# Patient Record
Sex: Male | Born: 1945 | Race: Black or African American | Hispanic: No | Marital: Married | Smoking: Former smoker
Health system: Southern US, Community
[De-identification: ages and names within clinical notes are randomized; demographics above are authoritative.]

## PROBLEM LIST (undated history)

## (undated) DIAGNOSIS — E782 Mixed hyperlipidemia: Secondary | ICD-10-CM

## (undated) DIAGNOSIS — J40 Bronchitis, not specified as acute or chronic: Secondary | ICD-10-CM

## (undated) DIAGNOSIS — Q2112 Patent foramen ovale: Secondary | ICD-10-CM

## (undated) DIAGNOSIS — Z860100 Personal history of colon polyps, unspecified: Secondary | ICD-10-CM

## (undated) DIAGNOSIS — E119 Type 2 diabetes mellitus without complications: Secondary | ICD-10-CM

## (undated) DIAGNOSIS — I513 Intracardiac thrombosis, not elsewhere classified: Secondary | ICD-10-CM

## (undated) DIAGNOSIS — I10 Essential (primary) hypertension: Secondary | ICD-10-CM

## (undated) DIAGNOSIS — I639 Cerebral infarction, unspecified: Secondary | ICD-10-CM

## (undated) DIAGNOSIS — G4733 Obstructive sleep apnea (adult) (pediatric): Secondary | ICD-10-CM

## (undated) DIAGNOSIS — N529 Male erectile dysfunction, unspecified: Secondary | ICD-10-CM

## (undated) DIAGNOSIS — I219 Acute myocardial infarction, unspecified: Secondary | ICD-10-CM

## (undated) DIAGNOSIS — K219 Gastro-esophageal reflux disease without esophagitis: Secondary | ICD-10-CM

## (undated) DIAGNOSIS — I809 Phlebitis and thrombophlebitis of unspecified site: Secondary | ICD-10-CM

## (undated) DIAGNOSIS — R0600 Dyspnea, unspecified: Secondary | ICD-10-CM

## (undated) DIAGNOSIS — Q211 Atrial septal defect: Secondary | ICD-10-CM

## (undated) HISTORY — DX: Essential (primary) hypertension: I10

## (undated) HISTORY — DX: Personal history of colon polyps, unspecified: Z86.0100

## (undated) HISTORY — DX: Bronchitis, not specified as acute or chronic: J40

## (undated) HISTORY — DX: Gastro-esophageal reflux disease without esophagitis: K21.9

## (undated) HISTORY — DX: Type 2 diabetes mellitus without complications: E11.9

## (undated) HISTORY — DX: Obstructive sleep apnea (adult) (pediatric): G47.33

## (undated) HISTORY — DX: Phlebitis and thrombophlebitis of unspecified site: I80.9

## (undated) HISTORY — DX: Cerebral infarction, unspecified: I63.9

## (undated) HISTORY — DX: Mixed hyperlipidemia: E78.2

## (undated) HISTORY — DX: Patent foramen ovale: Q21.12

## (undated) HISTORY — DX: Male erectile dysfunction, unspecified: N52.9

## (undated) HISTORY — PX: NO PAST SURGERIES: SHX2092

## (undated) HISTORY — DX: Intracardiac thrombosis, not elsewhere classified: I51.3

## (undated) HISTORY — DX: Personal history of colonic polyps: Z86.010

## (undated) HISTORY — DX: Atrial septal defect: Q21.1

---

## 2003-12-18 ENCOUNTER — Emergency Department (HOSPITAL_COMMUNITY): Admission: EM | Admit: 2003-12-18 | Discharge: 2003-12-19 | Payer: Self-pay | Admitting: Emergency Medicine

## 2003-12-22 ENCOUNTER — Ambulatory Visit (HOSPITAL_COMMUNITY): Admission: RE | Admit: 2003-12-22 | Discharge: 2003-12-22 | Payer: Self-pay | Admitting: General Surgery

## 2005-02-01 ENCOUNTER — Emergency Department (HOSPITAL_COMMUNITY): Admission: EM | Admit: 2005-02-01 | Discharge: 2005-02-02 | Payer: Self-pay | Admitting: Emergency Medicine

## 2005-02-07 ENCOUNTER — Ambulatory Visit: Payer: Self-pay | Admitting: Family Medicine

## 2005-02-07 ENCOUNTER — Ambulatory Visit (HOSPITAL_COMMUNITY): Admission: RE | Admit: 2005-02-07 | Discharge: 2005-02-07 | Payer: Self-pay | Admitting: Family Medicine

## 2005-02-13 ENCOUNTER — Ambulatory Visit: Payer: Self-pay | Admitting: Family Medicine

## 2005-02-13 ENCOUNTER — Ambulatory Visit (HOSPITAL_COMMUNITY): Admission: RE | Admit: 2005-02-13 | Discharge: 2005-02-13 | Payer: Self-pay | Admitting: Family Medicine

## 2005-02-21 ENCOUNTER — Ambulatory Visit: Payer: Self-pay | Admitting: Family Medicine

## 2005-03-14 ENCOUNTER — Ambulatory Visit: Payer: Self-pay | Admitting: Family Medicine

## 2005-03-14 ENCOUNTER — Ambulatory Visit (HOSPITAL_COMMUNITY): Admission: RE | Admit: 2005-03-14 | Discharge: 2005-03-14 | Payer: Self-pay | Admitting: Family Medicine

## 2005-05-14 ENCOUNTER — Ambulatory Visit: Payer: Self-pay | Admitting: Family Medicine

## 2006-02-26 ENCOUNTER — Encounter (INDEPENDENT_AMBULATORY_CARE_PROVIDER_SITE_OTHER): Payer: Self-pay | Admitting: Internal Medicine

## 2006-04-28 ENCOUNTER — Encounter (INDEPENDENT_AMBULATORY_CARE_PROVIDER_SITE_OTHER): Payer: Self-pay | Admitting: Internal Medicine

## 2006-04-29 ENCOUNTER — Ambulatory Visit: Payer: Self-pay | Admitting: Internal Medicine

## 2006-04-29 LAB — CONVERTED CEMR LAB
BUN: 16 mg/dL
Basophils Absolute: 0 10*3/uL
Basophils Relative: 1 %
Creatinine, Ser: 1.17 mg/dL
Eosinophils Absolute: 0.2 10*3/uL
Glucose, Bld: 94 mg/dL
HDL: 29 mg/dL
LDL Cholesterol: 93 mg/dL
Lymphocytes Relative: 36 %
MCHC: 32.7 g/dL
MCV: 86.3 fL
Monocytes Absolute: 0.6 10*3/uL
Monocytes Relative: 7 %
Neutrophils Relative %: 54 %
RBC: 5.25 M/uL
TSH: 1.884 microintl units/mL
Total Bilirubin: 0.6 mg/dL
Total Protein: 7.6 g/dL
VLDL: 31 mg/dL

## 2006-05-27 ENCOUNTER — Ambulatory Visit: Payer: Self-pay | Admitting: Internal Medicine

## 2006-06-07 ENCOUNTER — Ambulatory Visit: Admission: RE | Admit: 2006-06-07 | Discharge: 2006-06-07 | Payer: Self-pay | Admitting: Internal Medicine

## 2006-06-12 ENCOUNTER — Ambulatory Visit: Payer: Self-pay | Admitting: Pulmonary Disease

## 2006-07-01 DIAGNOSIS — I10 Essential (primary) hypertension: Secondary | ICD-10-CM

## 2006-07-01 DIAGNOSIS — Z86718 Personal history of other venous thrombosis and embolism: Secondary | ICD-10-CM

## 2006-07-01 DIAGNOSIS — E782 Mixed hyperlipidemia: Secondary | ICD-10-CM | POA: Insufficient documentation

## 2006-07-01 DIAGNOSIS — E1149 Type 2 diabetes mellitus with other diabetic neurological complication: Secondary | ICD-10-CM | POA: Insufficient documentation

## 2006-07-03 ENCOUNTER — Encounter (INDEPENDENT_AMBULATORY_CARE_PROVIDER_SITE_OTHER): Payer: Self-pay | Admitting: Internal Medicine

## 2006-07-22 ENCOUNTER — Ambulatory Visit: Payer: Self-pay | Admitting: Internal Medicine

## 2006-07-22 LAB — CONVERTED CEMR LAB
Microalb Creat Ratio: 3.3 mg/g
Microalb, Ur: 0.29 mg/dL

## 2006-07-23 ENCOUNTER — Encounter (INDEPENDENT_AMBULATORY_CARE_PROVIDER_SITE_OTHER): Payer: Self-pay | Admitting: Internal Medicine

## 2006-07-30 ENCOUNTER — Encounter (INDEPENDENT_AMBULATORY_CARE_PROVIDER_SITE_OTHER): Payer: Self-pay | Admitting: Internal Medicine

## 2006-09-15 ENCOUNTER — Encounter (INDEPENDENT_AMBULATORY_CARE_PROVIDER_SITE_OTHER): Payer: Self-pay | Admitting: Internal Medicine

## 2006-10-21 ENCOUNTER — Ambulatory Visit: Payer: Self-pay | Admitting: Internal Medicine

## 2006-10-21 DIAGNOSIS — F528 Other sexual dysfunction not due to a substance or known physiological condition: Secondary | ICD-10-CM

## 2006-10-22 LAB — CONVERTED CEMR LAB
ALT: 27 U/L
AST: 19 U/L
Albumin: 4.5 g/dL
Alkaline Phosphatase: 46 U/L
BUN: 19 mg/dL
Basophils Absolute: 0.1 10*3/uL
Basophils Relative: 1 %
CO2: 22 meq/L
Calcium: 9.6 mg/dL
Chloride: 104 meq/L
Cholesterol: 139 mg/dL
Creatinine, Ser: 1.15 mg/dL
Eosinophils Absolute: 0.2 10*3/uL
Eosinophils Relative: 2 %
Glucose, Bld: 98 mg/dL
HCT: 44.9 %
HDL: 30 mg/dL — ABNORMAL LOW
Hemoglobin: 14.2 g/dL
LDL Cholesterol: 73 mg/dL
Lymphocytes Relative: 33 %
Lymphs Abs: 2.7 10*3/uL
MCHC: 31.6 g/dL
MCV: 88.6 fL
Monocytes Absolute: 0.6 10*3/uL
Monocytes Relative: 7 %
Neutro Abs: 4.7 10*3/uL
Neutrophils Relative %: 57 %
PSA: 0.98 ng/mL
Platelets: 223 10*3/uL
Potassium: 4.1 meq/L
RBC: 5.07 M/uL
RDW: 13 %
Sodium: 138 meq/L
Total Bilirubin: 0.5 mg/dL
Total CHOL/HDL Ratio: 4.6
Total Protein: 7.5 g/dL
Triglycerides: 178 mg/dL — ABNORMAL HIGH
VLDL: 36 mg/dL
WBC: 8.2 10*3/uL

## 2007-01-20 ENCOUNTER — Ambulatory Visit: Payer: Self-pay | Admitting: Internal Medicine

## 2007-01-20 ENCOUNTER — Encounter (INDEPENDENT_AMBULATORY_CARE_PROVIDER_SITE_OTHER): Payer: Self-pay | Admitting: *Deleted

## 2007-03-11 ENCOUNTER — Encounter (INDEPENDENT_AMBULATORY_CARE_PROVIDER_SITE_OTHER): Payer: Self-pay | Admitting: Internal Medicine

## 2007-03-12 ENCOUNTER — Encounter (INDEPENDENT_AMBULATORY_CARE_PROVIDER_SITE_OTHER): Payer: Self-pay | Admitting: Internal Medicine

## 2007-04-21 ENCOUNTER — Ambulatory Visit: Payer: Self-pay | Admitting: Internal Medicine

## 2007-04-22 ENCOUNTER — Telehealth (INDEPENDENT_AMBULATORY_CARE_PROVIDER_SITE_OTHER): Payer: Self-pay | Admitting: *Deleted

## 2007-04-27 ENCOUNTER — Ambulatory Visit: Payer: Self-pay | Admitting: Internal Medicine

## 2007-04-27 DIAGNOSIS — S335XXA Sprain of ligaments of lumbar spine, initial encounter: Secondary | ICD-10-CM

## 2007-04-29 ENCOUNTER — Ambulatory Visit: Payer: Self-pay | Admitting: Internal Medicine

## 2007-05-05 ENCOUNTER — Telehealth (INDEPENDENT_AMBULATORY_CARE_PROVIDER_SITE_OTHER): Payer: Self-pay | Admitting: *Deleted

## 2007-06-05 ENCOUNTER — Ambulatory Visit: Payer: Self-pay | Admitting: Internal Medicine

## 2007-06-07 LAB — CONVERTED CEMR LAB
Albumin: 4.5 g/dL (ref 3.5–5.2)
Alkaline Phosphatase: 46 units/L (ref 39–117)
BUN: 16 mg/dL (ref 6–23)
Basophils Absolute: 0 10*3/uL (ref 0.0–0.1)
CO2: 25 meq/L (ref 19–32)
Calcium: 9.3 mg/dL (ref 8.4–10.5)
Cholesterol: 142 mg/dL (ref 0–200)
Eosinophils Absolute: 0.2 10*3/uL (ref 0.0–0.7)
Eosinophils Relative: 2 % (ref 0–5)
Glucose, Bld: 98 mg/dL (ref 70–99)
HCT: 44.8 % (ref 39.0–52.0)
MCHC: 31.7 g/dL (ref 30.0–36.0)
Monocytes Relative: 7 % (ref 3–11)
Neutro Abs: 4.1 10*3/uL (ref 1.7–7.7)
Potassium: 4.4 meq/L (ref 3.5–5.3)
Sodium: 138 meq/L (ref 135–145)
Total Bilirubin: 0.4 mg/dL (ref 0.3–1.2)
Total Protein: 7.3 g/dL (ref 6.0–8.3)
Triglycerides: 126 mg/dL (ref <150)

## 2007-06-08 ENCOUNTER — Telehealth (INDEPENDENT_AMBULATORY_CARE_PROVIDER_SITE_OTHER): Payer: Self-pay | Admitting: *Deleted

## 2007-06-09 ENCOUNTER — Encounter (INDEPENDENT_AMBULATORY_CARE_PROVIDER_SITE_OTHER): Payer: Self-pay | Admitting: Internal Medicine

## 2007-07-21 ENCOUNTER — Ambulatory Visit: Payer: Self-pay | Admitting: Internal Medicine

## 2007-07-21 LAB — CONVERTED CEMR LAB
Hgb A1c MFr Bld: 7.4 %
Microalb Creat Ratio: 6.2 mg/g (ref 0.0–30.0)
Microalb, Ur: 1.27 mg/dL (ref 0.00–1.89)

## 2007-07-28 ENCOUNTER — Ambulatory Visit: Payer: Self-pay | Admitting: Internal Medicine

## 2007-08-11 ENCOUNTER — Ambulatory Visit: Payer: Self-pay | Admitting: Internal Medicine

## 2007-08-20 ENCOUNTER — Ambulatory Visit: Payer: Self-pay | Admitting: Internal Medicine

## 2007-09-03 ENCOUNTER — Ambulatory Visit: Payer: Self-pay | Admitting: Internal Medicine

## 2007-09-03 DIAGNOSIS — I809 Phlebitis and thrombophlebitis of unspecified site: Secondary | ICD-10-CM

## 2007-09-11 ENCOUNTER — Ambulatory Visit: Payer: Self-pay | Admitting: Internal Medicine

## 2007-09-30 ENCOUNTER — Ambulatory Visit (HOSPITAL_COMMUNITY): Admission: RE | Admit: 2007-09-30 | Discharge: 2007-09-30 | Payer: Self-pay | Admitting: Family Medicine

## 2007-10-06 ENCOUNTER — Encounter (HOSPITAL_BASED_OUTPATIENT_CLINIC_OR_DEPARTMENT_OTHER): Admission: RE | Admit: 2007-10-06 | Discharge: 2007-12-07 | Payer: Self-pay | Admitting: Surgery

## 2007-10-30 ENCOUNTER — Encounter (INDEPENDENT_AMBULATORY_CARE_PROVIDER_SITE_OTHER): Payer: Self-pay | Admitting: Internal Medicine

## 2007-11-06 ENCOUNTER — Encounter (HOSPITAL_COMMUNITY): Admission: RE | Admit: 2007-11-06 | Discharge: 2007-12-06 | Payer: Self-pay | Admitting: Oncology

## 2007-11-06 ENCOUNTER — Ambulatory Visit (HOSPITAL_COMMUNITY): Payer: Self-pay | Admitting: Oncology

## 2007-12-08 ENCOUNTER — Encounter (HOSPITAL_COMMUNITY): Admission: RE | Admit: 2007-12-08 | Discharge: 2008-01-07 | Payer: Self-pay | Admitting: Oncology

## 2008-01-01 ENCOUNTER — Ambulatory Visit (HOSPITAL_COMMUNITY): Payer: Self-pay | Admitting: Oncology

## 2008-01-29 ENCOUNTER — Encounter (HOSPITAL_COMMUNITY): Admission: RE | Admit: 2008-01-29 | Discharge: 2008-02-28 | Payer: Self-pay | Admitting: Oncology

## 2008-02-18 ENCOUNTER — Ambulatory Visit (HOSPITAL_COMMUNITY): Payer: Self-pay | Admitting: Oncology

## 2008-05-19 ENCOUNTER — Ambulatory Visit (HOSPITAL_COMMUNITY): Payer: Self-pay | Admitting: Oncology

## 2008-05-19 ENCOUNTER — Encounter (HOSPITAL_COMMUNITY): Admission: RE | Admit: 2008-05-19 | Discharge: 2008-06-18 | Payer: Self-pay | Admitting: Oncology

## 2008-11-21 ENCOUNTER — Ambulatory Visit (HOSPITAL_COMMUNITY): Payer: Self-pay | Admitting: Oncology

## 2009-05-22 ENCOUNTER — Ambulatory Visit (HOSPITAL_COMMUNITY): Payer: Self-pay | Admitting: Oncology

## 2009-11-16 ENCOUNTER — Encounter (INDEPENDENT_AMBULATORY_CARE_PROVIDER_SITE_OTHER): Payer: Self-pay | Admitting: *Deleted

## 2009-12-06 ENCOUNTER — Ambulatory Visit (HOSPITAL_COMMUNITY): Payer: Self-pay | Admitting: Oncology

## 2010-08-25 ENCOUNTER — Encounter: Payer: Self-pay | Admitting: Family Medicine

## 2010-08-26 ENCOUNTER — Encounter: Payer: Self-pay | Admitting: Family Medicine

## 2010-09-04 NOTE — Letter (Signed)
Summary: Scheduled Appointment  [facility]  [address]   [place], [place] [address]   Phone: [phone]  Fax: [phone]    November 16, 2009   Dear: Dennis Yu            DOB: 1946/07/01    I have been instructed to schedule you an appointment in our office.  Your appointment is as follows:   Date:    Dec 05, 2009   Time:     1:15 PM    Please be here 15 minutes early.   Provider:  Tana Coast, P.A.-C    Please contact the office if you need to reschedule this appointment for a more convenient time.   Thank you,    Diana Eves       [facility] Ph: [phone]   Fax: [phone]

## 2010-09-25 ENCOUNTER — Telehealth (INDEPENDENT_AMBULATORY_CARE_PROVIDER_SITE_OTHER): Payer: Self-pay | Admitting: *Deleted

## 2010-10-02 NOTE — Progress Notes (Signed)
Summary: screening tcs  Phone Note Call from Patient   Summary of Call: pt has been trying to reach DS to schedule his TCS. I told his wife Alona Bene) that I would have CM to call her back at [phone] when she gets back from her meeting. Initial call taken by: Diana Eves,  September 25, 2010 2:50 PM     Appended Document: screening tcs I called pt, no answer,lmom.

## 2010-10-08 ENCOUNTER — Encounter (INDEPENDENT_AMBULATORY_CARE_PROVIDER_SITE_OTHER): Payer: Self-pay | Admitting: *Deleted

## 2010-10-10 ENCOUNTER — Encounter: Payer: Self-pay | Admitting: Internal Medicine

## 2010-10-16 NOTE — Letter (Signed)
Summary: TCS TRIAGE  TCS TRIAGE   Imported By: Rexene Alberts 10/10/2010 14:37:30  _____________________________________________________________________  External Attachment:    Type:   Image     Comment:   External Document  Appended Document: TCS TRIAGE no metformin night before; o/w ok  Appended Document: TCS TRIAGE PATIENT WILL PICK UP INSTRUCTIONS

## 2010-10-16 NOTE — Letter (Signed)
Summary: Plan of Care, Need to Discuss  [facility]  [address]   [place], [place] [address]   Phone: [phone]  Fax: [phone]    October 08, 2010  Dennis Yu [address] [place], [place]  [address] 1945/10/17   Dear [name],   We are writing this letter to inform you of treatment plans and/or discuss your plan of care.  We have tried several times to contact you; however, we have yet to reach you.  We ask that you please contact our office for follow-up on your gastrointestinal issues.  We can  be reached at [phone] to schedule an appointment, or to speak with someone regarding your health care needs.  Please do not neglect your health.   Sincerely,    Rexene Alberts  [facility] Ph: [phone]    Fax: [phone]

## 2010-10-23 ENCOUNTER — Ambulatory Visit (HOSPITAL_COMMUNITY)
Admission: RE | Admit: 2010-10-23 | Discharge: 2010-10-23 | Disposition: A | Discharge status: Home / Self Care | Payer: Medicare Other | Source: Ambulatory Visit | Attending: Internal Medicine | Admitting: Internal Medicine

## 2010-10-23 ENCOUNTER — Other Ambulatory Visit: Payer: Self-pay | Admitting: Internal Medicine

## 2010-10-23 ENCOUNTER — Encounter: Payer: Medicare Other | Admitting: Internal Medicine

## 2010-10-23 DIAGNOSIS — D126 Benign neoplasm of colon, unspecified: Secondary | ICD-10-CM | POA: Insufficient documentation

## 2010-10-23 DIAGNOSIS — E785 Hyperlipidemia, unspecified: Secondary | ICD-10-CM | POA: Insufficient documentation

## 2010-10-23 DIAGNOSIS — K5730 Diverticulosis of large intestine without perforation or abscess without bleeding: Secondary | ICD-10-CM | POA: Insufficient documentation

## 2010-10-23 DIAGNOSIS — E119 Type 2 diabetes mellitus without complications: Secondary | ICD-10-CM | POA: Insufficient documentation

## 2010-10-23 DIAGNOSIS — Z1211 Encounter for screening for malignant neoplasm of colon: Secondary | ICD-10-CM

## 2010-10-23 LAB — GLUCOSE, CAPILLARY: Glucose-Capillary: 113 mg/dL — ABNORMAL HIGH (ref 70–99)

## 2010-10-25 ENCOUNTER — Encounter: Payer: Self-pay | Admitting: Internal Medicine

## 2010-10-30 NOTE — Op Note (Signed)
  NAME:  Dennis Yu, Dennis Yu             ACCOUNT NO.:  [id number]  MEDICAL RECORD NO.:  [id number]           PATIENT TYPE:  O  LOCATION:  DAYP                          FACILITY:  [facility]  PHYSICIAN:  R. Roetta Sessions, M.D. DATE OF BIRTH:  1946-02-24  DATE OF PROCEDURE:  10/23/2010 DATE OF DISCHARGE:                              OPERATIVE REPORT   PROCEDURE:  Colonoscopy with snare polypectomy.  INDICATIONS FOR PROCEDURE:  A 65 year old gentleman referred for his first ever screening colonoscopy with Dr. Felecia Shelling.  No lower GI tract symptoms.  No family history of polyps or colon cancer.  Colonoscopy is now being done as a screening maneuver.  Risks, benefits, limitations, alternatives, imponderables have been discussed, questions answered and please see documentation medical record.  PROCEDURE NOTE:  O2 saturation, blood pressure, pulse, respirations were monitored throughout the entire procedure.  CONSCIOUS SEDATION:  Versed 7 mg IV, Demerol 50 mg IV in divided doses.  INSTRUMENT:  Pentax video chip system.  FINDINGS:  Digital rectal exam revealed no abnormalities.  Endoscopic Findings:  The prep was suboptimal left side better on the right. Colon:  Colonic mucosa was surveyed from the rectosigmoid junction through the left transverse right colon to the appendiceal orifice, ileocecal valve/cecum.  These structures well seen photographed for the record.  From this level scope was slowly cautiously withdrawn.  All previously mentioned mucosal surfaces were again seen.  On the way end, the patient had a 1.5 cm ulcerated polyp in the mid sigmoid colon and one up in the mid descending colon on long stalks.  I engaged the polyp in the mid descending colon, resected at one pass hot snare cautery and pulled it out on the nose of the scope and went back in and resect the sigmoid polyp and taken down in similar fashion and then advanced onto the cecum.  As a colon was thoroughly examined on the  way out, the patient was noted to have also extensive left-sided diverticula. However, the remainder of colonic mucosa appeared normal.  A semi-formed stool in the left side made exam somewhat more difficult, but the colon was felt to have been adequately seen.  The scope was pulled down into the rectum.  Thorough examination of the rectal mucosa was undertaken. I attempted a retroflex, but his rectum would not hold air and rectal valve was small, but I was able to see the rectal mucosa well and observed no abnormalities.  The patient tolerated the procedure well. Cecal withdrawal time 9 minutes.  IMPRESSION: 1. Normal rectum. 2. Left-sided diverticula. 3. Large polyps in the left colon resected as described above.     Remaining of the colonic mucosa appeared normal.  RECOMMENDATIONS: 1. Diverticulosis, polyp literature provided to [name]. 2. No Naprosyn or other nonsteroidals including aspirin for 7 days. 3. Follow up on path. 4. Further recommendations to follow.     Jonathon Bellows, M.D.     RMR/MEDQ  D:  10/23/2010  T:  10/23/2010  Job:  [id number]  cc:   Tesfaye D. Felecia Shelling, MD Fax: [phone]  Electronically Signed by Lorrin Goodell M.D. on 10/30/2010 11:23:25 AM

## 2010-12-04 ENCOUNTER — Ambulatory Visit (HOSPITAL_COMMUNITY): Payer: Self-pay | Admitting: Oncology

## 2010-12-11 ENCOUNTER — Other Ambulatory Visit (HOSPITAL_COMMUNITY): Payer: Self-pay | Admitting: Oncology

## 2010-12-11 ENCOUNTER — Encounter (HOSPITAL_COMMUNITY): Payer: Medicare Other | Attending: Oncology

## 2010-12-11 DIAGNOSIS — I872 Venous insufficiency (chronic) (peripheral): Secondary | ICD-10-CM | POA: Insufficient documentation

## 2010-12-11 DIAGNOSIS — I809 Phlebitis and thrombophlebitis of unspecified site: Secondary | ICD-10-CM

## 2010-12-11 DIAGNOSIS — Z79899 Other long term (current) drug therapy: Secondary | ICD-10-CM | POA: Insufficient documentation

## 2010-12-11 DIAGNOSIS — Z86718 Personal history of other venous thrombosis and embolism: Secondary | ICD-10-CM | POA: Insufficient documentation

## 2010-12-11 DIAGNOSIS — E119 Type 2 diabetes mellitus without complications: Secondary | ICD-10-CM | POA: Insufficient documentation

## 2010-12-11 LAB — COMPREHENSIVE METABOLIC PANEL
ALT: 16 U/L (ref 0–53)
BUN: 19 mg/dL (ref 6–23)
CO2: 25 mEq/L (ref 19–32)
Calcium: 10 mg/dL (ref 8.4–10.5)
Chloride: 105 mEq/L (ref 96–112)
Sodium: 139 mEq/L (ref 135–145)
Total Bilirubin: 0.2 mg/dL — ABNORMAL LOW (ref 0.3–1.2)

## 2010-12-11 LAB — CBC
HCT: 42 % (ref 39.0–52.0)
Hemoglobin: 13.1 g/dL (ref 13.0–17.0)
MCH: 27.6 pg (ref 26.0–34.0)
MCHC: 31.2 g/dL (ref 30.0–36.0)
MCV: 88.6 fL (ref 78.0–100.0)
Platelets: 206 10*3/uL (ref 150–400)
WBC: 6.2 10*3/uL (ref 4.0–10.5)

## 2010-12-11 LAB — DIFFERENTIAL
Eosinophils Relative: 2 % (ref 0–5)
Lymphocytes Relative: 31 % (ref 12–46)

## 2010-12-14 ENCOUNTER — Encounter (HOSPITAL_COMMUNITY): Payer: Medicare Other | Admitting: Oncology

## 2010-12-14 DIAGNOSIS — I809 Phlebitis and thrombophlebitis of unspecified site: Secondary | ICD-10-CM

## 2010-12-18 NOTE — Assessment & Plan Note (Signed)
[facility]   NAME:  JOHNATHAN, HESKETT             ACCOUNT NO.:  [id number]   MEDICAL RECORD NO.:  [id number]      DATE OF BIRTH:  1945/10/03   PHYSICIAN:  Jake Shark A. Tanda Rockers, M.D. VISIT DATE:  10/20/2007                                   OFFICE VISIT   SUBJECTIVE:  Mr. Stemm is a 65 year old man who we have followed for  stasis ulceration.  During the interim we have treated him with  compression wrap and topical over-the-counter hydrocortisone cream.  He  returns for follow-up.  There has been no interim fever.  He continues  to be ambulatory.  There has been no excessive swelling or pain.   OBJECTIVE:  VITALS:  Blood pressure is 133/93, respirations 14, pulse  rate 75, temperature is 98.1, capillary blood glucose 124 mg percent.  EXTREMITIES:  Inspection of the right lower extremity shows that there  are very minuscule punctate areas with 100% re-epithelialization.  Please refer to the data entries and the photographs.  Chronic changes  of stasis are persistent.  There is no evidence of ascending infection,  cellulitis or ischemia.   ASSESSMENT:  Resolved stasis ulceration.   PLAN:  We are discharging the patient. We will reevaluate him on a  p.r.n. basis.   We have counseled the patient regarding the necessity to procure and  wear bilateral below-the-knee 30-40 mm compression.  We have given the  patient opportunity to ask questions.  He seems to understand and  expresses gratitude for having been seen in the clinic.  He is  discharged.      Harold A. Tanda Rockers, M.D.  Electronically Signed     HAN/MEDQ  D:  10/20/2007  T:  10/20/2007  Job:  [id number]   cc:   Annia Friendly. Loleta Chance, MD

## 2010-12-18 NOTE — Assessment & Plan Note (Signed)
[facility]   NAME:  Dennis Yu, Dennis Yu             ACCOUNT NO.:  [id number]   MEDICAL RECORD NO.:  [id number]      DATE OF BIRTH:  May 16, 1946   PHYSICIAN:  Jake Shark A. Tanda Rockers, M.D. 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 In the interim, we have treated him with a Profore wrap.  He  returns for followup.  There has been no excessive drainage.  There has  been moderate itching.   OBJECTIVE:  Blood pressure is 138/84, respirations 75, temperature is  98.1, capillary blood glucose is 125 mg percent.  Inspection of the  right lower extremity shows that the chronic changes of stasis  dermatitis are persistent. The edema is more well controlled as  manifested by lineal wrinkles of the leg and the symmetry of the leg is  retained.  The pedal pulse is readily palpable.  There is good capillary  refill.  There is no evidence of ischemia.   ASSESSMENT:  Markedly improved stasis with resolution of the  accompanying ulceration.   PLAN:  We will return the patient to a multilayered wrap with the  topical application of hydrocortisone for an additional week.  We  anticipate resolution of the skin changes and we will make a transition  to a compressive hose during his next visit.  We will reevaluate the  patient in 1 week.      Harold A. Tanda Rockers, M.D.  Electronically Signed     HAN/MEDQ  D:  10/13/2007  T:  10/13/2007  Job:  [id number]

## 2010-12-18 NOTE — Consult Note (Signed)
NAME:  Dennis Yu, Dennis Yu             ACCOUNT NO.:  [id number]   MEDICAL RECORD NO.:  [id number]          PATIENT TYPE:  REC   LOCATION:  FOOT                         FACILITY:  [facility]   PHYSICIAN:  Theresia Majors. Tanda Rockers, M.D.DATE OF BIRTH:  18-Jul-1946   DATE OF CONSULTATION:  10/06/2007  DATE OF DISCHARGE:                                 CONSULTATION   REASON FOR CONSULTATION:  [name] is a 65 year old  referred by Dr. Mirna Mires for evaluation of diabetic wounds involving  his right lower extremity.   IMPRESSION:  Stasis dermatitis with ulceration, right lower extremity.   RECOMMENDATIONS:  Discontinue the use of the petroleum base over-the-  counter topical antibiotics with the application of a multilayer  compression wrap followed by eventual use of over-the-counter  hydrocortisone cream.   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  He has undergone a punch biopsy  in October 2008 which failed to heal.  There was no evidence of  malignancy.  The patient was treated with what sounds like a compression  wrap on one occasion and a course of antibiotics. Since that time he has  had drainage and irritation, but there has been no fever and no  excessive pain.   PAST MEDICAL HISTORY:  Remarkable for type 2 diabetes.  He denies  previous surgery.   ALLERGIES:  He denies allergies.   CURRENT MEDICATIONS:  1. Plavix 75 mg per day.  2. Pravastatin one p.o. daily.  3. Lisinopril/hydrochlorothiazide 10/12.5 mg p.o. daily.  4. Omega fish oil one a day.  5. Tylenol p.r.n.   He denies previous surgery.   FAMILY HISTORY:  Is positive for diabetes, hypertension.   SOCIAL HISTORY:  He is married.  He has adult children who live in the  local area.  He lives with his wife. He is a Naval architect.   REVIEW OF SYSTEMS:  He is a reformed smoker.  He  denies chronic cough or  history of hemoptysis.  He denies transient visual losses or paralysis.  He specifically denies chest pain.  He has never been evaluated for  angina.  His bowel and bladder function are described as normal.  His  weight has been stable over the past year.  He is able to walk four  blocks without difficulty and negotiate a flight of stairs without  difficulty.  The remainder of his Review of Systems is negative.   PHYSICAL EXAMINATION:  GENERAL:  He is an alert, oriented man in no  acute distress.  He is in good contact with reality.  VITAL SIGNS:  Blood pressure is 135/91, respirations 16, pulse rate 79,  temperature 98.6.  Capillary blood glucose is 142 mg percent.  HEENT:  Exam is clear.  NECK:  Supple.  Trachea midline.  Thyroid is not palpable.  LUNGS:  Clear.  CARDIAC:  The heart sounds were distant.  ABDOMEN:  Soft.  EXTREMITIES:  Exam is remarkable for bilateral changes of stasis  which  are more severe on the right lower extremity with frank excoriation in  the gait area and punctate ulcerations on the medial aspect of the  distal right lower extremity.  The pedal pulses are +2 bilaterally with  good capillary refill.  There is associated bilateral edema 2+ on the  right, 1+ on the left. The patient is insensate to the signs wine stain  filament bilaterally.   An accompanying ABI performed in the wound center shows a normal ABI.   A duplex scan performed September 30, 2007, shows findings consistent  with a superficial phlebitis on the right. There was no evidence of  acute DVT.   DISCUSSION:  [name]' physical exam is consistent with stasis  dermatitis and punctate ulcerations.  There has been no history of  significant control of his edema.  This may in part explain the failure  of the biopsy site to heal.  We have recommended that we place him in  adequate compression and reevaluate him on a weekly basis and make the  changes appropriate with  his clinical changes.  We have given the  patient an opportunity to ask questions.  He seems to understand and  expresses gratitude for having been seen in the clinic and indicates  that he will be compliant.      Harold A. Tanda Rockers, M.D.  Electronically Signed     HAN/MEDQ  D:  10/06/2007  T:  10/06/2007  Job:  [id number]   cc:   Annia Friendly. Loleta Chance, MD

## 2010-12-21 NOTE — Procedures (Signed)
NAME:  EAGLE, PITTA NO.:  [id number]   MEDICAL RECORD NO.:  [id number]          PATIENT TYPE:  OUT   LOCATION:  SLEEP LAB                     FACILITY:  [facility]   PHYSICIAN:  Barbaraann Share, MD,FCCPDATE OF BIRTH:  Jun 03, 1946   DATE OF STUDY:  06/07/2006                              NOCTURNAL POLYSOMNOGRAM    REFERRING PHYSICIAN:  Dr. Erle Crocker   INDICATION FOR STUDY:  Hypersomnia with sleep apnea.   EPWORTH SLEEPINESS SCORE:  24   SLEEP ARCHITECTURE:  The patient had a total sleep time of 377 minutes with  adequate slow wave sleep and borderline quantity of REM.  Sleep onset  latency was normal, as was REM onset.  Sleep efficiency was decreased at  78%.   RESPIRATORY DATA:  The patient was found by split night protocol to have 122  obstructive events in the first 141 minutes of sleep.  This gave him a  respiratory disturbance index of 52 events per hour.  The patient had more  severe events in the supine position, and there was moderate snoring noted  throughout.  By protocol, the patient was then placed on the started ResMed  Activa nasal mask, and ultimately titrated to a final pressure of 10 cm of  water.   OXYGEN DATA:  There was O2 desaturation as low as 83% with the patient's  obstructive events, prior to the CPAP.   CARDIAC DATA:  Occasional PVCs were noted.   MOVEMENT-PARASOMNIA:  No clinically significant events were noted.   IMPRESSIONS-RECOMMENDATIONS:  Split night study reveals severe obstructive  sleep apnea with a respiratory disturbance index of 52 events per hour, and  O2 desaturation as low as 83% during the first half of the night.  The  patient was then placed on a standard ResMed Activa nasal mask and  ultimately titrated to a final pressure of 10 cm of water; with excellent  control of both events and snoring.                                            ______________________________  Barbaraann Share, MD,FCCP  Diplomate,  American Board of Sleep  Medicine    [facility]/MEDQ  D:  06/12/2006 15:06:07  T:  06/13/2006 08:18:26  Job:  [id number]   cc:   Dr. Erle Crocker

## 2010-12-21 NOTE — H&P (Signed)
NAME:  Dennis Yu, Dennis Yu                         ACCOUNT NO.:  [id number]   MEDICAL RECORD NO.:  [id number]                  PATIENT TYPE:   LOCATION:                                       FACILITY:   PHYSICIAN:  Annia Friendly. Loleta Chance, M.D.                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   CHIEF COMPLAINT:  Stomach pain.   HISTORY OF PRESENT ILLNESS:  The patient came to the office for evaluation  of stomach pain on Dec 19, 2003.  The patient was seen in the emergency room  at [facility] on Dec 18, 2003, for stomach discomfort.  History is  positive for pain located on both sides of umbilicus for period of two  weeks.  The patient had increased in severity and became constant at time of  presentation to the emergency room.  The pain was described as aching.  Vomitus occurred after leaving the emergency room x 2.  Vomitus was  described as yellow.  In the emergency room, according to the patient, he  was given an injection for pain and IV fluids.   History is negative for hyperactive bowel sounds, abdominal distention,  diarrhea, melena, dysuria, gross hematuria, hematemesis, etc.  The patient  also denied constipation.  Medical history was negative for hypertension,  diabetes, tuberculosis, cancer, sickle cell, asthma, seizure disorder.   MEDICATIONS:  Prescribed medications:  None.  The patient takes 3 aspirin  everyday.   HABITS:  Positive for former cigarette smoking x 8 months.  Habits also  negative for use of ethanol x 10 years.   PAST MEDICAL HISTORY:  Positive for hospitalization for deep vein thrombosis  at age 74 and age 13 at [facility].  Sexual transmitted disease  history is negative for gonorrhea, syphilis, herpes, and HIV infection.   FAMILY HISTORY:  Mother  living in her 57s with history of hypertension;  father living at age 10 with history of coronary artery disease and  diabetes; six brother living, ranging in age from 53 to 63, one brother with  history of congenital hearing loss, other brothers' health unknown.  Six  sisters living, ranging in age from 89 to 25, health unknown.  Three sons  living, ages 29, 64, and 68, in good health.  One daughter living at age 19  in good health.   REVIEW OF SYSTEMS:  Negative for significant weight loss, night sweats,  jaundice of sclerae, bleeding gums, edema of legs, gross hematuria, dysuria,  unexplained fever, epistaxis, hemoptysis, chronic cough, dysphagia, etc.   PHYSICAL EXAMINATION:  VITAL SIGNS: In the office, pulse 76, respirations  18, blood pressure 138/96.  GENERAL:  Middle-aged, medium-height, medium frame, alert black male in no  apparent  respiratory distress.  HEENT:  Head normocephalic.  Ears: Normal auricle.  External canal patent.  Tympanic membranes pearly grey.  Eyes: Lids negative for ptosis. Sclerae  white.  Pupils equal, round, and reactive to light.  Extraocular movements  intact.  Nose: Negative for discharge.  Mouth:  Dentition fair.  No bleeding  gums.  No oral lesions.  Posterior pharynx benign.  NECK:  Negative for lymphadenopathy or thyromegaly.  LUNGS:  Clear.  HEART:  Audible S1 and S2 without murmur.  Regular rate and rhythm.  ABDOMEN:  Slightly obese.  Hypoactive bowel sounds.  Soft.  Positive for mid  epigastric and right upper quadrant tenderness.  No palpable masses.  No  organomegaly.  GENITALIA:  Penis uncircumcised.  No penile lesion or discharge.  Scrotum  and testes without nodule or tenderness.  RECTAL:  No external lesions. Digital exam:  Prostate mildly enlarged and  smooth.  No rectal vault masses.  Stool guaiac negative.  EXTREMITIES:  No edema.  No joint swelling, no joint redness, no joint  hotness.  Palpable femoral artery bilaterally.  Palpable  dorsalis pedis  bilaterally.  NEUROLOGIC:  Alert and oriented to person, place, and time.  Cranial nerves  II-XII grossly appeared intact.   IMPRESSION:  Acute epigastric pain.   PLAN:  1. Diagnostic EGD by Dr. Maggie Schwalbe.  (The patient is in agreement for the     procedure to be performed by Dr. Maggie Schwalbe on Dec 20, 2003.  The purpose     of the procedure and its complications were discussed with the patient.     The procedure will be done as outpatient.  2. Also, the patient had been set up for ultrasound.  Labs in the office are     serum amylase and lipase.     ___________________________________________                                         Annia Friendly. Loleta Chance, M.D.   Levonne Hubert  D:  12/19/2003  T:  12/19/2003  Job:  [id number]

## 2011-04-04 ENCOUNTER — Emergency Department (HOSPITAL_COMMUNITY)
Admission: EM | Admit: 2011-04-04 | Discharge: 2011-04-04 | Disposition: A | Discharge status: Home / Self Care | Payer: Medicare Other | Source: Home / Self Care | Attending: Emergency Medicine | Admitting: Emergency Medicine

## 2011-04-04 ENCOUNTER — Encounter: Payer: Self-pay | Admitting: Emergency Medicine

## 2011-04-04 ENCOUNTER — Emergency Department (HOSPITAL_COMMUNITY): Payer: Medicare Other

## 2011-04-04 ENCOUNTER — Inpatient Hospital Stay (HOSPITAL_COMMUNITY)
Admission: AD | Admit: 2011-04-04 | Discharge: 2011-04-10 | DRG: 065 | Disposition: A | Discharge status: Rehabilitation Facility | Payer: Medicare Other | Source: Other Acute Inpatient Hospital | Attending: Neurology | Admitting: Neurology

## 2011-04-04 DIAGNOSIS — R4701 Aphasia: Secondary | ICD-10-CM | POA: Insufficient documentation

## 2011-04-04 DIAGNOSIS — G8190 Hemiplegia, unspecified affecting unspecified side: Secondary | ICD-10-CM | POA: Diagnosis present

## 2011-04-04 DIAGNOSIS — Z7901 Long term (current) use of anticoagulants: Secondary | ICD-10-CM

## 2011-04-04 DIAGNOSIS — R5381 Other malaise: Secondary | ICD-10-CM | POA: Insufficient documentation

## 2011-04-04 DIAGNOSIS — I5189 Other ill-defined heart diseases: Secondary | ICD-10-CM | POA: Diagnosis present

## 2011-04-04 DIAGNOSIS — I428 Other cardiomyopathies: Secondary | ICD-10-CM | POA: Diagnosis present

## 2011-04-04 DIAGNOSIS — R471 Dysarthria and anarthria: Secondary | ICD-10-CM | POA: Diagnosis present

## 2011-04-04 DIAGNOSIS — Z9282 Status post administration of tPA (rtPA) in a different facility within the last 24 hours prior to admission to current facility: Secondary | ICD-10-CM

## 2011-04-04 DIAGNOSIS — Z86718 Personal history of other venous thrombosis and embolism: Secondary | ICD-10-CM

## 2011-04-04 DIAGNOSIS — I809 Phlebitis and thrombophlebitis of unspecified site: Secondary | ICD-10-CM | POA: Diagnosis present

## 2011-04-04 DIAGNOSIS — I6340 Cerebral infarction due to embolism of unspecified cerebral artery: Principal | ICD-10-CM | POA: Diagnosis present

## 2011-04-04 DIAGNOSIS — E119 Type 2 diabetes mellitus without complications: Secondary | ICD-10-CM | POA: Insufficient documentation

## 2011-04-04 DIAGNOSIS — I4891 Unspecified atrial fibrillation: Secondary | ICD-10-CM | POA: Insufficient documentation

## 2011-04-04 DIAGNOSIS — Q2111 Secundum atrial septal defect: Secondary | ICD-10-CM

## 2011-04-04 DIAGNOSIS — Q211 Atrial septal defect: Secondary | ICD-10-CM

## 2011-04-04 DIAGNOSIS — E785 Hyperlipidemia, unspecified: Secondary | ICD-10-CM | POA: Diagnosis present

## 2011-04-04 DIAGNOSIS — I10 Essential (primary) hypertension: Secondary | ICD-10-CM | POA: Insufficient documentation

## 2011-04-04 DIAGNOSIS — I6350 Cerebral infarction due to unspecified occlusion or stenosis of unspecified cerebral artery: Secondary | ICD-10-CM | POA: Insufficient documentation

## 2011-04-04 DIAGNOSIS — I639 Cerebral infarction, unspecified: Secondary | ICD-10-CM

## 2011-04-04 DIAGNOSIS — Z87891 Personal history of nicotine dependence: Secondary | ICD-10-CM

## 2011-04-04 LAB — DIFFERENTIAL
Basophils Absolute: 0 10*3/uL (ref 0.0–0.1)
Basophils Relative: 0 % (ref 0–1)
Eosinophils Absolute: 0.2 10*3/uL (ref 0.0–0.7)
Eosinophils Relative: 2 % (ref 0–5)
Lymphocytes Relative: 22 % (ref 12–46)
Lymphs Abs: 2.2 10*3/uL (ref 0.7–4.0)
Monocytes Absolute: 0.7 10*3/uL (ref 0.1–1.0)
Monocytes Relative: 7 % (ref 3–12)
Neutro Abs: 6.9 10*3/uL (ref 1.7–7.7)
Neutrophils Relative %: 69 % (ref 43–77)

## 2011-04-04 LAB — COMPREHENSIVE METABOLIC PANEL
Alkaline Phosphatase: 77 U/L (ref 39–117)
BUN: 18 mg/dL (ref 6–23)
CO2: 28 mEq/L (ref 19–32)
Calcium: 9.6 mg/dL (ref 8.4–10.5)
Chloride: 104 mEq/L (ref 96–112)
GFR calc Af Amer: >60 mL/min (ref >60)
Glucose, Bld: 99 mg/dL (ref 70–99)
Sodium: 140 mEq/L (ref 135–145)

## 2011-04-04 LAB — CBC
Hemoglobin: 13.5 g/dL (ref 13.0–17.0)
MCHC: 31.6 g/dL (ref 30.0–36.0)
MCV: 88.4 fL (ref 78.0–100.0)

## 2011-04-04 LAB — CARDIAC PANEL(CRET KIN+CKTOT+MB+TROPI)
CK, MB: 6.4 ng/mL (ref 0.3–4.0)
Relative Index: 2.3 (ref 0.0–2.5)
Total CK: 277 U/L — ABNORMAL HIGH (ref 7–232)
Troponin I: <0.3 ng/mL

## 2011-04-04 MED ORDER — ALTEPLASE 100 MG IV SOLR
71.6000 mg | Freq: Once | INTRAVENOUS | Status: DC
  Administered 2011-04-04: 71.6 mg

## 2011-04-04 MED ORDER — ALTEPLASE 100 MG IV SOLR
INTRAVENOUS | Status: AC
  Administered 2011-04-04: 71.6 mg
  Filled 2011-04-04: qty 1

## 2011-04-04 MED ORDER — LABETALOL HCL 5 MG/ML IV SOLN: 20.0000 mg | Freq: Once | INTRAVENOUS | Status: DC

## 2011-04-04 MED ORDER — LABETALOL HCL 5 MG/ML IV SOLN
INTRAVENOUS | Status: AC
  Filled 2011-04-04: qty 4

## 2011-04-04 NOTE — ED Notes (Signed)
7 mg bolus of alteplase given by Kizzy Olafson, 64.6 mg dose alteplase infusing. Dose ordered by tele-neurologist Freida Busman and edp Palumbo. Dose verified by Elsie Lincoln RN and Buzzy Han RN. carelink and family at bedside.

## 2011-04-04 NOTE — ED Notes (Signed)
Per pt wife. Pt had sudden onset l side facial droop/aphasia/drooling/left arm numbness at 1750

## 2011-04-04 NOTE — ED Notes (Signed)
Called [facility] for Dr. Nicanor Alcon.  Paperwork faxed.

## 2011-04-04 NOTE — ED Provider Notes (Signed)
History   Scribed for Yusef Lamp Smitty Cords, MD, the patient was seen in room APA08/APA08. This chart was scribed by Clarita Crane. This patient's care was started at 6:31PM.  CSN: [id number] Arrival date & time: 04/04/2011  6:26 PM  Chief Complaint  Patient presents with  . Cerebrovascular Accident   The history is provided by the spouse. The history is limited by the condition of the patient.   A level 5 Caveat applies due to urgent need for intervention Dennis Yu is a 65 y.o. male who presents to the Emergency Department complaining of aphasia and severe left sided weakness. Patient's spouse notes patient was last seen normal 30 minutes ago when he was mowing the lawn and speaking with spouse normally before sudden onset of aphasia and left sided weakness. Patient is right handed. Spouses denies observing seizure like actvity. Notes patient was recently put on a new medication for a "stomach problem". Reports h/o diabetes, hypertension. Denies h/o stroke.   HPI ELEMENTS: Weakness Location: LUE and LLE Onset: 30 mins ago Duration: persistent since onset  Timing: constant   Severity: severe   Context:  as above   PAST MEDICAL HISTORY:  Past Medical History  Diagnosis Date  . Hypertension   . Diabetes mellitus   . Atrial fibrillation     PAST SURGICAL HISTORY:  History reviewed. No pertinent past surgical history.  MEDICATIONS:  Previous Medications   No medications on file    ALLERGIES:  Allergies as of 04/04/2011 - reviewed 04/27/2007  Allergen Reaction Noted  . Aspirin  04/27/2007     FAMILY HISTORY:  Family History  Problem Relation Age of Onset  . Diabetes Father      SOCIAL HISTORY: History   Social History  . Marital Status: Married    Spouse Name: N/A    Number of Children: N/A  . Years of Education: N/A   Social History Main Topics  . Smoking status: Never Smoker   . Smokeless tobacco: None  . Alcohol Use: No  . Drug Use: No  . Sexually  Active:    Other Topics Concern  . None   Social History Narrative  . None      Review of Systems  Unable to perform ROS: Other   Physical Exam  BP 163/97  Pulse 84  Resp 20  Ht 6' (1.829 m)  Wt 175 lb (79.379 kg)  BMI 23.73 kg/m2  SpO2 97%  Physical Exam  Nursing note and vitals reviewed. Constitutional: He is oriented to person, place, and time. He appears well-developed and well-nourished.       Aphasia  HENT:  Head: Normocephalic and atraumatic.  Mouth/Throat: No oropharyngeal exudate.       Unable to move left side of face.   Eyes: Conjunctivae are normal. Pupils are equal, round, and reactive to light.  Neck: Neck supple.  Cardiovascular: Normal rate, regular rhythm and normal heart sounds.  Exam reveals no gallop and no friction rub.   No murmur heard. Pulmonary/Chest: Effort normal and breath sounds normal. He has no wheezes.  Abdominal: Soft. Bowel sounds are normal. He exhibits no distension. There is no tenderness.  Musculoskeletal: Normal range of motion. He exhibits no edema.  Neurological: He is alert and oriented to person, place, and time. No sensory deficit.       5+ strength RLE and RUE. 4- strength of LUE and LLE.  Skin: Skin is warm and dry.  Psychiatric: He has a normal mood and affect.  His behavior is normal.    ED Course  Procedures  OTHER DATA REVIEWED: Nursing notes, vital signs, and past medical records reviewed. Lab results reviewed and considered Imaging results reviewed and considered  DIAGNOSTIC STUDIES:   LABS / RADIOLOGY: Results for orders placed during the hospital encounter of 04/04/11  CBC      Component Value Range   WBC 10.0  4.0 - 10.5 (K/uL)   RBC 4.83  4.22 - 5.81 (MIL/uL)   Hemoglobin 13.5  13.0 - 17.0 (g/dL)   HCT 40.9  81.1 - 91.4 (%)   MCV 88.4  78.0 - 100.0 (fL)   MCH 28.0  26.0 - 34.0 (pg)   MCHC 31.6  30.0 - 36.0 (g/dL)   RDW 78.2  95.6 - 21.3 (%)   Platelets 224  150 - 400 (K/uL)  DIFFERENTIAL       Component Value Range   Neutrophils Relative 69  43 - 77 (%)   Neutro Abs 6.9  1.7 - 7.7 (K/uL)   Lymphocytes Relative 22  12 - 46 (%)   Lymphs Abs 2.2  0.7 - 4.0 (K/uL)   Monocytes Relative 7  3 - 12 (%)   Monocytes Absolute 0.7  0.1 - 1.0 (K/uL)   Eosinophils Relative 2  0 - 5 (%)   Eosinophils Absolute 0.2  0.0 - 0.7 (K/uL)   Basophils Relative 0  0 - 1 (%)   Basophils Absolute 0.0  0.0 - 0.1 (K/uL)  COMPREHENSIVE METABOLIC PANEL      Component Value Range   Sodium 140  135 - 145 (mEq/L)   Potassium 4.3  3.5 - 5.1 (mEq/L)   Chloride 104  96 - 112 (mEq/L)   CO2 28  19 - 32 (mEq/L)   Glucose, Bld 99  70 - 99 (mg/dL)   BUN 18  6 - 23 (mg/dL)   Creatinine, Ser 0.86  0.50 - 1.35 (mg/dL)   Calcium 9.6  8.4 - 57.8 (mg/dL)   Total Protein 7.2  6.0 - 8.3 (g/dL)   Albumin 4.2  3.5 - 5.2 (g/dL)   AST 20  0 - 37 (U/L)   ALT 17  0 - 53 (U/L)   Alkaline Phosphatase 77  39 - 117 (U/L)   Total Bilirubin 0.3  0.3 - 1.2 (mg/dL)   GFR calc non Af Amer >60  >60 (mL/min)   GFR calc Af Amer >60  >60 (mL/min)  PROTIME-INR      Component Value Range   Prothrombin Time 13.8  11.6 - 15.2 (seconds)   INR 1.04  0.00 - 1.49   CARDIAC PANEL(CRET KIN+CKTOT+MB+TROPI)      Component Value Range   Total CK 277 (*) 7 - 232 (U/L)   CK, MB PENDING  0.3 - 4.0 (ng/mL)   Troponin I PENDING  <0.30 (ng/mL)   Relative Index PENDING  0.0 - 2.5   GLUCOSE, CAPILLARY      Component Value Range   Glucose-Capillary 99  70 - 99 (mg/dL)  APTT      Component Value Range   aPTT 24  24 - 37 (seconds)    Dg Chest 1 View  04/04/2011  *RADIOLOGY REPORT*  Clinical Data: Sudden onset of aphasia and left-sided weakness  CHEST - 1 VIEW  Comparison: 11/06/2007; 03/14/2005  Findings: Unchanged cardiac silhouette and mediastinal contours and given AP projection.  Minimal pulmonary venous congestion without frank evidence of pulmonary edema.  Persistant peribronchial thickening.  No focal parenchymal opacities.  No pleural  effusion or pneumothorax.  Unchanged bones.  IMPRESSION: No acute cardiopulmonary disease.  Original Report Authenticated By: Waynard Reeds, M.D.   Ct Head Wo Contrast  04/04/2011  *RADIOLOGY REPORT*  Clinical Data: Sudden onset aphasia, left-sided facial droop  CT HEAD WITHOUT CONTRAST  Technique:  Contiguous axial images were obtained from the base of the skull through the vertex without contrast.  Comparison: None.  Findings: Very mild subcortical hypodensity in the left frontal lobe (series 2/image 22), nonspecific, but possibly reflecting acute ischemia.  No mass lesion, mass effect, or midline shift.  No parenchymal hemorrhage or extra-axial fluid collection.  The visualized paranasal sinuses are essentially clear. The mastoid air cells are unopacified.  No evidence of calvarial fracture.  IMPRESSION: Very mild subcortical hypodensity in the left frontal lobe, nonspecific, but possibly reflecting acute ischemia. Consider MRI brain for further evaluation.  No evidence of parenchymal hemorrhage or extra-axial fluid collection.  Critical Value/emergent results were called by telephone at the time of interpretation on 04/04/2011  at 1845 hrs  to  Dr. Denasia Venn in the ED, who verbally acknowledged these results.  Original Report Authenticated By: Charline Bills, M.D.    PROCEDURES: CRITICAL CARE NOTE: Critical care time was provided for 30 minutes exclusive of separately billable procedures and treating other patients.  This was necessary to treat or prevent further deterioration of the following condition(s) Stroke, Aphasia, unilateral weakness which the patient had and/or had a high probability of suddenly developing. This involved direct bedside patient care, speaking with family members, review of past medical records, reviewing the results of the laboratory and diagnostic studies, consulting with other physicians, as well as evaluating the effectiveness of the therapy instituted as  described.  ED COURSE / COORDINATION OF CARE: Orders Placed This Encounter  Procedures  . Urine culture  . CT Head Wo Contrast  . DG Chest 1 View  . CBC  . Differential  . Comprehensive metabolic panel  . Urinalysis, Routine w reflex microscopic  . Protime-INR  . Cardiac panel(cret kin+cktot+mb+tropi)  . Glucose, capillary  . APTT  . ED EKG  6:32PM- Code Stroke initiated.  6:35PM- Consult complete with on-call neurologist. Recommend evaluation by Teleneurology.  6:46PM- Consult complete with radiologist regarding CT-Head. Stroke confirmed.  6:54PM- Consult complete with Dr. Thad Ranger. Dr. Thad Ranger explains need for evaluation by Teleneurology before patient can be accepted.  7:01PM- Consult complete with Dr. Freida Busman . Patient case explained and discussed and treatment plan set.  7:03PM- Teleneurology evaluation initiated.  7:29PM- Order for Alteplase-71.6mg  placed in Epic for Teleneurology doctor Dr. Freida Busman.  7:49PM- Consult complete with   MDM: Differential Diagnosis: Stroke  Date: 04/04/2011  Rate: 78  Rhythm: normal sinus rhythm  QRS Axis: normal  Intervals: normal  ST/T Wave abnormalities: ST depressions laterally  Conduction Disutrbances:none  Narrative Interpretation: lateral ischemia  Old EKG Reviewed: none available  Case d/w Dr. Richardean Chimera who accepted the patient  PLAN: Transfer to neuro ICU  CONDITION ON DISCHARGE: stable    MEDICATIONS GIVEN IN THE E.D.  Medications  alteplase (ACTIVASE) injection 71.6 mg (not administered)  alteplase (ACTIVASE) 2 MG injection (not administered)  labetalol (NORMODYNE,TRANDATE) 5 MG/ML injection (not administered)  labetalol (NORMODYNE,TRANDATE) injection 20 mg (not administered)     I personally performed the services described in this documentation, which was scribed in my presence. The recorded information has been reviewed and considered. Georgeana Oertel Smitty Cords, MD    Fabiola Mudgett Smitty Cords, MD 04/04/11 [phone]

## 2011-04-04 NOTE — ED Notes (Signed)
Pt alert perrla, 2

## 2011-04-04 NOTE — ED Notes (Signed)
NSR with occassional PVC's

## 2011-04-05 ENCOUNTER — Inpatient Hospital Stay (HOSPITAL_COMMUNITY): Payer: Medicare Other

## 2011-04-05 LAB — GLUCOSE, CAPILLARY
Glucose-Capillary: 113 mg/dL — ABNORMAL HIGH (ref 70–99)
Glucose-Capillary: 153 mg/dL — ABNORMAL HIGH (ref 70–99)
Glucose-Capillary: 158 mg/dL — ABNORMAL HIGH (ref 70–99)

## 2011-04-05 LAB — LIPID PANEL
Cholesterol: 145 mg/dL (ref 0–200)
HDL: 40 mg/dL (ref >39)
Total CHOL/HDL Ratio: 3.6 RATIO

## 2011-04-05 LAB — HEMOGLOBIN A1C
Hgb A1c MFr Bld: 6.5 % — ABNORMAL HIGH (ref <5.7)
Mean Plasma Glucose: 140 mg/dL — ABNORMAL HIGH (ref <117)

## 2011-04-05 LAB — CBC
Hemoglobin: 13.9 g/dL (ref 13.0–17.0)
RBC: 4.9 MIL/uL (ref 4.22–5.81)

## 2011-04-05 MED FILL — Alteplase For Inj 100 MG: INTRAVENOUS | Qty: 71.6 | Status: AC

## 2011-04-06 DIAGNOSIS — I517 Cardiomegaly: Secondary | ICD-10-CM

## 2011-04-06 LAB — URINALYSIS, ROUTINE W REFLEX MICROSCOPIC
Hgb urine dipstick: NEGATIVE
Leukocytes, UA: NEGATIVE
Protein, ur: NEGATIVE mg/dL
Urobilinogen, UA: 0.2 mg/dL (ref 0.0–1.0)

## 2011-04-06 LAB — CBC
MCV: 85 fL (ref 78.0–100.0)
Platelets: 190 10*3/uL (ref 150–400)
RDW: 13 % (ref 11.5–15.5)
WBC: 9.2 10*3/uL (ref 4.0–10.5)

## 2011-04-07 LAB — GLUCOSE, CAPILLARY
Glucose-Capillary: 111 mg/dL — ABNORMAL HIGH (ref 70–99)
Glucose-Capillary: 111 mg/dL — ABNORMAL HIGH (ref 70–99)
Glucose-Capillary: 101 mg/dL — ABNORMAL HIGH (ref 70–99)

## 2011-04-07 LAB — HEPARIN LEVEL (UNFRACTIONATED): Heparin Unfractionated: 0.45 IU/mL (ref 0.30–0.70)

## 2011-04-08 ENCOUNTER — Inpatient Hospital Stay (HOSPITAL_COMMUNITY): Payer: Medicare Other

## 2011-04-08 DIAGNOSIS — I69921 Dysphasia following unspecified cerebrovascular disease: Secondary | ICD-10-CM

## 2011-04-08 DIAGNOSIS — I6340 Cerebral infarction due to embolism of unspecified cerebral artery: Secondary | ICD-10-CM

## 2011-04-08 DIAGNOSIS — G8110 Spastic hemiplegia affecting unspecified side: Secondary | ICD-10-CM

## 2011-04-08 LAB — BASIC METABOLIC PANEL
Calcium: 8.9 mg/dL (ref 8.4–10.5)
GFR calc non Af Amer: >60 mL/min (ref >60)
Potassium: 3.8 mEq/L (ref 3.5–5.1)
Sodium: 137 mEq/L (ref 135–145)

## 2011-04-08 LAB — CBC
MCH: 27.8 pg (ref 26.0–34.0)
MCHC: 32.6 g/dL (ref 30.0–36.0)
Platelets: 225 10*3/uL (ref 150–400)
RBC: 4.75 MIL/uL (ref 4.22–5.81)

## 2011-04-08 LAB — GLUCOSE, CAPILLARY
Glucose-Capillary: 111 mg/dL — ABNORMAL HIGH (ref 70–99)
Glucose-Capillary: 131 mg/dL — ABNORMAL HIGH (ref 70–99)

## 2011-04-08 LAB — APTT: aPTT: 40 seconds — ABNORMAL HIGH (ref 24–37)

## 2011-04-09 LAB — GLUCOSE, CAPILLARY
Glucose-Capillary: 115 mg/dL — ABNORMAL HIGH (ref 70–99)
Glucose-Capillary: 93 mg/dL (ref 70–99)
Glucose-Capillary: 89 mg/dL (ref 70–99)

## 2011-04-09 LAB — APTT: aPTT: 76 s — ABNORMAL HIGH (ref 24–37)

## 2011-04-09 LAB — PROTIME-INR
INR: 1.18 (ref 0.00–1.49)
Prothrombin Time: 15.2 seconds (ref 11.6–15.2)

## 2011-04-09 LAB — HEPARIN LEVEL (UNFRACTIONATED): Heparin Unfractionated: 0.63 [IU]/mL (ref 0.30–0.70)

## 2011-04-10 ENCOUNTER — Inpatient Hospital Stay (HOSPITAL_COMMUNITY)
Admission: RE | Admit: 2011-04-10 | Discharge: 2011-04-23 | DRG: 945 | Disposition: A | Discharge status: Home Health | Payer: Medicare Other | Source: Other Acute Inpatient Hospital | Attending: Physical Medicine & Rehabilitation | Admitting: Physical Medicine & Rehabilitation

## 2011-04-10 DIAGNOSIS — I428 Other cardiomyopathies: Secondary | ICD-10-CM

## 2011-04-10 DIAGNOSIS — Z833 Family history of diabetes mellitus: Secondary | ICD-10-CM

## 2011-04-10 DIAGNOSIS — E785 Hyperlipidemia, unspecified: Secondary | ICD-10-CM

## 2011-04-10 DIAGNOSIS — Z91199 Patient's noncompliance with other medical treatment and regimen due to unspecified reason: Secondary | ICD-10-CM

## 2011-04-10 DIAGNOSIS — D649 Anemia, unspecified: Secondary | ICD-10-CM

## 2011-04-10 DIAGNOSIS — I5189 Other ill-defined heart diseases: Secondary | ICD-10-CM

## 2011-04-10 DIAGNOSIS — Z79899 Other long term (current) drug therapy: Secondary | ICD-10-CM

## 2011-04-10 DIAGNOSIS — K5730 Diverticulosis of large intestine without perforation or abscess without bleeding: Secondary | ICD-10-CM

## 2011-04-10 DIAGNOSIS — Q211 Atrial septal defect: Secondary | ICD-10-CM

## 2011-04-10 DIAGNOSIS — I6340 Cerebral infarction due to embolism of unspecified cerebral artery: Secondary | ICD-10-CM

## 2011-04-10 DIAGNOSIS — Z5189 Encounter for other specified aftercare: Principal | ICD-10-CM

## 2011-04-10 DIAGNOSIS — R471 Dysarthria and anarthria: Secondary | ICD-10-CM

## 2011-04-10 DIAGNOSIS — Q2111 Secundum atrial septal defect: Secondary | ICD-10-CM

## 2011-04-10 DIAGNOSIS — Z8249 Family history of ischemic heart disease and other diseases of the circulatory system: Secondary | ICD-10-CM

## 2011-04-10 DIAGNOSIS — Z860100 Personal history of colon polyps, unspecified: Secondary | ICD-10-CM

## 2011-04-10 DIAGNOSIS — Z86718 Personal history of other venous thrombosis and embolism: Secondary | ICD-10-CM

## 2011-04-10 DIAGNOSIS — I6330 Cerebral infarction due to thrombosis of unspecified cerebral artery: Secondary | ICD-10-CM

## 2011-04-10 DIAGNOSIS — Z9119 Patient's noncompliance with other medical treatment and regimen: Secondary | ICD-10-CM

## 2011-04-10 DIAGNOSIS — E119 Type 2 diabetes mellitus without complications: Secondary | ICD-10-CM

## 2011-04-10 DIAGNOSIS — G4733 Obstructive sleep apnea (adult) (pediatric): Secondary | ICD-10-CM

## 2011-04-10 DIAGNOSIS — K279 Peptic ulcer, site unspecified, unspecified as acute or chronic, without hemorrhage or perforation: Secondary | ICD-10-CM

## 2011-04-10 DIAGNOSIS — G8190 Hemiplegia, unspecified affecting unspecified side: Secondary | ICD-10-CM

## 2011-04-10 DIAGNOSIS — Z87891 Personal history of nicotine dependence: Secondary | ICD-10-CM

## 2011-04-10 DIAGNOSIS — Z7901 Long term (current) use of anticoagulants: Secondary | ICD-10-CM

## 2011-04-10 LAB — GLUCOSE, CAPILLARY
Glucose-Capillary: 105 mg/dL — ABNORMAL HIGH (ref 70–99)
Glucose-Capillary: 103 mg/dL — ABNORMAL HIGH (ref 70–99)

## 2011-04-10 LAB — PROTIME-INR
INR: 1.3 (ref 0.00–1.49)
Prothrombin Time: 16.4 seconds — ABNORMAL HIGH (ref 11.6–15.2)

## 2011-04-10 LAB — OCCULT BLOOD X 1 CARD TO LAB, STOOL: Fecal Occult Bld: NEGATIVE

## 2011-04-11 DIAGNOSIS — I6330 Cerebral infarction due to thrombosis of unspecified cerebral artery: Secondary | ICD-10-CM

## 2011-04-11 LAB — DIFFERENTIAL
Basophils Absolute: 0.1 10*3/uL (ref 0.0–0.1)
Basophils Relative: 1 % (ref 0–1)
Eosinophils Absolute: 0.6 10*3/uL (ref 0.0–0.7)
Neutro Abs: 7.2 10*3/uL (ref 1.7–7.7)
Neutrophils Relative %: 64 % (ref 43–77)

## 2011-04-11 LAB — CBC
Hemoglobin: 12.6 g/dL — ABNORMAL LOW (ref 13.0–17.0)
Platelets: 258 10*3/uL (ref 150–400)
RBC: 4.48 MIL/uL (ref 4.22–5.81)
WBC: 11.1 10*3/uL — ABNORMAL HIGH (ref 4.0–10.5)

## 2011-04-11 LAB — GLUCOSE, CAPILLARY: Glucose-Capillary: 89 mg/dL (ref 70–99)

## 2011-04-11 LAB — HEPARIN LEVEL (UNFRACTIONATED): Heparin Unfractionated: 0.24 IU/mL — ABNORMAL LOW (ref 0.30–0.70)

## 2011-04-11 LAB — COMPREHENSIVE METABOLIC PANEL
ALT: 37 U/L (ref 0–53)
AST: 27 U/L (ref 0–37)
CO2: 24 mEq/L (ref 19–32)
Calcium: 9.3 mg/dL (ref 8.4–10.5)
Creatinine, Ser: 1.09 mg/dL (ref 0.50–1.35)
GFR calc Af Amer: >60 mL/min (ref >60)
GFR calc non Af Amer: >60 mL/min (ref >60)
Sodium: 138 mEq/L (ref 135–145)
Total Protein: 6.2 g/dL (ref 6.0–8.3)

## 2011-04-11 LAB — APTT: aPTT: 41 seconds — ABNORMAL HIGH (ref 24–37)

## 2011-04-11 LAB — PROTIME-INR: Prothrombin Time: 17.6 seconds — ABNORMAL HIGH (ref 11.6–15.2)

## 2011-04-12 ENCOUNTER — Inpatient Hospital Stay (HOSPITAL_COMMUNITY): Payer: Medicare Other

## 2011-04-12 DIAGNOSIS — G8110 Spastic hemiplegia affecting unspecified side: Secondary | ICD-10-CM

## 2011-04-12 DIAGNOSIS — I6330 Cerebral infarction due to thrombosis of unspecified cerebral artery: Secondary | ICD-10-CM

## 2011-04-12 LAB — GLUCOSE, CAPILLARY
Glucose-Capillary: 105 mg/dL — ABNORMAL HIGH (ref 70–99)
Glucose-Capillary: 101 mg/dL — ABNORMAL HIGH (ref 70–99)

## 2011-04-12 LAB — PROTIME-INR
INR: 1.55 — ABNORMAL HIGH (ref 0.00–1.49)
Prothrombin Time: 18.9 seconds — ABNORMAL HIGH (ref 11.6–15.2)

## 2011-04-12 LAB — CBC
HCT: 37.3 % — ABNORMAL LOW (ref 39.0–52.0)
Hemoglobin: 12.1 g/dL — ABNORMAL LOW (ref 13.0–17.0)
MCV: 84.8 fL (ref 78.0–100.0)
RDW: 12.7 % (ref 11.5–15.5)
WBC: 10.9 10*3/uL — ABNORMAL HIGH (ref 4.0–10.5)

## 2011-04-13 DIAGNOSIS — I6330 Cerebral infarction due to thrombosis of unspecified cerebral artery: Secondary | ICD-10-CM

## 2011-04-13 LAB — HEPARIN LEVEL (UNFRACTIONATED)
Heparin Unfractionated: 0.75 IU/mL — ABNORMAL HIGH (ref 0.30–0.70)
Heparin Unfractionated: 0.56 IU/mL (ref 0.30–0.70)

## 2011-04-13 LAB — GLUCOSE, CAPILLARY: Glucose-Capillary: 134 mg/dL — ABNORMAL HIGH (ref 70–99)

## 2011-04-13 LAB — CBC
HCT: 38.4 % — ABNORMAL LOW (ref 39.0–52.0)
Hemoglobin: 12.7 g/dL — ABNORMAL LOW (ref 13.0–17.0)
MCH: 28.2 pg (ref 26.0–34.0)
MCHC: 33.1 g/dL (ref 30.0–36.0)
MCV: 85.1 fL (ref 78.0–100.0)
RDW: 12.7 % (ref 11.5–15.5)

## 2011-04-13 LAB — PROTIME-INR
INR: 1.84 — ABNORMAL HIGH (ref 0.00–1.49)
Prothrombin Time: 21.6 seconds — ABNORMAL HIGH (ref 11.6–15.2)

## 2011-04-14 LAB — HEPARIN LEVEL (UNFRACTIONATED): Heparin Unfractionated: 0.27 IU/mL — ABNORMAL LOW (ref 0.30–0.70)

## 2011-04-14 LAB — PROTIME-INR
INR: 2.16 — ABNORMAL HIGH (ref 0.00–1.49)
Prothrombin Time: 24.5 seconds — ABNORMAL HIGH (ref 11.6–15.2)

## 2011-04-14 LAB — CBC
Hemoglobin: 12.1 g/dL — ABNORMAL LOW (ref 13.0–17.0)
MCHC: 32.5 g/dL (ref 30.0–36.0)
RBC: 4.34 MIL/uL (ref 4.22–5.81)
WBC: 10.5 10*3/uL (ref 4.0–10.5)

## 2011-04-14 LAB — GLUCOSE, CAPILLARY: Glucose-Capillary: 86 mg/dL (ref 70–99)

## 2011-04-15 LAB — GLUCOSE, CAPILLARY: Glucose-Capillary: 104 mg/dL — ABNORMAL HIGH (ref 70–99)

## 2011-04-15 LAB — CBC
Hemoglobin: 12.4 g/dL — ABNORMAL LOW (ref 13.0–17.0)
MCH: 27.8 pg (ref 26.0–34.0)
RBC: 4.46 MIL/uL (ref 4.22–5.81)

## 2011-04-15 LAB — HEPARIN LEVEL (UNFRACTIONATED): Heparin Unfractionated: 0.29 IU/mL — ABNORMAL LOW (ref 0.30–0.70)

## 2011-04-16 DIAGNOSIS — G8110 Spastic hemiplegia affecting unspecified side: Secondary | ICD-10-CM

## 2011-04-16 DIAGNOSIS — I6330 Cerebral infarction due to thrombosis of unspecified cerebral artery: Secondary | ICD-10-CM

## 2011-04-16 LAB — GLUCOSE, CAPILLARY
Glucose-Capillary: 97 mg/dL (ref 70–99)
Glucose-Capillary: 87 mg/dL (ref 70–99)

## 2011-04-16 LAB — CBC
HCT: 38 % — ABNORMAL LOW (ref 39.0–52.0)
Hemoglobin: 12.4 g/dL — ABNORMAL LOW (ref 13.0–17.0)
MCHC: 32.6 g/dL (ref 30.0–36.0)
WBC: 11.2 10*3/uL — ABNORMAL HIGH (ref 4.0–10.5)

## 2011-04-16 LAB — PROTIME-INR: INR: 2.62 — ABNORMAL HIGH (ref 0.00–1.49)

## 2011-04-16 NOTE — Consult Note (Signed)
Dennis Yu NO.:  [id number]  MEDICAL RECORD NO.:  [id number]  LOCATION:  3031                         FACILITY:  [facility]  PHYSICIAN:  Pamella Pert, MD DATE OF BIRTH:  07-Feb-1946  DATE OF CONSULTATION:  04/08/2011 DATE OF DISCHARGE:                                CONSULTATION   REASON FOR CONSULTATION:  Cardiomyopathy, please evaluate mural thrombus.  HISTORY:  [name] is a 65 year old gentleman with history of hypertension and diabetes.  He has been doing well until the day of admission when he presented with acute stroke and presented within 30 minutes of his onset of symptoms.  He was given full-dose tPA for stroke and was transferred to [facility].  He underwent an echocardiographic examination and the echocardiogram revealed severe cardiomyopathy with severe LV systolic dysfunction and possibility of an apical mural thrombus.  Now he is referred to me for further cardiovascular valuation.  The patient denies any chest pain, shortness of breath, paroxysmal nocturnal dyspnea, and orthopnea prior to the presentation.  He denies any palpitations, denies any syncope or near syncope prior to the presentation.  REVIEW OF SYSTEMS:  He still has significant weakness in his left upper extremity and is unable to move the upper extremities.  He still has moderate amount of weakness in his left lower extremity.  His speech is totally improved.  He denies any headache, nausea, or vomiting.  He denies any bowel or bladder disturbances.  No history of recent GI bleed.  There is no recent weight change.  He is diabetic, blood sugars are well controlled.  Other systems are negative.  HOME MEDICATIONS: 1. Vitamin D 50,000 international units 1 p.o. q.1 week. 2. Metformin 500 mg p.o. b.i.d. 3. Legatrin PM 1 tablet p.o. nightly. 4. Gabapentin 300 mg p.o. t.i.d. 5. Omeprazole 40 mg p.o. daily. 6. Zocor 20 mg p.o.  nightly.  ALLERGIES:  No known drug allergies, although he states aspirin and Naprosyn had given him GI upset and there is history of remote GI bleed.  FAMILY HISTORY:  There is no history of premature coronary artery disease in family.  SOCIAL HISTORY:  He is married.  He lives with his wife.  He quit smoking many years ago.  He does not drink alcohol.  He does his usual routine chores at home.  PHYSICAL EXAMINATION:  GENERAL:  He is moderately built and nourished. He appears to be in no acute distress. VITAL SIGNS:  Temperature of 98.3, pulse is 77 beats per minute and regular, respirations 16, and blood pressure 120/80 mmHg. CARDIAC:  S1 and S2 are normal without any gallops or murmurs. CHEST:  Clear without any rhonchi. ABDOMEN:  Soft, nontender with bowel sounds heard in all 4 quadrants. EXTREMITIES:  Left upper and lower extremities have generalized motor weakness with inability to move his left upper extremity.  Right upper and lower extremities are normal.  He does have mild facial droop on the right. NEUROLOGIC:  He is alert and oriented x3. VASCULAR:  Carotid 3+ without any bruit.  Femoral pulses are 2 to 3+ without any bruit.  Popliteals are 3+ and dorsalis pedis is 2+ bilaterally.  His  EKG demonstrates sinus rhythm with Q-waves noted in the inferior leads suggestive of old inferior wall myocardial infarction.  He also has suggestion of left hypertrophy with repolarization abnormality. Cannot exclude lateral wall ischemia with T-wave inversions noted in V5 and V6 and I and aVL.  His cardiac marker x1 has been negative for myocardial injury.  His hemoglobin A1c was 6.5.  His lipid profile done on April 05, 2011, revealed total cholesterol 145, triglyceride 66, HDL 40, and LDL was 92. His comprehensive metabolic panel was within normal limits.  CBC was within normal limits.  His echocardiogram revealed global hypokinesis, inferior hypokinesis, ejection fraction 25%  with appearance of apical mural thrombus which has got mild calcification suggesting that this could be chronic.  I evaluated the echocardiogram myself.  CT scan of the head revealed right temporal infarct with occluded right middle cerebral artery confirmed by MRA.  Chest x-ray was within normal limits.  IMPRESSION: 1. 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 2. Mural thrombus. 3. Hypertension. 4. Diabetes mellitus, type 2, controlled. 5. Hyperlipidemia. 6. History of chronic venous ulcerations in the right leg which have     healed since 2009.  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  RECOMMENDATIONS: 1. From a cardiac standpoint, nothing much to add except for addition     of Coreg to his present regimen along with an ACE inhibitor.  I     agree that he should be on both warfarin and get his INR to     therapeutic range between 2-3 along with Lovenox until his INR is     therapeutic. 2. As far as workup for cardiomyopathy is concerned, I would recommend     that we proceed with obtaining a TEE to evaluate further cardiac     structures including the mural thrombus.  Otherwise, I would     recommend that we continue to observe him given acute stroke and     possibly he may need either cardiac catheterization to exclude     significant coronary artery disease and ischemic cardiomyopathy     given a diabetic state, otherwise outpatient stress testing can be     considered.  These recommendations will follow once his acute     stroke is issued and resolved.  Thank you for asking me to see this pleasant patient.  If you have any further questions, please do not hesitate to contact me.     Pamella Pert, MD     JRG/MEDQ  D:  04/08/2011  T:  04/08/2011  Job:  [id number]  cc:   Dennis P. Pearlean Brownie, MD Dennis D. Felecia Shelling, MD  Electronically Signed by Yates Decamp MD on 04/16/2011  11:24:57 AM

## 2011-04-17 LAB — PROTIME-INR: Prothrombin Time: 27.3 seconds — ABNORMAL HIGH (ref 11.6–15.2)

## 2011-04-17 LAB — GLUCOSE, CAPILLARY
Glucose-Capillary: 98 mg/dL (ref 70–99)
Glucose-Capillary: 151 mg/dL — ABNORMAL HIGH (ref 70–99)

## 2011-04-18 ENCOUNTER — Inpatient Hospital Stay (HOSPITAL_COMMUNITY): Payer: Medicare Other

## 2011-04-18 LAB — GLUCOSE, CAPILLARY
Glucose-Capillary: 103 mg/dL — ABNORMAL HIGH (ref 70–99)
Glucose-Capillary: 76 mg/dL (ref 70–99)
Glucose-Capillary: 152 mg/dL — ABNORMAL HIGH (ref 70–99)

## 2011-04-19 DIAGNOSIS — G8110 Spastic hemiplegia affecting unspecified side: Secondary | ICD-10-CM

## 2011-04-19 DIAGNOSIS — I6330 Cerebral infarction due to thrombosis of unspecified cerebral artery: Secondary | ICD-10-CM

## 2011-04-19 LAB — PROTIME-INR
INR: 2.57 — ABNORMAL HIGH (ref 0.00–1.49)
Prothrombin Time: 28 seconds — ABNORMAL HIGH (ref 11.6–15.2)

## 2011-04-19 LAB — GLUCOSE, CAPILLARY
Glucose-Capillary: 97 mg/dL (ref 70–99)
Glucose-Capillary: 106 mg/dL — ABNORMAL HIGH (ref 70–99)

## 2011-04-19 LAB — SEDIMENTATION RATE: Sed Rate: 7 mm/hr (ref 0–16)

## 2011-04-20 LAB — GLUCOSE, CAPILLARY: Glucose-Capillary: 102 mg/dL — ABNORMAL HIGH (ref 70–99)

## 2011-04-20 NOTE — Discharge Summary (Signed)
Dennis Yu, Dennis Yu             ACCOUNT NO.:  [id number]  MEDICAL RECORD NO.:  [id number]  LOCATION:  3031                         FACILITY:  [facility]  PHYSICIAN:  Pramod P. Pearlean Brownie, MD    DATE OF BIRTH:  [birth year]  DATE OF ADMISSION:  04/04/2011 DATE OF DISCHARGE:  04/10/2011                              DISCHARGE SUMMARY   DIAGNOSES AT TIME OF DISCHARGE: 1. 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 2. Patent foramen ovale. 3. 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 4. Mural thrombus. 5. Cardiomyopathy with ejection fraction 25%. 6. Diabetes. 7. Phlebitis.  MEDICINES AT TIME OF DISCHARGE: 1. Coreg 3.125 mg b.i.d. 2. Heparin per pharmacy protocol. 3. Coumadin per pharmacy protocol. 4. Lisinopril 2.5 mg a day. 5. Gabapentin 300 mg 3 capsules nightly. 6. Metformin 500 mg b.i.d. 7. Omeprazole 40 mg a day. 8. Simvastatin 20 mg a day. 9. Vitamin D 50,000 units every week.  STUDIES PERFORMED: 1. CT of the brain on admission showed very mild subcortical     hypodensity in the left frontal lobe, nonspecific, but possibly     reflecting acute ischemia.  No hemorrhage. 2. MRI of the brain shows posterior right opercular non-hemorrhage     moderate-sized infarct extending into the junction of the posterior     right frontal lobe. 3. MRA of the brain shows decreased number of visualized right middle     cerebral artery branch vessels consistent with acute infarct,     embolus not excluded. 4. Followup CT 24 hours after tPA shows no intracranial hemorrhage.     Right temporal lobe infarct. 5. CT of the brain on April 08, 2011, shows interval evolution of     right temporal lobe infarct.  No associated hemorrhage or infarct. 6. Chest x-ray shows no acute cardiopulmonary disease. 7. Two-D echocardiogram shows EF of 25% with severe hypokinesis of the     mid distal inferolateral, inferior and anteroseptal myocardium.     Apical akinesis.   Apical septal thrombus associated with akinetic     segment.  Transesophageal echocardiogram shows EF of 20-25% with     severe diffuse hypokinesis.  There was a medium-sized apical     anterior thrombus associated with akinetic segment.  Dyskinesis of     the apical inferior, apical septal, and apical myocardium     consistent with infarction.  No evidence of vegetation.  There was     a medium-sized patent foramen ovale with a large right to left     atrial shunt. 8. Carotid Doppler showed no obvious source of embolus. 9. Transcranial Doppler shows low normal mean flow velocities in the     majority of vessels of anterior and posterior cerebral circulation,     globally elevated pulsatility indices suggest diffuse intracranial     atherosclerosis. 10.EKG shows normal sinus rhythm.  LABORATORY STUDIES:  INR 1.3.  Protime 16.4 on the day of discharge. Heparin level 0.37.  Chemistry with glucose 100, otherwise normal.  CBC normal.  Hemoglobin A1c 6.5.  Fecal occult blood positive.  C. diff negative.  Cholesterol 145, triglycerides 66, HDL 40, and LDL 92. Cardiac  enzymes with CK-MB 6.4, CK 277, troponin I less than 0.30.  HISTORY OF PRESENT ILLNESS:  Dennis Yu is a 65 year old right- handed African American male who presented at [facility] at 1840 the night of admission.  He was last seen normal by his wife at 5:30 p.m.  as he carried the grandbaby inside.  She noticed he seemed to have a  droopy face, but no weakness as he carried the baby.  She also noticed his speech was slurred.  Within 10 minutes, he noted additional left-sided weakness in the arm.  He continued to get progressively worse, that was when they decided to go to [facility].  At [facility], he was seen by Dr. Nicanor Alcon who consulted [facility] who evaluated the patient. [facility] felt the patient to be a tPA candidate and full-dose IV tPA was administered at [facility].  The patient  was then transferred to [facility] for further stroke evaluation.  Dr. Vickey Huger admitted the patient.  HOSPITAL COURSE:  The patient tolerated tPA without difficulty.  The day following, his left hemeparesis was improving.  MRI confirmed an infarct in the right temporal MCA branch.  Workup revealed cardiomyopathy with EF of 25% in a patient with no known cardiac history.  Two-D echocardiogram also revealed apical akinesis with apical septal thrombosis.  The patient was placed on IV heparin and Coumadin for secondary stroke prevention.  The patient also has vascular risk factor of diabetes.  Dr. Jacinto Halim was consulted for cardiac issues.  He performed a TEE which confirmed the apical thrombus as well as demonstrated a patent foramen ovale.  He had additional medicines for his cardiomyopathy.  He plans to do further workup as an outpatient to further clarify the cardiomyopathy.  The patient was evaluated by PT, OT, and Speech Therapy.  All agreed inpatient rehab was appropriate for ongoing therapies.  The patient was able to tolerate a dysphasia 1 thin liquid diet and was placed on a carb- modified medium diet.  CONDITION AT TIME OF DISCHARGE:  The patient is alert and oriented x3. Eye movements are intact.  Face is symmetric.  Visual fields are full. His left upper extremity is flaccid.  His left lower extremity is 3-4/5. His heart rate is regular.  His breath sounds are clear.  DISCHARGE PLAN: 1. Transfer to rehab for ongoing PT, OT, and speech therapy. 2. Pharmacy to manage ongoing IV heparin with transition to Coumadin     with goal INR 2.0-3.0 for secondary stroke prevention. 3. Ongoing risk factor control. 4. Follow up with Dr. Pearlean Brownie in his office in 2 months. 5. Follow up with primary care physician in 1 month.     Annie Main, N.P.   ______________________________ Sunny Schlein. Pearlean Brownie, MD    SB/MEDQ  D:  04/10/2011  T:  04/10/2011  Job:  [id number]  cc:   Purcell Nails,  MD Pamella Pert, MD  Electronically Signed by Annie Main N.P. on 04/11/2011 12:14:25 PM Electronically Signed by Delia Heady MD on 04/20/2011 04:13:11 PM

## 2011-04-21 LAB — GLUCOSE, CAPILLARY: Glucose-Capillary: 80 mg/dL (ref 70–99)

## 2011-04-22 DIAGNOSIS — I6330 Cerebral infarction due to thrombosis of unspecified cerebral artery: Secondary | ICD-10-CM

## 2011-04-22 DIAGNOSIS — G8110 Spastic hemiplegia affecting unspecified side: Secondary | ICD-10-CM

## 2011-04-22 LAB — GLUCOSE, CAPILLARY
Glucose-Capillary: 85 mg/dL (ref 70–99)
Glucose-Capillary: 88 mg/dL (ref 70–99)

## 2011-04-22 LAB — PROTIME-INR: Prothrombin Time: 34.2 seconds — ABNORMAL HIGH (ref 11.6–15.2)

## 2011-04-22 LAB — ANA: Anti Nuclear Antibody(ANA): NEGATIVE

## 2011-04-23 DIAGNOSIS — G8110 Spastic hemiplegia affecting unspecified side: Secondary | ICD-10-CM

## 2011-04-23 DIAGNOSIS — I6330 Cerebral infarction due to thrombosis of unspecified cerebral artery: Secondary | ICD-10-CM

## 2011-04-23 LAB — GLUCOSE, CAPILLARY
Glucose-Capillary: 99 mg/dL (ref 70–99)
Glucose-Capillary: 96 mg/dL (ref 70–99)

## 2011-04-23 LAB — PROTIME-INR: INR: 2.88 — ABNORMAL HIGH (ref 0.00–1.49)

## 2011-04-24 NOTE — H&P (Signed)
NAMEMarland Kitchen  Dennis Yu, Dennis Yu NO.:  [id number]  MEDICAL RECORD NO.:  [id number]  LOCATION:  3114                         FACILITY:  [facility]  PHYSICIAN:  Melvyn Novas, M.D.  DATE OF BIRTH:  06/24/1946  DATE OF ADMISSION:  04/04/2011 DATE OF DISCHARGE:                             HISTORY & PHYSICAL   This is a code stroke patient who presented to Dennis Yu today at about 1800 hours and 40 minutes.  He was evaluated by Dr. Nicanor Alcon in the emergency room at [facility] and she involved my colleague, Dr. Thana Farr who asked her to call the telestroke team.  Apparently a contact with a telestroke team was made at around 7:30 tonight and it was decided to give the patient a full-dose t-PA at 1900 hours and 48 minutes, a total dose measured 71.6 mg.  Dennis Yu is a 65 year old African American right-handed gentleman whose wife noticed today changes in his speech at about 5:30 when her husband carried the grand baby inside which had fallen asleep in the garden.  She states that he carried her and she noticed that he seemed to have had a droopy face. She also noted that his speech was slurred and while her husband denied that anything was wrong he had no pain, no nausea, no symptoms, she urged him to seek medical attention.  It was within 10 minutes or so that he noticed left-sided numbness in the arm but not weakness as he was able to carry the baby inside.  She mentioned that he walked just fine and that his speech seemed to get worse progressively.  When he presented at the [facility] Dr. Nicanor Alcon evaluated him as above.  PAST MEDICAL HISTORY:  The patient is followed at the cancer center at [facility] by Dr. Mariel Sleet supposedly for phlebitis.  MEDICATIONS:  According to the [facility] records none.  The wife states that she did not bring any of the medications with her today when she accompanied her husband on the way to the  emergency room, but she remembered that he is on 1 medication metformin.  She believes he was on a blood pressure medicine too but her husband denied that.  Both state that Dennis Yu is not taking aspirin nor Plavix nor heparin.  The patient states that he is allergic to aspirin but he truly only has GI upset from it.  He has never had an asthmatic or anaphylactic reaction to NSAIDs or aspirin.  FAMILY HISTORY:  Hypertension.  SOCIAL HISTORY:  The patient lives with his wife.  He is not a smoker, not a drinker and does not use illegal drugs.  She is a 65 year old well-groomed African American gentleman who presents with speech impairment and severe nausea but denies headaches.  His physical exam shows a temperature of 97.8, a blood pressure of 126/80, heart rate of 68, a respiratory rate of 15, O2 saturation of 96% on room air.  The patient's lungs are clear to auscultation.  His abdomen is soft and nontender, but the patient states that he has a feeling of spasms inside.  Lower extremities show skin changes that could be consistent with phlebitis.  Cardiovascular, regular rate and rhythm.  There is an occasional extra beat that seems to be PVCs that I see on the cardiac monitor.  His neck is supple and he does not have a bruits.  Neurologically, this patient does not present with expressive aphasia but with dysarthria and left-sided weakness, left-sided facial droop, mild tongue deviation.  No vision or visual field impairment at this time.  Left arm feels numb and there is a very slight pronator drift on the left but the grip strength seems to be equal.  The lower extremities show no difference in strength or sensory but deep tendon reflexes are symmetric.  There is an upgoing toe noted on the left side. The patient is well-nourished and well-groomed.  I reviewed the [facility] test results.  He had a normal Chem-7 and normal CBC and diff.  PT and INR were not abnormal.  I got a  cardiac panel that showed a slightly elevated CK-MB.  His chest x-ray was normal and his EKG showed a normal sinus rhythm.  He has had a brain CT that shows a hyperintensity in the left frontal lobe that was interpreted by my colleagues as the early ischemic stroke manifestation.  ASSESSMENT AND PLAN:  This patient will be admitted to the ICU 3100 for 24-hour observation.  The patient will have a CT scan of the head to follow up at 1930 hours tomorrow night.  An MRI will be obtained in the early morning.  I will write for Zofran for nausea now.  Speech therapy, occupational therapy, PT and rehab will evaluate the patient.  A transcranial Doppler, carotid Dopplers and 2-D echocardiogram are ordered.  The patient will be followed by the Stroke MD Team tomorrow. His blood pressure is under good control.  I wrote for Tylenol in case of pain treatment and for a laxative of choice.  Due to his nausea increasingly agitated and we will write possibly for a p.r.n. pain medication such as Tylenol No. 3 if pain keeps him agitated.  Discussed this plan with the wife and his attending nurse here at 3100.     Melvyn Novas, M.D.     CD/MEDQ  D:  04/04/2011  T:  04/04/2011  Job:  [id number]  cc:   Dennis Horns. Neijstrom, MD Dennis P. Pearlean Brownie, MD  Electronically Signed by Melvyn Novas M.D. on 04/24/2011 12:00:55 PM

## 2011-04-25 NOTE — H&P (Signed)
NAMENAFIS, FARNAN NO.:  [id number]  MEDICAL RECORD NO.:  [id number]  LOCATION:  4031                         FACILITY:  [facility]  PHYSICIAN:  Ranelle Oyster, M.D.DATE OF BIRTH:  1946-02-27  DATE OF ADMISSION:  04/10/2011 DATE OF DISCHARGE:                             HISTORY & PHYSICAL   CHIEF COMPLAINT:  Left-sided weakness.  PRIMARY CARE PHYSICIAN:  Purcell Nails, MD  HISTORY OF PRESENT ILLNESS:  This is a pleasant 65 year old African American male with diabetes admitted on April 04, 2011, with facial weakness and slurred speech.  History with full-dose t-PA for a left frontal hypodensity on CT.  Follow-up MRI showed posterior right periopercular infarct extending to the right frontal parietal lobe.  He had decreased number of right MCA branch vessels and embolus was not excluded.  Carotid Dopplers did not show stenosis.  Echocardiogram showed ejection fraction of 25%, severe hypokinesia and akinesis with apical septal thrombus.  He was started on heparin and Coumadin for treatment.  The patient developed increased left-sided weakness after admission.  The followup CT on April 09, 2011, showed interval evolution in the  right temporal lobe infarct.  Dr. Jacinto Halim was consulted regarding his cardiomyopathy and recommended TEE for workup.  TEE showed normal size left ventricle with global hypokinesis and apical mural thrombus.  Ejection fraction was measured at 15-20%.  A patent foramen ovale with right-to-left shunting.  He had mild dysarthria, good truncal support with therapy.  He has been following along and felt this patient could benefit from an inpatient stay.  REVIEW OF SYSTEMS:  Notable for weakness.  Denies any shortness of breath, chest pain, or coughing.  Has had some pocketing of food in his mouth.  Full 12-point review is in the written H and P.  PAST MEDICAL HISTORY:  Positive for type 2 diabetes, PUD, hypersomnia, sleep apnea,  history of DVT x2 in right lower extremity, diverticulosis, colon polyps, excised March 2012, for tubulovillous adenoma, obstructive sleep apnea, and he has been noncompliant with CPAP.  He has a history of PVD.  FAMILY HISTORY:  Positive for hypertension and diabetes.  SOCIAL HISTORY:  The patient is married, lives in two-level house, three steps to enter, 13 steps inside.  He has not smoked since 2005, and is retired [employer].  He is a caregiver for three grandchildren.  Wife works days.  ALLERGIES:  ASPIRIN, NAPROXEN which cause GI upset.  MEDICATIONS:  Please see written H and P.  LABS:  Hemoglobin 13.2, most recent white count is 10.  Sodium 140, potassium 4.3, BUN 18, creatinine 1.18.  PHYSICAL EXAMINATION:  VITAL SIGNS:  Blood pressure is 99/61, pulse is 95, respiratory rate 17, temperature 98.3. GENERAL:  The patient is pleasant and alert. HEENT:  Pupils equal, round, and reactive to light.  Nose and throat exam is notable for intact dentition and pink moist mucosa. NECK:  Supple without JVD or lymphadenopathy. CHEST:  Clear to auscultation bilaterally without wheezes, rales, or rhonchi. HEART:  Regular rate and rhythm.  Heart sounds were moderate in intensity. ABDOMEN:  Soft, nontender.  Bowel sounds are positive. SKIN:  Grossly intact. NEUROLOGIC:  Cranial nerves II-XII showed left central VII.  No significant tongue deviation.  Speech was slightly dysarthric.  No facial or oral sensory loss.  Reflexes 1+ in the left, 1+ to 2+ in the right upper and lower extremities.  The patient has no visual spatial abnormalities.  He had good attention to the left. MUSCULOSKELETAL:  Strength was grossly 2+ left deltoid, 2+ left biceps and triceps, 3- in left wrist, trace hand intrinsics and flexors, and finger flexors.  Left lower extremity is 2+ to 3 out of 5 hip flexion, 3/5 knee extension, 2/5 plantar flexion, and trace ankle dorsiflexion. Sensory exam is grossly intact  throughout the left upper and lower extremities today.  Right upper and lower extremity sensory motor exam are within normal limits.  Judgment, cognition, memory, orientation, all within functional limits.  POST ADMISSION PHYSICIAN EVALUATION: 1. Functional deficit secondary to right temporal MCA branch infarct     with evolution in the left hemiparesis and dysarthria. 2. The patient is admitted to receive collaborative interdisciplinary     care between the physiatrist rehab nursing staff and therapy team. 3. The patient's level of medical complexity and substantial therapy     needs in context of that medical necessity cannot be provided a     lesser intensity of care. 4. The patient has experienced substantial functional loss from his     baseline.  Premorbidly, the patient was independent in driving.  He     currently is min assist bed mobility, min to mod assist transfers,     total assist 70% with a rolling walker for 80 feet and mod assist     bathing and dressing.  Judging by the patient's diagnosis, physical     exam, and functional history, he has potential for functional     progress which will result in measurable gains while in inpatient     rehab.  His gains will be of substantial and practical use upon     discharge to home with facilitating mobility and self-care. 5. The physiatrist will provide 24-hour management of patient's     medical needs as well as oversight of therapy, plan/treatment and     provide guidance as appropriate regarding interaction of the two.     Medical problem list and plan are below. 6. A 24-hour rehab nursing team will assist in the management of the     patient's skin care needs as well as safety, nutrition, integration     of therapy, concepts, and techniques. 7. PT will assess and treat for lower extremity strength, range of     motion, functional mobility, neuromuscular education, adaptive     techniques, and equipment goals modified  independent supervision. 8. OT will assess and treat for upper extremity use ADLs, adaptive     techniques, equipment to neuromuscular education, upper extremity     strength, functional mobility with goals modified independent to     min assist. 9. Speech language pathology will assess and treat for communication     and speech intelligibility with goals modified independent. 10.Case management social worker will assess and treat for     psychosocial issues and discharge planning. 11.Team conference will be held weekly to assess progress towards     goals and to determine barriers at discharge. 12.The patient demonstrates sufficient medical stability and exercise     capacity to tolerate at least 3 hours therapy per day at least 5     days per week. 13.Estimated length of stay is 2-3 weeks.  Prognosis  is good.  The     patient seems motivated as well as family.  MEDICAL PROBLEM LIST AND PLAN: 1. Anticoagulation and stroke prophylaxis with Coumadin.  Follow INR     serially and look for any signs or symptoms of bleeding     complications.  Most recent hemoglobin is 13.2. 2. Pain management:  The patient is on Neurontin for questionable     neuropathy, right lower extremity although baseline need for this     is unclear at the time of this dictation. 3. Type 2 diabetes:  Follow CBGs, a.c. and h.s.  Education will be     presented to the patient and family regarding the importance of the     glycemic control.  He is on metformin b.i.d. which we will titrate     as needed. 4. Dyslipidemia:  Zocor. 5. Peptic ulcer disease:  On omeprazole.  Follow H and H as noted     above.  The patient has had positive stool guaiacs when on     Coumadin. 6. Obstructive sleep apnea.  Likely this is contributed largely to his     cardiomyopathy over the years.  We will have discussions regarding     the importance of this going forward and look at use of continuous     positive airway pressure after  discharge. 7. Cardiomyopathy:  The patient will be followed by Cardiology at a     distance.  He is on blood pressure medications to improve afterload     and cardiac contractility.  We will follow closely for tolerance of     activities for any signs and symptoms of decreased cardiac function     and output with activities.  Follow I's and O's as well as daily     weights, etc.     Ranelle Oyster, M.D.     ZTS/MEDQ  D:  04/10/2011  T:  04/10/2011  Job:  [id number]  cc:   Purcell Nails, MD Pamella Pert, MD  Electronically Signed by Faith Rogue M.D. on 04/25/2011 10:07:27 AM

## 2011-04-30 LAB — CBC
HCT: 39.6
MCHC: 34.1
Platelets: 210
RDW: 12.9

## 2011-04-30 LAB — COMPREHENSIVE METABOLIC PANEL
Albumin: 4.1
Alkaline Phosphatase: 62
BUN: 17
Calcium: 9.7
Potassium: 4.2
Total Protein: 7.1

## 2011-04-30 LAB — DIFFERENTIAL
Lymphocytes Relative: 25
Lymphs Abs: 1.9
Monocytes Absolute: 0.7
Monocytes Relative: 9
Neutro Abs: 4.8

## 2011-04-30 LAB — PSA: PSA: 0.48

## 2011-04-30 LAB — OCCULT BLOOD (STOOL CUP TO LAB): Fecal Occult Bld: NEGATIVE

## 2011-04-30 NOTE — Discharge Summary (Signed)
NAMEKENNETH, CUARESMA NO.:  [id number]  MEDICAL RECORD NO.:  [id number]  LOCATION:  4031                         FACILITY:  [facility]  PHYSICIAN:  Erick Colace, M.D.DATE OF BIRTH:  05-24-1946  DATE OF ADMISSION:  04/10/2011 DATE OF DISCHARGE:  04/23/2011                              DISCHARGE SUMMARY   DISCHARGE DIAGNOSES: 1. 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 2. Diabetes mellitus type 2. 3. Obstructive sleep apnea. 4. Cardiomyopathy with mural thrombus. 5. PA4. 6. History of obstructive sleep apnea. 7. Dyslipidemia. 8. History of peptic ulcer disease.  HISTORY OF PRESENT ILLNESS:  Mr. Ragain is a 65 year old male with history of diabetes mellitus, peptic ulcer disease admitted April 04, 2011, with facial weakness and slurred speech.  He was treated with full dose t-PA for left frontal hypodensity on CCT.  Followup MRI, MRA showedposterior left periocular moderate sized infarct extending into right frontal parietal lobe and decreased number of right MCA branch vessels. Embolus not excluded.  Carotid Dopplers done showed no ICA stenosis.  An 2-D echo done showed EF of 25% with severe hypokinesis, akinesis with apical septal thrombus.  The patient was started on heparin and Coumadin for treatment.  The patient with increased left hemiparesis past admission and followup CT of head on April 09, 2011, showed interval evolution right temporal lobe infarct.  Dr. Jacinto Halim was consulted for input on cardiomyopathy and recommended TEE for workup.  TEE done showed normal size LV mid global hypokinesis, apical mural thrombus with calcification that appeared old.  EF of 15-20% and a PFO with positive right-to-left shunting.  Cardiology recommends further followup to rule out coronary artery disease past discharge.  The patient currently continues with mild dysarthria, noted to have good truncal support at the edge of bed.   Continues with left upper greater than left lower extremity hemiparesis.  The patient was evaluated by rehab and we felt that he would benefit from a CIR program.  PAST MEDICAL HISTORY:  Positive for DM type 2, peptic ulcer disease, hypersomnia, sleep apnea history, history of DVT x2 in right lower extremity, history of diverticulosis, colon polyps excised March 2012, obstructive sleep apnea with noncompliance with CPAP.  ALLERGIES:  ASPIRIN and NAPROSYN.  REVIEW OF SYMPTOMS:  Positive for weakness.  The patient denies any shortness of breath, chest pain or coughing.  FAMILY HISTORY:  Positive for hypertension and diabetes.  SOCIAL HISTORY:  The patient is married.  He was independent and caregiver for 3 grandchildren prior to admission.  He lives in two-level home with 3 steps at entry and 13 steps inside to bedroom.  Quit tobacco in 2005 after 40 pack-year history.  He is a retired [employer].  FUNCTIONAL HISTORY:  The patient was independent and still driving prior to admission.  FUNCTIONAL STATUS:  The patient is min assist bed mobility, min to mod assist transfers plus to total assist 70% in [place] for ambulating 80 feet.  He requires mod assist for bathing and dressing.  PHYSICAL EXAMINATION:  VITALS:  Blood pressure 99/61, pulse 95, respiratory rate 17 and temperature 98.3. GENERAL:  The patient is well-nourished, well-developed male with left  facial weakness, no acute distress. HEENT:  Pupils equal, round and reactive to light.  Oral mucosa is pink and moist with intact dentition.  Hearing intact. NECK:  Supple without JVD or lymphadenopathy. LUNGS:  Clear to auscultation bilaterally without wheezes, rales or rhonchi. HEART:  Regular rate and rhythm without murmurs or gallops. ABDOMEN:  Soft and nontender with positive bowel sounds. SKIN:  Intact without breakdown. NEUROLOGIC:  Cranial nerves II through XII show left central VII.  No significant tongue deviation.   Speech slightly dysarthric.  No facial or oral sensory loss.  Reflexes 1+ in left, 1-2+ in right upper and lower extremity.  The patient has no visual facial abnormalities, good attention to left.  Strength is grossly 2+ left deltoid, 2+ left biceps and triceps, 3- left wrist traced hands into intrinsics and flexures, left lower extremity is 2+-3/5 at hip flexures, 3/5 knee extension, 2/5 plantar flexion, trace ankle dorsiflexion.  Right upper and lower extremity motor exam are within normal limits.  Sensory exam is grossly intact throughout left upper and lower extremities.  Judgment, orientation, memory and mood are all within functional limits.  HOSPITAL COURSE:  Mr. Kaden Daughdrill was admitted to rehab on April 10, 2011, for inpatient therapies to consist of PT, OT and speech therapy at least 3 hours 5 days a week.  Past admission physiatrist rehab RN and therapy team have worked together to provide customized collaborative interdisciplinary care.  The patient was maintained on Coumadin throughout his stay.  Pharmacy has been assisting with management of the patient's ProTime.  Due to the patient's history of PUD as well as diverticulosis, stool guaiacs x2 were ordered and these have been negative.  His CBC has been monitored along and has been stable with the last check of April 16, 2011, revealing hemoglobin 12.4, hematocrit 38.0, white count 11.2 and platelets 286.  The patient has had some complaints of hand pain.  RA factor checked was less than 10 and ESR was normal at 7.  ANA negative.  Lytes were checked past admission revealing sodium 138, potassium 4.1, chloride 106, CO2 24, BUN 15, creatinine 1.09 and glucose 101.  LFTs revealed AST 27, ALT 37, total protein 6.2, albumin 2.9, total bili at 0.1.  The patient's blood pressures were monitored on b.i.d. basis during his stay.  These have been reasonably controlled ranging from 100-120 systolic, 60s-70s diastolic.   The patient's CBGs were monitored with a.c. at bedtime, CBG checks.  Blood sugars have been well controlled ranging from 80s-100 range overall. The patient's p.o. intake has been good.  He has been continent of bowel and bladder.  During the patient's stay in rehab, weekly team conferences were held to monitor the patient's progress, set goals as well as discuss barriers to discharge.  At time of admission, the patient was limited by left hemiparesis with delayed balance reaction, decreased stance, poor weight shifting in all planes and decreased activity tolerance.  At admission, the patient was min assist for navigating wheelchair 150 feet with focus on sequencing and turning.  He required mod assist for standing and weight shifting.  He was able to ambulate 35 feet with total assist 25% with facilitation for lateral weight shifting and cuing for base of support.  Physical Therapy has worked with the patient on balance as well as strengthening and mobility.  He was fitted with left toe off AFO which has helped with his gait.  Currently, the patient is at supervision level for transfers, supervision level  for ambulating 160 feet with rolling walker.  He was at supervision level for navigating 5 stairs with right rail.  Family education has been done with wife to involve all mobility.  Further followup home health physical therapy to continue past discharge.  OT has worked with the patient on self-care tasks.  At admission, the patient with decreased standing balance with left posterior lean, decreased sensation on left upper extremity with decreased range of motion requiring verbal cues to incorporate left upper extremity in functional tasks.  He was at [facility] assist for bathing and dressing initially.  He is made good progress towards his goals and  is currently at supervision level for tub bench transfers.  He is modified independent for bathing and dressing.  He is incorporating  left upper extremity in functional tasks.  He does, however, required cues to increase use.  Family education has been done with wife and family members to provide supervision past discharge.  Speech Therapy has been working with the patient on dysarthria.  Currently, the patient is modified independent for basic cognitive function and requires supervision for complex tasks.  Speech is at 100% intelligibility. Family education has been done with wife and no further speech therapy needed at this time as the patient is currently at baseline cognitive level.  On April 23, 2011, the patient is discharged to home.  DISCHARGE HOME MEDICATIONS: 1. Coreg 3.125 mg p.o. b.i.d. 2. Voltaren gel to left hand q.i.d. 3. Lisinopril 5 mg p.o. per day. 4. Coumadin 5 mg 1.5 p.o. on Tuesday, Thursdays and 2 pills on Monday,     Wednesday, Friday, Saturday, Sunday. 5. Gabapentin 900 mg nightly. 6. Metformin 500 mg b.i.d. 7. Omeprazole 40 mg a day. 8. Simvastatin 20 mg a day. 9. Vitamin D 50,000 units every week.  DIET:  Diabetic diet with low salt restrictions.  Activity level is 24- hour supervision.  No strenuous activity.  SPECIAL INSTRUCTIONS:  No alcohol, no smoking, no driving.  Walk with a walker and with supervision.  [facility] to provide PT and RN.  FOLLOWUP:  The patient to follow up with Dr. Wynn Banker May 09, 2011, 10:45 for 11:15 appointment.  Follow up with Dr. Jacinto Halim for ProTime draw on May 25, 2011, at 2:15 p.m.  Follow up with Dr. Fransico Him for routine check in 2 weeks.  Follow up with Dr. Pearlean Brownie in 4-6 weeks.     Delle Reining, P.A.   ______________________________ Erick Colace, M.D.    PL/MEDQ  D:  04/23/2011  T:  04/24/2011  Job:  [id number]  cc:   Purcell Nails, MD Pamella Pert, MD Pramod P. Pearlean Brownie, MD  Electronically Signed by Osvaldo Shipper. on 04/29/2011 10:43:17 AM Electronically Signed by Claudette Laws M.D. on 04/30/2011 01:37:04  PM

## 2011-05-01 LAB — CBC
HCT: 39.2
Hemoglobin: 13.2
RBC: 4.67
WBC: 7.2

## 2011-05-01 LAB — COMPREHENSIVE METABOLIC PANEL
ALT: 30
Alkaline Phosphatase: 51
BUN: 13
Chloride: 105
Glucose, Bld: 98
Potassium: 4
Sodium: 139
Total Bilirubin: 0.4

## 2011-05-01 LAB — TSH: TSH: 1.397

## 2011-05-02 LAB — COMPREHENSIVE METABOLIC PANEL
AST: 28
Albumin: 3.8
BUN: 15
Chloride: 108
Creatinine, Ser: 1.25
GFR calc Af Amer: >60
Potassium: 3.9
Total Bilirubin: 0.5
Total Protein: 6.6

## 2011-05-02 LAB — CBC
HCT: 39.1
MCV: 84.4
Platelets: 198
RDW: 13.5
WBC: 6.3

## 2011-05-03 LAB — CBC
Hemoglobin: 13.8
MCHC: 32.8
RDW: 13.2

## 2011-05-03 LAB — COMPREHENSIVE METABOLIC PANEL
ALT: 27
Calcium: 10
Glucose, Bld: 70
Sodium: 139
Total Protein: 6.9

## 2011-05-06 LAB — COMPREHENSIVE METABOLIC PANEL
BUN: 17
CO2: 27
Chloride: 106
Creatinine, Ser: 1.2
GFR calc non Af Amer: >60
Glucose, Bld: 84
Total Bilirubin: 0.5

## 2011-05-06 LAB — CBC
HCT: 40.7
Hemoglobin: 13.4
MCV: 86
Platelets: 197
RBC: 4.73
WBC: 6.9

## 2011-05-09 ENCOUNTER — Encounter: Payer: Medicare Other | Attending: Physical Medicine & Rehabilitation

## 2011-05-09 ENCOUNTER — Inpatient Hospital Stay (HOSPITAL_BASED_OUTPATIENT_CLINIC_OR_DEPARTMENT_OTHER): Payer: Medicare Other | Admitting: Physical Medicine & Rehabilitation

## 2011-05-09 DIAGNOSIS — I69959 Hemiplegia and hemiparesis following unspecified cerebrovascular disease affecting unspecified side: Secondary | ICD-10-CM | POA: Insufficient documentation

## 2011-05-09 DIAGNOSIS — M7989 Other specified soft tissue disorders: Secondary | ICD-10-CM | POA: Insufficient documentation

## 2011-05-09 DIAGNOSIS — G8110 Spastic hemiplegia affecting unspecified side: Secondary | ICD-10-CM

## 2011-05-09 DIAGNOSIS — I69992 Facial weakness following unspecified cerebrovascular disease: Secondary | ICD-10-CM | POA: Insufficient documentation

## 2011-05-09 DIAGNOSIS — I69928 Other speech and language deficits following unspecified cerebrovascular disease: Secondary | ICD-10-CM | POA: Insufficient documentation

## 2011-05-09 NOTE — Assessment & Plan Note (Signed)
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  A 65 year old male with onset of left hemiparesis, slurred speech, facial weakness originally April 04, 2011.  Additional workup showed cardiomyopathy with mural thrombus and placed on heparin and Coumadin. He also had a PFO.  He went through inpatient rehab at [facility] April 10, 2011 to April 23, 2011.  He has had no problems since discharged home.  No falls.  He does have elevated fall risk 4/6.  He does require help with dressing, putting on his socks as well as bathing and meal prep, but is otherwise independent now with his ambulation.  He finished rehab at a supervision level with a rolling walker, now he is modified independent with a cane, but still requires an AFO in the left lower extremity.  He will follow up with Dr. Pearlean Brownie and also has an appointment with Dr. Fransico Him primary care next month or this month.  His discharge medications from the hospital include: 1. Coreg 3.125 mg p.o. b.i.d. 2. Voltaren gel to left hand q.i.d. 3. Lisinopril 5 mg daily. 4. Coumadin as per [employer]. 5. Gabapentin 900 nightly. 6. Metformin 500 b.i.d. 7. Omeprazole 40 mg daily. 8. Simvastatin 20 mg daily. 9. Vitamin D 50,000 units every week.  Social; married.  FAMILY HISTORY:  Diabetes.  His blood pressure is 140/81, pulse 85, respiratory rate is 18 and O2 sat 94% on room air.  General, no acute distress.  Mood and affect appropriate.  His right upper extremity and right lower extremity normal strength.  Left upper extremity has 4 at the deltoid, biceps, triceps and 2- in the finger flexors, extensors of the left hand.  He does have dorsum of the hand swelling, but no tenderness with MCP compression.  Left lower extremity has 2- in the ankle dorsiflexors.  Otherwise, 4- in the hip flexors, knee extensors.  His gait is with an AFO.  He has no evidence of toe drag.  He does have some  knee hyperextension, but this is not consistent.  IMPRESSION: 1. 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 2. Left dorsum of the hand swelling.  I do not think he has reflex     sympathetic dystrophy.  I do think this is more of a dependent     edema.  He is using a sling now and I have instructed him to     continue range of motion of the shoulder and take the sling off     when he is not walking.  We did discuss elevation and retrograde massage of the left upper extremity.  I will see him back in 1 month.  He maybe ready to transition to outpatient rehab at that time.     Erick Colace, M.D. Electronically Signed    AEK/MedQ D:  05/09/2011 12:26:12  T:  05/09/2011 13:00:16  Job #:  [id number]  cc:   Purcell Nails, MD Fax: [phone]  Pramod P. Pearlean Brownie, MD Fax: [phone]  Pamella Pert, MD Fax: [phone]

## 2011-05-22 ENCOUNTER — Encounter: Payer: Self-pay | Admitting: Cardiology

## 2011-05-24 ENCOUNTER — Encounter: Payer: Self-pay | Admitting: Cardiology

## 2011-05-27 ENCOUNTER — Encounter: Payer: Self-pay | Admitting: Cardiology

## 2011-05-27 ENCOUNTER — Ambulatory Visit (INDEPENDENT_AMBULATORY_CARE_PROVIDER_SITE_OTHER): Payer: Medicare Other | Admitting: Cardiology

## 2011-05-27 ENCOUNTER — Ambulatory Visit (INDEPENDENT_AMBULATORY_CARE_PROVIDER_SITE_OTHER): Payer: Medicare Other | Admitting: *Deleted

## 2011-05-27 DIAGNOSIS — I639 Cerebral infarction, unspecified: Secondary | ICD-10-CM

## 2011-05-27 DIAGNOSIS — I10 Essential (primary) hypertension: Secondary | ICD-10-CM

## 2011-05-27 DIAGNOSIS — E782 Mixed hyperlipidemia: Secondary | ICD-10-CM

## 2011-05-27 DIAGNOSIS — I2589 Other forms of chronic ischemic heart disease: Secondary | ICD-10-CM

## 2011-05-27 DIAGNOSIS — I428 Other cardiomyopathies: Secondary | ICD-10-CM

## 2011-05-27 DIAGNOSIS — I2510 Atherosclerotic heart disease of native coronary artery without angina pectoris: Secondary | ICD-10-CM | POA: Insufficient documentation

## 2011-05-27 DIAGNOSIS — I513 Intracardiac thrombosis, not elsewhere classified: Secondary | ICD-10-CM

## 2011-05-27 DIAGNOSIS — Z7901 Long term (current) use of anticoagulants: Secondary | ICD-10-CM

## 2011-05-27 DIAGNOSIS — Z86718 Personal history of other venous thrombosis and embolism: Secondary | ICD-10-CM

## 2011-05-27 DIAGNOSIS — Q211 Atrial septal defect: Secondary | ICD-10-CM

## 2011-05-27 DIAGNOSIS — I6340 Cerebral infarction due to embolism of unspecified cerebral artery: Secondary | ICD-10-CM

## 2011-05-27 DIAGNOSIS — I219 Acute myocardial infarction, unspecified: Secondary | ICD-10-CM

## 2011-05-27 DIAGNOSIS — I6350 Cerebral infarction due to unspecified occlusion or stenosis of unspecified cerebral artery: Secondary | ICD-10-CM

## 2011-05-27 LAB — POCT INR: INR: 4.9

## 2011-05-27 MED ORDER — CARVEDILOL 12.5 MG PO TABS: 12.5000 mg | ORAL_TABLET | Freq: Two times a day (BID) | ORAL | Status: DC

## 2011-05-27 NOTE — Assessment & Plan Note (Addendum)
Etiology is most likely underlying CAD and potentially prior silent infarcts. Patient denies any obvious angina and reports NYHA class II dyspnea on exertion. Medical regimen is reasonable. In light of his recent embolic infarct, would prefer to hold off invasive cardiac testing for now, perhaps 3 months, and in the interim arrange a Myoview study to assess for extent of scar and ischemia as this may help to guide future revascularization discussions. Coreg dose being advanced. Office followup arranged.

## 2011-05-27 NOTE — Assessment & Plan Note (Signed)
Now on statin therapy. LDL was 92 in August.

## 2011-05-27 NOTE — Assessment & Plan Note (Signed)
Associated with akinetic segment, now on Coumadin. Records indicate it was felt that this was the source of the patient's embolic infarct.

## 2011-05-27 NOTE — Progress Notes (Signed)
Clinical Summary Dennis Yu is a 65 y.o.male referred by Dr. Fransico Him with Dr. Felecia Shelling for cardiology evaluation. His history is fairly complicated, outlined below, including recent hospitalization with subsequent rehabilitation in September.  He was evaluated by Dr. Jacinto Halim during his hospital stay with diagnosis of cardiomyopathy, LVEF 25%, associated with wall motion abnormalities and a mural thrombus, also PFO with reported significant right to left shunt and some RV dilatation and dysfunction. This was all managed conservatively in light of his stroke. He has been on Coumadin. There was discussion in his records regarding outpatient followup for further ischemic evaluation.  Dennis Yu states that he has significant transportation difficulties, and does not feel that he will be able to get to [facility] regularly to see Dr. Jacinto Halim. He states that this is the reason he is establishing Coumadin follow up with our practice, and for that matter seeking further cardiology consultation today as well.  He reports no anginal chest pain, does not recall any definite history of prior MI or documentation of CAD. He remains functional limited following his stroke, although has had general improvement in his left-sided weakness.  I reviewed the patient's cardiac evaluation, and discussed the implications with him. Most likely etiology for his cardiomyopathy is CAD and perhaps silent infarcts over time resulting in wall motion abnormality and mural thrombus. The patient's stroke was felt to be embolic from the mural thrombus, and the finding of PFO seems to be incidental. Having said that, he is described as having a significant right to left shunt, and also increased RV size with dysfunction, which argues that this may need to be closed. No documentation of PA pressure however.   Allergies  Allergen Reactions  . Aspirin     REACTION: GI upset    Medication list reviewed.  Past Medical History  Diagnosis Date  .  Essential hypertension, benign   . Type 2 diabetes mellitus   . Mixed hyperlipidemia   . Vitamin D deficiency   . History of colonic polyps   . Erectile dysfunction   . 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 Patent foramen ovale   . Cardiomyopathy     LVEF 25%  . 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 Obstructive sleep apnea   . GERD (gastroesophageal reflux disease)   . Phlebitis     History reviewed. No pertinent past surgical history.  Family History  Problem Relation Age of Onset  . Diabetes Father   . Hypertension      Social History Dennis Yu reports that he has never smoked. He has never used smokeless tobacco. Dennis Yu reports that he does not drink alcohol.  Review of Systems No palpitations, no reported bleeding problems on Coumadin as yet. Otherwise negative except as outlined above.  Physical Examination Filed Vitals:   05/27/11 1449  BP: 149/87  Pulse: 94  Resp: 18   Normally nourished appearing male in no acute distress. HEENT: Margit Hanks and lids normal, oropharynx with moist icterus. Neck: Supple, no elevated JVP or carotid bruits. Lungs: Clear to auscultation, nonlabored. Cardiac: Regular rate and rhythm, indistinct PMI, no S3 gallop or significant murmur. Abdomen: Soft, nontender, bowel sounds present. Skin: Warm and dry. Extremities: 1+ edema bilaterally below the knees, also swelling of the left hand which is reportedly chronic. Musculoskeletal: No kyphosis. Neuropsychiatric: Alert and oriented x3. Has residual left-sided weakness, most prominent in the upper extremity    Problem List and Plan

## 2011-05-27 NOTE — Assessment & Plan Note (Signed)
Long-term history.

## 2011-05-27 NOTE — Assessment & Plan Note (Signed)
Noted at TEE with Dr. Jacinto Halim, described as medium sized with evidence of large right to left shunt. RV size was also described as mildly dilated and reduced. No PA pressure reported. He is on Coumadin at this point with recent stroke, although question remains as to whether this is ultimately needs to be repaired in light of evidence of RV dysfunction. This can be further evaluated when we ultimately consider invasive cardiovascular evaluation.

## 2011-05-27 NOTE — Patient Instructions (Signed)
**Note De-Identified Kelli Egolf Obfuscation** Your physician has requested that you have a lexiscan myoview. For further information please visit [email or web address] Please follow instruction sheet, as given.  Your physician has recommended you make the following change in your medication: increase Carvedilol to 12.5 twice daily  Your physician recommends that you schedule a follow-up appointment in: 6 weeks

## 2011-05-27 NOTE — Assessment & Plan Note (Signed)
Status post presentation in September, treated with tPA, now on Coumadin. No hemorrhagic conversion.

## 2011-06-03 ENCOUNTER — Other Ambulatory Visit: Payer: Self-pay | Admitting: *Deleted

## 2011-06-03 ENCOUNTER — Ambulatory Visit (INDEPENDENT_AMBULATORY_CARE_PROVIDER_SITE_OTHER): Payer: Medicare Other | Admitting: *Deleted

## 2011-06-03 ENCOUNTER — Other Ambulatory Visit: Payer: Self-pay | Admitting: Cardiology

## 2011-06-03 DIAGNOSIS — I6340 Cerebral infarction due to embolism of unspecified cerebral artery: Secondary | ICD-10-CM

## 2011-06-03 DIAGNOSIS — I639 Cerebral infarction, unspecified: Secondary | ICD-10-CM

## 2011-06-03 DIAGNOSIS — I6350 Cerebral infarction due to unspecified occlusion or stenosis of unspecified cerebral artery: Secondary | ICD-10-CM

## 2011-06-03 DIAGNOSIS — Z7901 Long term (current) use of anticoagulants: Secondary | ICD-10-CM

## 2011-06-03 DIAGNOSIS — Z86718 Personal history of other venous thrombosis and embolism: Secondary | ICD-10-CM

## 2011-06-03 LAB — POCT INR: INR: 2.1

## 2011-06-03 MED ORDER — LISINOPRIL 5 MG PO TABS: 5.0000 mg | ORAL_TABLET | Freq: Every day | ORAL | Status: DC

## 2011-06-04 ENCOUNTER — Encounter (HOSPITAL_COMMUNITY): Payer: Self-pay

## 2011-06-04 ENCOUNTER — Encounter (HOSPITAL_COMMUNITY)
Admission: RE | Admit: 2011-06-04 | Discharge: 2011-06-04 | Disposition: A | Discharge status: Home / Self Care | Payer: Medicare Other | Source: Ambulatory Visit | Attending: Cardiology | Admitting: Cardiology

## 2011-06-04 ENCOUNTER — Ambulatory Visit (INDEPENDENT_AMBULATORY_CARE_PROVIDER_SITE_OTHER): Payer: Medicare Other | Admitting: *Deleted

## 2011-06-04 ENCOUNTER — Encounter (HOSPITAL_COMMUNITY): Payer: Self-pay | Admitting: Cardiology

## 2011-06-04 DIAGNOSIS — I428 Other cardiomyopathies: Secondary | ICD-10-CM

## 2011-06-04 DIAGNOSIS — R079 Chest pain, unspecified: Secondary | ICD-10-CM

## 2011-06-04 DIAGNOSIS — E119 Type 2 diabetes mellitus without complications: Secondary | ICD-10-CM | POA: Insufficient documentation

## 2011-06-04 DIAGNOSIS — I10 Essential (primary) hypertension: Secondary | ICD-10-CM | POA: Insufficient documentation

## 2011-06-04 MED ORDER — TECHNETIUM TC 99M TETROFOSMIN IV KIT
10.0000 | PACK | Freq: Once | INTRAVENOUS | Status: AC | PRN
  Administered 2011-06-04: 10.5 via INTRAVENOUS

## 2011-06-04 MED ORDER — TECHNETIUM TC 99M TETROFOSMIN IV KIT
30.0000 | PACK | Freq: Once | INTRAVENOUS | Status: AC | PRN
  Administered 2011-06-04: 30 via INTRAVENOUS

## 2011-06-04 NOTE — Progress Notes (Signed)
Stress Lab Nurses Notes - [facility]  JAWANN URBANI 06/04/2011  Reason for doing test: Cardiomyopathy   Type of test: Steffanie Dunn  Nurse performing test: Parke Poisson, RN  Nuclear Medicine Tech: Lyndel Pleasure  Echo Tech: Not Applicable  MD performing test: Ival Bible & Joni Reining, NP  Family MD: Felecia Shelling  Test explained and consent signed: yes  IV started: 22g jelco, Saline lock flushed, No redness or edema and Saline lock started in radiology  Symptoms: SOB  Treatment/Intervention: None  Reason test stopped: protocol completed  After recovery IV was: Discontinued via X-ray tech and No redness or edema  Patient to return to Nuc. Med at : 12:55  Patient discharged: Home  Patient's Condition upon discharge was: stable  Comments: During test BP 132/78 & HR 98.  Recovery BP 132/80 & HR 84.  Symptoms resolved in recovery.  Erskine Speed T

## 2011-06-06 ENCOUNTER — Encounter: Payer: Medicare Other | Attending: Physical Medicine & Rehabilitation

## 2011-06-06 ENCOUNTER — Ambulatory Visit: Payer: Medicare Other | Admitting: Physical Medicine & Rehabilitation

## 2011-06-06 DIAGNOSIS — I69928 Other speech and language deficits following unspecified cerebrovascular disease: Secondary | ICD-10-CM | POA: Insufficient documentation

## 2011-06-06 DIAGNOSIS — I69992 Facial weakness following unspecified cerebrovascular disease: Secondary | ICD-10-CM | POA: Insufficient documentation

## 2011-06-06 DIAGNOSIS — I69959 Hemiplegia and hemiparesis following unspecified cerebrovascular disease affecting unspecified side: Secondary | ICD-10-CM | POA: Insufficient documentation

## 2011-06-06 DIAGNOSIS — M7989 Other specified soft tissue disorders: Secondary | ICD-10-CM | POA: Insufficient documentation

## 2011-06-13 ENCOUNTER — Ambulatory Visit (INDEPENDENT_AMBULATORY_CARE_PROVIDER_SITE_OTHER): Payer: Medicare Other | Admitting: *Deleted

## 2011-06-13 DIAGNOSIS — Z7901 Long term (current) use of anticoagulants: Secondary | ICD-10-CM

## 2011-06-13 DIAGNOSIS — Z86718 Personal history of other venous thrombosis and embolism: Secondary | ICD-10-CM

## 2011-06-13 DIAGNOSIS — I6340 Cerebral infarction due to embolism of unspecified cerebral artery: Secondary | ICD-10-CM

## 2011-06-13 DIAGNOSIS — I6350 Cerebral infarction due to unspecified occlusion or stenosis of unspecified cerebral artery: Secondary | ICD-10-CM

## 2011-06-13 DIAGNOSIS — I639 Cerebral infarction, unspecified: Secondary | ICD-10-CM

## 2011-06-13 LAB — POCT INR: INR: 2.8

## 2011-06-20 ENCOUNTER — Telehealth: Payer: Self-pay | Admitting: Cardiology

## 2011-06-20 NOTE — Telephone Encounter (Signed)
Needs letter stating that he is not able to take care of his daughter to received voucher from [facility]. / tg

## 2011-06-21 ENCOUNTER — Encounter: Payer: Self-pay | Admitting: Cardiology

## 2011-06-23 ENCOUNTER — Other Ambulatory Visit (HOSPITAL_COMMUNITY): Payer: Self-pay | Admitting: Oncology

## 2011-06-24 ENCOUNTER — Telehealth: Payer: Self-pay | Admitting: Cardiology

## 2011-06-24 ENCOUNTER — Other Ambulatory Visit: Payer: Self-pay

## 2011-06-24 MED ORDER — WARFARIN SODIUM 5 MG PO TABS: 5.0000 mg | ORAL_TABLET | ORAL | Status: DC

## 2011-06-24 NOTE — Telephone Encounter (Signed)
PT IS NEEDING A HAND WRITTEN OR PAPER RX FOR WARFRIN 5MG .

## 2011-07-04 ENCOUNTER — Ambulatory Visit (INDEPENDENT_AMBULATORY_CARE_PROVIDER_SITE_OTHER): Payer: Medicare Other | Admitting: *Deleted

## 2011-07-04 ENCOUNTER — Ambulatory Visit (INDEPENDENT_AMBULATORY_CARE_PROVIDER_SITE_OTHER): Payer: Medicare Other | Admitting: Cardiology

## 2011-07-04 ENCOUNTER — Encounter: Payer: Self-pay | Admitting: Cardiology

## 2011-07-04 DIAGNOSIS — I255 Ischemic cardiomyopathy: Secondary | ICD-10-CM

## 2011-07-04 DIAGNOSIS — Q2111 Secundum atrial septal defect: Secondary | ICD-10-CM

## 2011-07-04 DIAGNOSIS — Z7901 Long term (current) use of anticoagulants: Secondary | ICD-10-CM

## 2011-07-04 DIAGNOSIS — I6340 Cerebral infarction due to embolism of unspecified cerebral artery: Secondary | ICD-10-CM

## 2011-07-04 DIAGNOSIS — I219 Acute myocardial infarction, unspecified: Secondary | ICD-10-CM

## 2011-07-04 DIAGNOSIS — Q2112 Patent foramen ovale: Secondary | ICD-10-CM

## 2011-07-04 DIAGNOSIS — I2589 Other forms of chronic ischemic heart disease: Secondary | ICD-10-CM

## 2011-07-04 DIAGNOSIS — I513 Intracardiac thrombosis, not elsewhere classified: Secondary | ICD-10-CM

## 2011-07-04 DIAGNOSIS — Q211 Atrial septal defect: Secondary | ICD-10-CM

## 2011-07-04 DIAGNOSIS — I639 Cerebral infarction, unspecified: Secondary | ICD-10-CM

## 2011-07-04 DIAGNOSIS — I6350 Cerebral infarction due to unspecified occlusion or stenosis of unspecified cerebral artery: Secondary | ICD-10-CM

## 2011-07-04 DIAGNOSIS — Z86718 Personal history of other venous thrombosis and embolism: Secondary | ICD-10-CM

## 2011-07-04 MED ORDER — FUROSEMIDE 20 MG PO TABS: 20.0000 mg | ORAL_TABLET | ORAL | Status: DC | PRN

## 2011-07-04 NOTE — Assessment & Plan Note (Signed)
Continue Coumadin. Functionally, he seems to be doing reasonably well with some mild residual deficits.

## 2011-07-04 NOTE — Assessment & Plan Note (Signed)
Ischemic cardiomyopathy based on recent Myoview outlined above. Plan at this time will be to continue medical therapy. Lasix 20 mg will be provided on an as needed basis and we discussed obtaining daily weights to anticipate fluid gain. Ultimately, a cardiac catheterization will need to be pursued to better define the coronary anatomy and determine if any revascularization options are available. Need to wait until he is further out from his stroke, and we will tentatively consider this for the first of the year.

## 2011-07-04 NOTE — Assessment & Plan Note (Signed)
Patient continues on Coumadin, followed through the Coumadin clinic.

## 2011-07-04 NOTE — Patient Instructions (Addendum)
Your physician recommends that you schedule a follow-up appointment in: January with Dr Diona Browner  Your physician has recommended you make the following change in your medication:  Start Lasix (furosemide) - fluid pill with weight gain of 2 or more pounds daily

## 2011-07-04 NOTE — Progress Notes (Signed)
Clinical Summary Dennis Yu is a 65 y.o.male presenting for followup. I recently met him in October, history reviewed below. Followup testing was arranged with medication adjustments. Myoview results are noted below, abnormal, and consistent with an ischemic cardiomyopathy.  We discussed the results of his testing and the implications. He reports NYHA class II-III dyspnea on exertion, no definite angina. No palpitations or syncope.  He states that he tolerated the medication adjustments made at the last visit. No bleeding problems with Coumadin.  We reviewed plan for cardiac catheterization eventually to assess coronary anatomy and potential for revascularization. This is being deferred until he is further out from his stroke.   Allergies  Allergen Reactions  . Aspirin     REACTION: GI upset    Medication list reviewed.  Past Medical History  Diagnosis Date  . Essential hypertension, benign   . Type 2 diabetes mellitus   . Mixed hyperlipidemia   . Vitamin D deficiency   . History of colonic polyps   . Erectile dysfunction   . 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 Patent foramen ovale   . Cardiomyopathy     LVEF 25%  . 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 Obstructive sleep apnea   . GERD (gastroesophageal reflux disease)   . Phlebitis   . Diabetes mellitus      Family History  Problem Relation Age of Onset  . Diabetes Father   . Hypertension      Social History Dennis Yu reports that he has quit smoking. His smoking use included Cigarettes. He has never used smokeless tobacco. Dennis Yu reports that he does not drink alcohol.  Review of Systems As outlined above. No orthopnea or PND. Stable appetitie. Otherwise negative except as outlined.  Physical Examination Filed Vitals:   07/04/11 0832  BP: 127/88  Pulse: 69    Normally nourished appearing male in no acute distress.  HEENT: Conjuctiva and lids normal, oropharynx with moist mucosa.  Neck:  Supple, no elevated JVP or carotid bruits.  Lungs: Clear to auscultation, nonlabored.  Cardiac: Regular rate and rhythm, indistinct PMI, no S3 gallop or significant murmur.  Abdomen: Soft, nontender, bowel sounds present.  Skin: Warm and dry.  Extremities: 1+ edema bilaterally below the knees, also swelling of the left hand which is reportedly chronic.  Musculoskeletal: No kyphosis.  Neuropsychiatric: Alert and oriented x3. Has residual left-sided weakness, most prominent in the upper extremity   Studies Lexiscan Myoview 06/04/2011:  IMPRESSION: Abnormal Lexiscan Myoview as outlined. No diagnostic ST-segment changes noted over baseline abnormalities which are suggestive of previous inferoposterior and anterior infarcts. No arrhythmias were noted. Perfusion imaging is consistent with an ischemic cardiomyopathy with evidence of dense scar affecting the inferoposterior walls as well as moderate sized scar in the anteroseptal distribution with mild to moderate periinfarct ischemia at the apex of both anterior and inferior walls. LVEF is reduced to 28% with wall motion abnormalities.    Problem List and Plan

## 2011-07-04 NOTE — Assessment & Plan Note (Signed)
As described previously, may need to be considered for repair ultimately.

## 2011-08-01 ENCOUNTER — Ambulatory Visit (INDEPENDENT_AMBULATORY_CARE_PROVIDER_SITE_OTHER): Payer: Medicare Other | Admitting: *Deleted

## 2011-08-01 DIAGNOSIS — Z7901 Long term (current) use of anticoagulants: Secondary | ICD-10-CM

## 2011-08-01 DIAGNOSIS — I6340 Cerebral infarction due to embolism of unspecified cerebral artery: Secondary | ICD-10-CM

## 2011-08-01 DIAGNOSIS — I6350 Cerebral infarction due to unspecified occlusion or stenosis of unspecified cerebral artery: Secondary | ICD-10-CM

## 2011-08-01 DIAGNOSIS — Z86718 Personal history of other venous thrombosis and embolism: Secondary | ICD-10-CM

## 2011-08-01 DIAGNOSIS — I639 Cerebral infarction, unspecified: Secondary | ICD-10-CM

## 2011-08-29 ENCOUNTER — Encounter: Payer: Medicare Other | Admitting: *Deleted

## 2011-09-03 ENCOUNTER — Encounter: Payer: Self-pay | Admitting: *Deleted

## 2011-09-03 ENCOUNTER — Encounter: Payer: Self-pay | Admitting: Cardiology

## 2011-09-03 ENCOUNTER — Ambulatory Visit (INDEPENDENT_AMBULATORY_CARE_PROVIDER_SITE_OTHER): Payer: Medicare Other | Admitting: Cardiology

## 2011-09-03 ENCOUNTER — Ambulatory Visit (INDEPENDENT_AMBULATORY_CARE_PROVIDER_SITE_OTHER): Payer: Self-pay | Admitting: Cardiology

## 2011-09-03 ENCOUNTER — Encounter (INDEPENDENT_AMBULATORY_CARE_PROVIDER_SITE_OTHER): Payer: Medicare Other | Admitting: *Deleted

## 2011-09-03 VITALS — BP 137/89 | HR 78 | Ht 72.0 in | Wt 198.0 lb

## 2011-09-03 DIAGNOSIS — Z7901 Long term (current) use of anticoagulants: Secondary | ICD-10-CM

## 2011-09-03 DIAGNOSIS — I219 Acute myocardial infarction, unspecified: Secondary | ICD-10-CM

## 2011-09-03 DIAGNOSIS — I2510 Atherosclerotic heart disease of native coronary artery without angina pectoris: Secondary | ICD-10-CM

## 2011-09-03 DIAGNOSIS — I513 Intracardiac thrombosis, not elsewhere classified: Secondary | ICD-10-CM

## 2011-09-03 DIAGNOSIS — R0989 Other specified symptoms and signs involving the circulatory and respiratory systems: Secondary | ICD-10-CM

## 2011-09-03 DIAGNOSIS — Z86718 Personal history of other venous thrombosis and embolism: Secondary | ICD-10-CM

## 2011-09-03 DIAGNOSIS — Q211 Atrial septal defect: Secondary | ICD-10-CM

## 2011-09-03 DIAGNOSIS — I6350 Cerebral infarction due to unspecified occlusion or stenosis of unspecified cerebral artery: Secondary | ICD-10-CM

## 2011-09-03 DIAGNOSIS — I429 Cardiomyopathy, unspecified: Secondary | ICD-10-CM

## 2011-09-03 DIAGNOSIS — I639 Cerebral infarction, unspecified: Secondary | ICD-10-CM

## 2011-09-03 NOTE — Assessment & Plan Note (Signed)
Probable ischemic cardiomyopathy, suspect multivessel disease based on abnormal Myoview outlined in prior note. Dyspnea on exertion is likely a manifestation of this. He is now at a reasonable time following his stroke last year to consider invasive cardiac testing to determine appropriate revascularization strategies. We discussed this today, however he stated he did not want to pursue any invasive testing at this point, and continue medical therapy with observation.

## 2011-09-03 NOTE — Progress Notes (Signed)
Clinical Summary Dennis Yu is a 66 y.o.male presenting for followup. I saw him in November.  He returns today describing no chest pain symptoms, has NYHA class 2-3 dyspnea on exertion which has been stable. No palpitations or syncope. No reported bleeding problems on Coumadin. He states that he has been taking his medications regularly.  Today I reviewed with him my prior recommendation for proceeding to a cardiac catheterization to best assess his coronary anatomy for the potential revascularization, and also to get a sense of whether he might be able to have an ASD repair percutaneously, or surgically if he ultimately needed CABG. We reviewed his prior cardiac testing. He indicated that he had already thought about this prior to the visit, and has decided for the time being that he does not want to pursue any invasive testing, prefers medical therapy and observation.  He reports better movement of the left arm, although continued decrease in strength following his stroke. He is ambulating without use of a cane. No falls.   Allergies  Allergen Reactions  . Aspirin     REACTION: GI upset    Current Outpatient Prescriptions  Medication Sig Dispense Refill  . aspirin 81 MG tablet Take 81 mg by mouth daily.        . carvedilol (COREG) 12.5 MG tablet Take 1 tablet (12.5 mg total) by mouth 2 (two) times daily with a meal. Dose increase  60 tablet  6  . diclofenac sodium (VOLTAREN) 1 % GEL Apply topically as directed.        . ergocalciferol (VITAMIN D2) 50000 UNITS capsule Take 50,000 Units by mouth once a week.        . furosemide (LASIX) 20 MG tablet Take 1 tablet (20 mg total) by mouth as needed.  30 tablet  3  . gabapentin (NEURONTIN) 300 MG capsule TAKE THREE CAPSULES BY MOUTH EVERY DAY AT BEDTIME  90 capsule  1  . lisinopril (PRINIVIL,ZESTRIL) 5 MG tablet Take 1 tablet (5 mg total) by mouth daily.  30 tablet  3  . metFORMIN (GLUCOPHAGE) 500 MG tablet Take 500 mg by mouth 2 (two) times  daily with a meal.        . NON FORMULARY legatrin pm Daily        . omeprazole (PRILOSEC) 40 MG capsule Take 40 mg by mouth daily.        . simvastatin (ZOCOR) 20 MG tablet Take 20 mg by mouth at bedtime.        [name] warfarin (COUMADIN) 5 MG tablet Take 1 tablet (5 mg total) by mouth as directed.  45 tablet  3    Past Medical History  Diagnosis Date  . Essential hypertension, benign   . Type 2 diabetes mellitus   . Mixed hyperlipidemia   . Vitamin D deficiency   . History of colonic polyps   . Erectile dysfunction   . 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 Patent foramen ovale   . Cardiomyopathy     LVEF 25%  . 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 Obstructive sleep apnea   . GERD (gastroesophageal reflux disease)   . Phlebitis   . Diabetes mellitus     Social History Dennis Yu reports that he has quit smoking. His smoking use included Cigarettes. He has never used smokeless tobacco. Dennis Yu reports that he does not drink alcohol.  Review of Systems Negative except as outlined above.  Physical  Examination Filed Vitals:   09/03/11 0814  BP: 137/89  Pulse: 78   Normally nourished appearing male in no acute distress.  HEENT: Conjuctiva and lids normal, oropharynx with moist mucosa.  Neck: Supple, no elevated JVP or carotid bruits.  Lungs: Clear to auscultation, nonlabored.  Cardiac: Regular rate and rhythm, indistinct PMI, no S3 gallop or significant murmur.  Abdomen: Soft, nontender, bowel sounds present.  Skin: Warm and dry.  Extremities: Trace edema bilaterally below the knees, also mild chronic swelling of the left hand.  Musculoskeletal: No kyphosis.  Neuropsychiatric: Alert and oriented x3. Has residual left-sided weakness, most prominent in the upper extremity, although better in general.    Problem List and Plan

## 2011-09-03 NOTE — Assessment & Plan Note (Signed)
Most likely ischemic with evidence of scar and peri-infarct ischemia based on previous Myoview, LVEF 28%. He indicates preference to avoid invasive testing, which would include ICD at this time, and therefore we will continue medical therapy. I have discussed with him the risk-benefit profile of this decision.

## 2011-09-03 NOTE — Assessment & Plan Note (Signed)
Continues on Coumadin. 

## 2011-09-03 NOTE — Assessment & Plan Note (Signed)
Medium sized with evidence of large right to left shunt. RV size was also described as mildly dilated and reduced. No PA pressure reported. He is on Coumadin at this point with concurrent left ventricular mural thrombus. I have discussed with him further assessment for potential repair, either percutaneous or surgical, in conjunction with assessment of his coronaries. As already discussed, he does not want to pursue any further invasive evaluation at this time.

## 2011-09-03 NOTE — Patient Instructions (Addendum)
Do not take your Coumadin dose today.  Your physician recommends that you schedule a follow-up appointment in: 2 months

## 2011-10-03 ENCOUNTER — Ambulatory Visit (INDEPENDENT_AMBULATORY_CARE_PROVIDER_SITE_OTHER): Payer: Medicare Other | Admitting: *Deleted

## 2011-10-03 DIAGNOSIS — Z7901 Long term (current) use of anticoagulants: Secondary | ICD-10-CM

## 2011-10-03 DIAGNOSIS — I639 Cerebral infarction, unspecified: Secondary | ICD-10-CM

## 2011-10-03 DIAGNOSIS — I6350 Cerebral infarction due to unspecified occlusion or stenosis of unspecified cerebral artery: Secondary | ICD-10-CM

## 2011-10-03 DIAGNOSIS — Z86718 Personal history of other venous thrombosis and embolism: Secondary | ICD-10-CM

## 2011-10-03 DIAGNOSIS — I6340 Cerebral infarction due to embolism of unspecified cerebral artery: Secondary | ICD-10-CM

## 2011-10-03 MED ORDER — LISINOPRIL 5 MG PO TABS: 5.0000 mg | ORAL_TABLET | Freq: Every day | ORAL | Status: DC

## 2011-10-03 MED ORDER — WARFARIN SODIUM 5 MG PO TABS: 5.0000 mg | ORAL_TABLET | ORAL | Status: DC

## 2011-10-28 ENCOUNTER — Ambulatory Visit (INDEPENDENT_AMBULATORY_CARE_PROVIDER_SITE_OTHER): Payer: Medicare Other | Admitting: *Deleted

## 2011-10-28 ENCOUNTER — Ambulatory Visit (INDEPENDENT_AMBULATORY_CARE_PROVIDER_SITE_OTHER): Payer: Medicare Other | Admitting: Cardiology

## 2011-10-28 ENCOUNTER — Encounter: Payer: Self-pay | Admitting: Cardiology

## 2011-10-28 VITALS — BP 154/99 | HR 84 | Resp 18 | Ht 72.0 in | Wt 201.0 lb

## 2011-10-28 DIAGNOSIS — I219 Acute myocardial infarction, unspecified: Secondary | ICD-10-CM

## 2011-10-28 DIAGNOSIS — Z86718 Personal history of other venous thrombosis and embolism: Secondary | ICD-10-CM

## 2011-10-28 DIAGNOSIS — I429 Cardiomyopathy, unspecified: Secondary | ICD-10-CM

## 2011-10-28 DIAGNOSIS — I10 Essential (primary) hypertension: Secondary | ICD-10-CM

## 2011-10-28 DIAGNOSIS — Q211 Atrial septal defect: Secondary | ICD-10-CM

## 2011-10-28 DIAGNOSIS — Z7901 Long term (current) use of anticoagulants: Secondary | ICD-10-CM

## 2011-10-28 DIAGNOSIS — I6340 Cerebral infarction due to embolism of unspecified cerebral artery: Secondary | ICD-10-CM

## 2011-10-28 DIAGNOSIS — I2510 Atherosclerotic heart disease of native coronary artery without angina pectoris: Secondary | ICD-10-CM

## 2011-10-28 DIAGNOSIS — I513 Intracardiac thrombosis, not elsewhere classified: Secondary | ICD-10-CM

## 2011-10-28 DIAGNOSIS — I6350 Cerebral infarction due to unspecified occlusion or stenosis of unspecified cerebral artery: Secondary | ICD-10-CM

## 2011-10-28 DIAGNOSIS — I639 Cerebral infarction, unspecified: Secondary | ICD-10-CM

## 2011-10-28 MED ORDER — LISINOPRIL 10 MG PO TABS: 10.0000 mg | ORAL_TABLET | Freq: Every day | ORAL | Status: DC

## 2011-10-28 NOTE — Assessment & Plan Note (Signed)
Increasing ACE-I dose as noted.

## 2011-10-28 NOTE — Assessment & Plan Note (Signed)
He continues on Coumadin. 

## 2011-10-28 NOTE — Assessment & Plan Note (Signed)
Likely ischemic. He prefers to continue medical therapy without further invasive evaluation at this time. Increase Lisinopril to 10 mg daily and check BMET in 2 weeks. Followup echocardiogram for next visit.

## 2011-10-28 NOTE — Assessment & Plan Note (Signed)
No active angina and reportedly stable dyspnea on exertion. He prefers to continue medical therapy at this time. We can continue to discuss other options as outlined above.

## 2011-10-28 NOTE — Assessment & Plan Note (Signed)
Medium sized with evidence of large right to left shunt. RV size was also described as mildly dilated and reduced. No PA pressure reported. He is on Coumadin at this point with concurrent left ventricular mural thrombus. I have discussed with him further assessment for potential repair, either percutaneous or surgical, in conjunction with assessment of his coronaries. As already discussed, he does not want to pursue any further invasive evaluation at this time.  

## 2011-10-28 NOTE — Patient Instructions (Signed)
**Note De-Identified Dennis Yu Obfuscation** Your physician recommends that you return for lab work in: 2 weeks  Your physician has requested that you have an echocardiogram. Echocardiography is a painless test that uses sound waves to create images of your heart. It provides your doctor with information about the size and shape of your heart and how well your heart's chambers and valves are working. This procedure takes approximately one hour. There are no restrictions for this procedure.  Your physician has recommended you make the following change in your medication: increase Lisinopril to 10 mg daily  Your physician recommends that you schedule a follow-up appointment in: 3 months

## 2011-10-28 NOTE — Progress Notes (Signed)
Clinical Summary Dennis Yu is a medically complex 66 y.o.male presenting for followup. He was seen in January. He reports stability in his dyspnea on exertion and no significant chest pain. No palpitations or syncope.  He indicates compliance with his medications. Weight is up to 201 pounds. He denies orthopnea, PND, peripheral edema.  We reviewed his overall cardiac status, and I reminded him of the options for further evaluation including cardiac catheterization and possible revascularization. He remains firm that he does not want to pursue these options at this point.   Allergies  Allergen Reactions  . Aspirin     REACTION: GI upset    Current Outpatient Prescriptions  Medication Sig Dispense Refill  . aspirin 81 MG tablet Take 81 mg by mouth daily.        . carvedilol (COREG) 12.5 MG tablet Take 1 tablet (12.5 mg total) by mouth 2 (two) times daily with a meal. Dose increase  60 tablet  6  . diclofenac sodium (VOLTAREN) 1 % GEL Apply topically as directed.        . furosemide (LASIX) 20 MG tablet Take 1 tablet (20 mg total) by mouth as needed.  30 tablet  3  . gabapentin (NEURONTIN) 300 MG capsule TAKE THREE CAPSULES BY MOUTH EVERY DAY AT BEDTIME  90 capsule  1  . lisinopril (PRINIVIL,ZESTRIL) 10 MG tablet Take 1 tablet (10 mg total) by mouth daily.  30 tablet  3  . metFORMIN (GLUCOPHAGE) 500 MG tablet Take 500 mg by mouth 2 (two) times daily with a meal.        . NON FORMULARY legatrin pm Daily        . omeprazole (PRILOSEC) 40 MG capsule Take 40 mg by mouth daily.        . simvastatin (ZOCOR) 20 MG tablet Take 20 mg by mouth at bedtime.        [name] warfarin (COUMADIN) 5 MG tablet Take 1 tablet (5 mg total) by mouth as directed.  45 tablet  3    Past Medical History  Diagnosis Date  . Essential hypertension, benign   . Type 2 diabetes mellitus   . Mixed hyperlipidemia   . Vitamin d deficiency   . History of colonic polyps   . Erectile dysfunction   . Stroke     Embolic  left temporal 9/12 s/p tPA  . Patent foramen ovale   . Cardiomyopathy     LVEF 25%  . 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 Obstructive sleep apnea   . GERD (gastroesophageal reflux disease)   . Phlebitis   . Diabetes mellitus     Family History  Problem Relation Age of Onset  . Diabetes Father   . Hypertension      Social History Dennis Yu reports that he has quit smoking. His smoking use included Cigarettes. He has never used smokeless tobacco. Dennis Yu reports that he does not drink alcohol.  Review of Systems No palpitations or syncope. No bleeding on Coumadin. Otherwise negative except as outlined.  Physical Examination Filed Vitals:   10/28/11 0826  BP: 154/99  Pulse: 84  Resp: 18    Normally nourished appearing male in no acute distress.  HEENT: Conjuctiva and lids normal, oropharynx with moist mucosa.  Neck: Supple, no elevated JVP or carotid bruits.  Lungs: Clear to auscultation, nonlabored.  Cardiac: Regular rate and rhythm, indistinct PMI, no S3 gallop or significant murmur.  Abdomen: Soft, nontender, bowel sounds present.  Skin:  Warm and dry.  Extremities: Trace edema bilaterally below the knees, also mild chronic swelling of the left hand.  Musculoskeletal: No kyphosis.  Neuropsychiatric: Alert and oriented x3. Has residual left-sided weakness, most prominent in the upper extremity, although better in general.     Problem List and Plan

## 2011-10-31 ENCOUNTER — Other Ambulatory Visit: Payer: Self-pay

## 2011-10-31 DIAGNOSIS — I429 Cardiomyopathy, unspecified: Secondary | ICD-10-CM

## 2011-11-12 LAB — BASIC METABOLIC PANEL
BUN: 13 mg/dL (ref 6–23)
Chloride: 110 mEq/L (ref 96–112)
Glucose, Bld: 101 mg/dL — ABNORMAL HIGH (ref 70–99)
Potassium: 4.6 mEq/L (ref 3.5–5.3)
Sodium: 144 mEq/L (ref 135–145)

## 2011-11-25 ENCOUNTER — Ambulatory Visit (INDEPENDENT_AMBULATORY_CARE_PROVIDER_SITE_OTHER): Payer: Medicare Other | Admitting: *Deleted

## 2011-11-25 DIAGNOSIS — I6340 Cerebral infarction due to embolism of unspecified cerebral artery: Secondary | ICD-10-CM

## 2011-11-25 DIAGNOSIS — Z86718 Personal history of other venous thrombosis and embolism: Secondary | ICD-10-CM

## 2011-11-25 DIAGNOSIS — Z7901 Long term (current) use of anticoagulants: Secondary | ICD-10-CM

## 2011-11-25 DIAGNOSIS — I6350 Cerebral infarction due to unspecified occlusion or stenosis of unspecified cerebral artery: Secondary | ICD-10-CM

## 2011-11-25 DIAGNOSIS — I639 Cerebral infarction, unspecified: Secondary | ICD-10-CM

## 2011-12-11 ENCOUNTER — Encounter (HOSPITAL_COMMUNITY): Payer: Medicare Other | Attending: Oncology

## 2011-12-11 DIAGNOSIS — E7800 Pure hypercholesterolemia, unspecified: Secondary | ICD-10-CM | POA: Insufficient documentation

## 2011-12-11 DIAGNOSIS — Z7901 Long term (current) use of anticoagulants: Secondary | ICD-10-CM | POA: Insufficient documentation

## 2011-12-11 DIAGNOSIS — I809 Phlebitis and thrombophlebitis of unspecified site: Secondary | ICD-10-CM

## 2011-12-11 DIAGNOSIS — Z8673 Personal history of transient ischemic attack (TIA), and cerebral infarction without residual deficits: Secondary | ICD-10-CM | POA: Insufficient documentation

## 2011-12-11 DIAGNOSIS — Q211 Atrial septal defect: Secondary | ICD-10-CM | POA: Insufficient documentation

## 2011-12-11 DIAGNOSIS — Q2111 Secundum atrial septal defect: Secondary | ICD-10-CM | POA: Insufficient documentation

## 2011-12-11 DIAGNOSIS — Z86718 Personal history of other venous thrombosis and embolism: Secondary | ICD-10-CM | POA: Insufficient documentation

## 2011-12-11 DIAGNOSIS — E1149 Type 2 diabetes mellitus with other diabetic neurological complication: Secondary | ICD-10-CM | POA: Insufficient documentation

## 2011-12-11 DIAGNOSIS — E1142 Type 2 diabetes mellitus with diabetic polyneuropathy: Secondary | ICD-10-CM | POA: Insufficient documentation

## 2011-12-11 DIAGNOSIS — I2510 Atherosclerotic heart disease of native coronary artery without angina pectoris: Secondary | ICD-10-CM | POA: Insufficient documentation

## 2011-12-11 DIAGNOSIS — I10 Essential (primary) hypertension: Secondary | ICD-10-CM | POA: Insufficient documentation

## 2011-12-11 DIAGNOSIS — I429 Cardiomyopathy, unspecified: Secondary | ICD-10-CM | POA: Insufficient documentation

## 2011-12-11 DIAGNOSIS — I5189 Other ill-defined heart diseases: Secondary | ICD-10-CM | POA: Insufficient documentation

## 2011-12-11 LAB — DIFFERENTIAL
Basophils Absolute: 0 10*3/uL (ref 0.0–0.1)
Eosinophils Relative: 5 % (ref 0–5)
Lymphocytes Relative: 30 % (ref 12–46)
Monocytes Absolute: 0.5 10*3/uL (ref 0.1–1.0)
Monocytes Relative: 7 % (ref 3–12)

## 2011-12-11 LAB — CBC
HCT: 40.5 % (ref 39.0–52.0)
Hemoglobin: 12.7 g/dL — ABNORMAL LOW (ref 13.0–17.0)
MCHC: 31.4 g/dL (ref 30.0–36.0)
MCV: 88.2 fL (ref 78.0–100.0)
RDW: 13.4 % (ref 11.5–15.5)
WBC: 6.9 10*3/uL (ref 4.0–10.5)

## 2011-12-11 NOTE — Progress Notes (Signed)
Labs drawn today for cbc/diff 

## 2011-12-13 ENCOUNTER — Encounter (HOSPITAL_BASED_OUTPATIENT_CLINIC_OR_DEPARTMENT_OTHER): Payer: Medicare Other | Admitting: Oncology

## 2011-12-13 VITALS — BP 139/94 | HR 69 | Temp 98.3°F | Wt 196.4 lb

## 2011-12-13 DIAGNOSIS — I809 Phlebitis and thrombophlebitis of unspecified site: Secondary | ICD-10-CM

## 2011-12-13 DIAGNOSIS — G8929 Other chronic pain: Secondary | ICD-10-CM

## 2011-12-13 DIAGNOSIS — M79609 Pain in unspecified limb: Secondary | ICD-10-CM

## 2011-12-13 DIAGNOSIS — Z86718 Personal history of other venous thrombosis and embolism: Secondary | ICD-10-CM

## 2011-12-13 NOTE — Progress Notes (Signed)
Dennis Gully, MD, MD [address] [place] [address]  1. DVT, HX OF  warfarin (COUMADIN) 5 MG tablet, CBC, Differential, Comprehensive metabolic panel    CURRENT THERAPY: Observation  INTERVAL HISTORY: Dennis Yu 66 y.o. African American male returns for  regular  visit for followup of  superficial phlebitis of his right leg presenting here for the first time on 11/06/2007. He had a 1 time extension from the upper thigh in the past all the way down to his lower leg. However, he is primarily had only the lower leg involvement. He also had venous stasis ulcers in the past but none recently. He is utilizing pressure stocking.  Dennis Yu unfortunately had a stroke in August of last year. He was treated down at [place].  This stroke affected his left side. His speech is normal today. The stroke affected his gait, but his strength is not significantly decreased.  Otherwise, the patient's doing very well. His primary care physician is Dennis Yu.  Dennis Yu is monitoring the patient's Coumadin and treating him appropriately. He has not had any DVT or superficial phlebitis symptoms over the past year.  Hematologically, the patient denies any complaints. He denies any bleeding. He denies any rashes.  Past Medical History  Diagnosis Date  . Essential hypertension, benign   . Type 2 diabetes mellitus   . Mixed hyperlipidemia   . Vitamin d deficiency   . History of colonic polyps   . Erectile dysfunction   . 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 Patent foramen ovale   . Cardiomyopathy     LVEF 25%  . 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 Obstructive sleep apnea   . GERD (gastroesophageal reflux disease)   . Phlebitis   . Diabetes mellitus     has DIABETES MELLITUS, TYPE II, CONTROLLED, W/NEURO COMPS; Mixed hyperlipidemia; ERECTILE DYSFUNCTION; Essential hypertension, benign; Phlebitis; BACK STRAIN, LUMBAR; DVT, HX OF; Encounter for long-term (current) use  of anticoagulants; Embolic stroke; Patent foramen ovale; Coronary atherosclerosis of native coronary artery; Mural thrombus of left ventricle; and Secondary cardiomyopathy, unspecified on his problem list.     is allergic to aspirin.  Dennis Yu had no medications administered during this visit.  No past surgical history on file.  Denies any headaches, dizziness, double vision, fevers, chills, night sweats, nausea, vomiting, diarrhea, constipation, chest pain, heart palpitations, shortness of breath, blood in stool, black tarry stool, urinary pain, urinary burning, urinary frequency, hematuria.   PHYSICAL EXAMINATION  ECOG PERFORMANCE STATUS: 1 - Symptomatic but completely ambulatory  Filed Vitals:   12/13/11 0912  BP: 139/94  Pulse: 69  Temp: 98.3 F (36.8 C)    GENERAL:alert, no distress, well nourished, well developed, comfortable, cooperative and smiling SKIN: skin color, texture, turgor are normal, no rashes or significant lesions HEAD: Normocephalic, No masses, lesions, tenderness or abnormalities EYES: normal, EOMI, Conjunctiva are pink and non-injected EARS: External ears normal OROPHARYNX:lips, buccal mucosa, and tongue normal and mucous membranes are moist  NECK: supple, no adenopathy, thyroid normal size, non-tender, without nodularity, no stridor, non-tender, trachea midline LYMPH:  no palpable lymphadenopathy, no hepatosplenomegaly BREAST:not examined LUNGS: clear to auscultation and percussion HEART: regular rate & rhythm, no murmurs, no gallops, S1 normal and S2 normal ABDOMEN:abdomen soft, non-tender, obese, normal bowel sounds, no masses or organomegaly and no hepatosplenomegaly BACK: Back symmetric, no curvature., No CVA tenderness EXTREMITIES:less then 2 second capillary refill, no joint deformities, effusion, or inflammation, no  edema, no skin discoloration, no clubbing, no cyanosis.  Weakness noted below in neuro exam. NEURO: alert & oriented x 3 with fluent  speech, no focal motor/sensory deficits, positive findings: muscular weakness of left side 4+/5, compared to right 5/5, and steppage gait of left LE.   LABORATORY DATA: CBC    Component Value Date/Time   WBC 6.9 12/11/2011 0915   RBC 4.59 12/11/2011 0915   HGB 12.7* 12/11/2011 0915   HCT 40.5 12/11/2011 0915   PLT 207 12/11/2011 0915   MCV 88.2 12/11/2011 0915   MCH 27.7 12/11/2011 0915   MCHC 31.4 12/11/2011 0915   RDW 13.4 12/11/2011 0915   LYMPHSABS 2.1 12/11/2011 0915   MONOABS 0.5 12/11/2011 0915   EOSABS 0.3 12/11/2011 0915   BASOSABS 0.0 12/11/2011 0915      ASSESSMENT:  1. Superficial phlebitis of his right leg presenting here for the first time on 11/06/2007. He had a 1 time extension from the upper thigh in the past all the way down to his lower leg. However, he is primarily had only the lower leg involvement. He also had venous stasis ulcers in the past but none recently. 2. Stroke affecting left-sided body 3. History of smoking times many years, quit 4. Hypercholesterolemia 5. Sleep apnea syndrome. 6. 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 Diabetes mellitus times many years 8. HTN, followed by cardiology 9. CAD, followed by cardiology 10. Patent foramen ovale, followed by cardiology 11, Mural thrombus of left ventricle, followed by cardiology   PLAN:  1. I personally reviewed and went over laboratory results with the patient. 2. Lab work in 1 year: CBC diff, CMET 3. Return in 1 year for follow-up.   All questions were answered. The patient knows to call the clinic with any problems, questions or concerns. We can certainly see the patient much sooner if necessary.  The patient and plan discussed with Dennis Peers, MD and he is in agreement with the aforementioned.  I spent 15 minutes counseling the patient face to face. The total time spent in the appointment was 20 minutes.  Dennis Yu,Dennis Yu

## 2011-12-13 NOTE — Patient Instructions (Signed)
[facility]  Discharge Instructions  RECOMMENDATIONS MADE BY THE CONSULTANT AND ANY TEST RESULTS WILL BE SENT TO YOUR REFERRING DOCTOR.   Return to clinic in 1 year for lab work prior to MD appointment.   I acknowledge that I have been informed and understand all the instructions given to me and received a copy. I do not have any more questions at this time, but understand that I may call the Specialty Clinic at [facility] at [phone] during business hours should I have any further questions or need assistance in obtaining follow-up care.    __________________________________________  _____________  __________ Signature of Patient or Authorized Representative            Date                   Time    __________________________________________ Nurse's Signature

## 2011-12-21 ENCOUNTER — Other Ambulatory Visit: Payer: Self-pay | Admitting: Cardiology

## 2011-12-23 ENCOUNTER — Ambulatory Visit (INDEPENDENT_AMBULATORY_CARE_PROVIDER_SITE_OTHER): Payer: Medicare Other | Admitting: *Deleted

## 2011-12-23 DIAGNOSIS — I6350 Cerebral infarction due to unspecified occlusion or stenosis of unspecified cerebral artery: Secondary | ICD-10-CM

## 2011-12-23 DIAGNOSIS — I639 Cerebral infarction, unspecified: Secondary | ICD-10-CM

## 2011-12-23 DIAGNOSIS — Z7901 Long term (current) use of anticoagulants: Secondary | ICD-10-CM

## 2011-12-23 DIAGNOSIS — Z86718 Personal history of other venous thrombosis and embolism: Secondary | ICD-10-CM

## 2011-12-23 DIAGNOSIS — I6340 Cerebral infarction due to embolism of unspecified cerebral artery: Secondary | ICD-10-CM

## 2011-12-23 LAB — POCT INR: INR: 2.7

## 2011-12-24 ENCOUNTER — Other Ambulatory Visit: Payer: Self-pay | Admitting: *Deleted

## 2011-12-24 DIAGNOSIS — Z86718 Personal history of other venous thrombosis and embolism: Secondary | ICD-10-CM

## 2011-12-24 MED ORDER — CARVEDILOL 12.5 MG PO TABS: 12.5000 mg | ORAL_TABLET | Freq: Two times a day (BID) | ORAL | Status: DC

## 2011-12-24 MED ORDER — LISINOPRIL 10 MG PO TABS: 10.0000 mg | ORAL_TABLET | Freq: Every day | ORAL | Status: DC

## 2011-12-24 MED ORDER — WARFARIN SODIUM 5 MG PO TABS: 5.0000 mg | ORAL_TABLET | ORAL | Status: DC

## 2011-12-26 ENCOUNTER — Other Ambulatory Visit: Payer: Self-pay | Admitting: *Deleted

## 2011-12-26 DIAGNOSIS — Z86718 Personal history of other venous thrombosis and embolism: Secondary | ICD-10-CM

## 2011-12-26 MED ORDER — WARFARIN SODIUM 5 MG PO TABS: 5.0000 mg | ORAL_TABLET | ORAL | Status: DC

## 2012-01-02 ENCOUNTER — Other Ambulatory Visit: Payer: Self-pay | Admitting: *Deleted

## 2012-01-20 ENCOUNTER — Ambulatory Visit (HOSPITAL_COMMUNITY)
Admission: RE | Admit: 2012-01-20 | Discharge: 2012-01-20 | Disposition: A | Discharge status: Home / Self Care | Payer: Medicare Other | Source: Ambulatory Visit | Attending: Cardiology | Admitting: Cardiology

## 2012-01-20 ENCOUNTER — Ambulatory Visit (INDEPENDENT_AMBULATORY_CARE_PROVIDER_SITE_OTHER): Payer: Medicare Other | Admitting: *Deleted

## 2012-01-20 DIAGNOSIS — E119 Type 2 diabetes mellitus without complications: Secondary | ICD-10-CM | POA: Insufficient documentation

## 2012-01-20 DIAGNOSIS — Z7901 Long term (current) use of anticoagulants: Secondary | ICD-10-CM

## 2012-01-20 DIAGNOSIS — I059 Rheumatic mitral valve disease, unspecified: Secondary | ICD-10-CM

## 2012-01-20 DIAGNOSIS — I6350 Cerebral infarction due to unspecified occlusion or stenosis of unspecified cerebral artery: Secondary | ICD-10-CM

## 2012-01-20 DIAGNOSIS — Z86718 Personal history of other venous thrombosis and embolism: Secondary | ICD-10-CM

## 2012-01-20 DIAGNOSIS — E785 Hyperlipidemia, unspecified: Secondary | ICD-10-CM | POA: Insufficient documentation

## 2012-01-20 DIAGNOSIS — I2589 Other forms of chronic ischemic heart disease: Secondary | ICD-10-CM | POA: Insufficient documentation

## 2012-01-20 DIAGNOSIS — I639 Cerebral infarction, unspecified: Secondary | ICD-10-CM

## 2012-01-20 DIAGNOSIS — I429 Cardiomyopathy, unspecified: Secondary | ICD-10-CM

## 2012-01-20 DIAGNOSIS — I10 Essential (primary) hypertension: Secondary | ICD-10-CM | POA: Insufficient documentation

## 2012-01-20 DIAGNOSIS — I6340 Cerebral infarction due to embolism of unspecified cerebral artery: Secondary | ICD-10-CM

## 2012-01-20 NOTE — Progress Notes (Signed)
*  PRELIMINARY RESULTS* Echocardiogram 2D Echocardiogram has been performed.  Dennis Yu 01/20/2012, 10:50 AM

## 2012-01-30 ENCOUNTER — Encounter: Payer: Self-pay | Admitting: Cardiology

## 2012-01-30 ENCOUNTER — Ambulatory Visit (INDEPENDENT_AMBULATORY_CARE_PROVIDER_SITE_OTHER): Payer: Medicare Other | Admitting: Cardiology

## 2012-01-30 VITALS — BP 117/96 | HR 70 | Ht 72.0 in | Wt 201.0 lb

## 2012-01-30 DIAGNOSIS — I513 Intracardiac thrombosis, not elsewhere classified: Secondary | ICD-10-CM

## 2012-01-30 DIAGNOSIS — I429 Cardiomyopathy, unspecified: Secondary | ICD-10-CM

## 2012-01-30 DIAGNOSIS — I219 Acute myocardial infarction, unspecified: Secondary | ICD-10-CM

## 2012-01-30 DIAGNOSIS — I2510 Atherosclerotic heart disease of native coronary artery without angina pectoris: Secondary | ICD-10-CM

## 2012-01-30 DIAGNOSIS — I10 Essential (primary) hypertension: Secondary | ICD-10-CM

## 2012-01-30 DIAGNOSIS — Q211 Atrial septal defect: Secondary | ICD-10-CM

## 2012-01-30 NOTE — Assessment & Plan Note (Signed)
No active angina and reportedly stable dyspnea on exertion. He prefers to continue medical therapy at this time.

## 2012-01-30 NOTE — Assessment & Plan Note (Signed)
He continues on Coumadin. 

## 2012-01-30 NOTE — Assessment & Plan Note (Signed)
Likely ischemic. He prefers to continue medical therapy without further invasive evaluation at this time. Followup echocardiogram shows no significant improvement in function. We would not pursue device treatments without first assessing for revascularization options, and in each case he is not interested in proceeding at this time.

## 2012-01-30 NOTE — Assessment & Plan Note (Signed)
Although not specifically mentioned most recent echocardiogram report, he has history of a medium sized PFO with evidence of large right to left shunt. RV size was also described as mildly dilated and reduced. He states that he does not want to pursue evaluation for repair at this time.

## 2012-01-30 NOTE — Progress Notes (Signed)
Clinical Summary Dennis Yu is a medically complex 66 y.o.male presenting for followup. He was seen back in March.  Recent followup echocardiogram shows LVEF still in the range of 20-25% with wall motion abnormalities, fixed mural thrombus at the apex, mild mitral regurgitation, mild to moderate left atrial enlargement. PFO was not reported on the most recent study. We reviewed these results. He still remains of the mind that he does not want to pursue any invasive workup. He reports no significant angina, no palpitations, NYHA class II dyspnea on exertion. He admits that he is not very active.  BMET from April showed potassium 4.6, BUN 13, creatinine 1.0. He reports compliance with medications. Blood pressure is much better controlled today. Weight is up 5 pounds from last visit.   Allergies  Allergen Reactions  . Aspirin     REACTION: GI upset    Current Outpatient Prescriptions  Medication Sig Dispense Refill  . aspirin 81 MG tablet Take 81 mg by mouth daily.        . carvedilol (COREG) 12.5 MG tablet Take 1 tablet (12.5 mg total) by mouth 2 (two) times daily with a meal.  180 tablet  1  . diclofenac sodium (VOLTAREN) 1 % GEL Apply topically as directed.        . furosemide (LASIX) 20 MG tablet Take 1 tablet (20 mg total) by mouth as needed.  30 tablet  3  . gabapentin (NEURONTIN) 300 MG capsule TAKE THREE CAPSULES BY MOUTH EVERY DAY AT BEDTIME  90 capsule  1  . lisinopril (PRINIVIL,ZESTRIL) 10 MG tablet Take 1 tablet (10 mg total) by mouth daily.  90 tablet  1  . metFORMIN (GLUCOPHAGE) 500 MG tablet Take 500 mg by mouth 2 (two) times daily with a meal.        . NON FORMULARY legatrin pm Daily        . omeprazole (PRILOSEC) 40 MG capsule Take 40 mg by mouth daily.        . simvastatin (ZOCOR) 20 MG tablet Take 20 mg by mouth at bedtime.        [name] warfarin (COUMADIN) 5 MG tablet Take 1 tablet (5 mg total) by mouth continuous. Take 10 mg on Mon Wed and Fri, 5 mg all other days  120  tablet  0    Past Medical History  Diagnosis Date  . Essential hypertension, benign   . Type 2 diabetes mellitus   . Mixed hyperlipidemia   . Vitamin d deficiency   . History of colonic polyps   . Erectile dysfunction   . 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 Patent foramen ovale   . Cardiomyopathy     LVEF 25%  . 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 Obstructive sleep apnea   . GERD (gastroesophageal reflux disease)   . Phlebitis   . Diabetes mellitus     Social History Mr. Stecher reports that he has quit smoking. His smoking use included Cigarettes. He has never used smokeless tobacco. Mr. Sommers reports that he does not drink alcohol.  Review of Systems No reported bleeding problems on Coumadin. No peripheral edema. No orthopnea. Otherwise negative except as outlined above.  Physical Examination Filed Vitals:   01/30/12 1426  BP: 117/96  Pulse: 70    Normally nourished appearing male in no acute distress.  HEENT: Conjuctiva and lids normal, oropharynx with moist mucosa.  Neck: Supple, no elevated JVP or carotid bruits.  Lungs: Clear to auscultation, nonlabored.  Cardiac: Regular rate and rhythm, indistinct PMI, no S3 gallop or significant murmur.  Abdomen: Soft, nontender, bowel sounds present.  Skin: Warm and dry.  Extremities: Trace edema bilaterally below the knees, also mild chronic swelling of the left hand.  Musculoskeletal: No kyphosis.  Neuropsychiatric: Alert and oriented x3. Has mild residual left-sided weakness, most prominent in the upper extremity.  Problem List and Plan   Secondary cardiomyopathy, unspecified Likely ischemic. He prefers to continue medical therapy without further invasive evaluation at this time. Followup echocardiogram shows no significant improvement in function. We would not pursue device treatments without first assessing for revascularization options, and in each case he is not interested in proceeding at this  time.    Mural thrombus of left ventricle He continues on Coumadin.  Essential hypertension, benign Blood pressure much better controlled. Continue current regimen.  Coronary atherosclerosis of native coronary artery No active angina and reportedly stable dyspnea on exertion. He prefers to continue medical therapy at this time.   Patent foramen ovale Although not specifically mentioned most recent echocardiogram report, he has history of a medium sized PFO with evidence of large right to left shunt. RV size was also described as mildly dilated and reduced. He states that he does not want to pursue evaluation for repair at this time.    Jonelle Sidle, M.D., F.A.C.C.

## 2012-01-30 NOTE — Assessment & Plan Note (Signed)
Blood pressure much better controlled. Continue current regimen.

## 2012-01-30 NOTE — Patient Instructions (Addendum)
**Note De-identified Fredrick Dray Obfuscation** Your physician recommends that you continue on your current medications as directed. Please refer to the Current Medication list given to you today.  Your physician recommends that you schedule a follow-up appointment in: 3 months  

## 2012-02-10 ENCOUNTER — Ambulatory Visit (INDEPENDENT_AMBULATORY_CARE_PROVIDER_SITE_OTHER): Payer: Medicare Other | Admitting: *Deleted

## 2012-02-10 DIAGNOSIS — I639 Cerebral infarction, unspecified: Secondary | ICD-10-CM

## 2012-02-10 DIAGNOSIS — Z86718 Personal history of other venous thrombosis and embolism: Secondary | ICD-10-CM

## 2012-02-10 DIAGNOSIS — Z7901 Long term (current) use of anticoagulants: Secondary | ICD-10-CM

## 2012-02-10 DIAGNOSIS — I6350 Cerebral infarction due to unspecified occlusion or stenosis of unspecified cerebral artery: Secondary | ICD-10-CM

## 2012-02-10 DIAGNOSIS — I6340 Cerebral infarction due to embolism of unspecified cerebral artery: Secondary | ICD-10-CM

## 2012-02-10 LAB — POCT INR: INR: 2.4

## 2012-02-10 MED ORDER — WARFARIN SODIUM 5 MG PO TABS: ORAL_TABLET | ORAL | Status: DC

## 2012-03-09 ENCOUNTER — Ambulatory Visit (INDEPENDENT_AMBULATORY_CARE_PROVIDER_SITE_OTHER): Payer: Medicare Other | Admitting: *Deleted

## 2012-03-09 DIAGNOSIS — I6340 Cerebral infarction due to embolism of unspecified cerebral artery: Secondary | ICD-10-CM

## 2012-03-09 DIAGNOSIS — Z86718 Personal history of other venous thrombosis and embolism: Secondary | ICD-10-CM

## 2012-03-09 DIAGNOSIS — I639 Cerebral infarction, unspecified: Secondary | ICD-10-CM

## 2012-03-09 DIAGNOSIS — I6350 Cerebral infarction due to unspecified occlusion or stenosis of unspecified cerebral artery: Secondary | ICD-10-CM

## 2012-03-09 DIAGNOSIS — Z7901 Long term (current) use of anticoagulants: Secondary | ICD-10-CM

## 2012-04-07 ENCOUNTER — Other Ambulatory Visit: Payer: Self-pay | Admitting: *Deleted

## 2012-04-07 DIAGNOSIS — Z86718 Personal history of other venous thrombosis and embolism: Secondary | ICD-10-CM

## 2012-04-07 MED ORDER — WARFARIN SODIUM 5 MG PO TABS: ORAL_TABLET | ORAL | Status: DC

## 2012-04-15 ENCOUNTER — Ambulatory Visit (INDEPENDENT_AMBULATORY_CARE_PROVIDER_SITE_OTHER): Payer: Medicare Other | Admitting: *Deleted

## 2012-04-15 DIAGNOSIS — Z86718 Personal history of other venous thrombosis and embolism: Secondary | ICD-10-CM

## 2012-04-15 DIAGNOSIS — I6340 Cerebral infarction due to embolism of unspecified cerebral artery: Secondary | ICD-10-CM

## 2012-04-15 DIAGNOSIS — I639 Cerebral infarction, unspecified: Secondary | ICD-10-CM

## 2012-04-15 DIAGNOSIS — I6350 Cerebral infarction due to unspecified occlusion or stenosis of unspecified cerebral artery: Secondary | ICD-10-CM

## 2012-04-15 DIAGNOSIS — Z7901 Long term (current) use of anticoagulants: Secondary | ICD-10-CM

## 2012-04-24 NOTE — Telephone Encounter (Signed)
This encounter was created in error - please disregard.

## 2012-04-27 ENCOUNTER — Other Ambulatory Visit (HOSPITAL_COMMUNITY): Payer: Self-pay | Admitting: Oncology

## 2012-04-27 ENCOUNTER — Encounter: Payer: Self-pay | Admitting: Cardiology

## 2012-04-27 ENCOUNTER — Ambulatory Visit (INDEPENDENT_AMBULATORY_CARE_PROVIDER_SITE_OTHER): Payer: Medicare Other | Admitting: Cardiology

## 2012-04-27 VITALS — BP 138/82 | HR 78 | Ht 72.0 in | Wt 199.0 lb

## 2012-04-27 DIAGNOSIS — E1149 Type 2 diabetes mellitus with other diabetic neurological complication: Secondary | ICD-10-CM

## 2012-04-27 DIAGNOSIS — I10 Essential (primary) hypertension: Secondary | ICD-10-CM

## 2012-04-27 DIAGNOSIS — I429 Cardiomyopathy, unspecified: Secondary | ICD-10-CM

## 2012-04-27 DIAGNOSIS — I219 Acute myocardial infarction, unspecified: Secondary | ICD-10-CM

## 2012-04-27 DIAGNOSIS — I2510 Atherosclerotic heart disease of native coronary artery without angina pectoris: Secondary | ICD-10-CM

## 2012-04-27 DIAGNOSIS — I513 Intracardiac thrombosis, not elsewhere classified: Secondary | ICD-10-CM

## 2012-04-27 MED ORDER — GABAPENTIN 300 MG PO CAPS: 900.0000 mg | ORAL_CAPSULE | Freq: Every day | ORAL | Status: DC

## 2012-04-27 MED ORDER — FUROSEMIDE 20 MG PO TABS: 20.0000 mg | ORAL_TABLET | ORAL | Status: DC | PRN

## 2012-04-27 MED ORDER — SIMVASTATIN 20 MG PO TABS: 20.0000 mg | ORAL_TABLET | Freq: Every day | ORAL | Status: DC

## 2012-04-27 MED ORDER — CARVEDILOL 12.5 MG PO TABS: 12.5000 mg | ORAL_TABLET | Freq: Two times a day (BID) | ORAL | Status: DC

## 2012-04-27 MED ORDER — LISINOPRIL 10 MG PO TABS: 10.0000 mg | ORAL_TABLET | Freq: Every day | ORAL | Status: DC

## 2012-04-27 NOTE — Progress Notes (Signed)
Clinical Summary Mr. Colter is a 66 y.o.male presenting for followup. He was seen in June. He reports no progressive symptoms since that visit, importantly no chest pain or progressive shortness of breath. He states that he has been compliant with his medications, including Coumadin. No reported bleeding episodes.  ECG shows sinus rhythm with PVC, old IMI pattern and ALMI pattern. He remains firm that he wants to pursue a conservative approach to his cardiac management as outlined previously.  He needs refills on his cardiac medications.  Allergies  Allergen Reactions  . Aspirin     REACTION: GI upset (high dosage)    Current Outpatient Prescriptions  Medication Sig Dispense Refill  . aspirin 81 MG tablet Take 81 mg by mouth daily.        . carvedilol (COREG) 12.5 MG tablet Take 1 tablet (12.5 mg total) by mouth 2 (two) times daily with a meal.  180 tablet  3  . diclofenac sodium (VOLTAREN) 1 % GEL Apply topically as directed.        . furosemide (LASIX) 20 MG tablet Take 1 tablet (20 mg total) by mouth as needed.  30 tablet  3  . gabapentin (NEURONTIN) 300 MG capsule TAKE THREE CAPSULES BY MOUTH EVERY DAY AT BEDTIME  90 capsule  1  . lisinopril (PRINIVIL,ZESTRIL) 10 MG tablet Take 1 tablet (10 mg total) by mouth daily.  90 tablet  3  . metFORMIN (GLUCOPHAGE) 500 MG tablet Take 500 mg by mouth 2 (two) times daily with a meal.        . NON FORMULARY legatrin pm Daily        . simvastatin (ZOCOR) 20 MG tablet Take 1 tablet (20 mg total) by mouth at bedtime.  90 tablet  3  . warfarin (COUMADIN) 5 MG tablet Take as directed by Anticoagulation Clinic  120 tablet  0  . DISCONTD: carvedilol (COREG) 12.5 MG tablet Take 1 tablet (12.5 mg total) by mouth 2 (two) times daily with a meal.  180 tablet  1  . DISCONTD: furosemide (LASIX) 20 MG tablet Take 1 tablet (20 mg total) by mouth as needed.  30 tablet  3  . DISCONTD: lisinopril (PRINIVIL,ZESTRIL) 10 MG tablet Take 1 tablet (10 mg total) by  mouth daily.  90 tablet  1  . DISCONTD: simvastatin (ZOCOR) 20 MG tablet Take 20 mg by mouth at bedtime.          Past Medical History  Diagnosis Date  . Essential hypertension, benign   . Type 2 diabetes mellitus   . Mixed hyperlipidemia   . Vitamin d deficiency   . History of colonic polyps   . Erectile dysfunction   . 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 Patent foramen ovale   . Cardiomyopathy     LVEF 25%  . 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 Obstructive sleep apnea   . GERD (gastroesophageal reflux disease)   . Phlebitis   . Diabetes mellitus     Social History Mr. Tozzi reports that he has quit smoking. His smoking use included Cigarettes. He has never used smokeless tobacco. Mr. Reister reports that he does not drink alcohol.  Review of Systems No palpitations, new focal motor weakness or speech deficits, bleeding problems. Stable appetite. Otherwise negative.  Physical Examination Filed Vitals:   04/27/12 1420  BP: 138/82  Pulse: 78   Filed Weights   04/27/12 1420  Weight: 199 lb (90.266  kg)   HEENT: Conjuctiva and lids normal, oropharynx with moist mucosa.  Neck: Supple, no elevated JVP or carotid bruits.  Lungs: Clear to auscultation, nonlabored.  Cardiac: Regular rate and rhythm, indistinct PMI, no S3 gallop or significant murmur.  Abdomen: Soft, nontender, bowel sounds present.  Skin: Warm and dry.  Extremities: Trace edema bilaterally below the knees, also mild chronic swelling of the left hand.  Musculoskeletal: No kyphosis.  Neuropsychiatric: Alert and oriented x3. Has mild residual left-sided weakness, most prominent in the upper extremity.    Problem List and Plan   Secondary cardiomyopathy, unspecified Symptomatically stable on medical therapy. Continue conservative management, in line with patient request. Followup arranged.  Essential hypertension, benign Blood pressure has been reasonably well-controlled.  Mural thrombus  of left ventricle Continue Coumadin.    Jonelle Sidle, M.D., F.A.C.C.

## 2012-04-27 NOTE — Patient Instructions (Signed)
Your physician recommends that you schedule a follow-up appointment in: 4 months  

## 2012-04-27 NOTE — Assessment & Plan Note (Signed)
Continue Coumadin. 

## 2012-04-27 NOTE — Assessment & Plan Note (Signed)
Blood pressure has been reasonably well-controlled. 

## 2012-04-27 NOTE — Assessment & Plan Note (Signed)
Symptomatically stable on medical therapy. Continue conservative management, in line with patient request. Followup arranged.

## 2012-05-08 ENCOUNTER — Other Ambulatory Visit: Payer: Self-pay | Admitting: Cardiology

## 2012-05-08 MED ORDER — LISINOPRIL 10 MG PO TABS: 10.0000 mg | ORAL_TABLET | Freq: Every day | ORAL | Status: DC

## 2012-05-08 MED ORDER — CARVEDILOL 12.5 MG PO TABS: 12.5000 mg | ORAL_TABLET | Freq: Two times a day (BID) | ORAL | Status: DC

## 2012-05-27 ENCOUNTER — Ambulatory Visit (INDEPENDENT_AMBULATORY_CARE_PROVIDER_SITE_OTHER): Payer: Medicare Other | Admitting: *Deleted

## 2012-05-27 DIAGNOSIS — I639 Cerebral infarction, unspecified: Secondary | ICD-10-CM

## 2012-05-27 DIAGNOSIS — I6350 Cerebral infarction due to unspecified occlusion or stenosis of unspecified cerebral artery: Secondary | ICD-10-CM

## 2012-05-27 DIAGNOSIS — I6340 Cerebral infarction due to embolism of unspecified cerebral artery: Secondary | ICD-10-CM

## 2012-05-27 DIAGNOSIS — Z7901 Long term (current) use of anticoagulants: Secondary | ICD-10-CM

## 2012-05-27 DIAGNOSIS — Z86718 Personal history of other venous thrombosis and embolism: Secondary | ICD-10-CM

## 2012-05-27 LAB — POCT INR: INR: 2.1

## 2012-06-09 ENCOUNTER — Other Ambulatory Visit: Payer: Self-pay | Admitting: Cardiology

## 2012-06-09 DIAGNOSIS — Z86718 Personal history of other venous thrombosis and embolism: Secondary | ICD-10-CM

## 2012-06-09 NOTE — Telephone Encounter (Signed)
PT IS OUT OF COUMADIN

## 2012-06-10 MED ORDER — WARFARIN SODIUM 5 MG PO TABS: ORAL_TABLET | ORAL | Status: DC

## 2012-06-10 MED ORDER — LISINOPRIL 10 MG PO TABS: 10.0000 mg | ORAL_TABLET | Freq: Every day | ORAL | Status: DC

## 2012-06-10 MED ORDER — CARVEDILOL 12.5 MG PO TABS: 12.5000 mg | ORAL_TABLET | Freq: Two times a day (BID) | ORAL | Status: DC

## 2012-07-08 ENCOUNTER — Ambulatory Visit (INDEPENDENT_AMBULATORY_CARE_PROVIDER_SITE_OTHER): Payer: Medicare Other | Admitting: *Deleted

## 2012-07-08 DIAGNOSIS — Z86718 Personal history of other venous thrombosis and embolism: Secondary | ICD-10-CM

## 2012-07-08 DIAGNOSIS — I639 Cerebral infarction, unspecified: Secondary | ICD-10-CM

## 2012-07-08 DIAGNOSIS — I6350 Cerebral infarction due to unspecified occlusion or stenosis of unspecified cerebral artery: Secondary | ICD-10-CM

## 2012-07-08 DIAGNOSIS — I6340 Cerebral infarction due to embolism of unspecified cerebral artery: Secondary | ICD-10-CM

## 2012-07-08 DIAGNOSIS — Z7901 Long term (current) use of anticoagulants: Secondary | ICD-10-CM

## 2012-08-19 ENCOUNTER — Ambulatory Visit (INDEPENDENT_AMBULATORY_CARE_PROVIDER_SITE_OTHER): Payer: Medicare Other | Admitting: *Deleted

## 2012-08-19 DIAGNOSIS — Z7901 Long term (current) use of anticoagulants: Secondary | ICD-10-CM

## 2012-08-19 DIAGNOSIS — I6350 Cerebral infarction due to unspecified occlusion or stenosis of unspecified cerebral artery: Secondary | ICD-10-CM

## 2012-08-19 DIAGNOSIS — I639 Cerebral infarction, unspecified: Secondary | ICD-10-CM

## 2012-08-19 DIAGNOSIS — I6340 Cerebral infarction due to embolism of unspecified cerebral artery: Secondary | ICD-10-CM

## 2012-08-19 DIAGNOSIS — Z86718 Personal history of other venous thrombosis and embolism: Secondary | ICD-10-CM

## 2012-08-19 MED ORDER — WARFARIN SODIUM 5 MG PO TABS: ORAL_TABLET | ORAL | Status: DC

## 2012-08-26 ENCOUNTER — Encounter: Payer: Self-pay | Admitting: Cardiology

## 2012-08-26 ENCOUNTER — Ambulatory Visit (INDEPENDENT_AMBULATORY_CARE_PROVIDER_SITE_OTHER): Payer: Medicare Other | Admitting: Cardiology

## 2012-08-26 VITALS — BP 141/93 | HR 73 | Ht 72.0 in | Wt 196.8 lb

## 2012-08-26 DIAGNOSIS — I10 Essential (primary) hypertension: Secondary | ICD-10-CM

## 2012-08-26 DIAGNOSIS — I513 Intracardiac thrombosis, not elsewhere classified: Secondary | ICD-10-CM

## 2012-08-26 DIAGNOSIS — E782 Mixed hyperlipidemia: Secondary | ICD-10-CM

## 2012-08-26 DIAGNOSIS — I219 Acute myocardial infarction, unspecified: Secondary | ICD-10-CM

## 2012-08-26 DIAGNOSIS — I2510 Atherosclerotic heart disease of native coronary artery without angina pectoris: Secondary | ICD-10-CM

## 2012-08-26 DIAGNOSIS — I429 Cardiomyopathy, unspecified: Secondary | ICD-10-CM

## 2012-08-26 MED ORDER — LISINOPRIL 20 MG PO TABS: 20.0000 mg | ORAL_TABLET | Freq: Every day | ORAL | Status: DC

## 2012-08-26 NOTE — Assessment & Plan Note (Signed)
He continues on Coumadin. 

## 2012-08-26 NOTE — Assessment & Plan Note (Signed)
On statin therapy, followed by Dr. Felecia Shelling. Goal LDL should be close to 70 if possible.

## 2012-08-26 NOTE — Assessment & Plan Note (Signed)
No active angina symptoms. 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 Previous Myoview showed evidence of dense scar affecting the inferoposterior walls as well as moderate sized scar in the anteroseptal distribution with mild to moderate periinfarct ischemia at the apex of both anterior and inferior walls. He has consistently declined further invasive evaluation for revascularization.

## 2012-08-26 NOTE — Assessment & Plan Note (Signed)
Increasing lisinopril dose as noted above.

## 2012-08-26 NOTE — Progress Notes (Signed)
Clinical Summary [name] is a 67 y.o.male presenting for followup. He was seen in September 2013. He reports stable NYHA class II dyspnea, no chest pain or palpitations. No new neurological deficits.  He has not been walking as regularly during the winter months but plans to get back to it when he can. He reports compliance with his medications. Weight is down about 2 pounds from last visit.  He is due for followup lab work and visit with Dr. Felecia Shelling over the next few weeks.   Allergies  Allergen Reactions  . Aspirin     REACTION: GI upset (high dosage)    Current Outpatient Prescriptions  Medication Sig Dispense Refill  . aspirin 81 MG tablet Take 81 mg by mouth daily.        . carvedilol (COREG) 12.5 MG tablet Take 1 tablet (12.5 mg total) by mouth 2 (two) times daily with a meal.  180 tablet  3  . diclofenac sodium (VOLTAREN) 1 % GEL Apply topically as directed.        . furosemide (LASIX) 20 MG tablet Take 1 tablet (20 mg total) by mouth as needed.  30 tablet  3  . gabapentin (NEURONTIN) 300 MG capsule Take 3 capsules (900 mg total) by mouth daily.  90 capsule  1  . lisinopril (PRINIVIL,ZESTRIL) 10 MG tablet Take 1 tablet (10 mg total) by mouth daily.  90 tablet  3  . metFORMIN (GLUCOPHAGE) 500 MG tablet Take 500 mg by mouth 2 (two) times daily with a meal.        . NON FORMULARY legatrin pm Daily        . simvastatin (ZOCOR) 20 MG tablet Take 1 tablet (20 mg total) by mouth at bedtime.  90 tablet  3  . warfarin (COUMADIN) 5 MG tablet Take as directed by Anticoagulation Clinic  120 tablet  2    Past Medical History  Diagnosis Date  . Essential hypertension, benign   . Type 2 diabetes mellitus   . Mixed hyperlipidemia   . Vitamin D deficiency   . History of colonic polyps   . Erectile dysfunction   . 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 Patent foramen ovale   . Cardiomyopathy     LVEF 25%  . 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 Obstructive sleep apnea     . GERD (gastroesophageal reflux disease)   . Phlebitis   . Diabetes mellitus     Social History Dennis Yu reports that he has quit smoking. His smoking use included Cigarettes. He has never used smokeless tobacco. Dennis Yu reports that he does not drink alcohol.  Review of Systems No bleeding problems on Coumadin. No syncope. Stable appetite. No orthopnea or PND. No leg edema. Otherwise negative.  Physical Examination Filed Vitals:   08/26/12 0840  BP: 141/93  Pulse: 73   Filed Weights   08/26/12 0840  Weight: 196 lb 12 oz (89.245 kg)   HEENT: Conjuctiva and lids normal, oropharynx with moist mucosa.  Neck: Supple, no elevated JVP or carotid bruits.  Lungs: Clear to auscultation, nonlabored.  Cardiac: Regular rate and rhythm, indistinct PMI, no S3 gallop or significant murmur.  Abdomen: Soft, nontender, bowel sounds present.  Skin: Warm and dry.  Extremities: No edema, mild chronic swelling of the left hand.  Musculoskeletal: No kyphosis.  Neuropsychiatric: Alert and oriented x3. Has mild residual left-sided weakness, most prominent in the upper extremity.  Problem List and Plan   Secondary cardiomyopathy, unspecified Chronic systolic heart failure, symptomatically stable, LVEF 20-25%. Patient has preferred a conservative approach overall, declines further invasive evaluation for revascularization or device therapies. Will increase lisinopril to 20 mg daily for better blood pressure control and afterload reduction. Followup lab work with Dr. Felecia Shelling.  Mural thrombus of left ventricle He continues on Coumadin.  Mixed hyperlipidemia On statin therapy, followed by Dr. Felecia Shelling. Goal LDL should be close to 70 if possible.  Essential hypertension, benign Increasing lisinopril dose as noted above.  Coronary atherosclerosis of native coronary artery No active angina symptoms. 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 Previous Myoview showed evidence of dense scar affecting  the inferoposterior walls as well as moderate sized scar in the anteroseptal distribution with mild to moderate periinfarct ischemia at the apex of both anterior and inferior walls. He has consistently declined further invasive evaluation for revascularization.     Jonelle Sidle, M.D., F.A.C.C.

## 2012-08-26 NOTE — Patient Instructions (Signed)
Your physician recommends that you schedule a follow-up appointment in: 4months  Your physician has recommended you make the following change in your medication:  1 - INCREASE Lisinopril to 20 mg daily (you may take 2 of your 10 mg tablets until bottle is finished)

## 2012-08-26 NOTE — Addendum Note (Signed)
Addended by: Reather Laurence A on: 08/26/2012 09:49 AM   Modules accepted: Orders

## 2012-08-26 NOTE — Assessment & Plan Note (Signed)
Chronic systolic heart failure, symptomatically stable, LVEF 20-25%. Patient has preferred a conservative approach overall, declines further invasive evaluation for revascularization or device therapies. Will increase lisinopril to 20 mg daily for better blood pressure control and afterload reduction. Followup lab work with Dr. Felecia Shelling.

## 2012-09-30 ENCOUNTER — Ambulatory Visit (INDEPENDENT_AMBULATORY_CARE_PROVIDER_SITE_OTHER): Payer: Medicare Other | Admitting: *Deleted

## 2012-09-30 DIAGNOSIS — I6340 Cerebral infarction due to embolism of unspecified cerebral artery: Secondary | ICD-10-CM

## 2012-09-30 DIAGNOSIS — I639 Cerebral infarction, unspecified: Secondary | ICD-10-CM

## 2012-09-30 DIAGNOSIS — Z7901 Long term (current) use of anticoagulants: Secondary | ICD-10-CM

## 2012-09-30 DIAGNOSIS — Z86718 Personal history of other venous thrombosis and embolism: Secondary | ICD-10-CM

## 2012-09-30 DIAGNOSIS — I6350 Cerebral infarction due to unspecified occlusion or stenosis of unspecified cerebral artery: Secondary | ICD-10-CM

## 2012-09-30 LAB — POCT INR: INR: 2.9

## 2012-11-03 DIAGNOSIS — J40 Bronchitis, not specified as acute or chronic: Secondary | ICD-10-CM

## 2012-11-03 HISTORY — DX: Bronchitis, not specified as acute or chronic: J40

## 2012-11-11 ENCOUNTER — Ambulatory Visit (INDEPENDENT_AMBULATORY_CARE_PROVIDER_SITE_OTHER): Payer: Medicare Other | Admitting: *Deleted

## 2012-11-11 DIAGNOSIS — I639 Cerebral infarction, unspecified: Secondary | ICD-10-CM

## 2012-11-11 DIAGNOSIS — I6340 Cerebral infarction due to embolism of unspecified cerebral artery: Secondary | ICD-10-CM

## 2012-11-11 DIAGNOSIS — Z7901 Long term (current) use of anticoagulants: Secondary | ICD-10-CM

## 2012-11-11 DIAGNOSIS — Z86718 Personal history of other venous thrombosis and embolism: Secondary | ICD-10-CM

## 2012-11-11 DIAGNOSIS — I6350 Cerebral infarction due to unspecified occlusion or stenosis of unspecified cerebral artery: Secondary | ICD-10-CM

## 2012-11-11 LAB — POCT INR: INR: 2.6

## 2012-12-01 ENCOUNTER — Ambulatory Visit (HOSPITAL_COMMUNITY)
Admission: RE | Admit: 2012-12-01 | Discharge: 2012-12-01 | Disposition: A | Discharge status: Home / Self Care | Payer: Medicare Other | Source: Ambulatory Visit | Attending: Internal Medicine | Admitting: Internal Medicine

## 2012-12-01 ENCOUNTER — Other Ambulatory Visit (HOSPITAL_COMMUNITY): Payer: Self-pay | Admitting: Internal Medicine

## 2012-12-01 DIAGNOSIS — I10 Essential (primary) hypertension: Secondary | ICD-10-CM | POA: Insufficient documentation

## 2012-12-01 DIAGNOSIS — Z87891 Personal history of nicotine dependence: Secondary | ICD-10-CM | POA: Insufficient documentation

## 2012-12-01 DIAGNOSIS — I700 Atherosclerosis of aorta: Secondary | ICD-10-CM | POA: Insufficient documentation

## 2012-12-01 DIAGNOSIS — I517 Cardiomegaly: Secondary | ICD-10-CM | POA: Insufficient documentation

## 2012-12-01 DIAGNOSIS — R0602 Shortness of breath: Secondary | ICD-10-CM

## 2012-12-07 ENCOUNTER — Encounter (HOSPITAL_COMMUNITY): Payer: Medicare Other | Attending: Oncology

## 2012-12-07 DIAGNOSIS — E119 Type 2 diabetes mellitus without complications: Secondary | ICD-10-CM | POA: Insufficient documentation

## 2012-12-07 DIAGNOSIS — E7800 Pure hypercholesterolemia, unspecified: Secondary | ICD-10-CM | POA: Insufficient documentation

## 2012-12-07 DIAGNOSIS — I10 Essential (primary) hypertension: Secondary | ICD-10-CM | POA: Insufficient documentation

## 2012-12-07 DIAGNOSIS — I8000 Phlebitis and thrombophlebitis of superficial vessels of unspecified lower extremity: Secondary | ICD-10-CM | POA: Insufficient documentation

## 2012-12-07 DIAGNOSIS — I809 Phlebitis and thrombophlebitis of unspecified site: Secondary | ICD-10-CM

## 2012-12-07 DIAGNOSIS — Z86718 Personal history of other venous thrombosis and embolism: Secondary | ICD-10-CM

## 2012-12-07 LAB — DIFFERENTIAL
Eosinophils Absolute: 0.3 10*3/uL (ref 0.0–0.7)
Eosinophils Relative: 4 % (ref 0–5)
Lymphs Abs: 2.2 10*3/uL (ref 0.7–4.0)
Monocytes Relative: 9 % (ref 3–12)

## 2012-12-07 LAB — COMPREHENSIVE METABOLIC PANEL
ALT: 17 U/L (ref 0–53)
Albumin: 4 g/dL (ref 3.5–5.2)
Alkaline Phosphatase: 53 U/L (ref 39–117)
Chloride: 104 mEq/L (ref 96–112)
GFR calc Af Amer: 80 mL/min — ABNORMAL LOW (ref >90)
Glucose, Bld: 100 mg/dL — ABNORMAL HIGH (ref 70–99)
Potassium: 4.4 mEq/L (ref 3.5–5.1)
Sodium: 139 mEq/L (ref 135–145)
Total Bilirubin: 0.3 mg/dL (ref 0.3–1.2)
Total Protein: 7.5 g/dL (ref 6.0–8.3)

## 2012-12-07 LAB — CBC
Hemoglobin: 13.1 g/dL (ref 13.0–17.0)
MCHC: 31.8 g/dL (ref 30.0–36.0)
WBC: 7 10*3/uL (ref 4.0–10.5)

## 2012-12-07 NOTE — Progress Notes (Signed)
Labs drawn today for cbc/diff,cmp 

## 2012-12-10 ENCOUNTER — Encounter (HOSPITAL_BASED_OUTPATIENT_CLINIC_OR_DEPARTMENT_OTHER): Payer: Medicare Other | Admitting: Oncology

## 2012-12-10 ENCOUNTER — Encounter (HOSPITAL_COMMUNITY): Payer: Self-pay | Admitting: Oncology

## 2012-12-10 VITALS — BP 128/83 | HR 62 | Temp 98.0°F | Resp 18 | Wt 195.1 lb

## 2012-12-10 DIAGNOSIS — J40 Bronchitis, not specified as acute or chronic: Secondary | ICD-10-CM

## 2012-12-10 DIAGNOSIS — G8929 Other chronic pain: Secondary | ICD-10-CM

## 2012-12-10 DIAGNOSIS — M79609 Pain in unspecified limb: Secondary | ICD-10-CM

## 2012-12-10 DIAGNOSIS — E119 Type 2 diabetes mellitus without complications: Secondary | ICD-10-CM

## 2012-12-10 DIAGNOSIS — I809 Phlebitis and thrombophlebitis of unspecified site: Secondary | ICD-10-CM

## 2012-12-10 HISTORY — DX: Phlebitis and thrombophlebitis of unspecified site: I80.9

## 2012-12-10 MED ORDER — PREDNISONE (PAK) 10 MG PO TABS: 30.0000 mg | ORAL_TABLET | Freq: Every day | ORAL | Status: DC

## 2012-12-10 NOTE — Progress Notes (Signed)
[facility], [place] [address] [place] [address]  Superficial phlebitis  CURRENT THERAPY: Observation  INTERVAL HISTORY: Dennis Yu 67 y.o. male returns for  regular  visit for followup of superficial phlebitis of his right leg presenting here for the first time on 11/06/2007. He had a 1 time extension from the upper thigh in the past all the way down to his lower leg. However, he is primarily had only the lower leg involvement. He also had venous stasis ulcers in the past but none recently. He is utilizing pressure stocking.  I personally reviewed and went over laboratory results with the patient.  CBC and diff are unremarkable.  CMET is unremarkable as well.  Samons reports that about 1 month ago he was diagnosed with bronchitis by his PCP.  He was placed on an inhaler but this really has not helped with his symptoms.  He denies any fevers or chills.  He reports that he feels well other than his wheezing.  He denies a productive cough.  It does keep him up at night. The inhaler has not helped with his symptoms.   Otherwise, the patient's doing very well. His primary care physician is Dr. Felecia Shelling. Olivarez is monitoring the patient's Coumadin and treating him appropriately. He has not had any DVT or superficial phlebitis symptoms over the past year.   Hematologically, the patient denies any complaints. He denies any bleeding. He denies any rashes.     Past Medical History  Diagnosis Date  . Essential hypertension, benign   . Type 2 diabetes mellitus   . Mixed hyperlipidemia   . Vitamin D deficiency   . History of colonic polyps   . Erectile dysfunction   . 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 Patent foramen ovale   . Cardiomyopathy     LVEF 25%  . 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 Obstructive sleep apnea   . GERD (gastroesophageal reflux disease)   . Phlebitis   . Diabetes mellitus   . Bronchitis 11/2012  . Superficial phlebitis 12/10/2012     has DIABETES MELLITUS, TYPE II, CONTROLLED, W/NEURO COMPS; Mixed hyperlipidemia; Essential hypertension, benign; Encounter for long-term (current) use of anticoagulants; Embolic stroke; Patent foramen ovale; Coronary atherosclerosis of native coronary artery; Mural thrombus of left ventricle; Secondary cardiomyopathy, unspecified; and Superficial phlebitis on his problem list.     is allergic to aspirin.  Mr. Schulke had no medications administered during this visit.  History reviewed. No pertinent past surgical history.  Denies any headaches, dizziness, double vision, fevers, chills, night sweats, nausea, vomiting, diarrhea, constipation, chest pain, heart palpitations, shortness of breath, blood in stool, black tarry stool, urinary pain, urinary burning, urinary frequency, hematuria.   PHYSICAL EXAMINATION  ECOG PERFORMANCE STATUS: 0 - Asymptomatic  Filed Vitals:   12/10/12 0900  BP: 128/83  Pulse: 62  Temp: 98 F (36.7 C)  Resp: 18   GENERAL:alert, no distress, well nourished, well developed, comfortable, cooperative and smiling  SKIN: skin color, texture, turgor are normal, no rashes or significant lesions  HEAD: Normocephalic, No masses, lesions, tenderness or abnormalities  EYES: normal, EOMI, Conjunctiva are pink and non-injected  EARS: External ears normal  OROPHARYNX:lips, buccal mucosa, and tongue normal and mucous membranes are moist  NECK: supple, no adenopathy, thyroid normal size, non-tender, without nodularity, no stridor, non-tender, trachea midline  LYMPH: no palpable lymphadenopathy, no hepatosplenomegaly  BREAST:not examined  LUNGS: clear to auscultation and percussion. Bronchial wheezing  noted.  HEART: regular rate & rhythm, no murmurs, no gallops, S1 normal and S2 normal  ABDOMEN:abdomen soft, non-tender, normal bowel sounds, no masses or organomegaly and no hepatosplenomegaly  BACK: Back symmetric, no curvature., No CVA tenderness  EXTREMITIES:less then 2  second capillary refill, no joint deformities, effusion, or inflammation, no edema, no skin discoloration, no clubbing, no cyanosis. Weakness noted below in neuro exam.  NEURO: alert & oriented x 3 with fluent speech, no focal motor/sensory deficits, positive findings: muscular weakness of left side 4+/5, compared to right 5/5, and steppage gait of left LE.    LABORATORY DATA: CBC    Component Value Date/Time   WBC 7.0 12/07/2012 0832   RBC 4.69 12/07/2012 0832   HGB 13.1 12/07/2012 0832   HCT 41.2 12/07/2012 0832   PLT 197 12/07/2012 0832   MCV 87.8 12/07/2012 0832   MCH 27.9 12/07/2012 0832   MCHC 31.8 12/07/2012 0832   RDW 13.2 12/07/2012 0832   LYMPHSABS 2.2 12/07/2012 0832   MONOABS 0.7 12/07/2012 0832   EOSABS 0.3 12/07/2012 0832   BASOSABS 0.1 12/07/2012 0832      Chemistry      Component Value Date/Time   NA 139 12/07/2012 0832   K 4.4 12/07/2012 0832   CL 104 12/07/2012 0832   CO2 27 12/07/2012 0832   BUN 13 12/07/2012 0832   CREATININE 1.09 12/07/2012 0832   CREATININE 1.09 11/11/2011 0825      Component Value Date/Time   CALCIUM 9.3 12/07/2012 0832   ALKPHOS 53 12/07/2012 0832   AST 20 12/07/2012 0832   ALT 17 12/07/2012 0832   BILITOT 0.3 12/07/2012 0832     Lab Results  Component Value Date   INR 2.6 11/11/2012   INR 2.9 09/30/2012   INR 2.8 08/19/2012      ASSESSMENT:  1. Superficial phlebitis of his right leg presenting here for the first time on 11/06/2007. He had a 1 time extension from the upper thigh in the past all the way down to his lower leg. However, he is primarily had only the lower leg involvement. He also had venous stasis ulcers in the past but none recently.  2. Stroke affecting left-sided body in August 2012 3. History of smoking times many years, quit  4. Hypercholesterolemia  5. Sleep apnea syndrome.  6. 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 Diabetes mellitus times many years  8. HTN, followed by cardiology  9. CAD, followed by  cardiology  10. Patent foramen ovale, followed by cardiology  11. Mural thrombus of left ventricle, followed by cardiology 12. Bronchitis, on inhaler by PCP   PLAN:  1. I personally reviewed and went over laboratory results with the patient. 2. Labs in 1 year: CBC diff, CMET 3. Pred-Pak 30 mg daily x 4 days 4. Recommended an OTC allergy medication. 5. Recommend patient follow-up with PCP if symptoms continue. 6. Return in 1 year for follow-up.    All questions were answered. The patient knows to call the clinic with any problems, questions or concerns. We can certainly see the patient much sooner if necessary.  Patient and plan will be discussed with Dr. Mariel Sleet within the next 24 hours.    Fontaine Hehl,Pedro

## 2012-12-10 NOTE — Patient Instructions (Addendum)
[facility] Discharge Instructions  RECOMMENDATIONS MADE BY THE CONSULTANT AND ANY TEST RESULTS WILL BE SENT TO YOUR REFERRING PHYSICIAN.  A prescription for a prednisone dose pack has been sent to your pharmacy. This will help to treat your bronchitis. Continue to use your inhaler as needed. If you are still not better after finishing the prednisone dose pack you should return to see your primary care physician. Return to clinic in 1 year for lab work and then to see Dr.Neijstrom. Report any issues/concerns to clinic as needed.  Thank you for choosing [facility] to provide your oncology and hematology care.  To afford each patient quality time with our providers, please arrive at least 15 minutes before your scheduled appointment time.  With your help, our goal is to use those 15 minutes to complete the necessary work-up to ensure our physicians have the information they need to help with your evaluation and healthcare recommendations.    Effective January 1st, 2014, we ask that you re-schedule your appointment with our physicians should you arrive 10 or more minutes late for your appointment.  We strive to give you quality time with our providers, and arriving late affects you and other patients whose appointments are after yours.    Again, thank you for choosing [facility].  Our hope is that these requests will decrease the amount of time that you wait before being seen by our physicians.       _____________________________________________________________  Should you have questions after your visit to [facility], please contact our office at [phone] between the hours of 8:30 a.m. and 5:00 p.m.  Voicemails left after 4:30 p.m. will not be returned until the following business day.  For prescription refill requests, have your pharmacy contact our office with your prescription refill request.

## 2012-12-23 ENCOUNTER — Ambulatory Visit (INDEPENDENT_AMBULATORY_CARE_PROVIDER_SITE_OTHER): Payer: PRIVATE HEALTH INSURANCE | Admitting: *Deleted

## 2012-12-23 DIAGNOSIS — I6340 Cerebral infarction due to embolism of unspecified cerebral artery: Secondary | ICD-10-CM

## 2012-12-23 DIAGNOSIS — I639 Cerebral infarction, unspecified: Secondary | ICD-10-CM

## 2012-12-23 DIAGNOSIS — I6350 Cerebral infarction due to unspecified occlusion or stenosis of unspecified cerebral artery: Secondary | ICD-10-CM

## 2012-12-23 DIAGNOSIS — Z86718 Personal history of other venous thrombosis and embolism: Secondary | ICD-10-CM

## 2012-12-23 DIAGNOSIS — Z7901 Long term (current) use of anticoagulants: Secondary | ICD-10-CM

## 2013-01-01 ENCOUNTER — Ambulatory Visit (INDEPENDENT_AMBULATORY_CARE_PROVIDER_SITE_OTHER): Payer: PRIVATE HEALTH INSURANCE | Admitting: Cardiology

## 2013-01-01 ENCOUNTER — Encounter: Payer: Self-pay | Admitting: Cardiology

## 2013-01-01 ENCOUNTER — Ambulatory Visit: Payer: Medicare Other | Admitting: Cardiology

## 2013-01-01 VITALS — BP 131/88 | HR 71 | Ht 72.0 in | Wt 197.2 lb

## 2013-01-01 DIAGNOSIS — Q211 Atrial septal defect: Secondary | ICD-10-CM

## 2013-01-01 DIAGNOSIS — E782 Mixed hyperlipidemia: Secondary | ICD-10-CM

## 2013-01-01 DIAGNOSIS — I429 Cardiomyopathy, unspecified: Secondary | ICD-10-CM

## 2013-01-01 DIAGNOSIS — I513 Intracardiac thrombosis, not elsewhere classified: Secondary | ICD-10-CM

## 2013-01-01 DIAGNOSIS — Q2112 Patent foramen ovale: Secondary | ICD-10-CM

## 2013-01-01 DIAGNOSIS — I10 Essential (primary) hypertension: Secondary | ICD-10-CM

## 2013-01-01 DIAGNOSIS — R0602 Shortness of breath: Secondary | ICD-10-CM

## 2013-01-01 DIAGNOSIS — I2510 Atherosclerotic heart disease of native coronary artery without angina pectoris: Secondary | ICD-10-CM

## 2013-01-01 DIAGNOSIS — I219 Acute myocardial infarction, unspecified: Secondary | ICD-10-CM

## 2013-01-01 MED ORDER — ISOSORBIDE MONONITRATE ER 30 MG PO TB24: 30.0000 mg | ORAL_TABLET | Freq: Every day | ORAL | Status: DC

## 2013-01-01 NOTE — Assessment & Plan Note (Signed)
Expect he has multivessel disease based on previous noninvasive workup. He has not wanted to pursue invasive evaluation as yet, but is reconsidering the matter due to his persistent shortness of breath. In addition to the above also add Imdur 30 mg daily for now.

## 2013-01-01 NOTE — Assessment & Plan Note (Signed)
Continues on Zocor. 

## 2013-01-01 NOTE — Assessment & Plan Note (Signed)
He has persistent dyspnea, NYHA class 2-3. Will followup echocardiogram, start using Lasix daily for now, keep track of weights. Also add Imdur 30 mg daily for possible ischemic symptoms. We discussed possibility of pursing cardiac catheterization once again and he is considering it - will see him back over the next 3-4 weeks with BMET.

## 2013-01-01 NOTE — Progress Notes (Signed)
Clinical Summary Dennis Yu is a 67 y.o.male last seen in January of this year. He reports recent episode of possible bronchitis, treated with MDI and course of steroids. He also describes dyspnea of exertion, NYHA class 2-3, no specific chest pain. His weight fluctuates by only a few pounds, no major degree of edema. He rarely has used Lasix and wears compression hose.  Patient has preferred a conservative approach overall, declines further invasive evaluation for revascularization or device therapies.Previous Myoview showed evidence of dense scar affecting the inferoposterior walls as well as moderate sized scar in the anteroseptal distribution with mild to moderate peri-infarct ischemia at the apex of both anterior and inferior walls. We discussed this again today.  Recent lab work reviewed finding potassium 4.4, BUN 13, creatinine 1.0, AST 20, ALT 17, hemoglobin 13.1, platelets 197. ECG shows sinus rhythm with evidence of previous inferior and anterior infarcts.  He reports compliance with his medications.  Allergies  Allergen Reactions  . Aspirin     REACTION: GI upset (high dosage)    Current Outpatient Prescriptions  Medication Sig Dispense Refill  . aspirin 81 MG tablet Take 81 mg by mouth daily.        . carvedilol (COREG) 12.5 MG tablet Take 1 tablet (12.5 mg total) by mouth 2 (two) times daily with a meal.  180 tablet  3  . diclofenac sodium (VOLTAREN) 1 % GEL Apply topically as directed.        . furosemide (LASIX) 20 MG tablet Take 1 tablet (20 mg total) by mouth as needed.  30 tablet  3  . gabapentin (NEURONTIN) 300 MG capsule Take 3 capsules (900 mg total) by mouth daily.  90 capsule  1  . lisinopril (PRINIVIL,ZESTRIL) 20 MG tablet Take 1 tablet (20 mg total) by mouth daily.  90 tablet  3  . metFORMIN (GLUCOPHAGE) 500 MG tablet Take 500 mg by mouth 2 (two) times daily with a meal.        . NON FORMULARY legatrin pm Daily        . PROAIR HFA 108 (90 BASE) MCG/ACT inhaler  Inhale 2 puffs into the lungs 4 (four) times daily.       . simvastatin (ZOCOR) 20 MG tablet Take 1 tablet (20 mg total) by mouth at bedtime.  90 tablet  3  . warfarin (COUMADIN) 5 MG tablet 10 mg on Mon and Fri 5 mg all other days      . isosorbide mononitrate (IMDUR) 30 MG 24 hr tablet Take 1 tablet (30 mg total) by mouth daily.  90 tablet  1   No current facility-administered medications for this visit.    Past Medical History  Diagnosis Date  . Essential hypertension, benign   . Type 2 diabetes mellitus   . Mixed hyperlipidemia   . Vitamin D deficiency   . History of colonic polyps   . Erectile dysfunction   . 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 Patent foramen ovale   . Cardiomyopathy     LVEF 25%  . 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 Obstructive sleep apnea   . GERD (gastroesophageal reflux disease)   . Phlebitis   . Diabetes mellitus   . Bronchitis 11/2012  . Superficial phlebitis 12/10/2012    Social History Dennis Yu reports that he has quit smoking. His smoking use included Cigarettes. He smoked 0.00 packs per day. He has never used smokeless tobacco. Dennis Yu  reports that he does not drink alcohol.  Review of Systems No palpitations or new neurological deficits. Stable appetite.  Physical Examination Filed Vitals:   01/01/13 0829  BP: 131/88  Pulse: 71   Filed Weights   01/01/13 0829  Weight: 197 lb 4 oz (89.472 kg)   Comfortable at rest. HEENT: Conjuctiva and lids normal, oropharynx with moist mucosa.  Neck: Supple, no elevated JVP or carotid bruits.  Lungs: Clear to auscultation, nonlabored.  Cardiac: Regular rate and rhythm, indistinct PMI, no S3 gallop or significant murmur.  Abdomen: Soft, nontender, bowel sounds present.  Skin: Warm and dry.  Extremities: Mild edema, mild chronic swelling of the left hand.  Musculoskeletal: No kyphosis.  Neuropsychiatric: Alert and oriented x3. Has mild residual left-sided weakness, most prominent  in the upper extremity.   Problem List and Plan   Secondary cardiomyopathy, unspecified He has persistent dyspnea, NYHA class 2-3. Will followup echocardiogram, start using Lasix daily for now, keep track of weights. Also add Imdur 30 mg daily for possible ischemic symptoms. We discussed possibility of pursing cardiac catheterization once again and he is considering it - will see him back over the next 3-4 weeks with BMET.  Coronary atherosclerosis of native coronary artery Expect he has multivessel disease based on previous noninvasive workup. He has not wanted to pursue invasive evaluation as yet, but is reconsidering the matter due to his persistent shortness of breath. In addition to the above also add Imdur 30 mg daily for now.  Mural thrombus of left ventricle He continues on Coumadin.  Patent foramen ovale He continues on Coumadin, previous history of stroke. RV size and function described as normal by last echocardiogram.  Essential hypertension, benign Blood pressure control reasonable today.  Mixed hyperlipidemia Continues on Zocor.    Jonelle Sidle, M.D., F.A.C.C.

## 2013-01-01 NOTE — Assessment & Plan Note (Signed)
He continues on Coumadin, previous history of stroke. RV size and function described as normal by last echocardiogram.

## 2013-01-01 NOTE — Assessment & Plan Note (Signed)
Blood pressure control reasonable today. 

## 2013-01-01 NOTE — Patient Instructions (Addendum)
Your physician recommends that you schedule a follow-up appointment in: 3-4 weeks with SM  Your physician has requested that you have an echocardiogram. Echocardiography is a painless test that uses sound waves to create images of your heart. It provides your doctor with information about the size and shape of your heart and how well your heart's chambers and valves are working. This procedure takes approximately one hour. There are no restrictions for this procedure.  Your physician has recommended you make the following change in your medication:   1) START TAKING LASIX 20MG  DAILY INSTEAD OF AS NEEDED 2) START TAKING IMDUR 30MG  DAILY  Your physician recommends that you return for lab work in: PRIOR TO FOLLOW UP OFFICE VISIT Your physician recommends that you have follow up lab work, we will mail you a reminder letter to alert you when to go [employer], located across the street from our office.    Your physician recommends that you weigh, daily, at the same time every day, and in the same amount of clothing. Please record your daily weights on the handout provided and bring it to your next appointment.

## 2013-01-01 NOTE — Assessment & Plan Note (Signed)
He continues on Coumadin. 

## 2013-01-04 ENCOUNTER — Ambulatory Visit: Payer: Self-pay | Admitting: Cardiology

## 2013-01-07 ENCOUNTER — Ambulatory Visit (HOSPITAL_COMMUNITY)
Admission: RE | Admit: 2013-01-07 | Discharge: 2013-01-07 | Disposition: A | Discharge status: Home / Self Care | Payer: Medicare Other | Source: Ambulatory Visit | Attending: Cardiology | Admitting: Cardiology

## 2013-01-07 DIAGNOSIS — I059 Rheumatic mitral valve disease, unspecified: Secondary | ICD-10-CM

## 2013-01-07 DIAGNOSIS — I513 Intracardiac thrombosis, not elsewhere classified: Secondary | ICD-10-CM

## 2013-01-07 DIAGNOSIS — I428 Other cardiomyopathies: Secondary | ICD-10-CM | POA: Insufficient documentation

## 2013-01-07 DIAGNOSIS — I10 Essential (primary) hypertension: Secondary | ICD-10-CM | POA: Insufficient documentation

## 2013-01-07 DIAGNOSIS — I429 Cardiomyopathy, unspecified: Secondary | ICD-10-CM

## 2013-01-07 DIAGNOSIS — R0989 Other specified symptoms and signs involving the circulatory and respiratory systems: Secondary | ICD-10-CM | POA: Insufficient documentation

## 2013-01-07 DIAGNOSIS — R0609 Other forms of dyspnea: Secondary | ICD-10-CM | POA: Insufficient documentation

## 2013-01-07 DIAGNOSIS — E119 Type 2 diabetes mellitus without complications: Secondary | ICD-10-CM | POA: Insufficient documentation

## 2013-01-07 DIAGNOSIS — I2510 Atherosclerotic heart disease of native coronary artery without angina pectoris: Secondary | ICD-10-CM | POA: Insufficient documentation

## 2013-01-07 DIAGNOSIS — R0602 Shortness of breath: Secondary | ICD-10-CM

## 2013-01-07 NOTE — Progress Notes (Signed)
*  PRELIMINARY RESULTS* Echocardiogram 2D Echocardiogram has been performed.  Dennis Yu 01/07/2013, 9:50 AM

## 2013-01-20 ENCOUNTER — Encounter: Payer: Self-pay | Admitting: *Deleted

## 2013-01-25 ENCOUNTER — Encounter: Payer: Self-pay | Admitting: Cardiology

## 2013-01-25 ENCOUNTER — Ambulatory Visit (INDEPENDENT_AMBULATORY_CARE_PROVIDER_SITE_OTHER): Payer: Medicare Other | Admitting: Cardiology

## 2013-01-25 VITALS — BP 124/80 | HR 67 | Resp 17 | Ht 72.0 in | Wt 194.0 lb

## 2013-01-25 DIAGNOSIS — I2510 Atherosclerotic heart disease of native coronary artery without angina pectoris: Secondary | ICD-10-CM

## 2013-01-25 DIAGNOSIS — I513 Intracardiac thrombosis, not elsewhere classified: Secondary | ICD-10-CM

## 2013-01-25 DIAGNOSIS — I10 Essential (primary) hypertension: Secondary | ICD-10-CM

## 2013-01-25 DIAGNOSIS — I429 Cardiomyopathy, unspecified: Secondary | ICD-10-CM

## 2013-01-25 DIAGNOSIS — Q211 Atrial septal defect: Secondary | ICD-10-CM

## 2013-01-25 DIAGNOSIS — I219 Acute myocardial infarction, unspecified: Secondary | ICD-10-CM

## 2013-01-25 LAB — BASIC METABOLIC PANEL
BUN: 17 mg/dL (ref 6–23)
CO2: 28 mEq/L (ref 19–32)
Calcium: 9.7 mg/dL (ref 8.4–10.5)
Glucose, Bld: 94 mg/dL (ref 70–99)
Sodium: 138 mEq/L (ref 135–145)

## 2013-01-25 NOTE — Assessment & Plan Note (Signed)
Blood pressure control is good today. No changes made. 

## 2013-01-25 NOTE — Progress Notes (Signed)
Clinical Summary Dennis Yu is a medically complex 67 y.o.male seen recently in late May. He is here with his wife today. Since our last visit, he reports improved shortness of breath, still gets very fatigued in the afternoons. No frank angina.  Followup echocardiogram done on June 5 showed mildly dilated left ventricle with wall motion abnormalities, prominent trabeculation at the apex, no definite mural thrombus, LVEF 30%, mild mitral regurgitation, moderate left atrial enlargement, no definite ASD identified, normal RV size. We reviewed the results stay.  Followup BMET was also arranged - not completed yet. Weight is down 3 pounds from the last visit.  Today I reviewed the patient's overall cardiac status, discussed implications of underlying CAD and cardiomyopathy, also possibility of pursuing a cardiac catheterization to understand whether there were any revascularization options that might be of benefit. So far Mr. Provence has preferred a conservative approach, still remains of that mind, although is considering the possibility of further testing if his symptoms worsen.   Allergies  Allergen Reactions  . Aspirin     REACTION: GI upset (high dosage)    Current Outpatient Prescriptions  Medication Sig Dispense Refill  . aspirin 81 MG tablet Take 81 mg by mouth daily.        . carvedilol (COREG) 12.5 MG tablet Take 1 tablet (12.5 mg total) by mouth 2 (two) times daily with a meal.  180 tablet  3  . diclofenac sodium (VOLTAREN) 1 % GEL Apply topically as directed.        . furosemide (LASIX) 20 MG tablet Take 1 tablet (20 mg total) by mouth as needed.  30 tablet  3  . gabapentin (NEURONTIN) 300 MG capsule Take 3 capsules (900 mg total) by mouth daily.  90 capsule  1  . isosorbide mononitrate (IMDUR) 30 MG 24 hr tablet Take 1 tablet (30 mg total) by mouth daily.  90 tablet  1  . lisinopril (PRINIVIL,ZESTRIL) 20 MG tablet Take 1 tablet (20 mg total) by mouth daily.  90 tablet  3  .  metFORMIN (GLUCOPHAGE) 500 MG tablet Take 500 mg by mouth 2 (two) times daily with a meal.        . NON FORMULARY legatrin pm Daily        . predniSONE (DELTASONE) 10 MG tablet       . PROAIR HFA 108 (90 BASE) MCG/ACT inhaler Inhale 2 puffs into the lungs 4 (four) times daily.       . simvastatin (ZOCOR) 20 MG tablet Take 1 tablet (20 mg total) by mouth at bedtime.  90 tablet  3  . warfarin (COUMADIN) 5 MG tablet 10 mg on Mon and Fri 5 mg all other days       No current facility-administered medications for this visit.    Past Medical History  Diagnosis Date  . Essential hypertension, benign   . Type 2 diabetes mellitus   . Mixed hyperlipidemia   . Vitamin D deficiency   . History of colonic polyps   . Erectile dysfunction   . 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 Patent foramen ovale   . Cardiomyopathy     LVEF 25%  . 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 Obstructive sleep apnea   . GERD (gastroesophageal reflux disease)   . Phlebitis   . Diabetes mellitus   . Bronchitis 11/2012  . Superficial phlebitis 12/10/2012    History reviewed. No pertinent past surgical history.  Family History  Problem Relation Age of Onset  . Diabetes Father   . Hypertension      Social History Mr. Nunziata reports that he has quit smoking. His smoking use included Cigarettes. He smoked 0.00 packs per day. He has never used smokeless tobacco. Mr. Gonnella reports that he does not drink alcohol.  Review of Systems No palpitations or syncope. No reported bleeding episodes on Coumadin. Stable appetite. No orthopnea or PND. Otherwise negative.  Physical Examination Filed Vitals:   01/25/13 0902  BP: 124/80  Pulse: 67  Resp: 17   Filed Weights   01/25/13 0902  Weight: 194 lb (87.998 kg)    Comfortable at rest.  HEENT: Conjuctiva and lids normal, oropharynx with moist mucosa.  Neck: Supple, no elevated JVP or carotid bruits.  Lungs: Clear to auscultation, nonlabored.  Cardiac:  Regular rate and rhythm, indistinct PMI, no S3 gallop or significant murmur.  Abdomen: Soft, nontender, bowel sounds present.  Skin: Warm and dry.  Extremities: No pitting edema.  Musculoskeletal: No kyphosis.  Neuropsychiatric: Alert and oriented x3. Has very mild residual left-sided weakness, most prominent in the upper extremity.   Problem List and Plan   Coronary atherosclerosis of native coronary artery After discussion plan is to continue medical therapy for now. I reviewed cardiac testing to date with the patient and his wife. Recommendation is to pursue a cardiac catheterization ultimately if his symptoms worsen, he has preferred conservative approach so far however.  Secondary cardiomyopathy, unspecified Followup echocardiogram showed stable LVEF of 30%. He has preferred a conservative approach on medical therapy. Device treatments would not be considered without first understanding his coronary anatomy.  Mural thrombus of left ventricle On Coumadin, not evident by most recent echocardiogram although prominent trabeculation at the apex.  Essential hypertension, benign Blood pressure control is good today. No changes made.  Patent foramen ovale He continues on Coumadin, previous history of stroke. RV size and function  normal.    Jonelle Sidle, M.D., F.A.C.C.

## 2013-01-25 NOTE — Assessment & Plan Note (Signed)
He continues on Coumadin, previous history of stroke. RV size and function normal. 

## 2013-01-25 NOTE — Assessment & Plan Note (Signed)
Followup echocardiogram showed stable LVEF of 30%. He has preferred a conservative approach on medical therapy. Device treatments would not be considered without first understanding his coronary anatomy.

## 2013-01-25 NOTE — Assessment & Plan Note (Signed)
On Coumadin, not evident by most recent echocardiogram although prominent trabeculation at the apex.

## 2013-01-25 NOTE — Assessment & Plan Note (Signed)
After discussion plan is to continue medical therapy for now. I reviewed cardiac testing to date with the patient and his wife. Recommendation is to pursue a cardiac catheterization ultimately if his symptoms worsen, he has preferred conservative approach so far however.

## 2013-01-25 NOTE — Patient Instructions (Addendum)
Your physician recommends that you schedule a follow-up appointment in: 4-6 weeks  Your physician recommends that you return for lab work in: TODAY Counsellor GIVEN FOR BMET)

## 2013-01-26 ENCOUNTER — Encounter: Payer: Self-pay | Admitting: *Deleted

## 2013-01-26 NOTE — Addendum Note (Signed)
Addended by: Derry Lory A on: 01/26/2013 05:39 PM   Modules accepted: Orders

## 2013-02-03 ENCOUNTER — Ambulatory Visit (INDEPENDENT_AMBULATORY_CARE_PROVIDER_SITE_OTHER): Payer: Medicare Other | Admitting: *Deleted

## 2013-02-03 DIAGNOSIS — I6340 Cerebral infarction due to embolism of unspecified cerebral artery: Secondary | ICD-10-CM

## 2013-02-03 DIAGNOSIS — I6350 Cerebral infarction due to unspecified occlusion or stenosis of unspecified cerebral artery: Secondary | ICD-10-CM

## 2013-02-03 DIAGNOSIS — I639 Cerebral infarction, unspecified: Secondary | ICD-10-CM

## 2013-02-03 DIAGNOSIS — Z7901 Long term (current) use of anticoagulants: Secondary | ICD-10-CM

## 2013-02-03 DIAGNOSIS — Z86718 Personal history of other venous thrombosis and embolism: Secondary | ICD-10-CM

## 2013-02-03 LAB — POCT INR: INR: 2.9

## 2013-02-11 ENCOUNTER — Encounter: Payer: Self-pay | Admitting: *Deleted

## 2013-02-11 LAB — BASIC METABOLIC PANEL
CO2: 27 mEq/L (ref 19–32)
Calcium: 9.3 mg/dL (ref 8.4–10.5)
Creat: 1.04 mg/dL (ref 0.50–1.35)
Sodium: 139 mEq/L (ref 135–145)

## 2013-03-18 ENCOUNTER — Ambulatory Visit (INDEPENDENT_AMBULATORY_CARE_PROVIDER_SITE_OTHER): Payer: Medicare Other | Admitting: Cardiology

## 2013-03-18 ENCOUNTER — Encounter: Payer: Self-pay | Admitting: Cardiology

## 2013-03-18 ENCOUNTER — Ambulatory Visit (INDEPENDENT_AMBULATORY_CARE_PROVIDER_SITE_OTHER): Payer: PRIVATE HEALTH INSURANCE | Admitting: *Deleted

## 2013-03-18 VITALS — BP 123/84 | HR 60 | Ht 75.0 in | Wt 194.0 lb

## 2013-03-18 DIAGNOSIS — I639 Cerebral infarction, unspecified: Secondary | ICD-10-CM

## 2013-03-18 DIAGNOSIS — Z7901 Long term (current) use of anticoagulants: Secondary | ICD-10-CM

## 2013-03-18 DIAGNOSIS — Z86718 Personal history of other venous thrombosis and embolism: Secondary | ICD-10-CM

## 2013-03-18 DIAGNOSIS — I429 Cardiomyopathy, unspecified: Secondary | ICD-10-CM

## 2013-03-18 DIAGNOSIS — I2510 Atherosclerotic heart disease of native coronary artery without angina pectoris: Secondary | ICD-10-CM

## 2013-03-18 DIAGNOSIS — I6340 Cerebral infarction due to embolism of unspecified cerebral artery: Secondary | ICD-10-CM

## 2013-03-18 DIAGNOSIS — I809 Phlebitis and thrombophlebitis of unspecified site: Secondary | ICD-10-CM

## 2013-03-18 DIAGNOSIS — I6350 Cerebral infarction due to unspecified occlusion or stenosis of unspecified cerebral artery: Secondary | ICD-10-CM

## 2013-03-18 MED ORDER — CARVEDILOL 12.5 MG PO TABS: 12.5000 mg | ORAL_TABLET | Freq: Two times a day (BID) | ORAL | Status: DC

## 2013-03-18 MED ORDER — WARFARIN SODIUM 5 MG PO TABS: 5.0000 mg | ORAL_TABLET | Freq: Every day | ORAL | Status: DC

## 2013-03-18 MED ORDER — FUROSEMIDE 20 MG PO TABS: 20.0000 mg | ORAL_TABLET | ORAL | Status: DC | PRN

## 2013-03-18 NOTE — Progress Notes (Signed)
Clinical Summary Dennis Yu is a 67 y.o.male last seen in June. He reports that he is actually feeling better, less short of breath, no chest pain. Still not inclined to pursue invasive testing. He reports compliance with his medications.  Lab work from June showed potassium 5.7, BUN 17, creatinine 1.2. He was not on potassium supplements, Lasix dose was cut back at that time. Repeat lab work in July showed potassium 4.5, BUN 16, creatinine 1.0. Weight is stable without change compared to last visit.  Echocardiogram done on June 5 showed mildly dilated left ventricle with wall motion abnormalities, prominent trabeculation at the apex, no definite mural thrombus, LVEF 30%, mild mitral regurgitation, moderate left atrial enlargement, no definite ASD identified, normal RV size.   Allergies  Allergen Reactions  . Aspirin     REACTION: GI upset (high dosage)    Current Outpatient Prescriptions  Medication Sig Dispense Refill  . aspirin 81 MG tablet Take 81 mg by mouth daily.        . carvedilol (COREG) 12.5 MG tablet Take 1 tablet (12.5 mg total) by mouth 2 (two) times daily with a meal.  180 tablet  3  . diclofenac sodium (VOLTAREN) 1 % GEL Apply topically as directed.        . furosemide (LASIX) 20 MG tablet Take 1 tablet (20 mg total) by mouth as needed.  30 tablet  3  . gabapentin (NEURONTIN) 300 MG capsule Take 3 capsules (900 mg total) by mouth daily.  90 capsule  1  . isosorbide mononitrate (IMDUR) 30 MG 24 hr tablet Take 1 tablet (30 mg total) by mouth daily.  90 tablet  1  . lisinopril (PRINIVIL,ZESTRIL) 20 MG tablet Take 1 tablet (20 mg total) by mouth daily.  90 tablet  3  . metFORMIN (GLUCOPHAGE) 500 MG tablet Take 500 mg by mouth 2 (two) times daily with a meal.        . NON FORMULARY legatrin pm Daily        . PROAIR HFA 108 (90 BASE) MCG/ACT inhaler Inhale 2 puffs into the lungs 4 (four) times daily.       . simvastatin (ZOCOR) 20 MG tablet Take 1 tablet (20 mg total) by  mouth at bedtime.  90 tablet  3  . warfarin (COUMADIN) 5 MG tablet 10 mg on Mon and Fri 5 mg all other days       No current facility-administered medications for this visit.    Past Medical History  Diagnosis Date  . Essential hypertension, benign   . Type 2 diabetes mellitus   . Mixed hyperlipidemia   . Vitamin D deficiency   . History of colonic polyps   . Erectile dysfunction   . 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 Patent foramen ovale   . Cardiomyopathy     LVEF 25%  . 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 Obstructive sleep apnea   . GERD (gastroesophageal reflux disease)   . Phlebitis   . Diabetes mellitus   . Bronchitis 11/2012  . Superficial phlebitis 12/10/2012    History reviewed. No pertinent past surgical history.  Family History  Problem Relation Age of Onset  . Diabetes Father   . Hypertension      Social History [name] reports that he has quit smoking. His smoking use included Cigarettes. He smoked 0.00 packs per day. He has never used smokeless tobacco. [name] reports that he  does not drink alcohol.  Review of Systems No leg edema. Stable appetite. No orthopnea or PND. Otherwise negative.  Physical Examination Filed Vitals:   03/18/13 0812  BP: 123/84  Pulse: 60   Filed Weights   03/18/13 0812  Weight: 194 lb (87.998 kg)    Comfortable at rest.  HEENT: Conjuctiva and lids normal, oropharynx with moist mucosa.  Neck: Supple, no elevated JVP or carotid bruits.  Lungs: Clear to auscultation, nonlabored.  Cardiac: Regular rate and rhythm, indistinct PMI, no S3 gallop or significant murmur.  Abdomen: Soft, nontender, bowel sounds present.  Skin: Warm and dry.  Extremities: No pitting edema.    Problem List and Plan   Secondary cardiomyopathy, unspecified Doing well in terms of chronic systolic heart failure, improved symptoms, stable weight. Tolerating the reduction in Lasix that we made previously.  Coronary  atherosclerosis of native coronary artery Still managing medically at this time. Patient has not been inclined to pursue invasive testing. We will continue observation.  Embolic stroke He continues on Coumadin, prior history of LV thrombus although not seen on most recent echocardiogram, also reported PFO, although no normal RV size.    Jonelle Sidle, M.D., F.A.C.C.

## 2013-03-18 NOTE — Patient Instructions (Addendum)
Your physician recommends that you schedule a follow-up appointment in: 4 months  

## 2013-03-18 NOTE — Assessment & Plan Note (Signed)
Doing well in terms of chronic systolic heart failure, improved symptoms, stable weight. Tolerating the reduction in Lasix that we made previously.

## 2013-03-18 NOTE — Addendum Note (Signed)
Addended by: Derry Lory A on: 03/18/2013 08:42 AM   Modules accepted: Orders

## 2013-03-18 NOTE — Assessment & Plan Note (Signed)
He continues on Coumadin, prior history of LV thrombus although not seen on most recent echocardiogram, also reported PFO, although no normal RV size.

## 2013-03-18 NOTE — Assessment & Plan Note (Signed)
Still managing medically at this time. Patient has not been inclined to pursue invasive testing. We will continue observation.

## 2013-04-29 ENCOUNTER — Ambulatory Visit (INDEPENDENT_AMBULATORY_CARE_PROVIDER_SITE_OTHER): Payer: Medicare Other | Admitting: *Deleted

## 2013-04-29 DIAGNOSIS — I639 Cerebral infarction, unspecified: Secondary | ICD-10-CM

## 2013-04-29 DIAGNOSIS — I6340 Cerebral infarction due to embolism of unspecified cerebral artery: Secondary | ICD-10-CM

## 2013-04-29 DIAGNOSIS — Z7901 Long term (current) use of anticoagulants: Secondary | ICD-10-CM

## 2013-04-29 LAB — POCT INR: INR: 2.2

## 2013-06-10 ENCOUNTER — Ambulatory Visit (INDEPENDENT_AMBULATORY_CARE_PROVIDER_SITE_OTHER): Payer: Medicare Other | Admitting: *Deleted

## 2013-06-10 DIAGNOSIS — Z86718 Personal history of other venous thrombosis and embolism: Secondary | ICD-10-CM

## 2013-06-10 DIAGNOSIS — I6340 Cerebral infarction due to embolism of unspecified cerebral artery: Secondary | ICD-10-CM

## 2013-06-10 DIAGNOSIS — I6350 Cerebral infarction due to unspecified occlusion or stenosis of unspecified cerebral artery: Secondary | ICD-10-CM

## 2013-06-10 DIAGNOSIS — Z7901 Long term (current) use of anticoagulants: Secondary | ICD-10-CM

## 2013-06-10 DIAGNOSIS — I639 Cerebral infarction, unspecified: Secondary | ICD-10-CM

## 2013-06-10 DIAGNOSIS — Z5181 Encounter for therapeutic drug level monitoring: Secondary | ICD-10-CM

## 2013-06-10 LAB — POCT INR: INR: 2.1

## 2013-06-22 ENCOUNTER — Telehealth: Payer: Self-pay | Admitting: *Deleted

## 2013-06-22 DIAGNOSIS — I809 Phlebitis and thrombophlebitis of unspecified site: Secondary | ICD-10-CM

## 2013-06-22 MED ORDER — SIMVASTATIN 20 MG PO TABS: 20.0000 mg | ORAL_TABLET | Freq: Every day | ORAL | Status: DC

## 2013-06-22 MED ORDER — WARFARIN SODIUM 5 MG PO TABS: 5.0000 mg | ORAL_TABLET | Freq: Every day | ORAL | Status: DC

## 2013-06-22 MED ORDER — CARVEDILOL 12.5 MG PO TABS: 12.5000 mg | ORAL_TABLET | Freq: Two times a day (BID) | ORAL | Status: DC

## 2013-06-22 NOTE — Telephone Encounter (Signed)
rx sent to pharmacy by e-script  

## 2013-06-22 NOTE — Telephone Encounter (Signed)
Pt stopped in requesting warfrin, carvedilol  and zocor to be called in [facility]

## 2013-07-22 ENCOUNTER — Ambulatory Visit (INDEPENDENT_AMBULATORY_CARE_PROVIDER_SITE_OTHER): Payer: Medicare Other | Admitting: *Deleted

## 2013-07-22 DIAGNOSIS — Z86718 Personal history of other venous thrombosis and embolism: Secondary | ICD-10-CM

## 2013-07-22 DIAGNOSIS — I639 Cerebral infarction, unspecified: Secondary | ICD-10-CM

## 2013-07-22 DIAGNOSIS — I6340 Cerebral infarction due to embolism of unspecified cerebral artery: Secondary | ICD-10-CM

## 2013-07-22 DIAGNOSIS — I6350 Cerebral infarction due to unspecified occlusion or stenosis of unspecified cerebral artery: Secondary | ICD-10-CM

## 2013-07-22 DIAGNOSIS — Z7901 Long term (current) use of anticoagulants: Secondary | ICD-10-CM

## 2013-07-22 LAB — POCT INR: INR: 2.3

## 2013-08-02 ENCOUNTER — Encounter: Payer: Self-pay | Admitting: Cardiology

## 2013-08-02 ENCOUNTER — Ambulatory Visit (INDEPENDENT_AMBULATORY_CARE_PROVIDER_SITE_OTHER): Payer: Medicare Other | Admitting: Cardiology

## 2013-08-02 VITALS — BP 136/74 | HR 65 | Ht 72.0 in | Wt 196.0 lb

## 2013-08-02 DIAGNOSIS — I219 Acute myocardial infarction, unspecified: Secondary | ICD-10-CM

## 2013-08-02 DIAGNOSIS — I429 Cardiomyopathy, unspecified: Secondary | ICD-10-CM

## 2013-08-02 DIAGNOSIS — I513 Intracardiac thrombosis, not elsewhere classified: Secondary | ICD-10-CM

## 2013-08-02 DIAGNOSIS — I6340 Cerebral infarction due to embolism of unspecified cerebral artery: Secondary | ICD-10-CM

## 2013-08-02 DIAGNOSIS — I2510 Atherosclerotic heart disease of native coronary artery without angina pectoris: Secondary | ICD-10-CM

## 2013-08-02 DIAGNOSIS — I639 Cerebral infarction, unspecified: Secondary | ICD-10-CM

## 2013-08-02 NOTE — Assessment & Plan Note (Signed)
Previous history, symptomatically stable. He is on both aspirin and Coumadin, history of PFO and LV mural thrombus. He has declined invasive evaluations.

## 2013-08-02 NOTE — Progress Notes (Signed)
Clinical Summary Dennis Yu is a 67 y.o.male last seen in August. He tells me that he has been doing well, no progressive shortness of breath over NYHA class II, no chest pain or palpitations.  Weight relatively stable, but only 2 pounds since last visit. No orthopnea or leg edema.  Echocardiogram done in June showed mildly dilated left ventricle with wall motion abnormalities, prominent trabeculation at the apex, no definite mural thrombus, LVEF 30%, mild mitral regurgitation, moderate left atrial enlargement, no definite ASD identified, normal RV size. He has preferred medical therapy for treatment of cardiomyopathy. He does has evidence of ischemic heart disease based on prior noninvasive testing.   Allergies  Allergen Reactions  . Aspirin     REACTION: GI upset (high dosage)    Current Outpatient Prescriptions  Medication Sig Dispense Refill  . aspirin 81 MG tablet Take 81 mg by mouth daily.        . carvedilol (COREG) 12.5 MG tablet Take 1 tablet (12.5 mg total) by mouth 2 (two) times daily with a meal.  180 tablet  1  . diclofenac sodium (VOLTAREN) 1 % GEL Apply topically as directed.        . furosemide (LASIX) 20 MG tablet Take 1 tablet (20 mg total) by mouth as needed.  30 tablet  3  . gabapentin (NEURONTIN) 300 MG capsule Take 3 capsules (900 mg total) by mouth daily.  90 capsule  1  . isosorbide mononitrate (IMDUR) 30 MG 24 hr tablet Take 1 tablet (30 mg total) by mouth daily.  90 tablet  1  . lisinopril (PRINIVIL,ZESTRIL) 20 MG tablet Take 1 tablet (20 mg total) by mouth daily.  90 tablet  3  . metFORMIN (GLUCOPHAGE) 500 MG tablet Take 500 mg by mouth 2 (two) times daily with a meal.        . NON FORMULARY legatrin pm Daily        . PROAIR HFA 108 (90 BASE) MCG/ACT inhaler Inhale 2 puffs into the lungs 4 (four) times daily.       . simvastatin (ZOCOR) 20 MG tablet Take 1 tablet (20 mg total) by mouth at bedtime.  90 tablet  1  . warfarin (COUMADIN) 5 MG tablet Take 1  tablet (5 mg total) by mouth daily. 10 mg on Mon and Fri 5 mg all other days  135 tablet  0   No current facility-administered medications for this visit.    Past Medical History  Diagnosis Date  . Essential hypertension, benign   . Type 2 diabetes mellitus   . Mixed hyperlipidemia   . Vitamin D deficiency   . History of colonic polyps   . Erectile dysfunction   . 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 Patent foramen ovale   . Cardiomyopathy     LVEF 25%  . 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 Obstructive sleep apnea   . GERD (gastroesophageal reflux disease)   . Phlebitis   . Diabetes mellitus   . Bronchitis 11/2012  . Superficial phlebitis 12/10/2012    Social History Dennis Yu reports that he has quit smoking. His smoking use included Cigarettes. He smoked 0.00 packs per day. He has never used smokeless tobacco. Dennis Yu reports that he does not drink alcohol.  Review of Systems As outlined above. Otherwise with stable appetite, no bleeding problems, no syncope.  Physical Examination Filed Vitals:   08/02/13 0815  BP: 136/74  Pulse: 65   Filed Weights   08/02/13 0815  Weight: 196 lb (88.905 kg)    Appears comfortable at rest.  HEENT: Conjuctiva and lids normal, oropharynx with moist mucosa.  Neck: Supple, no elevated JVP or carotid bruits.  Lungs: Clear to auscultation, nonlabored.  Cardiac: Regular rate and rhythm, indistinct PMI, no S3 gallop or significant murmur.  Abdomen: Soft, nontender, bowel sounds present.  Skin: Warm and dry.  Extremities: No pitting edema.    Problem List and Plan   Secondary cardiomyopathy, unspecified He continues to prefer conservative medical therapy for management, LVEF approximately 30%. No changes made today in regimen.  Mural thrombus of left ventricle Continues on Coumadin, thrombus was not definitively demonstrated by most recent echocardiogram, prominent apical trabeculation seen.  Coronary  atherosclerosis of native coronary artery Symptomatically stable, no active angina. He has declined pursuing invasive cardiac testing so far.  Embolic stroke Previous history, symptomatically stable. He is on both aspirin and Coumadin, history of PFO and LV mural thrombus. He has declined invasive evaluations.    Jonelle Sidle, M.D., F.A.C.C.

## 2013-08-02 NOTE — Assessment & Plan Note (Signed)
He continues to prefer conservative medical therapy for management, LVEF approximately 30%. No changes made today in regimen. 

## 2013-08-02 NOTE — Assessment & Plan Note (Signed)
Symptomatically stable, no active angina. He has declined pursuing invasive cardiac testing so far.

## 2013-08-02 NOTE — Assessment & Plan Note (Signed)
Continues on Coumadin, thrombus was not definitively demonstrated by most recent echocardiogram, prominent apical trabeculation seen. 

## 2013-08-02 NOTE — Patient Instructions (Signed)
Your physician recommends that you schedule a follow-up appointment in: 6 months with Dr McDowell You will receive a reminder letter two months in advance reminding you to call and schedule your appointment. If you don't receive this letter, please contact our office.  Your physician recommends that you continue on your current medications as directed. Please refer to the Current Medication list given to you today.   

## 2013-09-02 ENCOUNTER — Ambulatory Visit (INDEPENDENT_AMBULATORY_CARE_PROVIDER_SITE_OTHER): Payer: Medicare Other | Admitting: *Deleted

## 2013-09-02 DIAGNOSIS — Z7901 Long term (current) use of anticoagulants: Secondary | ICD-10-CM

## 2013-09-02 DIAGNOSIS — I6350 Cerebral infarction due to unspecified occlusion or stenosis of unspecified cerebral artery: Secondary | ICD-10-CM

## 2013-09-02 DIAGNOSIS — I639 Cerebral infarction, unspecified: Secondary | ICD-10-CM

## 2013-09-02 DIAGNOSIS — Z86718 Personal history of other venous thrombosis and embolism: Secondary | ICD-10-CM

## 2013-09-02 DIAGNOSIS — Z5181 Encounter for therapeutic drug level monitoring: Secondary | ICD-10-CM

## 2013-09-02 DIAGNOSIS — I6340 Cerebral infarction due to embolism of unspecified cerebral artery: Secondary | ICD-10-CM

## 2013-09-02 LAB — POCT INR: INR: 2.9

## 2013-10-11 ENCOUNTER — Other Ambulatory Visit: Payer: Self-pay | Admitting: Cardiology

## 2013-10-11 DIAGNOSIS — I809 Phlebitis and thrombophlebitis of unspecified site: Secondary | ICD-10-CM

## 2013-10-11 MED ORDER — WARFARIN SODIUM 5 MG PO TABS: 5.0000 mg | ORAL_TABLET | Freq: Every day | ORAL | Status: DC

## 2013-10-11 MED ORDER — LISINOPRIL 20 MG PO TABS: 20.0000 mg | ORAL_TABLET | Freq: Every day | ORAL | Status: DC

## 2013-10-14 ENCOUNTER — Ambulatory Visit (INDEPENDENT_AMBULATORY_CARE_PROVIDER_SITE_OTHER): Payer: Medicare Other | Admitting: *Deleted

## 2013-10-14 DIAGNOSIS — I6340 Cerebral infarction due to embolism of unspecified cerebral artery: Secondary | ICD-10-CM

## 2013-10-14 DIAGNOSIS — Z5181 Encounter for therapeutic drug level monitoring: Secondary | ICD-10-CM

## 2013-10-14 DIAGNOSIS — Z7901 Long term (current) use of anticoagulants: Secondary | ICD-10-CM

## 2013-10-14 DIAGNOSIS — Z86718 Personal history of other venous thrombosis and embolism: Secondary | ICD-10-CM

## 2013-10-14 DIAGNOSIS — I639 Cerebral infarction, unspecified: Secondary | ICD-10-CM

## 2013-10-14 DIAGNOSIS — I6350 Cerebral infarction due to unspecified occlusion or stenosis of unspecified cerebral artery: Secondary | ICD-10-CM

## 2013-10-14 LAB — POCT INR: INR: 3.1

## 2013-11-25 ENCOUNTER — Ambulatory Visit (INDEPENDENT_AMBULATORY_CARE_PROVIDER_SITE_OTHER): Payer: Medicare Other | Admitting: *Deleted

## 2013-11-25 DIAGNOSIS — I6350 Cerebral infarction due to unspecified occlusion or stenosis of unspecified cerebral artery: Secondary | ICD-10-CM

## 2013-11-25 DIAGNOSIS — Z7901 Long term (current) use of anticoagulants: Secondary | ICD-10-CM

## 2013-11-25 DIAGNOSIS — I639 Cerebral infarction, unspecified: Secondary | ICD-10-CM

## 2013-11-25 DIAGNOSIS — Z86718 Personal history of other venous thrombosis and embolism: Secondary | ICD-10-CM

## 2013-11-25 DIAGNOSIS — Z5181 Encounter for therapeutic drug level monitoring: Secondary | ICD-10-CM

## 2013-11-25 DIAGNOSIS — I6340 Cerebral infarction due to embolism of unspecified cerebral artery: Secondary | ICD-10-CM

## 2013-11-25 LAB — POCT INR: INR: 2.7

## 2013-12-06 ENCOUNTER — Encounter (HOSPITAL_COMMUNITY): Payer: Medicare Other | Attending: Hematology and Oncology

## 2013-12-06 DIAGNOSIS — Q211 Atrial septal defect: Secondary | ICD-10-CM | POA: Diagnosis not present

## 2013-12-06 DIAGNOSIS — Z09 Encounter for follow-up examination after completed treatment for conditions other than malignant neoplasm: Secondary | ICD-10-CM | POA: Diagnosis present

## 2013-12-06 DIAGNOSIS — F528 Other sexual dysfunction not due to a substance or known physiological condition: Secondary | ICD-10-CM | POA: Diagnosis not present

## 2013-12-06 DIAGNOSIS — I2510 Atherosclerotic heart disease of native coronary artery without angina pectoris: Secondary | ICD-10-CM | POA: Insufficient documentation

## 2013-12-06 DIAGNOSIS — I10 Essential (primary) hypertension: Secondary | ICD-10-CM | POA: Insufficient documentation

## 2013-12-06 DIAGNOSIS — I809 Phlebitis and thrombophlebitis of unspecified site: Secondary | ICD-10-CM | POA: Diagnosis not present

## 2013-12-06 DIAGNOSIS — Z87891 Personal history of nicotine dependence: Secondary | ICD-10-CM | POA: Diagnosis not present

## 2013-12-06 DIAGNOSIS — Z8673 Personal history of transient ischemic attack (TIA), and cerebral infarction without residual deficits: Secondary | ICD-10-CM | POA: Insufficient documentation

## 2013-12-06 DIAGNOSIS — E782 Mixed hyperlipidemia: Secondary | ICD-10-CM | POA: Insufficient documentation

## 2013-12-06 DIAGNOSIS — E7800 Pure hypercholesterolemia, unspecified: Secondary | ICD-10-CM | POA: Insufficient documentation

## 2013-12-06 DIAGNOSIS — I428 Other cardiomyopathies: Secondary | ICD-10-CM | POA: Diagnosis not present

## 2013-12-06 DIAGNOSIS — E119 Type 2 diabetes mellitus without complications: Secondary | ICD-10-CM | POA: Diagnosis not present

## 2013-12-06 DIAGNOSIS — G4733 Obstructive sleep apnea (adult) (pediatric): Secondary | ICD-10-CM | POA: Diagnosis not present

## 2013-12-06 DIAGNOSIS — Z86718 Personal history of other venous thrombosis and embolism: Secondary | ICD-10-CM | POA: Insufficient documentation

## 2013-12-06 DIAGNOSIS — Q2111 Secundum atrial septal defect: Secondary | ICD-10-CM | POA: Diagnosis not present

## 2013-12-06 LAB — COMPREHENSIVE METABOLIC PANEL
ALT: 14 U/L (ref 0–53)
AST: 22 U/L (ref 0–37)
Albumin: 4 g/dL (ref 3.5–5.2)
Alkaline Phosphatase: 52 U/L (ref 39–117)
BILIRUBIN TOTAL: 0.3 mg/dL (ref 0.3–1.2)
BUN: 20 mg/dL (ref 6–23)
CALCIUM: 9.3 mg/dL (ref 8.4–10.5)
CHLORIDE: 104 meq/L (ref 96–112)
CO2: 24 meq/L (ref 19–32)
Creatinine, Ser: 1.13 mg/dL (ref 0.50–1.35)
GFR calc Af Amer: 76 mL/min — ABNORMAL LOW (ref >90)
GFR calc non Af Amer: 66 mL/min — ABNORMAL LOW (ref >90)
Glucose, Bld: 114 mg/dL — ABNORMAL HIGH (ref 70–99)
POTASSIUM: 4.4 meq/L (ref 3.7–5.3)
Sodium: 140 mEq/L (ref 137–147)
Total Protein: 7.7 g/dL (ref 6.0–8.3)

## 2013-12-06 LAB — CBC WITH DIFFERENTIAL/PLATELET
BASOS ABS: 0 10*3/uL (ref 0.0–0.1)
Basophils Relative: 1 % (ref 0–1)
Eosinophils Absolute: 0.2 10*3/uL (ref 0.0–0.7)
Eosinophils Relative: 4 % (ref 0–5)
HCT: 41.4 % (ref 39.0–52.0)
Hemoglobin: 13.3 g/dL (ref 13.0–17.0)
LYMPHS PCT: 33 % (ref 12–46)
Lymphs Abs: 2.1 10*3/uL (ref 0.7–4.0)
MCH: 28.3 pg (ref 26.0–34.0)
MCHC: 32.1 g/dL (ref 30.0–36.0)
MCV: 88.1 fL (ref 78.0–100.0)
Monocytes Absolute: 0.4 10*3/uL (ref 0.1–1.0)
Monocytes Relative: 6 % (ref 3–12)
NEUTROS ABS: 3.5 10*3/uL (ref 1.7–7.7)
NEUTROS PCT: 56 % (ref 43–77)
PLATELETS: 217 10*3/uL (ref 150–400)
RBC: 4.7 MIL/uL (ref 4.22–5.81)
RDW: 12.9 % (ref 11.5–15.5)
WBC: 6.2 10*3/uL (ref 4.0–10.5)

## 2013-12-06 NOTE — Progress Notes (Signed)
Labs drawn today for cbc,diff,cmp 

## 2013-12-06 NOTE — Progress Notes (Signed)
Dennis Fire, MD [place] [address]  Superficial phlebitis - Plan: CBC with Differential, Comprehensive metabolic panel, D-dimer, quantitative  DIABETES MELLITUS, TYPE II, CONTROLLED, W/NEURO COMPS - Plan: gabapentin (NEURONTIN) 300 MG capsule, Comprehensive metabolic panel  CURRENT THERAPY: Observation  INTERVAL HISTORY: Dennis Yu 68 y.o. male returns for  regular  visit for followup of superficial phlebitis of his right leg presenting here for the first time on 11/06/2007. He had a 1 time extension from the upper thigh in the past all the way down to his lower leg. However, he is primarily had only the lower leg involvement. He also had venous stasis ulcers in the past but none recently. He is utilizing pressure stocking.  I personally reviewed and went over laboratory results with the patient.  The results are noted within this dictation.  His labs are very stable.  The patient is having his INR's monitored by his cardiologist.  Since we are not participating in his care at this time, I recommended release from the clinic, but the patient wishes to continue annual follow-up with [place].  I will oblige and we will see him back next year.  Hematologically, he denies any complaints and ROS questioning is negative.    Past Medical History  Diagnosis Date  . Essential hypertension, benign   . Type 2 diabetes mellitus   . Mixed hyperlipidemia   . Vitamin D deficiency   . History of colonic polyps   . Erectile dysfunction   . 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 Patent foramen ovale   . Cardiomyopathy     LVEF 25%  . 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 Obstructive sleep apnea   . GERD (gastroesophageal reflux disease)   . Phlebitis   . Diabetes mellitus   . Bronchitis 11/2012  . Superficial phlebitis 12/10/2012    has DIABETES MELLITUS, TYPE II, CONTROLLED, W/NEURO COMPS; Mixed hyperlipidemia; Essential hypertension, benign;  Encounter for long-term (current) use of anticoagulants; Embolic stroke; Patent foramen ovale; Coronary atherosclerosis of native coronary artery; Mural thrombus of left ventricle; Secondary cardiomyopathy, unspecified; Superficial phlebitis; and Encounter for therapeutic drug monitoring on his problem list.     is allergic to aspirin.  Mr. Grainger had no medications administered during this visit.  History reviewed. No pertinent past surgical history.  Denies any headaches, dizziness, double vision, fevers, chills, night sweats, nausea, vomiting, diarrhea, constipation, chest pain, heart palpitations, shortness of breath, blood in stool, black tarry stool, urinary pain, urinary burning, urinary frequency, hematuria.   PHYSICAL EXAMINATION  ECOG PERFORMANCE STATUS: 1 - Symptomatic but completely ambulatory  Filed Vitals:   12/08/13 0921  BP: 116/76  Pulse: 63  Temp: 97.2 F (36.2 C)  Resp: 16    GENERAL:alert, no distress, well nourished, well developed, comfortable, cooperative and smiling SKIN: skin color, texture, turgor are normal, no rashes or significant lesions HEAD: Normocephalic, No masses, lesions, tenderness or abnormalities EYES: normal, PERRLA, EOMI, Conjunctiva are pink and non-injected EARS: External ears normal OROPHARYNX:mucous membranes are moist  NECK: supple, no adenopathy, trachea midline LYMPH:  no palpable lymphadenopathy BREAST:not examined LUNGS: clear to auscultation and percussion HEART: regular rate & rhythm, no murmurs and no gallops ABDOMEN:abdomen soft and normal bowel sounds BACK: Back symmetric, no curvature. EXTREMITIES:less then 2 second capillary refill, no joint deformities, effusion, or inflammation, no skin discoloration, no clubbing, no cyanosis  NEURO: alert & oriented x 3 with fluent speech, no  focal motor/sensory deficits, gait normal    LABORATORY DATA: CBC    Component Value Date/Time   WBC 6.2 12/06/2013 0819   RBC 4.70 12/06/2013  0819   HGB 13.3 12/06/2013 0819   HCT 41.4 12/06/2013 0819   PLT 217 12/06/2013 0819   MCV 88.1 12/06/2013 0819   MCH 28.3 12/06/2013 0819   MCHC 32.1 12/06/2013 0819   RDW 12.9 12/06/2013 0819   LYMPHSABS 2.1 12/06/2013 0819   MONOABS 0.4 12/06/2013 0819   EOSABS 0.2 12/06/2013 0819   BASOSABS 0.0 12/06/2013 0819      Chemistry      Component Value Date/Time   NA 140 12/06/2013 0819   K 4.4 12/06/2013 0819   CL 104 12/06/2013 0819   CO2 24 12/06/2013 0819   BUN 20 12/06/2013 0819   CREATININE 1.13 12/06/2013 0819   CREATININE 1.04 02/10/2013 0825      Component Value Date/Time   CALCIUM 9.3 12/06/2013 0819   ALKPHOS 52 12/06/2013 0819   AST 22 12/06/2013 0819   ALT 14 12/06/2013 0819   BILITOT 0.3 12/06/2013 0819        ASSESSMENT:  1. Superficial phlebitis of his right leg presenting here for the first time on 11/06/2007. He had a 1 time extension from the upper thigh in the past all the way down to his lower leg. However, he is primarily had only the lower leg involvement. He also had venous stasis ulcers in the past but none recently.  2. Stroke affecting left-sided body in August 2012  3. History of smoking times many years, quit  4. Hypercholesterolemia  5. Sleep apnea syndrome.  6. 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 Diabetes mellitus times many years  8. HTN, followed by cardiology  9. CAD, followed by cardiology  10. Patent foramen ovale, followed by cardiology  11. Mural thrombus of left ventricle, followed by cardiology    Patient Active Problem List   Diagnosis Date Noted  . Encounter for therapeutic drug monitoring 09/02/2013  . Superficial phlebitis 12/10/2012  . Secondary cardiomyopathy, unspecified 09/03/2011  . Encounter for long-term (current) use of anticoagulants 05/27/2011  . Embolic stroke 00/93/8182  . Patent foramen ovale 05/27/2011  . Coronary atherosclerosis of native coronary artery 05/27/2011  . Mural thrombus of left ventricle  05/27/2011  . DIABETES MELLITUS, TYPE II, CONTROLLED, W/NEURO COMPS 07/01/2006  . Mixed hyperlipidemia 07/01/2006  . Essential hypertension, benign 07/01/2006     PLAN:  1. I personally reviewed and went over laboratory results with the patient.  The results are noted within this dictation. 2. Recommend follow-up with PCP as directed 3. Follow-up with cardiology as directed 4. Refill on Gabapentin 900 mg with 11 refills sent to his mail order pharmacy.  5. Recommended release from our clinic, but the patient wishes to continue annual follow-up.  We will see him in 1 year.  If all is well, will re-recommend release.  If refused, I will consider changing his follow-up to every 18 months.    THERAPY PLAN:  From a hematology standpoint, he is very stable and we are not participating in his care actively.  As a result, I recommended he be released from the [facility], but he was not accepting of this.  Therefore, we will see him back in 1 year.  All questions were answered. The patient knows to call the clinic with any problems, questions or concerns. We can certainly  see the patient much sooner if necessary.  Patient and plan discussed with Dr. Farrel Gobble and he is in agreement with the aforementioned.   ANSHUL MEDDINGS 12/08/2013

## 2013-12-08 ENCOUNTER — Encounter (HOSPITAL_BASED_OUTPATIENT_CLINIC_OR_DEPARTMENT_OTHER): Payer: Medicare Other | Admitting: Oncology

## 2013-12-08 ENCOUNTER — Encounter (HOSPITAL_COMMUNITY): Payer: Self-pay | Admitting: Oncology

## 2013-12-08 VITALS — BP 116/76 | HR 63 | Temp 97.2°F | Resp 16 | Wt 195.0 lb

## 2013-12-08 DIAGNOSIS — E1149 Type 2 diabetes mellitus with other diabetic neurological complication: Secondary | ICD-10-CM

## 2013-12-08 DIAGNOSIS — Z8673 Personal history of transient ischemic attack (TIA), and cerebral infarction without residual deficits: Secondary | ICD-10-CM

## 2013-12-08 DIAGNOSIS — I8000 Phlebitis and thrombophlebitis of superficial vessels of unspecified lower extremity: Secondary | ICD-10-CM

## 2013-12-08 DIAGNOSIS — E119 Type 2 diabetes mellitus without complications: Secondary | ICD-10-CM

## 2013-12-08 DIAGNOSIS — M79609 Pain in unspecified limb: Secondary | ICD-10-CM

## 2013-12-08 DIAGNOSIS — G8929 Other chronic pain: Secondary | ICD-10-CM

## 2013-12-08 DIAGNOSIS — I809 Phlebitis and thrombophlebitis of unspecified site: Secondary | ICD-10-CM

## 2013-12-08 DIAGNOSIS — I10 Essential (primary) hypertension: Secondary | ICD-10-CM

## 2013-12-08 MED ORDER — GABAPENTIN 300 MG PO CAPS: 900.0000 mg | ORAL_CAPSULE | Freq: Every day | ORAL | Status: AC

## 2013-12-08 NOTE — Patient Instructions (Addendum)
[facility] Discharge Instructions  RECOMMENDATIONS MADE BY THE CONSULTANT AND ANY TEST RESULTS WILL BE SENT TO YOUR REFERRING PHYSICIAN.  EXAM FINDINGS BY THE PHYSICIAN TODAY AND SIGNS OR SYMPTOMS TO REPORT TO CLINIC OR PRIMARY PHYSICIAN: Exam and findings as discussed by Robynn Pane, PA-C.  Will see you back in 1 year.  Contact our office with any concerns.  MEDICATIONS PRESCRIBED:  none  INSTRUCTIONS/FOLLOW-UP: 1 year.  Thank you for choosing [facility] to provide your oncology and hematology care.  To afford each patient quality time with our providers, please arrive at least 15 minutes before your scheduled appointment time.  With your help, our goal is to use those 15 minutes to complete the necessary work-up to ensure our physicians have the information they need to help with your evaluation and healthcare recommendations.    Effective January 1st, 2014, we ask that you re-schedule your appointment with our physicians should you arrive 10 or more minutes late for your appointment.  We strive to give you quality time with our providers, and arriving late affects you and other patients whose appointments are after yours.    Again, thank you for choosing [facility].  Our hope is that these requests will decrease the amount of time that you wait before being seen by our physicians.       _____________________________________________________________  Should you have questions after your visit to [facility], please contact our office at [phone] between the hours of 8:30 a.m. and 5:00 p.m.  Voicemails left after 4:30 p.m. will not be returned until the following business day.  For prescription refill requests, have your pharmacy contact our office with your prescription refill request.

## 2014-01-10 ENCOUNTER — Ambulatory Visit (INDEPENDENT_AMBULATORY_CARE_PROVIDER_SITE_OTHER): Payer: Medicare Other | Admitting: *Deleted

## 2014-01-10 DIAGNOSIS — Z86718 Personal history of other venous thrombosis and embolism: Secondary | ICD-10-CM

## 2014-01-10 DIAGNOSIS — I6350 Cerebral infarction due to unspecified occlusion or stenosis of unspecified cerebral artery: Secondary | ICD-10-CM

## 2014-01-10 DIAGNOSIS — I809 Phlebitis and thrombophlebitis of unspecified site: Secondary | ICD-10-CM

## 2014-01-10 DIAGNOSIS — Z5181 Encounter for therapeutic drug level monitoring: Secondary | ICD-10-CM

## 2014-01-10 DIAGNOSIS — I6340 Cerebral infarction due to embolism of unspecified cerebral artery: Secondary | ICD-10-CM

## 2014-01-10 DIAGNOSIS — I639 Cerebral infarction, unspecified: Secondary | ICD-10-CM

## 2014-01-10 DIAGNOSIS — Z7901 Long term (current) use of anticoagulants: Secondary | ICD-10-CM

## 2014-01-10 LAB — POCT INR: INR: 2.7

## 2014-01-10 MED ORDER — SIMVASTATIN 20 MG PO TABS: 20.0000 mg | ORAL_TABLET | Freq: Every day | ORAL | Status: DC

## 2014-01-10 MED ORDER — WARFARIN SODIUM 5 MG PO TABS: 5.0000 mg | ORAL_TABLET | Freq: Every day | ORAL | Status: DC

## 2014-01-16 ENCOUNTER — Encounter: Payer: Self-pay | Admitting: Cardiology

## 2014-01-16 NOTE — Progress Notes (Signed)
Clinical Summary Mr. Utz is a 68 y.o.male last seen in December 2014. He has been doing well from a cardiac perspective, no interval hospitalizations. He reports NYHA class II dyspnea, no chest pain or palpitations. No reported problems with his current medications.  Lab work from May showed hemoglobin 13.3, platelets 217, potassium 4.4, BUN 20, creatinine 1.1, normal AST and ALT.  Echocardiogram done in June showed mildly dilated left ventricle with wall motion abnormalities, prominent trabeculation at the apex, no definite mural thrombus, LVEF 30%, mild mitral regurgitation, moderate left atrial enlargement, no definite ASD identified, normal RV size. He has preferred medical therapy for treatment of cardiomyopathy. He does has evidence of ischemic heart disease based on prior noninvasive testing.  ECG today reviewed, sinus rhythm with PVCs, previous inferior infarct pattern, nonspecific ST-T changes.   No Known Allergies  Current Outpatient Prescriptions  Medication Sig Dispense Refill  . aspirin 81 MG tablet Take 81 mg by mouth daily.        . carvedilol (COREG) 12.5 MG tablet Take 1 tablet (12.5 mg total) by mouth 2 (two) times daily with a meal.  180 tablet  1  . diclofenac sodium (VOLTAREN) 1 % GEL Apply topically as directed.        . furosemide (LASIX) 20 MG tablet Take 1 tablet (20 mg total) by mouth as needed.  30 tablet  3  . gabapentin (NEURONTIN) 300 MG capsule Take 3 capsules (900 mg total) by mouth daily.  90 capsule  11  . isosorbide mononitrate (IMDUR) 30 MG 24 hr tablet Take 1 tablet (30 mg total) by mouth daily.  90 tablet  1  . lisinopril (PRINIVIL,ZESTRIL) 20 MG tablet Take 1 tablet (20 mg total) by mouth daily.  90 tablet  3  . metFORMIN (GLUCOPHAGE) 500 MG tablet Take 500 mg by mouth 2 (two) times daily with a meal.        . NON FORMULARY legatrin pm Daily        . PROAIR HFA 108 (90 BASE) MCG/ACT inhaler Inhale 2 puffs into the lungs 4 (four) times daily.        . simvastatin (ZOCOR) 20 MG tablet Take 1 tablet (20 mg total) by mouth at bedtime.  90 tablet  3  . warfarin (COUMADIN) 5 MG tablet Take 1 tablet (5 mg total) by mouth daily. 10 mg on Mon and Fri 5 mg all other days  135 tablet  3   No current facility-administered medications for this visit.    Past Medical History  Diagnosis Date  . Essential hypertension, benign   . Type 2 diabetes mellitus   . Mixed hyperlipidemia   . Vitamin D deficiency   . History of colonic polyps   . Erectile dysfunction   . Stroke     Embolic left temporal 7/62 s/p tPA  . Patent foramen ovale   . Cardiomyopathy     LVEF 25%  . 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 Obstructive sleep apnea   . GERD (gastroesophageal reflux disease)   . Phlebitis   . Diabetes mellitus   . Bronchitis 11/2012  . Superficial phlebitis 12/10/2012    Social History Mr. Jeffries reports that he has quit smoking. His smoking use included Cigarettes. He smoked 0.00 packs per day. He has never used smokeless tobacco. Mr. Siefker reports that he does not drink alcohol.  Review of Systems No bleeding problems on Coumadin. Stable appetite. Otherwise as outlined above.  Physical  Examination Filed Vitals:   01/17/14 0821  BP: 122/64  Pulse: 59   Filed Weights   01/17/14 0821  Weight: 184 lb (83.462 kg)    Appears comfortable at rest.  HEENT: Conjuctiva and lids normal, oropharynx with moist mucosa.  Neck: Supple, no elevated JVP or carotid bruits.  Lungs: Clear to auscultation, nonlabored.  Cardiac: Regular rate and rhythm, indistinct PMI, no S3 gallop or significant murmur.  Abdomen: Soft, nontender, bowel sounds present.  Skin: Warm and dry.  Extremities: No pitting edema.    Problem List and Plan   Coronary atherosclerosis of native coronary artery Symptomatically stable on medical therapy. His preference has been to hold off on further invasive evaluations.  Secondary cardiomyopathy, unspecified He continues  to prefer conservative medical therapy for management, LVEF approximately 30%. No changes made today in regimen.  Patent foramen ovale He continues on Coumadin, previous history of stroke. RV size and function normal.  Essential hypertension, benign Blood pressure control is good today.  Mural thrombus of left ventricle Continues on Coumadin, thrombus was not definitively demonstrated by most recent echocardiogram, prominent apical trabeculation seen.    Satira Sark, M.D., F.A.C.C.

## 2014-01-17 ENCOUNTER — Ambulatory Visit (INDEPENDENT_AMBULATORY_CARE_PROVIDER_SITE_OTHER): Payer: Medicare Other | Admitting: Cardiology

## 2014-01-17 ENCOUNTER — Encounter: Payer: Self-pay | Admitting: Cardiology

## 2014-01-17 VITALS — BP 122/64 | HR 59 | Ht 72.0 in | Wt 184.0 lb

## 2014-01-17 DIAGNOSIS — I219 Acute myocardial infarction, unspecified: Secondary | ICD-10-CM

## 2014-01-17 DIAGNOSIS — Q2112 Patent foramen ovale: Secondary | ICD-10-CM

## 2014-01-17 DIAGNOSIS — I2510 Atherosclerotic heart disease of native coronary artery without angina pectoris: Secondary | ICD-10-CM

## 2014-01-17 DIAGNOSIS — Q211 Atrial septal defect: Secondary | ICD-10-CM

## 2014-01-17 DIAGNOSIS — I10 Essential (primary) hypertension: Secondary | ICD-10-CM

## 2014-01-17 DIAGNOSIS — I513 Intracardiac thrombosis, not elsewhere classified: Secondary | ICD-10-CM

## 2014-01-17 DIAGNOSIS — I429 Cardiomyopathy, unspecified: Secondary | ICD-10-CM

## 2014-01-17 DIAGNOSIS — Q2111 Secundum atrial septal defect: Secondary | ICD-10-CM

## 2014-01-17 NOTE — Assessment & Plan Note (Signed)
Blood pressure control is good today. 

## 2014-01-17 NOTE — Assessment & Plan Note (Signed)
He continues on Coumadin, previous history of stroke. RV size and function normal.

## 2014-01-17 NOTE — Addendum Note (Signed)
Addended by: Truett Mainland on: 01/17/2014 11:21 AM   Modules accepted: Orders

## 2014-01-17 NOTE — Assessment & Plan Note (Signed)
Symptomatically stable on medical therapy. His preference has been to hold off on further invasive evaluations.

## 2014-01-17 NOTE — Patient Instructions (Signed)
Your physician wants you to follow-up in: 6 months You will receive a reminder letter in the mail two months in advance. If you don't receive a letter, please call our office to schedule the follow-up appointment.     Your physician recommends that you continue on your current medications as directed. Please refer to the Current Medication list given to you today.      Thank you for choosing [facility] !        

## 2014-01-17 NOTE — Assessment & Plan Note (Signed)
He continues to prefer conservative medical therapy for management, LVEF approximately 30%. No changes made today in regimen.

## 2014-01-17 NOTE — Assessment & Plan Note (Signed)
Continues on Coumadin, thrombus was not definitively demonstrated by most recent echocardiogram, prominent apical trabeculation seen.

## 2014-02-24 ENCOUNTER — Ambulatory Visit (INDEPENDENT_AMBULATORY_CARE_PROVIDER_SITE_OTHER): Payer: Medicare Other | Admitting: *Deleted

## 2014-02-24 DIAGNOSIS — Z7901 Long term (current) use of anticoagulants: Secondary | ICD-10-CM

## 2014-02-24 DIAGNOSIS — Z86718 Personal history of other venous thrombosis and embolism: Secondary | ICD-10-CM

## 2014-02-24 DIAGNOSIS — Z5181 Encounter for therapeutic drug level monitoring: Secondary | ICD-10-CM

## 2014-02-24 DIAGNOSIS — I639 Cerebral infarction, unspecified: Secondary | ICD-10-CM

## 2014-02-24 DIAGNOSIS — I6350 Cerebral infarction due to unspecified occlusion or stenosis of unspecified cerebral artery: Secondary | ICD-10-CM

## 2014-02-24 DIAGNOSIS — I6340 Cerebral infarction due to embolism of unspecified cerebral artery: Secondary | ICD-10-CM

## 2014-02-24 LAB — POCT INR: INR: 2.6

## 2014-03-21 ENCOUNTER — Encounter: Payer: Self-pay | Admitting: Cardiology

## 2014-04-07 ENCOUNTER — Ambulatory Visit (INDEPENDENT_AMBULATORY_CARE_PROVIDER_SITE_OTHER): Payer: Medicare Other | Admitting: Pharmacist

## 2014-04-07 DIAGNOSIS — Z86718 Personal history of other venous thrombosis and embolism: Secondary | ICD-10-CM

## 2014-04-07 DIAGNOSIS — Z7901 Long term (current) use of anticoagulants: Secondary | ICD-10-CM

## 2014-04-07 DIAGNOSIS — Z5181 Encounter for therapeutic drug level monitoring: Secondary | ICD-10-CM

## 2014-04-07 DIAGNOSIS — I6340 Cerebral infarction due to embolism of unspecified cerebral artery: Secondary | ICD-10-CM

## 2014-04-07 DIAGNOSIS — I639 Cerebral infarction, unspecified: Secondary | ICD-10-CM

## 2014-04-07 DIAGNOSIS — I6350 Cerebral infarction due to unspecified occlusion or stenosis of unspecified cerebral artery: Secondary | ICD-10-CM

## 2014-04-07 LAB — POCT INR: INR: 2.6

## 2014-05-18 ENCOUNTER — Ambulatory Visit (INDEPENDENT_AMBULATORY_CARE_PROVIDER_SITE_OTHER): Payer: Medicare Other | Admitting: *Deleted

## 2014-05-18 DIAGNOSIS — Z86718 Personal history of other venous thrombosis and embolism: Secondary | ICD-10-CM

## 2014-05-18 DIAGNOSIS — I6350 Cerebral infarction due to unspecified occlusion or stenosis of unspecified cerebral artery: Secondary | ICD-10-CM

## 2014-05-18 DIAGNOSIS — Z5181 Encounter for therapeutic drug level monitoring: Secondary | ICD-10-CM

## 2014-05-18 DIAGNOSIS — Z7901 Long term (current) use of anticoagulants: Secondary | ICD-10-CM

## 2014-05-18 DIAGNOSIS — I639 Cerebral infarction, unspecified: Secondary | ICD-10-CM

## 2014-05-18 DIAGNOSIS — I6340 Cerebral infarction due to embolism of unspecified cerebral artery: Secondary | ICD-10-CM

## 2014-05-18 LAB — POCT INR: INR: 2.9

## 2014-06-29 ENCOUNTER — Ambulatory Visit (INDEPENDENT_AMBULATORY_CARE_PROVIDER_SITE_OTHER): Payer: Medicare Other | Admitting: *Deleted

## 2014-06-29 DIAGNOSIS — Z5181 Encounter for therapeutic drug level monitoring: Secondary | ICD-10-CM

## 2014-06-29 DIAGNOSIS — Z86718 Personal history of other venous thrombosis and embolism: Secondary | ICD-10-CM

## 2014-06-29 DIAGNOSIS — I6350 Cerebral infarction due to unspecified occlusion or stenosis of unspecified cerebral artery: Secondary | ICD-10-CM

## 2014-06-29 DIAGNOSIS — Z7901 Long term (current) use of anticoagulants: Secondary | ICD-10-CM

## 2014-06-29 DIAGNOSIS — I6340 Cerebral infarction due to embolism of unspecified cerebral artery: Secondary | ICD-10-CM

## 2014-06-29 DIAGNOSIS — I639 Cerebral infarction, unspecified: Secondary | ICD-10-CM

## 2014-06-29 LAB — POCT INR: INR: 2.2

## 2014-08-12 DIAGNOSIS — B351 Tinea unguium: Secondary | ICD-10-CM | POA: Diagnosis not present

## 2014-08-12 DIAGNOSIS — E1142 Type 2 diabetes mellitus with diabetic polyneuropathy: Secondary | ICD-10-CM | POA: Diagnosis not present

## 2014-08-17 ENCOUNTER — Encounter: Payer: Self-pay | Admitting: Cardiology

## 2014-08-17 ENCOUNTER — Ambulatory Visit (INDEPENDENT_AMBULATORY_CARE_PROVIDER_SITE_OTHER): Payer: Medicare Other | Admitting: *Deleted

## 2014-08-17 ENCOUNTER — Ambulatory Visit (INDEPENDENT_AMBULATORY_CARE_PROVIDER_SITE_OTHER): Payer: Medicare Other | Admitting: Cardiology

## 2014-08-17 VITALS — BP 138/98 | HR 59 | Ht 72.0 in | Wt 191.6 lb

## 2014-08-17 DIAGNOSIS — I6340 Cerebral infarction due to embolism of unspecified cerebral artery: Secondary | ICD-10-CM

## 2014-08-17 DIAGNOSIS — I255 Ischemic cardiomyopathy: Secondary | ICD-10-CM

## 2014-08-17 DIAGNOSIS — I10 Essential (primary) hypertension: Secondary | ICD-10-CM | POA: Diagnosis not present

## 2014-08-17 DIAGNOSIS — I639 Cerebral infarction, unspecified: Secondary | ICD-10-CM

## 2014-08-17 DIAGNOSIS — Z5181 Encounter for therapeutic drug level monitoring: Secondary | ICD-10-CM

## 2014-08-17 LAB — POCT INR: INR: 2.9

## 2014-08-17 NOTE — Assessment & Plan Note (Signed)
Likely ischemic based on wall motion abnormalities. Patient prefers conservative approach, continue medical therapy. Fortunately, he has been clinically stable. We will follow-up with an echocardiogram for his next visit in 6 months.

## 2014-08-17 NOTE — Assessment & Plan Note (Signed)
We have continued chronic Coumadin with history of stroke and PFO as well. Last echocardiogram did not show any definitive evidence of residual thrombus but prominent trabeculation.

## 2014-08-17 NOTE — Assessment & Plan Note (Signed)
Blood pressure is elevated today. We reviewed his medications and I discussed his diet and sodium intake. Continue current medications including Coreg and lisinopril. ACE inhibitor could be further advanced if needed. Keep follow-up with Dr. Nevada Crane.

## 2014-08-17 NOTE — Progress Notes (Signed)
Reason for visit: Cardiomyopathy  Clinical Summary Mr. Schrieber is a 69 y.o.male last seen in June 2015. He comes in for a routine visit today. He denies any angina symptoms, has had no palpitations, and reports NYHA class 1-2 dyspnea. He does not exercise, takes care of all of his ADLs and basic chores. We discussed his diet, he states that he mostly prepares his own meals, does not use additional salt. His weight is up about 5 pounds from the last visit.  Echocardiogram done in June 2014 showed mildly dilated left ventricle with wall motion abnormalities, prominent trabeculation at the apex, no definite mural thrombus, LVEF 30%, mild mitral regurgitation, moderate left atrial enlargement, no definite ASD identified, normal RV size. He has preferred medical therapy for treatment of cardiomyopathy. He does have evidence of ischemic heart disease based on prior noninvasive testing.  No Known Allergies  Current Outpatient Prescriptions  Medication Sig Dispense Refill  . aspirin 81 MG tablet Take 81 mg by mouth daily.      . carvedilol (COREG) 12.5 MG tablet Take 1 tablet (12.5 mg total) by mouth 2 (two) times daily with a meal. 180 tablet 1  . furosemide (LASIX) 20 MG tablet Take 1 tablet (20 mg total) by mouth as needed. 30 tablet 3  . gabapentin (NEURONTIN) 300 MG capsule Take 3 capsules (900 mg total) by mouth daily. 90 capsule 11  . lisinopril (PRINIVIL,ZESTRIL) 20 MG tablet Take 1 tablet (20 mg total) by mouth daily. 90 tablet 3  . metFORMIN (GLUCOPHAGE) 500 MG tablet Take 500 mg by mouth 2 (two) times daily with a meal.      . NON FORMULARY legatrin pm Daily      . PROAIR HFA 108 (90 BASE) MCG/ACT inhaler Inhale 2 puffs into the lungs 4 (four) times daily.     . simvastatin (ZOCOR) 20 MG tablet Take 1 tablet (20 mg total) by mouth at bedtime. 90 tablet 3  . warfarin (COUMADIN) 5 MG tablet Take 1 tablet (5 mg total) by mouth daily. 10 mg on Mon and Fri 5 mg all other days 135 tablet 3  .  isosorbide mononitrate (IMDUR) 30 MG 24 hr tablet Take 1 tablet (30 mg total) by mouth daily. (Patient not taking: Reported on 08/17/2014) 90 tablet 1   No current facility-administered medications for this visit.    Past Medical History  Diagnosis Date  . Essential hypertension, benign   . Type 2 diabetes mellitus   . Mixed hyperlipidemia   . Vitamin D deficiency   . History of colonic polyps   . Erectile dysfunction   . Stroke     Embolic left temporal 2/87 s/p tPA  . Patent foramen ovale   . Cardiomyopathy     LVEF 25%  . 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 Obstructive sleep apnea   . GERD (gastroesophageal reflux disease)   . Phlebitis   . Diabetes mellitus   . Bronchitis 11/2012  . Superficial phlebitis 12/10/2012    Social History Mr. Cacciola reports that he quit smoking about 11 years ago. His smoking use included Cigarettes. He started smoking about 47 years ago. He has never used smokeless tobacco. Mr. Woolever reports that he does not drink alcohol.  Review of Systems Complete review of systems negative except as otherwise outlined in the clinical summary and also the following. No sudden palpitations or syncope. No leg edema. No orthopnea or PND.  Physical Examination Filed Vitals:   08/17/14 8676  BP: 138/98  Pulse: 59   Filed Weights   08/17/14 0811  Weight: 191 lb 9.6 oz (86.909 kg)    Appears comfortable at rest.  HEENT: Conjuctiva and lids normal, oropharynx with moist mucosa.  Neck: Supple, no elevated JVP or carotid bruits.  Lungs: Clear to auscultation, nonlabored.  Cardiac: Regular rate and rhythm, indistinct PMI, no S3 gallop or significant murmur.  Abdomen: Soft, nontender, bowel sounds present.  Skin: Warm and dry.  Extremities: No pitting edema.    Problem List and Plan   Secondary cardiomyopathy Likely ischemic based on wall motion abnormalities. Patient prefers conservative approach, continue medical therapy. Fortunately, he has  been clinically stable. We will follow-up with an echocardiogram for his next visit in 6 months.   Mural thrombus of left ventricle We have continued chronic Coumadin with history of stroke and PFO as well. Last echocardiogram did not show any definitive evidence of residual thrombus but prominent trabeculation.   Essential hypertension, benign Blood pressure is elevated today. We reviewed his medications and I discussed his diet and sodium intake. Continue current medications including Coreg and lisinopril. ACE inhibitor could be further advanced if needed. Keep follow-up with Dr. [place] Crane.     Satira Sark, M.D., F.A.C.C.

## 2014-08-17 NOTE — Patient Instructions (Signed)
Your physician wants you to follow-up in: 6 months with Dr. Domenic Polite. You will receive a reminder letter in the mail two months in advance. If you don't receive a letter, please call our office to schedule the follow-up appointment.   Your physician recommends that you continue on your current medications as directed. Please refer to the Current Medication list given to you today.  Your physician has requested that you have an echocardiogram just before your next visit. Echocardiography is a painless test that uses sound waves to create images of your heart. It provides your doctor with information about the size and shape of your heart and how well your heart's chambers and valves are working. This procedure takes approximately one hour. There are no restrictions for this procedure.  Thank you for choosing [facility]!

## 2014-08-18 ENCOUNTER — Other Ambulatory Visit: Payer: Self-pay | Admitting: Cardiology

## 2014-08-18 NOTE — Telephone Encounter (Signed)
Please see refill bin / tgs  °

## 2014-09-28 ENCOUNTER — Ambulatory Visit (INDEPENDENT_AMBULATORY_CARE_PROVIDER_SITE_OTHER): Payer: Medicare Other | Admitting: *Deleted

## 2014-09-28 DIAGNOSIS — Z86718 Personal history of other venous thrombosis and embolism: Secondary | ICD-10-CM

## 2014-09-28 DIAGNOSIS — I639 Cerebral infarction, unspecified: Secondary | ICD-10-CM | POA: Diagnosis not present

## 2014-09-28 DIAGNOSIS — Z7901 Long term (current) use of anticoagulants: Secondary | ICD-10-CM

## 2014-09-28 DIAGNOSIS — I6350 Cerebral infarction due to unspecified occlusion or stenosis of unspecified cerebral artery: Secondary | ICD-10-CM

## 2014-09-28 DIAGNOSIS — I6340 Cerebral infarction due to embolism of unspecified cerebral artery: Secondary | ICD-10-CM | POA: Diagnosis not present

## 2014-09-28 DIAGNOSIS — Z5181 Encounter for therapeutic drug level monitoring: Secondary | ICD-10-CM

## 2014-09-28 LAB — POCT INR: INR: 3.3

## 2014-10-19 ENCOUNTER — Ambulatory Visit (INDEPENDENT_AMBULATORY_CARE_PROVIDER_SITE_OTHER): Payer: Medicare Other | Admitting: *Deleted

## 2014-10-19 DIAGNOSIS — Z5181 Encounter for therapeutic drug level monitoring: Secondary | ICD-10-CM

## 2014-10-19 DIAGNOSIS — I6340 Cerebral infarction due to embolism of unspecified cerebral artery: Secondary | ICD-10-CM

## 2014-10-19 DIAGNOSIS — I6350 Cerebral infarction due to unspecified occlusion or stenosis of unspecified cerebral artery: Secondary | ICD-10-CM

## 2014-10-19 DIAGNOSIS — Z7901 Long term (current) use of anticoagulants: Secondary | ICD-10-CM

## 2014-10-19 DIAGNOSIS — Z86718 Personal history of other venous thrombosis and embolism: Secondary | ICD-10-CM

## 2014-10-19 DIAGNOSIS — I639 Cerebral infarction, unspecified: Secondary | ICD-10-CM

## 2014-10-19 LAB — POCT INR: INR: 2.1

## 2014-10-21 DIAGNOSIS — B351 Tinea unguium: Secondary | ICD-10-CM | POA: Diagnosis not present

## 2014-10-21 DIAGNOSIS — E1142 Type 2 diabetes mellitus with diabetic polyneuropathy: Secondary | ICD-10-CM | POA: Diagnosis not present

## 2014-11-21 ENCOUNTER — Ambulatory Visit (INDEPENDENT_AMBULATORY_CARE_PROVIDER_SITE_OTHER): Payer: Medicare Other | Admitting: *Deleted

## 2014-11-21 DIAGNOSIS — Z5181 Encounter for therapeutic drug level monitoring: Secondary | ICD-10-CM

## 2014-11-21 DIAGNOSIS — Z7901 Long term (current) use of anticoagulants: Secondary | ICD-10-CM

## 2014-11-21 DIAGNOSIS — I6340 Cerebral infarction due to embolism of unspecified cerebral artery: Secondary | ICD-10-CM

## 2014-11-21 DIAGNOSIS — I639 Cerebral infarction, unspecified: Secondary | ICD-10-CM | POA: Diagnosis not present

## 2014-11-21 DIAGNOSIS — Z86718 Personal history of other venous thrombosis and embolism: Secondary | ICD-10-CM

## 2014-11-21 DIAGNOSIS — I6350 Cerebral infarction due to unspecified occlusion or stenosis of unspecified cerebral artery: Secondary | ICD-10-CM

## 2014-11-21 LAB — POCT INR: INR: 1.7

## 2014-12-09 ENCOUNTER — Ambulatory Visit (HOSPITAL_COMMUNITY): Payer: Medicare Other | Admitting: Hematology & Oncology

## 2014-12-09 ENCOUNTER — Ambulatory Visit (HOSPITAL_COMMUNITY): Payer: Medicare Other | Admitting: Oncology

## 2014-12-09 ENCOUNTER — Other Ambulatory Visit (HOSPITAL_COMMUNITY): Payer: Medicare Other

## 2014-12-12 ENCOUNTER — Encounter: Payer: Self-pay | Admitting: *Deleted

## 2014-12-13 ENCOUNTER — Ambulatory Visit (INDEPENDENT_AMBULATORY_CARE_PROVIDER_SITE_OTHER): Payer: Medicare Other | Admitting: *Deleted

## 2014-12-13 DIAGNOSIS — Z7901 Long term (current) use of anticoagulants: Secondary | ICD-10-CM

## 2014-12-13 DIAGNOSIS — I639 Cerebral infarction, unspecified: Secondary | ICD-10-CM | POA: Diagnosis not present

## 2014-12-13 DIAGNOSIS — Z5181 Encounter for therapeutic drug level monitoring: Secondary | ICD-10-CM

## 2014-12-13 DIAGNOSIS — Z86718 Personal history of other venous thrombosis and embolism: Secondary | ICD-10-CM | POA: Diagnosis not present

## 2014-12-13 DIAGNOSIS — I6340 Cerebral infarction due to embolism of unspecified cerebral artery: Secondary | ICD-10-CM

## 2014-12-13 DIAGNOSIS — I6350 Cerebral infarction due to unspecified occlusion or stenosis of unspecified cerebral artery: Secondary | ICD-10-CM

## 2014-12-13 LAB — POCT INR: INR: 2.6

## 2014-12-19 ENCOUNTER — Encounter (HOSPITAL_COMMUNITY): Payer: Medicare Other | Attending: Hematology & Oncology | Admitting: Hematology & Oncology

## 2014-12-19 ENCOUNTER — Encounter (HOSPITAL_COMMUNITY): Payer: Self-pay | Admitting: Hematology & Oncology

## 2014-12-19 ENCOUNTER — Encounter (HOSPITAL_BASED_OUTPATIENT_CLINIC_OR_DEPARTMENT_OTHER): Payer: Medicare Other

## 2014-12-19 VITALS — BP 143/95 | HR 59 | Temp 98.5°F | Resp 16 | Wt 192.0 lb

## 2014-12-19 DIAGNOSIS — I809 Phlebitis and thrombophlebitis of unspecified site: Secondary | ICD-10-CM

## 2014-12-19 DIAGNOSIS — I10 Essential (primary) hypertension: Secondary | ICD-10-CM | POA: Diagnosis not present

## 2014-12-19 DIAGNOSIS — Z87891 Personal history of nicotine dependence: Secondary | ICD-10-CM | POA: Insufficient documentation

## 2014-12-19 DIAGNOSIS — Z7901 Long term (current) use of anticoagulants: Secondary | ICD-10-CM | POA: Diagnosis not present

## 2014-12-19 DIAGNOSIS — E119 Type 2 diabetes mellitus without complications: Secondary | ICD-10-CM | POA: Diagnosis not present

## 2014-12-19 DIAGNOSIS — E780 Pure hypercholesterolemia: Secondary | ICD-10-CM | POA: Insufficient documentation

## 2014-12-19 DIAGNOSIS — E1149 Type 2 diabetes mellitus with other diabetic neurological complication: Secondary | ICD-10-CM

## 2014-12-19 DIAGNOSIS — I2510 Atherosclerotic heart disease of native coronary artery without angina pectoris: Secondary | ICD-10-CM | POA: Insufficient documentation

## 2014-12-19 DIAGNOSIS — I8001 Phlebitis and thrombophlebitis of superficial vessels of right lower extremity: Secondary | ICD-10-CM

## 2014-12-19 DIAGNOSIS — G4739 Other sleep apnea: Secondary | ICD-10-CM | POA: Insufficient documentation

## 2014-12-19 DIAGNOSIS — Z8673 Personal history of transient ischemic attack (TIA), and cerebral infarction without residual deficits: Secondary | ICD-10-CM | POA: Insufficient documentation

## 2014-12-19 LAB — CBC WITH DIFFERENTIAL/PLATELET
Basophils Absolute: 0 10*3/uL (ref 0.0–0.1)
Basophils Relative: 1 % (ref 0–1)
Eosinophils Absolute: 0.4 10*3/uL (ref 0.0–0.7)
Eosinophils Relative: 5 % (ref 0–5)
HEMATOCRIT: 42.6 % (ref 39.0–52.0)
HEMOGLOBIN: 13.3 g/dL (ref 13.0–17.0)
LYMPHS ABS: 2.1 10*3/uL (ref 0.7–4.0)
LYMPHS PCT: 28 % (ref 12–46)
MCH: 28.1 pg (ref 26.0–34.0)
MCHC: 31.2 g/dL (ref 30.0–36.0)
MCV: 89.9 fL (ref 78.0–100.0)
MONO ABS: 0.4 10*3/uL (ref 0.1–1.0)
MONOS PCT: 6 % (ref 3–12)
NEUTROS ABS: 4.4 10*3/uL (ref 1.7–7.7)
NEUTROS PCT: 60 % (ref 43–77)
Platelets: 193 10*3/uL (ref 150–400)
RBC: 4.74 MIL/uL (ref 4.22–5.81)
RDW: 12.6 % (ref 11.5–15.5)
WBC: 7.3 10*3/uL (ref 4.0–10.5)

## 2014-12-19 LAB — COMPREHENSIVE METABOLIC PANEL
ALK PHOS: 51 U/L (ref 38–126)
ALT: 18 U/L (ref 17–63)
AST: 20 U/L (ref 15–41)
Albumin: 4.2 g/dL (ref 3.5–5.0)
Anion gap: 7 (ref 5–15)
BILIRUBIN TOTAL: 0.4 mg/dL (ref 0.3–1.2)
BUN: 13 mg/dL (ref 6–20)
CALCIUM: 9.2 mg/dL (ref 8.9–10.3)
CHLORIDE: 106 mmol/L (ref 101–111)
CO2: 26 mmol/L (ref 22–32)
Creatinine, Ser: 1.11 mg/dL (ref 0.61–1.24)
GLUCOSE: 116 mg/dL — AB (ref 65–99)
Potassium: 4.6 mmol/L (ref 3.5–5.1)
SODIUM: 139 mmol/L (ref 135–145)
Total Protein: 7.5 g/dL (ref 6.5–8.1)

## 2014-12-19 LAB — D-DIMER, QUANTITATIVE (NOT AT ARMC): D DIMER QUANT: 0.3 ug{FEU}/mL (ref 0.00–0.48)

## 2014-12-19 NOTE — Progress Notes (Signed)
Dennis Cahill, MD [address] [place] [address]   Superficial Phlebitis of the right leg History of CVA  CURRENT THERAPY: Observation   INTERVAL HISTORY: Dennis Yu 69 y.o. male returns for  regular  visit for followup of superficial phlebitis of his right leg presenting here for the first time on 11/06/2007. He had a 1 time extension from the upper thigh in the past all the way down to his lower leg. However, he is primarily had only the lower leg involvement. He also had venous stasis ulcers in the past but none recently. He is utilizing pressure stocking.  I personally reviewed and went over laboratory results with the patient.  The results are noted within this dictation.  His labs are very stable.  The patient is having his INR's monitored by his cardiologist.  He is still on Coumadin. After the stroke he has had SOB and balance problems. He has been through therapy after the stroke and has a neurologist he follows with here in [place].  He hasn't had any more problems with blood clots in his legs or arms. He hasn't experienced any problems with bleeding.   He is up to date with his colonoscopy.  His primary doctor is Dr. Delphina Yu.  Past Medical History  Diagnosis Date  . Essential hypertension, benign   . Type 2 diabetes mellitus   . Mixed hyperlipidemia   . Vitamin D deficiency   . History of colonic polyps   . Erectile dysfunction   . 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 Patent foramen ovale   . Cardiomyopathy     LVEF 25%  . 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 Obstructive sleep apnea   . GERD (gastroesophageal reflux disease)   . Phlebitis   . Diabetes mellitus   . Bronchitis 11/2012  . Superficial phlebitis 12/10/2012    has DIABETES MELLITUS, TYPE II, CONTROLLED, W/NEURO COMPS; Mixed hyperlipidemia; Essential hypertension, benign; Long term (current) use of anticoagulants; Embolic stroke; Patent foramen ovale; Coronary  atherosclerosis of native coronary artery; Mural thrombus of left ventricle; Secondary cardiomyopathy; Superficial phlebitis; and Encounter for therapeutic drug monitoring on his problem list.     has No Known Allergies.  [name] had no medications administered during this visit.  No past surgical history on file.   He lives with his wife and has been married 4-5 years.  He has children, 3 from his last wife, 3 from his first wife.  He has 9 grandchildren.  He has 6 brothers and 6 sisters, all with the same mother and father.   Denies any headaches, dizziness, double vision, fevers, chills, night sweats, nausea, vomiting, diarrhea, constipation, chest pain, heart palpitations, blood in stool, black tarry stool, urinary pain, urinary burning, urinary frequency, hematuria. Positive for balance problems since stroke. Positive for Shortness of Breath since stroke. Positive for focal weakness to the left side.  PHYSICAL EXAMINATION  ECOG PERFORMANCE STATUS: 1 - Symptomatic but completely ambulatory  There were no vitals filed for this visit.  GENERAL:alert, no distress, well nourished, well developed, comfortable, cooperative and smiling     Visibly wobbly when getting up. SKIN: skin color, texture, turgor are normal, no rashes or significant lesions HEAD: Normocephalic, No masses, lesions, tenderness or abnormalities EYES: normal, PERRLA, EOMI, Conjunctiva are pink and non-injected EARS: External ears normal OROPHARYNX:mucous membranes are moist  NECK: supple, no adenopathy, trachea midline LYMPH:  no palpable lymphadenopathy  BREAST:not examined LUNGS: clear to auscultation and percussion HEART: regular rate & rhythm, no murmurs and no gallops ABDOMEN:abdomen soft and normal bowel sounds BACK: Back symmetric, no curvature. EXTREMITIES:less then 2 second capillary refill, no joint deformities, effusion, or inflammation, no skin discoloration, no clubbing, no cyanosis       Complains of focal weakness to the left side. NEURO: alert & oriented x 3 with fluent speech, imbalance on standing but then little difficulty with ambulation   LABORATORY DATA: CBC    Component Value Date/Time   WBC 6.2 12/06/2013 0819   RBC 4.70 12/06/2013 0819   HGB 13.3 12/06/2013 0819   HCT 41.4 12/06/2013 0819   PLT 217 12/06/2013 0819   MCV 88.1 12/06/2013 0819   MCH 28.3 12/06/2013 0819   MCHC 32.1 12/06/2013 0819   RDW 12.9 12/06/2013 0819   LYMPHSABS 2.1 12/06/2013 0819   MONOABS 0.4 12/06/2013 0819   EOSABS 0.2 12/06/2013 0819   BASOSABS 0.0 12/06/2013 0819      Chemistry      Component Value Date/Time   NA 140 12/06/2013 0819   K 4.4 12/06/2013 0819   CL 104 12/06/2013 0819   CO2 24 12/06/2013 0819   BUN 20 12/06/2013 0819   CREATININE 1.13 12/06/2013 0819   CREATININE 1.04 02/10/2013 0825      Component Value Date/Time   CALCIUM 9.3 12/06/2013 0819   ALKPHOS 52 12/06/2013 0819   AST 22 12/06/2013 0819   ALT 14 12/06/2013 0819   BILITOT 0.3 12/06/2013 0819        ASSESSMENT:  1. Superficial phlebitis of his right leg presenting here for the first time on 11/06/2007. He had a 1 time extension from the upper thigh in the past all the way down to his lower leg. However, he is primarily had only the lower leg involvement. He also had venous stasis ulcers in the past but none recently.  2. Stroke affecting left-sided body in August 2012  3. History of smoking times many years, quit  4. Hypercholesterolemia  5. Sleep apnea syndrome.  6. 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 Diabetes mellitus times many years  8. HTN, followed by cardiology  9. CAD, followed by cardiology  10. Patent foramen ovale, followed by cardiology  11. Mural thrombus of left ventricle, followed by cardiology    Patient Active Problem List   Diagnosis Date Noted  . Encounter for therapeutic drug monitoring 09/02/2013  . Superficial  phlebitis 12/10/2012  . Secondary cardiomyopathy 09/03/2011  . Long term (current) use of anticoagulants 05/27/2011  . Embolic stroke 16/05/9603  . Patent foramen ovale 05/27/2011  . Coronary atherosclerosis of native coronary artery 05/27/2011  . Mural thrombus of left ventricle 05/27/2011  . DIABETES MELLITUS, TYPE II, CONTROLLED, W/NEURO COMPS 07/01/2006  . Mixed hyperlipidemia 07/01/2006  . Essential hypertension, benign 07/01/2006     PLAN:  1. I personally reviewed and went over laboratory results with the patient.  The results are noted within this dictation. 2. Recommend follow-up with PCP as directed 3. Follow-up with cardiology as directed 4. Patient wishes to continue annual follow-up.  We will see him in 1 year.    All questions were answered. The patient knows to call the clinic with any problems, questions or concerns. We can certainly see the patient much sooner if necessary.  This document serves as a record of services personally performed by Ancil Linsey, MD. It was created on  her behalf by Arlyce Harman, a trained medical scribe. The creation of this record is based on the scribe's personal observations and the provider's statements to them. This document has been checked and approved by the attending provider.  I have reviewed the above documentation for accuracy and completeness, and I agree with the above. Donald Pore MD

## 2014-12-19 NOTE — Progress Notes (Signed)
LABS DRAWN

## 2014-12-19 NOTE — Patient Instructions (Signed)
[place] at [facility] Discharge Instructions  RECOMMENDATIONS MADE BY THE CONSULTANT AND ANY TEST RESULTS WILL BE SENT TO YOUR REFERRING PHYSICIAN.  Exam and discussion by Dr. Whitney Muse. Call with any concerns or issues  Follow-up in 1 year with labs and office visit.  Thank you for choosing [place] at [facility] to provide your oncology and hematology care.  To afford each patient quality time with our provider, please arrive at least 15 minutes before your scheduled appointment time.    You need to re-schedule your appointment should you arrive 10 or more minutes late.  We strive to give you quality time with our providers, and arriving late affects you and other patients whose appointments are after yours.  Also, if you no show three or more times for appointments you may be dismissed from the clinic at the providers discretion.     Again, thank you for choosing [facility].  Our hope is that these requests will decrease the amount of time that you wait before being seen by our physicians.       _____________________________________________________________  Should you have questions after your visit to [facility], please contact our office at [phone] between the hours of 8:30 a.m. and 4:30 p.m.  Voicemails left after 4:30 p.m. will not be returned until the following business day.  For prescription refill requests, have your pharmacy contact our office.

## 2014-12-28 DIAGNOSIS — E785 Hyperlipidemia, unspecified: Secondary | ICD-10-CM | POA: Diagnosis not present

## 2014-12-28 DIAGNOSIS — E119 Type 2 diabetes mellitus without complications: Secondary | ICD-10-CM | POA: Diagnosis not present

## 2014-12-30 DIAGNOSIS — E785 Hyperlipidemia, unspecified: Secondary | ICD-10-CM | POA: Diagnosis not present

## 2014-12-30 DIAGNOSIS — E119 Type 2 diabetes mellitus without complications: Secondary | ICD-10-CM | POA: Diagnosis not present

## 2014-12-30 DIAGNOSIS — I4891 Unspecified atrial fibrillation: Secondary | ICD-10-CM | POA: Diagnosis not present

## 2014-12-30 DIAGNOSIS — I10 Essential (primary) hypertension: Secondary | ICD-10-CM | POA: Diagnosis not present

## 2015-01-06 DIAGNOSIS — B351 Tinea unguium: Secondary | ICD-10-CM | POA: Diagnosis not present

## 2015-01-06 DIAGNOSIS — E1142 Type 2 diabetes mellitus with diabetic polyneuropathy: Secondary | ICD-10-CM | POA: Diagnosis not present

## 2015-01-11 ENCOUNTER — Ambulatory Visit (INDEPENDENT_AMBULATORY_CARE_PROVIDER_SITE_OTHER): Payer: Medicare Other | Admitting: *Deleted

## 2015-01-11 DIAGNOSIS — Z5181 Encounter for therapeutic drug level monitoring: Secondary | ICD-10-CM | POA: Diagnosis not present

## 2015-01-11 DIAGNOSIS — I639 Cerebral infarction, unspecified: Secondary | ICD-10-CM

## 2015-01-11 DIAGNOSIS — Z86718 Personal history of other venous thrombosis and embolism: Secondary | ICD-10-CM

## 2015-01-11 DIAGNOSIS — I6350 Cerebral infarction due to unspecified occlusion or stenosis of unspecified cerebral artery: Secondary | ICD-10-CM

## 2015-01-11 DIAGNOSIS — I6340 Cerebral infarction due to embolism of unspecified cerebral artery: Secondary | ICD-10-CM

## 2015-01-11 DIAGNOSIS — Z7901 Long term (current) use of anticoagulants: Secondary | ICD-10-CM

## 2015-01-11 LAB — POCT INR: INR: 2.7

## 2015-02-08 ENCOUNTER — Ambulatory Visit: Payer: Medicare Other | Admitting: Cardiology

## 2015-02-10 ENCOUNTER — Encounter: Payer: Self-pay | Admitting: Cardiology

## 2015-02-10 ENCOUNTER — Ambulatory Visit (INDEPENDENT_AMBULATORY_CARE_PROVIDER_SITE_OTHER): Payer: Medicare Other | Admitting: Cardiology

## 2015-02-10 VITALS — BP 132/90 | HR 75 | Ht 72.0 in | Wt 191.0 lb

## 2015-02-10 DIAGNOSIS — I213 ST elevation (STEMI) myocardial infarction of unspecified site: Secondary | ICD-10-CM | POA: Diagnosis not present

## 2015-02-10 DIAGNOSIS — Z7901 Long term (current) use of anticoagulants: Secondary | ICD-10-CM

## 2015-02-10 DIAGNOSIS — I255 Ischemic cardiomyopathy: Secondary | ICD-10-CM | POA: Diagnosis not present

## 2015-02-10 DIAGNOSIS — I639 Cerebral infarction, unspecified: Secondary | ICD-10-CM | POA: Diagnosis not present

## 2015-02-10 DIAGNOSIS — E782 Mixed hyperlipidemia: Secondary | ICD-10-CM

## 2015-02-10 DIAGNOSIS — I10 Essential (primary) hypertension: Secondary | ICD-10-CM

## 2015-02-10 DIAGNOSIS — I513 Intracardiac thrombosis, not elsewhere classified: Secondary | ICD-10-CM

## 2015-02-10 NOTE — Progress Notes (Signed)
Cardiology Office Note  Date: 02/10/2015   ID: Dennis Yu, DOB 10/05/1945, MRN [id number]  PCP: Delphina Cahill, MD  Primary Cardiologist: Rozann Lesches, MD   Chief Complaint  Patient presents with  . Cardiomyopathy  . Hypertension    History of Present Illness: Dennis Yu is a 69 y.o. male last seen in January. He presents for a routine follow-up visit. Reports chronic dyspnea on exertion, no significant changes. No angina symptoms or palpitations. He reports compliance with his medications which we reviewed below.  He continues on Coumadin, followed in the anticoagulation clinic. No reported bleeding problems.  Last echocardiogram from 2014 is outlined below, we have discussed obtaining a follow-up study. Otherwise, he has preferred a conservative approach to his cardiac management. We have discussed cardiac catheterization and ICD options, however he has not wanted to pursue invasive assessment.  ECG today is stable compared to prior tracing showing evidence of inferior and anterior infarcts with residual ST-T wave abnormalities.   Past Medical History  Diagnosis Date  . Essential hypertension, benign   . Type 2 diabetes mellitus   . Mixed hyperlipidemia   . Vitamin D deficiency   . History of colonic polyps   . Erectile dysfunction   . Stroke     Embolic left temporal 5/70 s/p tPA  . Patent foramen ovale   . Cardiomyopathy     LVEF 25%  . 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 Obstructive sleep apnea   . GERD (gastroesophageal reflux disease)   . Phlebitis   . Diabetes mellitus   . Bronchitis 11/2012  . Superficial phlebitis 12/10/2012    History reviewed. No pertinent past surgical history.  Current Outpatient Prescriptions  Medication Sig Dispense Refill  . aspirin 81 MG tablet Take 81 mg by mouth daily.      . carvedilol (COREG) 12.5 MG tablet Take 1 tablet (12.5 mg total) by mouth 2 (two) times daily with a meal. 180 tablet 1  . furosemide (LASIX)  20 MG tablet Take 20 mg by mouth every other day.     . gabapentin (NEURONTIN) 300 MG capsule Take 3 capsules (900 mg total) by mouth daily. 90 capsule 11  . isosorbide mononitrate (IMDUR) 30 MG 24 hr tablet Take 1 tablet (30 mg total) by mouth daily. 90 tablet 1  . lisinopril (PRINIVIL,ZESTRIL) 20 MG tablet Take 1 tablet (20 mg total) by mouth daily. 90 tablet 3  . metFORMIN (GLUCOPHAGE) 500 MG tablet Take 500 mg by mouth 2 (two) times daily with a meal.      . NON FORMULARY legatrin pm Daily      . Omega-3 Fatty Acids (FISH OIL) 1000 MG CAPS Take 1,000 mg by mouth 2 (two) times daily.    [name] PROAIR HFA 108 (90 BASE) MCG/ACT inhaler Inhale 2 puffs into the lungs 4 (four) times daily.     . simvastatin (ZOCOR) 20 MG tablet Take 1 tablet (20 mg total) by mouth at bedtime. 90 tablet 3  . warfarin (COUMADIN) 5 MG tablet Take 1 tablet (5 mg total) by mouth daily. 10 mg on Mon and Fri 5 mg all other days 135 tablet 3   No current facility-administered medications for this visit.    Allergies:  Review of patient's allergies indicates no known allergies.   Social History: The patient  reports that he quit smoking about 11 years ago. His smoking use included Cigarettes. He started smoking about 47 years ago. He has never  used smokeless tobacco. He reports that he does not drink alcohol or use illicit drugs.   ROS:  Please see the history of present illness. Otherwise, complete review of systems is positive for leg weakness and arthritic pains.  All other systems are reviewed and negative.   Physical Exam: VS:  BP 132/90 mmHg  Pulse 75  Ht 6' (1.829 m)  Wt 191 lb (86.637 kg)  BMI 25.90 kg/m2  SpO2 93%, BMI Body mass index is 25.9 kg/(m^2).  Wt Readings from Last 3 Encounters:  02/10/15 191 lb (86.637 kg)  12/19/14 192 lb (87.091 kg)  08/17/14 191 lb 9.6 oz (86.909 kg)     Appears comfortable at rest.  HEENT: Conjuctiva and lids normal, oropharynx clear.  Neck: Supple, no elevated JVP or  carotid bruits.  Lungs: Clear to auscultation, nonlabored.  Cardiac: Regular rate and rhythm, indistinct PMI, no S3 gallop or significant murmur.  Abdomen: Soft, nontender, bowel sounds present.  Skin: Warm and dry.  Extremities: No pitting edema.  Musculoskeletal: No kyphosis. Neuropsychiatric: Alert and oriented 3, affect appropriate.   ECG: ECG is ordered today and reviewed showing sinus rhythm with evidence of previous inferior and anterior infarcts, residual anterior ST elevation with T-wave abnormalities suggestive of aneurysmal change.  Recent Labwork: 12/19/2014: ALT 18; AST 20; BUN 13; Creatinine, Ser 1.11; Hemoglobin 13.3; Platelets 193; Potassium 4.6; Sodium 139  03/15/2014: Cholesterol 112, triglycerides 153, HDL 28, LDL 53  Other Studies Reviewed Today:  Echocardiogram done in June 2014 showed mildly dilated left ventricle with wall motion abnormalities, prominent trabeculation at the apex, no definite mural thrombus, LVEF 30%, mild mitral regurgitation, moderate left atrial enlargement, no definite ASD identified, normal RV size.   ASSESSMENT AND PLAN:  1. Ischemic cardiomyopathy based on previous noninvasive workup. LVEF approximately 30% as of 2014. He continues to prefer conservative management as discussed above, has been stable on medical therapy.  2. History of LV mural thrombus. Last echocardiogram showed prominent LV apical trabeculation without definite thrombus, he has been on chronic Coumadin which will continue.  3. Previous history of stroke with reported PFO, not described by last echocardiogram however. We are continuing chronic Coumadin in this case as well.  4. Essential hypertension, continue current medical regimen, sodium restriction, follow-up with Dr. Nevada Crane.  5. Hyperlipidemia, on statin therapy, LDL 53.  Current medicines were reviewed at length with the patient today.   Orders Placed This Encounter  Procedures  . EKG 12-Lead  .  Echocardiogram    Disposition: FU with me in 6 months.   Signed, Satira Sark, MD, [facility] 02/10/2015 8:34 AM    [facility] at [facility] [address], [place], [place] [address] Phone: [phone]; Fax: [phone]

## 2015-02-10 NOTE — Patient Instructions (Signed)
Your physician wants you to follow-up in: 6 months with Dr McDowell You will receive a reminder letter in the mail two months in advance. If you don't receive a letter, please call our office to schedule the follow-up appointment.    Your physician recommends that you continue on your current medications as directed. Please refer to the Current Medication list given to you today.    Your physician has requested that you have an echocardiogram. Echocardiography is a painless test that uses sound waves to create images of your heart. It provides your doctor with information about the size and shape of your heart and how well your heart's chambers and valves are working. This procedure takes approximately one hour. There are no restrictions for this procedure.     Thank you for choosing [facility] !        

## 2015-02-15 ENCOUNTER — Ambulatory Visit (HOSPITAL_COMMUNITY)
Admission: RE | Admit: 2015-02-15 | Discharge: 2015-02-15 | Disposition: A | Discharge status: Home / Self Care | Payer: Medicare Other | Source: Ambulatory Visit | Attending: Cardiology | Admitting: Cardiology

## 2015-02-15 ENCOUNTER — Ambulatory Visit (INDEPENDENT_AMBULATORY_CARE_PROVIDER_SITE_OTHER): Payer: Medicare Other | Admitting: *Deleted

## 2015-02-15 DIAGNOSIS — I6350 Cerebral infarction due to unspecified occlusion or stenosis of unspecified cerebral artery: Secondary | ICD-10-CM

## 2015-02-15 DIAGNOSIS — Z5181 Encounter for therapeutic drug level monitoring: Secondary | ICD-10-CM

## 2015-02-15 DIAGNOSIS — Z7901 Long term (current) use of anticoagulants: Secondary | ICD-10-CM | POA: Diagnosis not present

## 2015-02-15 DIAGNOSIS — I6340 Cerebral infarction due to embolism of unspecified cerebral artery: Secondary | ICD-10-CM | POA: Diagnosis not present

## 2015-02-15 DIAGNOSIS — I639 Cerebral infarction, unspecified: Secondary | ICD-10-CM

## 2015-02-15 DIAGNOSIS — I429 Cardiomyopathy, unspecified: Secondary | ICD-10-CM | POA: Insufficient documentation

## 2015-02-15 DIAGNOSIS — I081 Rheumatic disorders of both mitral and tricuspid valves: Secondary | ICD-10-CM | POA: Insufficient documentation

## 2015-02-15 DIAGNOSIS — I255 Ischemic cardiomyopathy: Secondary | ICD-10-CM

## 2015-02-15 DIAGNOSIS — Z86718 Personal history of other venous thrombosis and embolism: Secondary | ICD-10-CM | POA: Diagnosis not present

## 2015-02-15 LAB — POCT INR: INR: 3

## 2015-03-29 ENCOUNTER — Ambulatory Visit (INDEPENDENT_AMBULATORY_CARE_PROVIDER_SITE_OTHER): Payer: Medicare Other | Admitting: *Deleted

## 2015-03-29 DIAGNOSIS — Z86718 Personal history of other venous thrombosis and embolism: Secondary | ICD-10-CM | POA: Diagnosis not present

## 2015-03-29 DIAGNOSIS — I639 Cerebral infarction, unspecified: Secondary | ICD-10-CM

## 2015-03-29 DIAGNOSIS — Z7901 Long term (current) use of anticoagulants: Secondary | ICD-10-CM | POA: Diagnosis not present

## 2015-03-29 DIAGNOSIS — Z5181 Encounter for therapeutic drug level monitoring: Secondary | ICD-10-CM | POA: Diagnosis not present

## 2015-03-29 DIAGNOSIS — I6340 Cerebral infarction due to embolism of unspecified cerebral artery: Secondary | ICD-10-CM

## 2015-03-29 DIAGNOSIS — I6350 Cerebral infarction due to unspecified occlusion or stenosis of unspecified cerebral artery: Secondary | ICD-10-CM

## 2015-03-29 LAB — POCT INR: INR: 2.7

## 2015-04-14 DIAGNOSIS — E1142 Type 2 diabetes mellitus with diabetic polyneuropathy: Secondary | ICD-10-CM | POA: Diagnosis not present

## 2015-04-14 DIAGNOSIS — B351 Tinea unguium: Secondary | ICD-10-CM | POA: Diagnosis not present

## 2015-05-10 ENCOUNTER — Ambulatory Visit (INDEPENDENT_AMBULATORY_CARE_PROVIDER_SITE_OTHER): Payer: Medicare Other | Admitting: *Deleted

## 2015-05-10 DIAGNOSIS — I6350 Cerebral infarction due to unspecified occlusion or stenosis of unspecified cerebral artery: Secondary | ICD-10-CM | POA: Diagnosis not present

## 2015-05-10 DIAGNOSIS — Z7901 Long term (current) use of anticoagulants: Secondary | ICD-10-CM

## 2015-05-10 DIAGNOSIS — I639 Cerebral infarction, unspecified: Secondary | ICD-10-CM | POA: Diagnosis not present

## 2015-05-10 DIAGNOSIS — Z86718 Personal history of other venous thrombosis and embolism: Secondary | ICD-10-CM

## 2015-05-10 DIAGNOSIS — Z5181 Encounter for therapeutic drug level monitoring: Secondary | ICD-10-CM | POA: Diagnosis not present

## 2015-05-10 LAB — POCT INR: INR: 2.6

## 2015-05-23 DIAGNOSIS — E119 Type 2 diabetes mellitus without complications: Secondary | ICD-10-CM | POA: Diagnosis not present

## 2015-05-23 DIAGNOSIS — Z125 Encounter for screening for malignant neoplasm of prostate: Secondary | ICD-10-CM | POA: Diagnosis not present

## 2015-05-23 DIAGNOSIS — I10 Essential (primary) hypertension: Secondary | ICD-10-CM | POA: Diagnosis not present

## 2015-05-25 DIAGNOSIS — E785 Hyperlipidemia, unspecified: Secondary | ICD-10-CM | POA: Diagnosis not present

## 2015-05-25 DIAGNOSIS — I10 Essential (primary) hypertension: Secondary | ICD-10-CM | POA: Diagnosis not present

## 2015-05-25 DIAGNOSIS — E119 Type 2 diabetes mellitus without complications: Secondary | ICD-10-CM | POA: Diagnosis not present

## 2015-05-25 DIAGNOSIS — I482 Chronic atrial fibrillation: Secondary | ICD-10-CM | POA: Diagnosis not present

## 2015-06-21 ENCOUNTER — Ambulatory Visit (INDEPENDENT_AMBULATORY_CARE_PROVIDER_SITE_OTHER): Payer: Medicare Other | Admitting: *Deleted

## 2015-06-21 DIAGNOSIS — Z86718 Personal history of other venous thrombosis and embolism: Secondary | ICD-10-CM | POA: Diagnosis not present

## 2015-06-21 DIAGNOSIS — I639 Cerebral infarction, unspecified: Secondary | ICD-10-CM | POA: Diagnosis not present

## 2015-06-21 DIAGNOSIS — I6350 Cerebral infarction due to unspecified occlusion or stenosis of unspecified cerebral artery: Secondary | ICD-10-CM

## 2015-06-21 DIAGNOSIS — Z7901 Long term (current) use of anticoagulants: Secondary | ICD-10-CM

## 2015-06-21 DIAGNOSIS — Z5181 Encounter for therapeutic drug level monitoring: Secondary | ICD-10-CM | POA: Diagnosis not present

## 2015-06-21 LAB — POCT INR: INR: 2.3

## 2015-07-04 DIAGNOSIS — B351 Tinea unguium: Secondary | ICD-10-CM | POA: Diagnosis not present

## 2015-07-04 DIAGNOSIS — E1142 Type 2 diabetes mellitus with diabetic polyneuropathy: Secondary | ICD-10-CM | POA: Diagnosis not present

## 2015-08-02 ENCOUNTER — Ambulatory Visit (INDEPENDENT_AMBULATORY_CARE_PROVIDER_SITE_OTHER): Payer: Medicare Other | Admitting: *Deleted

## 2015-08-02 DIAGNOSIS — Z5181 Encounter for therapeutic drug level monitoring: Secondary | ICD-10-CM

## 2015-08-02 DIAGNOSIS — I639 Cerebral infarction, unspecified: Secondary | ICD-10-CM

## 2015-08-02 DIAGNOSIS — Z86718 Personal history of other venous thrombosis and embolism: Secondary | ICD-10-CM | POA: Diagnosis not present

## 2015-08-02 DIAGNOSIS — I6350 Cerebral infarction due to unspecified occlusion or stenosis of unspecified cerebral artery: Secondary | ICD-10-CM | POA: Diagnosis not present

## 2015-08-02 DIAGNOSIS — Z7901 Long term (current) use of anticoagulants: Secondary | ICD-10-CM | POA: Diagnosis not present

## 2015-08-02 LAB — POCT INR: INR: 2.6

## 2015-08-14 ENCOUNTER — Other Ambulatory Visit: Payer: Self-pay | Admitting: Cardiology

## 2015-09-13 ENCOUNTER — Ambulatory Visit (INDEPENDENT_AMBULATORY_CARE_PROVIDER_SITE_OTHER): Payer: Medicare Other | Admitting: *Deleted

## 2015-09-13 DIAGNOSIS — Z5181 Encounter for therapeutic drug level monitoring: Secondary | ICD-10-CM | POA: Diagnosis not present

## 2015-09-13 DIAGNOSIS — Z7901 Long term (current) use of anticoagulants: Secondary | ICD-10-CM | POA: Diagnosis not present

## 2015-09-13 DIAGNOSIS — Z86718 Personal history of other venous thrombosis and embolism: Secondary | ICD-10-CM

## 2015-09-13 DIAGNOSIS — I6350 Cerebral infarction due to unspecified occlusion or stenosis of unspecified cerebral artery: Secondary | ICD-10-CM

## 2015-09-13 DIAGNOSIS — I639 Cerebral infarction, unspecified: Secondary | ICD-10-CM | POA: Diagnosis not present

## 2015-09-13 LAB — POCT INR: INR: 1.8

## 2015-09-13 MED ORDER — LISINOPRIL 20 MG PO TABS: 20.0000 mg | ORAL_TABLET | Freq: Every day | ORAL | Status: DC

## 2015-09-19 DIAGNOSIS — B351 Tinea unguium: Secondary | ICD-10-CM | POA: Diagnosis not present

## 2015-09-19 DIAGNOSIS — E1142 Type 2 diabetes mellitus with diabetic polyneuropathy: Secondary | ICD-10-CM | POA: Diagnosis not present

## 2015-09-22 ENCOUNTER — Ambulatory Visit (INDEPENDENT_AMBULATORY_CARE_PROVIDER_SITE_OTHER): Payer: Medicare Other | Admitting: Cardiology

## 2015-09-22 ENCOUNTER — Encounter: Payer: Self-pay | Admitting: Cardiology

## 2015-09-22 VITALS — BP 116/68 | HR 67 | Ht 72.0 in | Wt 192.0 lb

## 2015-09-22 DIAGNOSIS — I6350 Cerebral infarction due to unspecified occlusion or stenosis of unspecified cerebral artery: Secondary | ICD-10-CM

## 2015-09-22 DIAGNOSIS — E782 Mixed hyperlipidemia: Secondary | ICD-10-CM

## 2015-09-22 DIAGNOSIS — I2510 Atherosclerotic heart disease of native coronary artery without angina pectoris: Secondary | ICD-10-CM

## 2015-09-22 DIAGNOSIS — I10 Essential (primary) hypertension: Secondary | ICD-10-CM | POA: Diagnosis not present

## 2015-09-22 DIAGNOSIS — I255 Ischemic cardiomyopathy: Secondary | ICD-10-CM

## 2015-09-22 DIAGNOSIS — Q211 Atrial septal defect: Secondary | ICD-10-CM

## 2015-09-22 DIAGNOSIS — Z8673 Personal history of transient ischemic attack (TIA), and cerebral infarction without residual deficits: Secondary | ICD-10-CM

## 2015-09-22 DIAGNOSIS — Q2112 Patent foramen ovale: Secondary | ICD-10-CM

## 2015-09-22 MED ORDER — SPIRONOLACTONE 25 MG PO TABS: 12.5000 mg | ORAL_TABLET | Freq: Every day | ORAL | Status: DC

## 2015-09-22 MED ORDER — CARVEDILOL 12.5 MG PO TABS: 12.5000 mg | ORAL_TABLET | Freq: Two times a day (BID) | ORAL | Status: DC

## 2015-09-22 NOTE — Patient Instructions (Signed)
Your physician wants you to follow-up in: 6 months with Dr Ferne Reus will receive a reminder letter in the mail two months in advance. If you don't receive a letter, please call our office to schedule the follow-up appointment.     STOP Lasix   START Aldactone 12.5 mg daily    If you need a refill on your cardiac medications before your next appointment, please call your pharmacy.    Thank you for choosing [facility] !

## 2015-09-22 NOTE — Progress Notes (Signed)
Cardiology Office Note  Date: 09/22/2015   ID: SHEN REITMEIER, DOB 1946-04-16, MRN [id number]  PCP: Wende Neighbors, MD  Primary Cardiologist: Rozann Lesches, MD   Chief Complaint  Patient presents with  . Cardiomyopathy    History of Present Illness: Dennis Yu is a 70 y.o. male last seen in July 2016. He presents for a routine follow-up visit. Reports NYHA class 2-3 dyspnea depending on level of activity, overall no changes. He denies angina symptoms or palpitations, no syncope.  He continues on Coumadin with follow-up in the anticoagulation clinic. Reports no bleeding episodes.  Went over his medications which are outlined below. He continues on Coreg, Imdur, lisinopril, Zocor, Lasix every other day, and Coumadin. Weight has been stable, he uses compression stockings and generally has no significant leg edema. No orthopnea or PND. We discussed taking him off Lasix and starting Aldactone.  I reviewed his echocardiogram from last July, outlined below. LVEF stable at the 25-30% with wall motion amount is consistent with ischemic cardiomyopathy. No definite mural thrombus with trabeculation and non-compacted appearance of the apex. He has preferred a conservative approach with no invasive testing or consideration for ICD.  Past Medical History  Diagnosis Date  . Essential hypertension, benign   . Type 2 diabetes mellitus ([place])   . Mixed hyperlipidemia   . Vitamin D deficiency   . History of colonic polyps   . Erectile dysfunction   . Stroke [facility])     Embolic left temporal [id number] s/p tPA  . Patent foramen ovale   . Cardiomyopathy     LVEF 25%  . Mural thrombus of left ventricle ([facility])   . Obstructive sleep apnea   . GERD (gastroesophageal reflux disease)   . Phlebitis   . Diabetes mellitus   . Bronchitis 11/2012  . Superficial phlebitis 12/10/2012    History reviewed. No pertinent past surgical history.  Current Outpatient Prescriptions  Medication Sig Dispense Refill    . aspirin 81 MG tablet Take 81 mg by mouth daily.      . carvedilol (COREG) 12.5 MG tablet Take 1 tablet (12.5 mg total) by mouth 2 (two) times daily with a meal. 180 tablet 1  . gabapentin (NEURONTIN) 300 MG capsule Take 3 capsules (900 mg total) by mouth daily. 90 capsule 11  . isosorbide mononitrate (IMDUR) 30 MG 24 hr tablet Take 1 tablet (30 mg total) by mouth daily. 90 tablet 1  . lisinopril (PRINIVIL,ZESTRIL) 20 MG tablet Take 1 tablet (20 mg total) by mouth daily. 90 tablet 3  . metFORMIN (GLUCOPHAGE) 500 MG tablet Take 500 mg by mouth 2 (two) times daily with a meal.      . NON FORMULARY legatrin pm Daily      . Omega-3 Fatty Acids (FISH OIL) 1000 MG CAPS Take 1,000 mg by mouth 2 (two) times daily.    [name] PROAIR HFA 108 (90 BASE) MCG/ACT inhaler Inhale 2 puffs into the lungs 4 (four) times daily.     . simvastatin (ZOCOR) 20 MG tablet Take 1 tablet (20 mg total) by mouth at bedtime. 90 tablet 3  . warfarin (COUMADIN) 5 MG tablet TAKE AS DIRECTED  BY  COUMADIN  CLINIC  LISA  REID  RN 90 tablet 3  . spironolactone (ALDACTONE) 25 MG tablet Take 0.5 tablets (12.5 mg total) by mouth daily. 45 tablet 3   No current facility-administered medications for this visit.   Allergies:  Review of patient's allergies indicates no known allergies.  Social History: The patient  reports that he quit smoking about 12 years ago. His smoking use included Cigarettes. He started smoking about 48 years ago. He has never used smokeless tobacco. He reports that he does not drink alcohol or use illicit drugs.   ROS:  Please see the history of present illness. Otherwise, complete review of systems is positive for arthritic pains.  All other systems are reviewed and negative.   Physical Exam: VS:  BP 116/68 mmHg  Pulse 67  Ht 6' (1.829 m)  Wt 192 lb (87.091 kg)  BMI 26.03 kg/m2  SpO2 94%, BMI Body mass index is 26.03 kg/(m^2).  Wt Readings from Last 3 Encounters:  09/22/15 192 lb (87.091 kg)  02/10/15  191 lb (86.637 kg)  12/19/14 192 lb (87.091 kg)    Appears comfortable at rest.  HEENT: Conjuctiva and lids normal, oropharynx clear.  Neck: Supple, no elevated JVP or carotid bruits.  Lungs: Clear to auscultation, nonlabored.  Cardiac: Regular rate and rhythm, indistinct PMI, no S3 gallop or significant murmur.  Abdomen: Soft, nontender, bowel sounds present.  Skin: Warm and dry.  Extremities: No pitting edema.  Musculoskeletal: No kyphosis. Neuropsychiatric: Alert and oriented 3, affect appropriate.  ECG: I personally reviewed the prior tracing from 02/10/2015 which showed sinus rhythm with evidence of old anterolateral and inferior infarct pattern, nonspecific ST changes.  Recent Labwork: 12/19/2014: ALT 18; AST 20; BUN 13; Creatinine, Ser 1.11; Hemoglobin 13.3; Platelets 193; Potassium 4.6; Sodium 139   Other Studies Reviewed Today:  Echocardiogram 02/15/2015: Study Conclusions  - Left ventricle: The cavity size was normal. Wall thickness was increased in a pattern of mild LVH. Systolic function was severely reduced. The estimated ejection fraction was in the range of 25% to 30%. Diffuse hypokinesis. Doppler parameters are consistent with abnormal left ventricular relaxation (grade 1 diastolic dysfunction). - Regional wall motion abnormality: Akinesis of the mid anteroseptal, apical lateral, and apical myocardium. Noncompacted appearance of the apical and distal anteriolateral myocardium. - Aortic valve: Mildly calcified annulus. Trileaflet; mildly thickened leaflets. Valve area (VTI): 3.48 cm^2. Valve area (Vmax): 3.35 cm^2. - Mitral valve: Mildly calcified annulus. Mildly thickened leaflets . There was mild regurgitation. - Left atrium: The atrium was mildly dilated. - Right atrium: The atrium was mildly dilated. - Atrial septum: No defect or patent foramen ovale was identified.  Assessment and Plan:  1. Ischemic cardiomyopathy with LVEF  25-30%, overall symptomatically stable in terms of dyspnea and volume status. No change in weight. Plan is to stop Lasix and initiate Aldactone at 12.5 mg daily. I am also requesting his recent records from Dr. Nevada Crane for review of lab work.  2. Essential hypertension, blood pressure is well controlled today.  3. Ischemic heart disease. No active angina symptoms. Patient prefers conservative overall management without cardiac catheterization. Prior Cardiolite study from 2012 demonstrated area of dense scar in the inferoposterior wall as well as anteroseptal distribution. He did have mild to moderate peri-infarct ischemia in the periapical distribution. Has kept him on aspirin along with his Coumadin, no bleeding problems.  4. Hyperlipidemia, on Zocor. Requesting most recent lab work for review. Last LDL was 53.  5. PFO, inconsistently noted on follow-up echocardiography. He continues on Coumadin.  6. Previous history of embolic left temporal stroke. He continues on aspirin and Coumadin.  Current medicines were reviewed with the patient today.  Disposition: FU with me in 6 months.   Signed, Satira Sark, MD, [facility] 09/22/2015 9:10 AM    [facility] at [facility] [address] [place], [place] [address] Phone: [phone]; Fax: [phone]

## 2015-10-04 ENCOUNTER — Ambulatory Visit (INDEPENDENT_AMBULATORY_CARE_PROVIDER_SITE_OTHER): Payer: Medicare Other | Admitting: *Deleted

## 2015-10-04 DIAGNOSIS — Z5181 Encounter for therapeutic drug level monitoring: Secondary | ICD-10-CM

## 2015-10-04 DIAGNOSIS — I639 Cerebral infarction, unspecified: Secondary | ICD-10-CM | POA: Diagnosis not present

## 2015-10-04 DIAGNOSIS — Z86718 Personal history of other venous thrombosis and embolism: Secondary | ICD-10-CM | POA: Diagnosis not present

## 2015-10-04 DIAGNOSIS — I6349 Cerebral infarction due to embolism of other cerebral artery: Secondary | ICD-10-CM

## 2015-10-04 DIAGNOSIS — Z7901 Long term (current) use of anticoagulants: Secondary | ICD-10-CM | POA: Diagnosis not present

## 2015-10-04 DIAGNOSIS — I6350 Cerebral infarction due to unspecified occlusion or stenosis of unspecified cerebral artery: Secondary | ICD-10-CM | POA: Diagnosis not present

## 2015-10-04 LAB — POCT INR: INR: 1.5

## 2015-10-18 ENCOUNTER — Ambulatory Visit (INDEPENDENT_AMBULATORY_CARE_PROVIDER_SITE_OTHER): Payer: Medicare Other | Admitting: *Deleted

## 2015-10-18 DIAGNOSIS — Z86718 Personal history of other venous thrombosis and embolism: Secondary | ICD-10-CM | POA: Diagnosis not present

## 2015-10-18 DIAGNOSIS — Z7901 Long term (current) use of anticoagulants: Secondary | ICD-10-CM | POA: Diagnosis not present

## 2015-10-18 DIAGNOSIS — Z5181 Encounter for therapeutic drug level monitoring: Secondary | ICD-10-CM | POA: Diagnosis not present

## 2015-10-18 DIAGNOSIS — I6350 Cerebral infarction due to unspecified occlusion or stenosis of unspecified cerebral artery: Secondary | ICD-10-CM | POA: Diagnosis not present

## 2015-10-18 DIAGNOSIS — I639 Cerebral infarction, unspecified: Secondary | ICD-10-CM

## 2015-10-18 DIAGNOSIS — I6349 Cerebral infarction due to embolism of other cerebral artery: Secondary | ICD-10-CM

## 2015-10-18 LAB — POCT INR: INR: 1.8

## 2015-11-01 ENCOUNTER — Ambulatory Visit (INDEPENDENT_AMBULATORY_CARE_PROVIDER_SITE_OTHER): Payer: Medicare Other | Admitting: *Deleted

## 2015-11-01 DIAGNOSIS — Z86718 Personal history of other venous thrombosis and embolism: Secondary | ICD-10-CM

## 2015-11-01 DIAGNOSIS — Z7901 Long term (current) use of anticoagulants: Secondary | ICD-10-CM

## 2015-11-01 DIAGNOSIS — I639 Cerebral infarction, unspecified: Secondary | ICD-10-CM | POA: Diagnosis not present

## 2015-11-01 DIAGNOSIS — I6350 Cerebral infarction due to unspecified occlusion or stenosis of unspecified cerebral artery: Secondary | ICD-10-CM | POA: Diagnosis not present

## 2015-11-01 DIAGNOSIS — I6349 Cerebral infarction due to embolism of other cerebral artery: Secondary | ICD-10-CM

## 2015-11-01 DIAGNOSIS — Z5181 Encounter for therapeutic drug level monitoring: Secondary | ICD-10-CM | POA: Diagnosis not present

## 2015-11-01 LAB — POCT INR: INR: 2.4

## 2015-11-06 ENCOUNTER — Other Ambulatory Visit: Payer: Self-pay | Admitting: Cardiology

## 2015-11-06 MED ORDER — SIMVASTATIN 20 MG PO TABS: 20.0000 mg | ORAL_TABLET | Freq: Every day | ORAL | Status: DC

## 2015-11-06 NOTE — Telephone Encounter (Signed)
Refill complete 

## 2015-11-06 NOTE — Telephone Encounter (Signed)
° °  1. Which medications need to be refilled? (please list name of each medication and dose if known)   simvastatin (ZOCOR) 20 MG tablet       2. Which pharmacy/location (including street and city if local pharmacy) is medication to be sent to? [facility]   3. Do they need a 30 day or 90 day supply? 90 day supply

## 2015-11-22 ENCOUNTER — Ambulatory Visit (INDEPENDENT_AMBULATORY_CARE_PROVIDER_SITE_OTHER): Payer: Medicare Other | Admitting: *Deleted

## 2015-11-22 DIAGNOSIS — I639 Cerebral infarction, unspecified: Secondary | ICD-10-CM | POA: Diagnosis not present

## 2015-11-22 DIAGNOSIS — Z5181 Encounter for therapeutic drug level monitoring: Secondary | ICD-10-CM

## 2015-11-22 DIAGNOSIS — Z86718 Personal history of other venous thrombosis and embolism: Secondary | ICD-10-CM

## 2015-11-22 DIAGNOSIS — Z7901 Long term (current) use of anticoagulants: Secondary | ICD-10-CM | POA: Diagnosis not present

## 2015-11-22 DIAGNOSIS — I6350 Cerebral infarction due to unspecified occlusion or stenosis of unspecified cerebral artery: Secondary | ICD-10-CM

## 2015-11-22 LAB — POCT INR: INR: 3.3

## 2015-11-27 DIAGNOSIS — E119 Type 2 diabetes mellitus without complications: Secondary | ICD-10-CM | POA: Diagnosis not present

## 2015-11-27 DIAGNOSIS — I10 Essential (primary) hypertension: Secondary | ICD-10-CM | POA: Diagnosis not present

## 2015-11-27 DIAGNOSIS — E782 Mixed hyperlipidemia: Secondary | ICD-10-CM | POA: Diagnosis not present

## 2015-11-29 DIAGNOSIS — I10 Essential (primary) hypertension: Secondary | ICD-10-CM | POA: Diagnosis not present

## 2015-11-29 DIAGNOSIS — I482 Chronic atrial fibrillation: Secondary | ICD-10-CM | POA: Diagnosis not present

## 2015-11-29 DIAGNOSIS — E785 Hyperlipidemia, unspecified: Secondary | ICD-10-CM | POA: Diagnosis not present

## 2015-11-29 DIAGNOSIS — E119 Type 2 diabetes mellitus without complications: Secondary | ICD-10-CM | POA: Diagnosis not present

## 2015-12-01 DIAGNOSIS — B351 Tinea unguium: Secondary | ICD-10-CM | POA: Diagnosis not present

## 2015-12-01 DIAGNOSIS — E1142 Type 2 diabetes mellitus with diabetic polyneuropathy: Secondary | ICD-10-CM | POA: Diagnosis not present

## 2015-12-13 ENCOUNTER — Ambulatory Visit (INDEPENDENT_AMBULATORY_CARE_PROVIDER_SITE_OTHER): Payer: Medicare Other | Admitting: *Deleted

## 2015-12-13 DIAGNOSIS — Z5181 Encounter for therapeutic drug level monitoring: Secondary | ICD-10-CM | POA: Diagnosis not present

## 2015-12-13 DIAGNOSIS — I6350 Cerebral infarction due to unspecified occlusion or stenosis of unspecified cerebral artery: Secondary | ICD-10-CM | POA: Diagnosis not present

## 2015-12-13 DIAGNOSIS — I639 Cerebral infarction, unspecified: Secondary | ICD-10-CM | POA: Diagnosis not present

## 2015-12-13 DIAGNOSIS — Z7901 Long term (current) use of anticoagulants: Secondary | ICD-10-CM

## 2015-12-13 DIAGNOSIS — Z86718 Personal history of other venous thrombosis and embolism: Secondary | ICD-10-CM | POA: Diagnosis not present

## 2015-12-13 LAB — POCT INR: INR: 3.9

## 2015-12-21 ENCOUNTER — Encounter (HOSPITAL_COMMUNITY): Payer: Self-pay | Admitting: Hematology & Oncology

## 2015-12-21 ENCOUNTER — Encounter (HOSPITAL_COMMUNITY): Payer: Medicare Other

## 2015-12-21 ENCOUNTER — Encounter (HOSPITAL_COMMUNITY): Payer: Medicare Other | Attending: Hematology & Oncology | Admitting: Hematology & Oncology

## 2015-12-21 DIAGNOSIS — Z8673 Personal history of transient ischemic attack (TIA), and cerebral infarction without residual deficits: Secondary | ICD-10-CM | POA: Diagnosis not present

## 2015-12-21 DIAGNOSIS — I809 Phlebitis and thrombophlebitis of unspecified site: Secondary | ICD-10-CM

## 2015-12-21 DIAGNOSIS — Z8672 Personal history of thrombophlebitis: Secondary | ICD-10-CM | POA: Diagnosis not present

## 2015-12-21 NOTE — Progress Notes (Signed)
Dennis Neighbors, MD [place] [address]   Superficial Phlebitis of the right leg History of CVA  CURRENT THERAPY: Observation   INTERVAL HISTORY: Dennis Yu 70 y.o. male returns for  regular  visit for followup of superficial phlebitis of his right leg presenting here for the first time on 11/06/2007. He had a 1 time extension from the upper thigh in the past all the way down to his lower leg. However, he is primarily had only the lower leg involvement. He also had venous stasis ulcers in the past but none recently. He is utilizing pressure stocking.  The patient is having his INR's monitored by his cardiologist.  Dennis Yu is unaccompanied.  He is still taking coumadin. Denies any bleeding or hematuria. His appetite is good. Bowels are good. Denies abdominal pain.   Reports his energy level is different, explaining he is not getting around as good - attributing this to the stroke he had 3 years ago. He reports his balance is not good. When he first had the stroke, he had therapy but has not been back since. Denies falling, stating the last time he fell was over a year ago. He uses a cane to keep his balance. Reports he has no coordination on his left side, though this is improved since when he first had the stroke. He does not get a lot of use out of his left side, though he has gotten used to it. States he would like to "hold off" on physical therapy for now.    Past Medical History  Diagnosis Date  . Essential hypertension, benign   . Type 2 diabetes mellitus ([place])   . Mixed hyperlipidemia   . Vitamin D deficiency   . History of colonic polyps   . Erectile dysfunction   . 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 Patent foramen ovale   . Cardiomyopathy     LVEF 25%  . Mural thrombus of left ventricle (HCC)   . Obstructive sleep apnea   . GERD (gastroesophageal reflux disease)   . Phlebitis   . Diabetes mellitus   . Bronchitis 11/2012   . Superficial phlebitis 12/10/2012    has DIABETES MELLITUS, TYPE II, CONTROLLED, W/NEURO COMPS; Mixed hyperlipidemia; Essential hypertension, benign; Long term (current) use of anticoagulants; Embolic stroke ([place]); Patent foramen ovale; Coronary atherosclerosis of native coronary artery; Mural thrombus of left ventricle ([place]); Secondary cardiomyopathy ([place]); Superficial phlebitis; and Encounter for therapeutic drug monitoring on his problem list.     has No Known Allergies.  Dennis Yu does not currently have medications on file.  No past surgical history on file.   He lives with his wife and has been married 4-5 years.  He has children, 3 from his last wife, 3 from his first wife.  He has 9 grandchildren.  He has 6 brothers and 6 sisters, all with the same mother and father.   Denies any headaches, dizziness, double vision, fevers, chills, night sweats, nausea, vomiting, diarrhea, constipation, chest pain, heart palpitations, blood in stool, black tarry stool, urinary pain, urinary burning, urinary frequency, hematuria. Positive for balance problems since stroke. Positive for Shortness of Breath since stroke. Positive for focal weakness to the left side.  PHYSICAL EXAMINATION  ECOG PERFORMANCE STATUS: 1 - Symptomatic but completely ambulatory  There were no vitals filed for this visit.  GENERAL:alert, no distress, well nourished, well developed, comfortable, cooperative and smiling  Visibly unsteady when getting up. SKIN: skin color, texture, turgor are normal, no rashes or significant lesions HEAD: Normocephalic, No masses, lesions, tenderness or abnormalities EYES: normal, PERRLA, EOMI, Conjunctiva are pink and non-injected EARS: External ears normal OROPHARYNX:mucous membranes are moist  NECK: supple, no adenopathy, trachea midline LYMPH:  no palpable lymphadenopathy BREAST:not examined LUNGS: clear to auscultation and percussion HEART: regular rate & rhythm, no murmurs  and no gallops ABDOMEN:abdomen soft and normal bowel sounds BACK: Back symmetric, no curvature. EXTREMITIES:less then 2 second capillary refill, no joint deformities, effusion, or inflammation, no skin discoloration, no clubbing, no cyanosis      Complains of focal weakness to the left side and balance issues. NEURO: alert & oriented x 3 with fluent speech, imbalance on standing but then little difficulty with ambulation   LABORATORY DATA: I have reviewed the data as listed. CBC    Component Value Date/Time   WBC 7.3 12/19/2014 0904   RBC 4.74 12/19/2014 0904   HGB 13.3 12/19/2014 0904   HCT 42.6 12/19/2014 0904   PLT 193 12/19/2014 0904   MCV 89.9 12/19/2014 0904   MCH 28.1 12/19/2014 0904   MCHC 31.2 12/19/2014 0904   RDW 12.6 12/19/2014 0904   LYMPHSABS 2.1 12/19/2014 0904   MONOABS 0.4 12/19/2014 0904   EOSABS 0.4 12/19/2014 0904   BASOSABS 0.0 12/19/2014 0904      Chemistry      Component Value Date/Time   NA 139 12/19/2014 0904   K 4.6 12/19/2014 0904   CL 106 12/19/2014 0904   CO2 26 12/19/2014 0904   BUN 13 12/19/2014 0904   CREATININE 1.11 12/19/2014 0904   CREATININE 1.04 02/10/2013 0825      Component Value Date/Time   CALCIUM 9.2 12/19/2014 0904   ALKPHOS 51 12/19/2014 0904   AST 20 12/19/2014 0904   ALT 18 12/19/2014 0904   BILITOT 0.4 12/19/2014 0904        ASSESSMENT:  1. Superficial phlebitis of his right leg presenting here for the first time on 11/06/2007. He had a 1 time extension from the upper thigh in the past all the way down to his lower leg. However, he is primarily had only the lower leg involvement. He also had venous stasis ulcers in the past but none recently.  2. Stroke affecting left-sided body in August 2012  3. History of smoking times many years, quit  4. Hypercholesterolemia  5. Sleep apnea syndrome.  6. 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 Diabetes mellitus times many years  8. HTN,  followed by cardiology  9. CAD, followed by cardiology  10. Patent foramen ovale, followed by cardiology  11. Mural thrombus of left ventricle, followed by cardiology    Patient Active Problem List   Diagnosis Date Noted  . Encounter for therapeutic drug monitoring 09/02/2013  . Superficial phlebitis 12/10/2012  . Secondary cardiomyopathy ([place]) 09/03/2011  . Long term (current) use of anticoagulants 05/27/2011  . Embolic stroke ([place]) 99991111  . Patent foramen ovale 05/27/2011  . Coronary atherosclerosis of native coronary artery 05/27/2011  . Mural thrombus of left ventricle ([place]) 05/27/2011  . DIABETES MELLITUS, TYPE II, CONTROLLED, W/NEURO COMPS 07/01/2006  . Mixed hyperlipidemia 07/01/2006  . Essential hypertension, benign 07/01/2006     PLAN:  1. Recommend follow-up with PCP as directed 2. Follow-up with cardiology as directed 3. Patient wishes to continue annual follow-up.  We will see him in 1 year.  I spoke with the patient about resuming physical therapy for the stroke he had 3 years ago to improve continued balance issues and left sided weakness. He will contact us if he would like a referral.  All questions were answered. The patient knows to call the clinic with any problems, questions or concerns. We can certainly see the patient much sooner if necessary.  This document serves as a record of services personally performed by Ancil Linsey, MD. It was created on her behalf by Arlyce Harman, a trained medical scribe. The creation of this record is based on the scribe's personal observations and the provider's statements to them. This document has been checked and approved by the attending provider.  I have reviewed the above documentation for accuracy and completeness, and I agree with the above. Donald Pore MD

## 2015-12-21 NOTE — Patient Instructions (Signed)
[place] at [facility] Discharge Instructions  RECOMMENDATIONS MADE BY THE CONSULTANT AND ANY TEST RESULTS WILL BE SENT TO YOUR REFERRING PHYSICIAN.  Return in 1 year   Thank you for choosing [place] at [facility] to provide your oncology and hematology care.  To afford each patient quality time with our provider, please arrive at least 15 minutes before your scheduled appointment time.   Beginning January 23rd 2017 lab work for the [facility] will be done in the  Main lab at [facility] on 1st floor. If you have a lab appointment with the [facility] please come in thru the  Main Entrance and check in at the main information desk  You need to re-schedule your appointment should you arrive 10 or more minutes late.  We strive to give you quality time with our providers, and arriving late affects you and other patients whose appointments are after yours.  Also, if you no show three or more times for appointments you may be dismissed from the clinic at the providers discretion.     Again, thank you for choosing [facility]  Our hope is that these requests will decrease the amount of time that you wait before being seen by our physicians.       _____________________________________________________________  Should you have questions after your visit to [facility], please contact our office at [phone] between the hours of 8:30 a.m. and 4:30 p.m.  Voicemails left after 4:30 p.m. will not be returned until the following business day.  For prescription refill requests, have your pharmacy contact our office.         Resources For Cancer Patients and their Caregivers ? American Cancer Society: Can assist with transportation, wigs, general needs, runs Look Good Feel Better.        314-005-2656 ? Cancer Care: Provides financial assistance, online support groups, medication/co-pay assistance.   1-800-813-HOPE (857)795-4759) ? Mobile Assists [place] cancer patients and their families through emotional , educational and financial support.  707-021-5473 ? [place] DSS Where to apply for food stamps, Medicaid and utility assistance. 367 303 2194 ? [employer]: Transportation to medical appointments. 619-154-6875 ? Social Security Administration: May apply for disability if have a Stage IV cancer. 7193316024 512-643-0637 ? [employer], Disability and Transit Services: Assists with nutrition, care and transit needs. [place] Support Programs: @10RELATIVEDAYS @ > Cancer Support Group  2nd Tuesday of the month 1pm-2pm, Journey Room  > Creative Journey  3rd Tuesday of the month 1130am-1pm, Journey Room  > Look Good Feel Better  1st Wednesday of the month 10am-12 noon, Journey Room (Call [place] to register [phone])

## 2016-01-03 ENCOUNTER — Ambulatory Visit (INDEPENDENT_AMBULATORY_CARE_PROVIDER_SITE_OTHER): Payer: Medicare Other | Admitting: *Deleted

## 2016-01-03 DIAGNOSIS — Z5181 Encounter for therapeutic drug level monitoring: Secondary | ICD-10-CM

## 2016-01-03 DIAGNOSIS — Z86718 Personal history of other venous thrombosis and embolism: Secondary | ICD-10-CM

## 2016-01-03 DIAGNOSIS — I639 Cerebral infarction, unspecified: Secondary | ICD-10-CM

## 2016-01-03 DIAGNOSIS — I6350 Cerebral infarction due to unspecified occlusion or stenosis of unspecified cerebral artery: Secondary | ICD-10-CM | POA: Diagnosis not present

## 2016-01-03 DIAGNOSIS — Z7901 Long term (current) use of anticoagulants: Secondary | ICD-10-CM

## 2016-01-03 LAB — POCT INR: INR: 3

## 2016-01-31 ENCOUNTER — Ambulatory Visit (INDEPENDENT_AMBULATORY_CARE_PROVIDER_SITE_OTHER): Payer: Medicare Other | Admitting: *Deleted

## 2016-01-31 DIAGNOSIS — Z5181 Encounter for therapeutic drug level monitoring: Secondary | ICD-10-CM

## 2016-01-31 DIAGNOSIS — I6350 Cerebral infarction due to unspecified occlusion or stenosis of unspecified cerebral artery: Secondary | ICD-10-CM | POA: Diagnosis not present

## 2016-01-31 DIAGNOSIS — I639 Cerebral infarction, unspecified: Secondary | ICD-10-CM | POA: Diagnosis not present

## 2016-01-31 DIAGNOSIS — I6349 Cerebral infarction due to embolism of other cerebral artery: Secondary | ICD-10-CM

## 2016-01-31 DIAGNOSIS — Z7901 Long term (current) use of anticoagulants: Secondary | ICD-10-CM

## 2016-01-31 DIAGNOSIS — Z86718 Personal history of other venous thrombosis and embolism: Secondary | ICD-10-CM | POA: Diagnosis not present

## 2016-01-31 LAB — POCT INR: INR: 2.6

## 2016-02-07 ENCOUNTER — Other Ambulatory Visit: Payer: Self-pay | Admitting: *Deleted

## 2016-02-07 MED ORDER — SIMVASTATIN 20 MG PO TABS: 20.0000 mg | ORAL_TABLET | Freq: Every day | ORAL | Status: DC

## 2016-02-13 ENCOUNTER — Other Ambulatory Visit: Payer: Self-pay | Admitting: Cardiology

## 2016-02-16 DIAGNOSIS — E1142 Type 2 diabetes mellitus with diabetic polyneuropathy: Secondary | ICD-10-CM | POA: Diagnosis not present

## 2016-02-16 DIAGNOSIS — B351 Tinea unguium: Secondary | ICD-10-CM | POA: Diagnosis not present

## 2016-02-28 ENCOUNTER — Ambulatory Visit (INDEPENDENT_AMBULATORY_CARE_PROVIDER_SITE_OTHER): Payer: Medicare Other | Admitting: *Deleted

## 2016-02-28 DIAGNOSIS — Z5181 Encounter for therapeutic drug level monitoring: Secondary | ICD-10-CM

## 2016-02-28 DIAGNOSIS — Z7901 Long term (current) use of anticoagulants: Secondary | ICD-10-CM

## 2016-02-28 DIAGNOSIS — I6350 Cerebral infarction due to unspecified occlusion or stenosis of unspecified cerebral artery: Secondary | ICD-10-CM

## 2016-02-28 DIAGNOSIS — Z86718 Personal history of other venous thrombosis and embolism: Secondary | ICD-10-CM | POA: Diagnosis not present

## 2016-02-28 DIAGNOSIS — I639 Cerebral infarction, unspecified: Secondary | ICD-10-CM

## 2016-02-28 LAB — POCT INR: INR: 2.7

## 2016-03-25 NOTE — Progress Notes (Signed)
Cardiology Office Note  Date: 03/26/2016   ID: Dennis Yu, Dennis Yu [birth year], MRN [id number]  PCP: Wende Neighbors, MD  Primary Cardiologist: Rozann Lesches, MD   Chief Complaint  Patient presents with  . Cardiomyopathy    History of Present Illness: Dennis Yu is a 70 y.o. male last seen in February. He presents for a routine follow-up visit. Reports NYHA class 2-3 dyspnea pending on level of activity. He enjoys doing some light carpentry work, Surveyor, minerals. He does not report any angina symptoms.  He continues on Coumadin, followed in the anticoagulation clinic. Denies any unusual bleeding episodes.  I reviewed his cardiac regimen which is outlined below. He follows lab work through Dr. Juel Burrow office, due for a physical in the next few months.  Patient prefers conservative overall management without cardiac catheterization. Prior Cardiolite study from 2012 demonstrated area of dense scar in the inferoposterior wall as well as anteroseptal distribution. He did have mild to moderate peri-infarct ischemia in the periapical distribution. Last echocardiogram from July 2016 is outlined below.  Past Medical History:  Diagnosis Date  . Bronchitis 11/2012  . Cardiomyopathy    LVEF 25%  . Diabetes mellitus   . Erectile dysfunction   . Essential hypertension, benign   . GERD (gastroesophageal reflux disease)   . History of colonic polyps   . Mixed hyperlipidemia   . Mural thrombus of left ventricle (HCC)   . Obstructive sleep apnea   . Patent foramen ovale   . Phlebitis   . 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 Superficial phlebitis 12/10/2012  . Type 2 diabetes mellitus ([place])   . Vitamin D deficiency     History reviewed. No pertinent surgical history.  Current Outpatient Prescriptions  Medication Sig Dispense Refill  . aspirin 81 MG tablet Take 81 mg by mouth daily.      . carvedilol (COREG) 12.5 MG tablet TAKE 1 TABLET (12.5 MG TOTAL) BY  MOUTH 2 (TWO) TIMES DAILY WITH A MEAL. 180 tablet 3  . gabapentin (NEURONTIN) 300 MG capsule Take 3 capsules (900 mg total) by mouth daily. 90 capsule 11  . isosorbide mononitrate (IMDUR) 30 MG 24 hr tablet Take 1 tablet (30 mg total) by mouth daily. 90 tablet 1  . lisinopril (PRINIVIL,ZESTRIL) 20 MG tablet Take 1 tablet (20 mg total) by mouth daily. 90 tablet 3  . metFORMIN (GLUCOPHAGE) 500 MG tablet Take 500 mg by mouth 2 (two) times daily with a meal.      . NON FORMULARY legatrin pm Daily      . Omega-3 Fatty Acids (FISH OIL) 1000 MG CAPS Take 1,000 mg by mouth 2 (two) times daily.    [name] PROAIR HFA 108 (90 BASE) MCG/ACT inhaler Inhale 2 puffs into the lungs 4 (four) times daily.     . simvastatin (ZOCOR) 20 MG tablet Take 1 tablet (20 mg total) by mouth at bedtime. 90 tablet 3  . spironolactone (ALDACTONE) 25 MG tablet Take 0.5 tablets (12.5 mg total) by mouth daily. 45 tablet 3  . warfarin (COUMADIN) 5 MG tablet TAKE AS DIRECTED  BY  COUMADIN  CLINIC  LISA  REID  RN 90 tablet 3   No current facility-administered medications for this visit.    Allergies:  Review of patient's allergies indicates no known allergies.   Social History: The patient  reports that he quit smoking about 12 years ago. His smoking use included Cigarettes. He started  smoking about 48 years ago. He has never used smokeless tobacco. He reports that he does not drink alcohol or use drugs.   ROS:  Please see the history of present illness. Otherwise, complete review of systems is positive for left rotator cuff pain.  All other systems are reviewed and negative.   Physical Exam: VS:  BP 124/64   Pulse 66   Ht 6' (1.829 m)   Wt 195 lb (88.5 kg)   SpO2 95%   BMI 26.45 kg/m , BMI Body mass index is 26.45 kg/m.  Wt Readings from Last 3 Encounters:  03/26/16 195 lb (88.5 kg)  09/22/15 192 lb (87.1 kg)  02/10/15 191 lb (86.6 kg)    General: Overweight male, appears comfortable at rest. HEENT: Conjunctiva and lids  normal, oropharynx clear. Neck: Supple, no elevated JVP or carotid bruits, no thyromegaly. Lungs: Clear to auscultation, nonlabored breathing at rest. Cardiac: Regular rate and rhythm, no S3, 2/6 systolic murmur, no pericardial rub. Abdomen: Soft, nontender, bowel sounds present, no guarding or rebound. Extremities: Trace ankle edema, distal pulses 2+. Skin: Warm and dry. Musculoskeletal: No kyphosis. Neuropsychiatric: Alert and oriented x3, affect grossly appropriate.  ECG: I personally reviewed the tracing from 02/10/2015 which showed sinus rhythm with evidence of old inferolateral and anterolateral infarct.  Recent Labwork:  October 2016: Potassium 4.2, BUN 17, creatinine 1.2, AST 17, ALT 15, hemoglobin 12.9, platelets 201, cholesterol 119, triglycerides 114, HDL 28, LDL 68, hemogram A1c 6.5  Other Studies Reviewed Today:  Echocardiogram 02/15/2015: Study Conclusions  - Left ventricle: The cavity size was normal. Wall thickness was increased in a pattern of mild LVH. Systolic function was severely reduced. The estimated ejection fraction was in the range of 25% to 30%. Diffuse hypokinesis. Doppler parameters are consistent with abnormal left ventricular relaxation (grade 1 diastolic dysfunction). - Regional wall motion abnormality: Akinesis of the mid anteroseptal, apical lateral, and apical myocardium. Noncompacted appearance of the apical and distal anteriolateral myocardium. - Aortic valve: Mildly calcified annulus. Trileaflet; mildly thickened leaflets. Valve area (VTI): 3.48 cm^2. Valve area (Vmax): 3.35 cm^2. - Mitral valve: Mildly calcified annulus. Mildly thickened leaflets . There was mild regurgitation. - Left atrium: The atrium was mildly dilated. - Right atrium: The atrium was mildly dilated. - Atrial septum: No defect or patent foramen ovale was identified.  Assessment and Plan:  1. Chronic combined heart failure. He remains symptomatically  stable on present medical regimen. No significant change in weight or progressive leg edema.  2. Ischemic cardiomyopathy with LVEF 25-30%. He has preferred conservative overall management without further invasive testing or consideration of ICD. We will update his echocardiogram to reassess LV function.  3. Hyperlipidemia, on Zocor. Last LDL 68.  4. PFO, inconsistently visualized by echocardiography. He is on Coumadin with history of embolic left temporal stroke in the past.  Current medicines were reviewed with the patient today.   Orders Placed This Encounter  Procedures  . EKG 12-Lead  . ECHOCARDIOGRAM COMPLETE    Disposition: Follow-up with me in 6 months.  Signed, Satira Sark, MD, [facility] 03/26/2016 8:27 AM    [facility] at [facility]. [address] [place], [place] [address] Phone: [phone]; Fax: [phone]

## 2016-03-26 ENCOUNTER — Ambulatory Visit (INDEPENDENT_AMBULATORY_CARE_PROVIDER_SITE_OTHER): Payer: Medicare Other | Admitting: Cardiology

## 2016-03-26 ENCOUNTER — Encounter: Payer: Self-pay | Admitting: Cardiology

## 2016-03-26 VITALS — BP 124/64 | HR 66 | Ht 72.0 in | Wt 195.0 lb

## 2016-03-26 DIAGNOSIS — E785 Hyperlipidemia, unspecified: Secondary | ICD-10-CM | POA: Diagnosis not present

## 2016-03-26 DIAGNOSIS — I255 Ischemic cardiomyopathy: Secondary | ICD-10-CM

## 2016-03-26 DIAGNOSIS — Q2112 Patent foramen ovale: Secondary | ICD-10-CM

## 2016-03-26 DIAGNOSIS — I5042 Chronic combined systolic (congestive) and diastolic (congestive) heart failure: Secondary | ICD-10-CM | POA: Diagnosis not present

## 2016-03-26 DIAGNOSIS — I6350 Cerebral infarction due to unspecified occlusion or stenosis of unspecified cerebral artery: Secondary | ICD-10-CM

## 2016-03-26 DIAGNOSIS — Q211 Atrial septal defect: Secondary | ICD-10-CM

## 2016-03-26 NOTE — Patient Instructions (Signed)
Your physician wants you to follow-up in: 6 months with Dr Ferne Reus will receive a reminder letter in the mail two months in advance. If you don't receive a letter, please call our office to schedule the follow-up appointment.    Your physician has requested that you have an echocardiogram. Echocardiography is a painless test that uses sound waves to create images of your heart. It provides your doctor with information about the size and shape of your heart and how well your heart's chambers and valves are working. This procedure takes approximately one hour. There are no restrictions for this procedure.    Your physician recommends that you continue on your current medications as directed. Please refer to the Current Medication list given to you today.    Thank you for choosing [facility] !

## 2016-04-09 ENCOUNTER — Other Ambulatory Visit: Payer: Self-pay

## 2016-04-10 ENCOUNTER — Ambulatory Visit (HOSPITAL_COMMUNITY)
Admission: RE | Admit: 2016-04-10 | Discharge: 2016-04-10 | Disposition: A | Discharge status: Home / Self Care | Payer: Medicare Other | Source: Ambulatory Visit | Attending: Cardiology | Admitting: Cardiology

## 2016-04-10 ENCOUNTER — Ambulatory Visit (INDEPENDENT_AMBULATORY_CARE_PROVIDER_SITE_OTHER): Payer: Medicare Other | Admitting: *Deleted

## 2016-04-10 DIAGNOSIS — I6350 Cerebral infarction due to unspecified occlusion or stenosis of unspecified cerebral artery: Secondary | ICD-10-CM | POA: Diagnosis not present

## 2016-04-10 DIAGNOSIS — I639 Cerebral infarction, unspecified: Secondary | ICD-10-CM | POA: Diagnosis not present

## 2016-04-10 DIAGNOSIS — I429 Cardiomyopathy, unspecified: Secondary | ICD-10-CM | POA: Diagnosis present

## 2016-04-10 DIAGNOSIS — I119 Hypertensive heart disease without heart failure: Secondary | ICD-10-CM | POA: Diagnosis not present

## 2016-04-10 DIAGNOSIS — E119 Type 2 diabetes mellitus without complications: Secondary | ICD-10-CM | POA: Diagnosis not present

## 2016-04-10 DIAGNOSIS — Z7901 Long term (current) use of anticoagulants: Secondary | ICD-10-CM | POA: Diagnosis not present

## 2016-04-10 DIAGNOSIS — E785 Hyperlipidemia, unspecified: Secondary | ICD-10-CM | POA: Diagnosis not present

## 2016-04-10 DIAGNOSIS — I77810 Thoracic aortic ectasia: Secondary | ICD-10-CM | POA: Diagnosis not present

## 2016-04-10 DIAGNOSIS — I255 Ischemic cardiomyopathy: Secondary | ICD-10-CM | POA: Insufficient documentation

## 2016-04-10 DIAGNOSIS — I340 Nonrheumatic mitral (valve) insufficiency: Secondary | ICD-10-CM | POA: Insufficient documentation

## 2016-04-10 DIAGNOSIS — Z86718 Personal history of other venous thrombosis and embolism: Secondary | ICD-10-CM | POA: Diagnosis not present

## 2016-04-10 DIAGNOSIS — R29898 Other symptoms and signs involving the musculoskeletal system: Secondary | ICD-10-CM | POA: Insufficient documentation

## 2016-04-10 DIAGNOSIS — Z5181 Encounter for therapeutic drug level monitoring: Secondary | ICD-10-CM

## 2016-04-10 LAB — POCT INR: INR: 3.5

## 2016-04-10 NOTE — Progress Notes (Signed)
*  PRELIMINARY RESULTS* Echocardiogram 2D Echocardiogram has been performed.  Dennis Yu 04/10/2016, 9:05 AM

## 2016-04-26 DIAGNOSIS — E1142 Type 2 diabetes mellitus with diabetic polyneuropathy: Secondary | ICD-10-CM | POA: Diagnosis not present

## 2016-04-26 DIAGNOSIS — B351 Tinea unguium: Secondary | ICD-10-CM | POA: Diagnosis not present

## 2016-05-05 ENCOUNTER — Other Ambulatory Visit: Payer: Self-pay | Admitting: Cardiology

## 2016-05-08 ENCOUNTER — Ambulatory Visit (INDEPENDENT_AMBULATORY_CARE_PROVIDER_SITE_OTHER): Payer: Medicare Other | Admitting: *Deleted

## 2016-05-08 DIAGNOSIS — Z86718 Personal history of other venous thrombosis and embolism: Secondary | ICD-10-CM

## 2016-05-08 DIAGNOSIS — Z5181 Encounter for therapeutic drug level monitoring: Secondary | ICD-10-CM

## 2016-05-08 DIAGNOSIS — I6350 Cerebral infarction due to unspecified occlusion or stenosis of unspecified cerebral artery: Secondary | ICD-10-CM | POA: Diagnosis not present

## 2016-05-08 DIAGNOSIS — Z7901 Long term (current) use of anticoagulants: Secondary | ICD-10-CM

## 2016-05-08 DIAGNOSIS — I639 Cerebral infarction, unspecified: Secondary | ICD-10-CM | POA: Diagnosis not present

## 2016-05-08 LAB — POCT INR: INR: 3.3

## 2016-06-05 ENCOUNTER — Ambulatory Visit (INDEPENDENT_AMBULATORY_CARE_PROVIDER_SITE_OTHER): Payer: Medicare Other | Admitting: *Deleted

## 2016-06-05 DIAGNOSIS — E782 Mixed hyperlipidemia: Secondary | ICD-10-CM | POA: Diagnosis not present

## 2016-06-05 DIAGNOSIS — Z7901 Long term (current) use of anticoagulants: Secondary | ICD-10-CM | POA: Diagnosis not present

## 2016-06-05 DIAGNOSIS — I6350 Cerebral infarction due to unspecified occlusion or stenosis of unspecified cerebral artery: Secondary | ICD-10-CM | POA: Diagnosis not present

## 2016-06-05 DIAGNOSIS — Z86718 Personal history of other venous thrombosis and embolism: Secondary | ICD-10-CM

## 2016-06-05 DIAGNOSIS — I639 Cerebral infarction, unspecified: Secondary | ICD-10-CM | POA: Diagnosis not present

## 2016-06-05 DIAGNOSIS — E119 Type 2 diabetes mellitus without complications: Secondary | ICD-10-CM | POA: Diagnosis not present

## 2016-06-05 DIAGNOSIS — E785 Hyperlipidemia, unspecified: Secondary | ICD-10-CM | POA: Diagnosis not present

## 2016-06-05 DIAGNOSIS — E039 Hypothyroidism, unspecified: Secondary | ICD-10-CM | POA: Diagnosis not present

## 2016-06-05 DIAGNOSIS — I10 Essential (primary) hypertension: Secondary | ICD-10-CM | POA: Diagnosis not present

## 2016-06-05 DIAGNOSIS — Z5181 Encounter for therapeutic drug level monitoring: Secondary | ICD-10-CM

## 2016-06-05 DIAGNOSIS — R7301 Impaired fasting glucose: Secondary | ICD-10-CM | POA: Diagnosis not present

## 2016-06-05 DIAGNOSIS — I482 Chronic atrial fibrillation: Secondary | ICD-10-CM | POA: Diagnosis not present

## 2016-06-05 DIAGNOSIS — N529 Male erectile dysfunction, unspecified: Secondary | ICD-10-CM | POA: Diagnosis not present

## 2016-06-05 LAB — POCT INR: INR: 2.6

## 2016-06-10 ENCOUNTER — Other Ambulatory Visit: Payer: Self-pay | Admitting: Cardiology

## 2016-06-14 DIAGNOSIS — Z23 Encounter for immunization: Secondary | ICD-10-CM | POA: Diagnosis not present

## 2016-06-14 DIAGNOSIS — I10 Essential (primary) hypertension: Secondary | ICD-10-CM | POA: Diagnosis not present

## 2016-06-14 DIAGNOSIS — E785 Hyperlipidemia, unspecified: Secondary | ICD-10-CM | POA: Diagnosis not present

## 2016-06-14 DIAGNOSIS — E119 Type 2 diabetes mellitus without complications: Secondary | ICD-10-CM | POA: Diagnosis not present

## 2016-06-14 DIAGNOSIS — R0602 Shortness of breath: Secondary | ICD-10-CM | POA: Diagnosis not present

## 2016-06-14 DIAGNOSIS — I482 Chronic atrial fibrillation: Secondary | ICD-10-CM | POA: Diagnosis not present

## 2016-07-02 DIAGNOSIS — R0602 Shortness of breath: Secondary | ICD-10-CM | POA: Diagnosis not present

## 2016-07-08 ENCOUNTER — Ambulatory Visit (INDEPENDENT_AMBULATORY_CARE_PROVIDER_SITE_OTHER): Payer: Medicare Other | Admitting: *Deleted

## 2016-07-08 DIAGNOSIS — Z86718 Personal history of other venous thrombosis and embolism: Secondary | ICD-10-CM

## 2016-07-08 DIAGNOSIS — I6350 Cerebral infarction due to unspecified occlusion or stenosis of unspecified cerebral artery: Secondary | ICD-10-CM

## 2016-07-08 DIAGNOSIS — I639 Cerebral infarction, unspecified: Secondary | ICD-10-CM

## 2016-07-08 DIAGNOSIS — Z5181 Encounter for therapeutic drug level monitoring: Secondary | ICD-10-CM

## 2016-07-08 DIAGNOSIS — Z7901 Long term (current) use of anticoagulants: Secondary | ICD-10-CM

## 2016-07-08 LAB — POCT INR: INR: 2.6

## 2016-07-12 DIAGNOSIS — E1142 Type 2 diabetes mellitus with diabetic polyneuropathy: Secondary | ICD-10-CM | POA: Diagnosis not present

## 2016-07-12 DIAGNOSIS — B351 Tinea unguium: Secondary | ICD-10-CM | POA: Diagnosis not present

## 2016-07-16 DIAGNOSIS — R0602 Shortness of breath: Secondary | ICD-10-CM | POA: Diagnosis not present

## 2016-08-12 ENCOUNTER — Ambulatory Visit (INDEPENDENT_AMBULATORY_CARE_PROVIDER_SITE_OTHER): Payer: Medicare Other | Admitting: *Deleted

## 2016-08-12 DIAGNOSIS — I639 Cerebral infarction, unspecified: Secondary | ICD-10-CM | POA: Diagnosis not present

## 2016-08-12 DIAGNOSIS — Z7901 Long term (current) use of anticoagulants: Secondary | ICD-10-CM | POA: Diagnosis not present

## 2016-08-12 DIAGNOSIS — Z86718 Personal history of other venous thrombosis and embolism: Secondary | ICD-10-CM

## 2016-08-12 DIAGNOSIS — I6350 Cerebral infarction due to unspecified occlusion or stenosis of unspecified cerebral artery: Secondary | ICD-10-CM | POA: Diagnosis not present

## 2016-08-12 DIAGNOSIS — Z5181 Encounter for therapeutic drug level monitoring: Secondary | ICD-10-CM

## 2016-08-12 LAB — POCT INR: INR: 2.8

## 2016-09-09 NOTE — Progress Notes (Signed)
Cardiology Office Note  Date: 09/10/2016   ID: RICHARDSON KLEINOW, DOB [birth year], MRN [id number]  PCP: Wende Neighbors, MD  Primary Cardiologist: Rozann Lesches, MD   Chief Complaint  Patient presents with  . Cardiomyopathy    History of Present Illness: Dennis Yu is a 71 y.o. male last seen in August 2017. He presents for a follow-up visit. He states that over the last 3 months he has been more short of breath and having wheezing, also a component of orthopnea. Feels that his abdomen is more protuberant and he has had some mild leg swelling. We did switch him from Lasix to Aldactone in the last 6 months, however this is been changed back to Lasix by his PCP. On Lasix 20 twice daily he has not noticed much improvement. His weight is up about 10 pounds compared to last year.  He continues to follow in the anticoagulation clinic on Coumadin. No reported bleeding episodes.  Patient prefers conservative overall management without cardiac catheterization. Prior Cardiolite study from 2012 demonstrated area of dense scar in the inferoposterior wall as well as anteroseptal distribution. He did have mild to moderate peri-infarct ischemia in the periapical distribution.  Follow-up echocardiogram from September of last year is outlined below showing stable LVEF.  Past Medical History:  Diagnosis Date  . Bronchitis 11/2012  . Cardiomyopathy    LVEF 25%  . Diabetes mellitus   . Erectile dysfunction   . Essential hypertension, benign   . GERD (gastroesophageal reflux disease)   . History of colonic polyps   . Mixed hyperlipidemia   . 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 Obstructive sleep apnea   . Patent foramen ovale   . Phlebitis   . 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 Superficial phlebitis 12/10/2012  . Type 2 diabetes mellitus ([place])   . Vitamin D deficiency     History reviewed. No pertinent surgical history.  Current Outpatient Prescriptions  Medication Sig  Dispense Refill  . aspirin 81 MG tablet Take 81 mg by mouth daily.      . carvedilol (COREG) 12.5 MG tablet TAKE 1 TABLET (12.5 MG TOTAL) BY MOUTH 2 (TWO) TIMES DAILY WITH A MEAL. 180 tablet 3  . gabapentin (NEURONTIN) 300 MG capsule Take 3 capsules (900 mg total) by mouth daily. 90 capsule 11  . lisinopril (PRINIVIL,ZESTRIL) 20 MG tablet TAKE 1 TABLET EVERY DAY 90 tablet 3  . metFORMIN (GLUCOPHAGE) 500 MG tablet Take 500 mg by mouth 2 (two) times daily with a meal.      . NON FORMULARY legatrin pm Daily      . Omega-3 Fatty Acids (FISH OIL) 1000 MG CAPS Take 1,000 mg by mouth 2 (two) times daily.    . Potassium 99 MG TABS Take 99 mg by mouth 2 (two) times daily. Take 2 tabs am, and 2 tabs pm    . PROAIR HFA 108 (90 BASE) MCG/ACT inhaler Inhale 2 puffs into the lungs 4 (four) times daily.     . simvastatin (ZOCOR) 20 MG tablet Take 1 tablet (20 mg total) by mouth at bedtime. 90 tablet 3  . warfarin (COUMADIN) 5 MG tablet TAKE AS DIRECTED  BY  COUMADIN  CLINIC  LISA  REID  RN 90 tablet 3  . furosemide (LASIX) 40 MG tablet Take 1 tablet (40 mg total) by mouth 2 (two) times daily. 180 tablet 3   No current facility-administered medications for this  visit.    Allergies:  Patient has no known allergies.   Social History: The patient  reports that he quit smoking about 13 years ago. His smoking use included Cigarettes. He started smoking about 49 years ago. He has never used smokeless tobacco. He reports that he does not drink alcohol or use drugs.   ROS:  Please see the history of present illness. Otherwise, complete review of systems is positive for none.  All other systems are reviewed and negative.   Physical Exam: VS:  BP (!) 142/78   Pulse 73   Ht 6' (1.829 m)   Wt 203 lb (92.1 kg)   SpO2 93%   BMI 27.53 kg/m , BMI Body mass index is 27.53 kg/m.  Wt Readings from Last 3 Encounters:  09/10/16 203 lb (92.1 kg)  03/26/16 195 lb (88.5 kg)  09/22/15 192 lb (87.1 kg)    General:  Overweight male, appears comfortable at rest. HEENT: Conjunctiva and lids normal, oropharynx clear. Neck: Supple, elevated JVP, no carotid bruits, no thyromegaly. Lungs: Diminished breath sounds, nonlabored breathing at rest. Cardiac: Regular rate and rhythm, no S3, 2/6 systolic murmur, no pericardial rub. Abdomen: Soft, nontender, bowel sounds present, no guarding or rebound. Extremities: 1+ lower leg edema, distal pulses 2+. Skin: Warm and dry. Musculoskeletal: No kyphosis. Neuropsychiatric: Alert and oriented x3, affect grossly appropriate.  ECG: I personally reviewed the tracing from 03/26/2016 which showed sinus rhythm with old inferior and anterolateral infarct pattern.  Recent Labwork:  October 2016: Potassium 4.2, BUN 17, creatinine 1.2, AST 17, ALT 15, hemoglobin 12.9, platelets 201, cholesterol 119, triglycerides 114, HDL 28, LDL 68, hemoglobin A1c 6.5  Other Studies Reviewed Today:  Echocardiogram 04/10/2016: Study Conclusions  - Left ventricle: The cavity size was mildly dilated. Wall   thickness was increased in a pattern of moderate LVH. Systolic   function was severely reduced. The estimated ejection fraction   was in the range of 25% to 30%. Diffuse hypokinesis. Noncompacted   appearance of the apical and distal anterolateral myocardium.   Doppler parameters are consistent with abnormal left ventricular   relaxation (grade 1 diastolic dysfunction). - Regional wall motion abnormality: Akinesis of the apical   anterior, apical lateral, and apical myocardium; severe   hypokinesis of the mid anterolateral myocardium. - Aortic valve: Trileaflet; mildly thickened leaflets. - Aorta: Mild aortic root dilatation. Aortic root dimension: 42 mm   (ED). - Mitral valve: Mildly thickened leaflets . There was mild   regurgitation. - Left atrium: The atrium was mildly dilated. - Right ventricle: Systolic function was mildly reduced. - Atrial septum: No defect or patent foramen  ovale was identified. - Tricuspid valve: Mildly thickened leaflets.  Assessment and Plan:  1. Acute on chronic combined heart failure with evidence of volume overload compared to last visit. It sounds like this has been a very gradual process. He has not responded to the current dose of Lasix which will be increased to 40 mg twice daily with an increase in potassium supplementation as well. Follow-up BMET in 1 week with clinical visit to see how he is doing.  2. Ischemic cardiomyopathy with LVEF 25-30% by follow-up echocardiogram. He has preferred conservative approach overall without invasive testing. We will continue with medication adjustments as able.  3. Hyperlipidemia, continues on Zocor. Last LDL was 68.  4. History of PFO, inconsistently visualized by echocardiography. He does have a prior history of stroke and remains on Coumadin as well as aspirin.  Current medicines were reviewed  with the patient today.   Orders Placed This Encounter  Procedures  . Basic Metabolic Panel (BMET)    Disposition: Follow-up with BMET in 1 week.  Signed, Satira Sark, MD, [facility] 09/10/2016 8:21 AM    [facility] [address], [place], [place] [address] Phone: [phone]; Fax: [phone]

## 2016-09-10 ENCOUNTER — Encounter: Payer: Self-pay | Admitting: Cardiology

## 2016-09-10 ENCOUNTER — Ambulatory Visit (INDEPENDENT_AMBULATORY_CARE_PROVIDER_SITE_OTHER): Payer: Medicare Other | Admitting: Cardiology

## 2016-09-10 VITALS — BP 142/78 | HR 73 | Ht 72.0 in | Wt 203.0 lb

## 2016-09-10 DIAGNOSIS — Z8673 Personal history of transient ischemic attack (TIA), and cerebral infarction without residual deficits: Secondary | ICD-10-CM

## 2016-09-10 DIAGNOSIS — I255 Ischemic cardiomyopathy: Secondary | ICD-10-CM | POA: Diagnosis not present

## 2016-09-10 DIAGNOSIS — I2589 Other forms of chronic ischemic heart disease: Secondary | ICD-10-CM

## 2016-09-10 DIAGNOSIS — I5043 Acute on chronic combined systolic (congestive) and diastolic (congestive) heart failure: Secondary | ICD-10-CM

## 2016-09-10 DIAGNOSIS — E782 Mixed hyperlipidemia: Secondary | ICD-10-CM | POA: Diagnosis not present

## 2016-09-10 DIAGNOSIS — I6350 Cerebral infarction due to unspecified occlusion or stenosis of unspecified cerebral artery: Secondary | ICD-10-CM

## 2016-09-10 MED ORDER — FUROSEMIDE 40 MG PO TABS: 40.0000 mg | ORAL_TABLET | Freq: Two times a day (BID) | ORAL | 3 refills | Status: DC

## 2016-09-10 NOTE — Patient Instructions (Signed)
Your physician recommends that you schedule a follow-up appointment in:  In 1 week  Get lab work the day before your visit : BMET    INCREASE Lasix to 40 mg twice a day  INCREASE Potassium to 2 tablets in the am, and 2 tablets in the pm          Thank you for choosing [facility] !

## 2016-09-10 NOTE — Addendum Note (Signed)
Addended by: Barbarann Ehlers A on: 09/10/2016 08:33 AM   Modules accepted: Orders

## 2016-09-13 ENCOUNTER — Telehealth: Payer: Self-pay

## 2016-09-13 ENCOUNTER — Other Ambulatory Visit (HOSPITAL_COMMUNITY)
Admission: RE | Admit: 2016-09-13 | Discharge: 2016-09-13 | Disposition: A | Discharge status: Home / Self Care | Payer: Medicare Other | Source: Ambulatory Visit | Attending: Cardiology | Admitting: Cardiology

## 2016-09-13 DIAGNOSIS — I255 Ischemic cardiomyopathy: Secondary | ICD-10-CM | POA: Insufficient documentation

## 2016-09-13 LAB — BASIC METABOLIC PANEL
Anion gap: 7 (ref 5–15)
BUN: 36 mg/dL — AB (ref 6–20)
CHLORIDE: 102 mmol/L (ref 101–111)
CO2: 28 mmol/L (ref 22–32)
CREATININE: 1.71 mg/dL — AB (ref 0.61–1.24)
Calcium: 9 mg/dL (ref 8.9–10.3)
GFR calc non Af Amer: 39 mL/min — ABNORMAL LOW (ref >60)
GFR, EST AFRICAN AMERICAN: 46 mL/min — AB (ref >60)
Glucose, Bld: 131 mg/dL — ABNORMAL HIGH (ref 65–99)
Potassium: 4.1 mmol/L (ref 3.5–5.1)
SODIUM: 137 mmol/L (ref 135–145)

## 2016-09-13 NOTE — Telephone Encounter (Signed)
-----   Message from Satira Sark, MD sent at 09/13/2016  9:57 AM EST ----- Results reviewed. Creatinine has bumped up from 1.1-1.7 after doubling of Lasix dose. Please check to see how he is doing in terms of fluid loss and weight change. Anticipate cutting the Lasix back to 40 mg in the morning and 20 mg in the evening. A copy of this test should be forwarded to Wende Neighbors, MD.

## 2016-09-13 NOTE — Telephone Encounter (Signed)
Patient states that he does not have any swelling in ankles, and he does not have a scale and does not weigh self. I relayed the results of his labs to him.He has apt on Monday 09/16/16 with K.Lawrence NP.He will reduce Lasix to 40 mg am  And 20 mg pm until he sees provider on Monday and will be re-evaluated

## 2016-09-15 NOTE — Progress Notes (Signed)
Cardiology Office Note   Date:  09/16/2016   ID:  Dennis Yu, [place] August 28, 1945, MRN [id number]  PCP:  Wende Neighbors, MD  Cardiologist: [place]/  Jory Sims, NP   Chief Complaint  Patient presents with  . Congestive Heart Failure     History of Present Illness: SKYLOR Yu is a 71 y.o. male who presents for of chronic  Combined CHF ischemic cardiomyopathy, LVEF of 25-30%, hyperlipidemia, story of PFO inconsistently visualized by echocardiogram  remains on Coumadin and aspirin. The patient was last seen by Dr. Domenic Polite found to have evidence of overload Lasix was increased  40 mg twice day with increased potassium supplementation..  The patient has diureses with increased dose of Lasix, but follow-up labs revealed elevated creatinine of 1.7. He was advised to cut back on his Lasix to 40 mg in the morning and 20 mg in the afternoon. The patient states he is feeling better has less edema and is without further complaint.     Past Medical History:  Diagnosis Date  . Bronchitis 11/2012  . Cardiomyopathy    LVEF 25%  . Diabetes mellitus   . Erectile dysfunction   . Essential hypertension, benign   . GERD (gastroesophageal reflux disease)   . History of colonic polyps   . Mixed hyperlipidemia   . 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 Obstructive sleep apnea   . Patent foramen ovale   . Phlebitis   . 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 Superficial phlebitis 12/10/2012  . Type 2 diabetes mellitus ([place])   . Vitamin D deficiency     History reviewed. No pertinent surgical history.   Current Outpatient Prescriptions  Medication Sig Dispense Refill  . aspirin 81 MG tablet Take 81 mg by mouth daily.      . carvedilol (COREG) 12.5 MG tablet TAKE 1 TABLET (12.5 MG TOTAL) BY MOUTH 2 (TWO) TIMES DAILY WITH A MEAL. 180 tablet 3  . gabapentin (NEURONTIN) 300 MG capsule Take 3 capsules (900 mg total) by mouth daily. 90 capsule 11  . lisinopril (PRINIVIL,ZESTRIL)  20 MG tablet TAKE 1 TABLET EVERY DAY 90 tablet 3  . metFORMIN (GLUCOPHAGE) 500 MG tablet Take 500 mg by mouth 2 (two) times daily with a meal.      . NON FORMULARY legatrin pm Daily      . Omega-3 Fatty Acids (FISH OIL) 1000 MG CAPS Take 1,000 mg by mouth 2 (two) times daily.    . Potassium 99 MG TABS Take 99 mg by mouth 2 (two) times daily. Take 2 tabs am, and 2 tabs pm    . PROAIR HFA 108 (90 BASE) MCG/ACT inhaler Inhale 2 puffs into the lungs 4 (four) times daily.     . simvastatin (ZOCOR) 20 MG tablet Take 1 tablet (20 mg total) by mouth at bedtime. 90 tablet 3  . warfarin (COUMADIN) 5 MG tablet TAKE AS DIRECTED  BY  COUMADIN  CLINIC  LISA  REID  RN 90 tablet 3  . furosemide (LASIX) 40 MG tablet Take 40 mg ( 1 Tablet)  in the AM and Take 20 mg(1/2 Tablet)  in the PM 135 tablet 3   No current facility-administered medications for this visit.     Allergies:   Patient has no known allergies.    Social History:  The patient  reports that he quit smoking about 13 years ago. His smoking use included Cigarettes. He started smoking about  49 years ago. He has never used smokeless tobacco. He reports that he does not drink alcohol or use drugs.   Family History:  The patient's family history includes Diabetes in his father.    ROS: All other systems are reviewed and negative. Unless otherwise mentioned in H&P    PHYSICAL EXAM: VS:  BP 116/60   Pulse 70   Ht 6' (1.829 m)   Wt 201 lb (91.2 kg)   SpO2 95%   BMI 27.26 kg/m  , BMI Body mass index is 27.26 kg/m. GEN: Well nourished, well developed, in no acute distress  HEENT: normal  Neck: no JVD, carotid bruits, or masses Cardiac: RRR; no murmurs, rubs, or gallops,no edema  Respiratory:  clear to auscultation bilaterally, normal work of breathing GI: soft, nontender, nondistended, + BS MS: no deformity or atrophy  Skin: warm and dry, no rash Neuro:  Strength and sensation are intact Psych: euthymic mood, full affect   Recent  Labs: 09/13/2016: BUN 36; Creatinine, Ser 1.71; Potassium 4.1; Sodium 137    Lipid Panel    Component Value Date/Time   CHOL 145 04/05/2011 0545   TRIG 66 04/05/2011 0545   HDL 40 04/05/2011 0545   CHOLHDL 3.6 04/05/2011 0545   VLDL 13 04/05/2011 0545   LDLCALC 92 04/05/2011 0545      Wt Readings from Last 3 Encounters:  09/16/16 201 lb (91.2 kg)  09/10/16 203 lb (92.1 kg)  03/26/16 195 lb (88.5 kg)      Other studies Reviewed: [id number] Left ventricle: The cavity size was mildly dilated. Wall   thickness was increased in a pattern of moderate LVH. Systolic   function was severely reduced. The estimated ejection fraction   was in the range of 25% to 30%. Diffuse hypokinesis. Noncompacted   appearance of the apical and distal anterolateral myocardium.   Doppler parameters are consistent with abnormal left ventricular   relaxation (grade 1 diastolic dysfunction). - Regional wall motion abnormality: Akinesis of the apical   anterior, apical lateral, and apical myocardium; severe   hypokinesis of the mid anterolateral myocardium. - Aortic valve: Trileaflet; mildly thickened leaflets. - Aorta: Mild aortic root dilatation. Aortic root dimension: 42 mm   (ED). - Mitral valve: Mildly thickened leaflets . There was mild   regurgitation. - Left atrium: The atrium was mildly dilated. - Right ventricle: Systolic function was mildly reduced. - Atrial septum: No defect or patent foramen ovale was identified. - Tricuspid valve: Mildly thickened leaflets.  ASSESSMENT AND PLAN:  1. Chronic combined heart failure: The patient has had improvement of edema, his weight is down 4 pounds, blood pressure is well-controlled. He states that that his weight is down more so at home. He is now taking 40 mg of Lasix in the morning and 20 mg of Lasix in the afternoon. He is doing his best to watch his salt intake. We'll continue him on his current regimen for now and repeat his BMET. The patient will  follow-up with Dr. Domenic Polite or our practice in 3 months.  2. Ischemic cardiomyopathy: Most recent echocardiogram revealed an EF of 25-30%. The patient has preferred not to proceed with any invasive procedures such as ICD, or cardiac catheterization. The patient will continue on his current medication regimen as his heart rate and blood pressure much better controlled. Would like to follow-up with an echocardiogram at some point to evaluate his response to treatment. For now we'll continue him on current medication regimen and not plan any testing  at this time.  3. History of PFO: The patient continues on Coumadin therapy. He is to see Christella Noa RN in our office today for management.   Current medicines are reviewed at length with the patient today.    Labs/ tests ordered today include: BMET   Orders Placed This Encounter  Procedures  . Basic Metabolic Panel (BMET)     Disposition:   FU with  3 months.  Signed, Jory Sims, NP  09/16/2016 3:40 PM    [facility] [address], [place], [place] [address] Phone: [phone]; Fax: [phone]

## 2016-09-16 ENCOUNTER — Ambulatory Visit (INDEPENDENT_AMBULATORY_CARE_PROVIDER_SITE_OTHER): Payer: Medicare Other | Admitting: Adult Health

## 2016-09-16 ENCOUNTER — Encounter: Payer: Self-pay | Admitting: Adult Health

## 2016-09-16 ENCOUNTER — Ambulatory Visit (INDEPENDENT_AMBULATORY_CARE_PROVIDER_SITE_OTHER): Payer: Medicare Other | Admitting: *Deleted

## 2016-09-16 VITALS — BP 116/60 | HR 70 | Ht 72.0 in | Wt 201.0 lb

## 2016-09-16 DIAGNOSIS — I6350 Cerebral infarction due to unspecified occlusion or stenosis of unspecified cerebral artery: Secondary | ICD-10-CM | POA: Diagnosis not present

## 2016-09-16 DIAGNOSIS — Z5181 Encounter for therapeutic drug level monitoring: Secondary | ICD-10-CM

## 2016-09-16 DIAGNOSIS — Z79899 Other long term (current) drug therapy: Secondary | ICD-10-CM

## 2016-09-16 DIAGNOSIS — Z7901 Long term (current) use of anticoagulants: Secondary | ICD-10-CM

## 2016-09-16 DIAGNOSIS — I639 Cerebral infarction, unspecified: Secondary | ICD-10-CM

## 2016-09-16 DIAGNOSIS — I255 Ischemic cardiomyopathy: Secondary | ICD-10-CM | POA: Diagnosis not present

## 2016-09-16 DIAGNOSIS — Z86718 Personal history of other venous thrombosis and embolism: Secondary | ICD-10-CM

## 2016-09-16 DIAGNOSIS — I5043 Acute on chronic combined systolic (congestive) and diastolic (congestive) heart failure: Secondary | ICD-10-CM | POA: Diagnosis not present

## 2016-09-16 LAB — POCT INR: INR: 2.5

## 2016-09-16 MED ORDER — FUROSEMIDE 40 MG PO TABS: ORAL_TABLET | ORAL | 3 refills | Status: DC

## 2016-09-16 MED ORDER — FUROSEMIDE 40 MG PO TABS: ORAL_TABLET | ORAL | 6 refills | Status: DC

## 2016-09-16 NOTE — Progress Notes (Deleted)
Name: Dennis Yu    DOB: 14-Nov-1945  Age: 71 y.o.  MR#: [id number]       PCP:  Wende Neighbors, MD      Insurance: Payor: MEDICARE / Plan: MEDICARE PART A AND B / Product Type: *No Product type* /   CC:   No chief complaint on file.   VS Vitals:   09/16/16 1447  BP: 116/60  Pulse: 70  SpO2: 95%  Weight: 201 lb (91.2 kg)  Height: 6' (1.829 m)    Weights Current Weight  09/16/16 201 lb (91.2 kg)  09/10/16 203 lb (92.1 kg)  03/26/16 195 lb (88.5 kg)    Blood Pressure  BP Readings from Last 3 Encounters:  09/16/16 116/60  09/10/16 (!) 142/78  03/26/16 124/64     Admit date:  (Not on file) Last encounter with RMR:  Visit date not found   Allergy Patient has no known allergies.  Current Outpatient Prescriptions  Medication Sig Dispense Refill  . aspirin 81 MG tablet Take 81 mg by mouth daily.      . carvedilol (COREG) 12.5 MG tablet TAKE 1 TABLET (12.5 MG TOTAL) BY MOUTH 2 (TWO) TIMES DAILY WITH A MEAL. 180 tablet 3  . furosemide (LASIX) 40 MG tablet Take 1 tablet (40 mg total) by mouth 2 (two) times daily. 180 tablet 3  . gabapentin (NEURONTIN) 300 MG capsule Take 3 capsules (900 mg total) by mouth daily. 90 capsule 11  . lisinopril (PRINIVIL,ZESTRIL) 20 MG tablet TAKE 1 TABLET EVERY DAY 90 tablet 3  . metFORMIN (GLUCOPHAGE) 500 MG tablet Take 500 mg by mouth 2 (two) times daily with a meal.      . NON FORMULARY legatrin pm Daily      . Omega-3 Fatty Acids (FISH OIL) 1000 MG CAPS Take 1,000 mg by mouth 2 (two) times daily.    . Potassium 99 MG TABS Take 99 mg by mouth 2 (two) times daily. Take 2 tabs am, and 2 tabs pm    . PROAIR HFA 108 (90 BASE) MCG/ACT inhaler Inhale 2 puffs into the lungs 4 (four) times daily.     . simvastatin (ZOCOR) 20 MG tablet Take 1 tablet (20 mg total) by mouth at bedtime. 90 tablet 3  . warfarin (COUMADIN) 5 MG tablet TAKE AS DIRECTED  BY  COUMADIN  CLINIC  LISA  REID  RN 90 tablet 3   No current facility-administered medications for this  visit.     Discontinued Meds:   There are no discontinued medications.  Patient Active Problem List   Diagnosis Date Noted  . Encounter for therapeutic drug monitoring 09/02/2013  . Superficial phlebitis 12/10/2012  . 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 Long term (current) use of anticoagulants 05/27/2011  . Embolic stroke ([place]) [id number]  . Patent foramen ovale 05/27/2011  . Coronary atherosclerosis of native coronary artery 05/27/2011  . Mural thrombus of left ventricle 05/27/2011  . DIABETES MELLITUS, TYPE II, CONTROLLED, W/NEURO COMPS 07/01/2006  . Mixed hyperlipidemia 07/01/2006  . Essential hypertension, benign 07/01/2006    LABS    Component Value Date/Time   NA 137 09/13/2016 0810   NA 139 12/19/2014 0904   NA 140 12/06/2013 0819   K 4.1 09/13/2016 0810   K 4.6 12/19/2014 0904   K 4.4 12/06/2013 0819   CL 102 09/13/2016 0810   CL 106 12/19/2014 0904   CL 104 12/06/2013 0819   CO2 28 09/13/2016 0810   CO2 26 12/19/2014 0904  CO2 24 12/06/2013 0819   GLUCOSE 131 (H) 09/13/2016 0810   GLUCOSE 116 (H) 12/19/2014 0904   GLUCOSE 114 (H) 12/06/2013 0819   BUN 36 (H) 09/13/2016 0810   BUN 13 12/19/2014 0904   BUN 20 12/06/2013 0819   CREATININE 1.71 (H) 09/13/2016 0810   CREATININE 1.11 12/19/2014 0904   CREATININE 1.13 12/06/2013 0819   CREATININE 1.04 02/10/2013 0825   CREATININE 1.22 01/25/2013 1010   CREATININE 1.09 11/11/2011 0825   CALCIUM 9.0 09/13/2016 0810   CALCIUM 9.2 12/19/2014 0904   CALCIUM 9.3 12/06/2013 0819   GFRNONAA 39 (L) 09/13/2016 0810   GFRNONAA >60 12/19/2014 0904   GFRNONAA 66 (L) 12/06/2013 0819   GFRAA 46 (L) 09/13/2016 0810   GFRAA >60 12/19/2014 0904   GFRAA 76 (L) 12/06/2013 0819   CMP     Component Value Date/Time   NA 137 09/13/2016 0810   K 4.1 09/13/2016 0810   CL 102 09/13/2016 0810   CO2 28 09/13/2016 0810   GLUCOSE 131 (H) 09/13/2016 0810   BUN 36 (H) 09/13/2016 0810   CREATININE 1.71 (H) 09/13/2016  0810   CREATININE 1.04 02/10/2013 0825   CALCIUM 9.0 09/13/2016 0810   PROT 7.5 12/19/2014 0904   ALBUMIN 4.2 12/19/2014 0904   AST 20 12/19/2014 0904   ALT 18 12/19/2014 0904   ALKPHOS 51 12/19/2014 0904   BILITOT 0.4 12/19/2014 0904   GFRNONAA 39 (L) 09/13/2016 0810   GFRAA 46 (L) 09/13/2016 0810       Component Value Date/Time   WBC 7.3 12/19/2014 0904   WBC 6.2 12/06/2013 0819   WBC 7.0 12/07/2012 0832   HGB 13.3 12/19/2014 0904   HGB 13.3 12/06/2013 0819   HGB 13.1 12/07/2012 0832   HCT 42.6 12/19/2014 0904   HCT 41.4 12/06/2013 0819   HCT 41.2 12/07/2012 0832   MCV 89.9 12/19/2014 0904   MCV 88.1 12/06/2013 0819   MCV 87.8 12/07/2012 0832    Lipid Panel     Component Value Date/Time   CHOL 145 04/05/2011 0545   TRIG 66 04/05/2011 0545   HDL 40 04/05/2011 0545   CHOLHDL 3.6 04/05/2011 0545   VLDL 13 04/05/2011 0545   LDLCALC 92 04/05/2011 0545    ABG No results found for: PHART, PCO2ART, PO2ART, HCO3, TCO2, ACIDBASEDEF, O2SAT   Lab Results  Component Value Date   TSH 1.136 ***Test methodology is 3rd generation TSH*** 01/29/2008   BNP (last 3 results) No results for input(s): BNP in the last 8760 hours.  ProBNP (last 3 results) No results for input(s): PROBNP in the last 8760 hours.  Cardiac Panel (last 3 results) No results for input(s): CKTOTAL, CKMB, TROPONINI, RELINDX in the last 72 hours.  Iron/TIBC/Ferritin/ %Sat No results found for: IRON, TIBC, FERRITIN, IRONPCTSAT   EKG Orders placed or performed in visit on 03/26/16  . EKG 12-Lead     Prior Assessment and Plan Problem List as of 09/16/2016 Reviewed: 12/21/2015  8:09 PM by Molli Hazard, MD     Cardiovascular and Mediastinum   Essential hypertension, benign   Last Assessment & Plan 08/17/2014 Office Visit Written 08/17/2014  8:35 AM by Satira Sark, MD    Blood pressure is elevated today. We reviewed his medications and I discussed his diet and sodium intake. Continue current  medications including Coreg and lisinopril. ACE inhibitor could be further advanced if needed. Keep follow-up with Dr. [place] Crane.      Embolic stroke ([place])  Last Assessment & Plan 08/02/2013 Office Visit Written 08/02/2013  8:34 AM by Satira Sark, MD    Previous history, symptomatically stable. He is on both aspirin and Coumadin, history of PFO and LV mural thrombus. He has declined invasive evaluations.      Patent foramen ovale   Last Assessment & Plan 01/17/2014 Office Visit Written 01/17/2014  8:37 AM by Satira Sark, MD    He continues on Coumadin, previous history of stroke. RV size and function normal.      Coronary atherosclerosis of native coronary artery   Last Assessment & Plan 01/17/2014 Office Visit Written 01/17/2014  8:36 AM by Satira Sark, MD    Symptomatically stable on medical therapy. His preference has been to hold off on further invasive evaluations.      Mural thrombus of left ventricle   Last Assessment & Plan 08/17/2014 Office Visit Written 08/17/2014  8:34 AM by Satira Sark, MD    We have continued chronic Coumadin with history of stroke and PFO as well. Last echocardiogram did not show any definitive evidence of residual thrombus but prominent trabeculation.      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 Patient prefers conservative approach, continue medical therapy. Fortunately, he has been clinically stable. We will follow-up with an echocardiogram for his next visit in 6 months.      Superficial phlebitis     Other   DIABETES MELLITUS, TYPE II, CONTROLLED, W/NEURO COMPS   Mixed hyperlipidemia   Last Assessment & Plan 01/01/2013 Office Visit Written 01/01/2013 10:02 AM by Satira Sark, MD    Continues on Zocor.      Long term (current) use of anticoagulants   Encounter for therapeutic drug  monitoring       Imaging: No results found.

## 2016-09-16 NOTE — Patient Instructions (Signed)
Your physician recommends that you schedule a follow-up appointment in: 3 Months with Dr. Domenic Polite  Your physician recommends that you continue on your current medications as directed. Please refer to the Current Medication list given to you today.  Take Lasix 40 mg in the AM and Take 20 mg in the PM   Your physician recommends that you return for lab work in: 1 Week   If you need a refill on your cardiac medications before your next appointment, please call your pharmacy.  Thank you for choosing [facility]!

## 2016-09-20 DIAGNOSIS — B351 Tinea unguium: Secondary | ICD-10-CM | POA: Diagnosis not present

## 2016-09-20 DIAGNOSIS — E1142 Type 2 diabetes mellitus with diabetic polyneuropathy: Secondary | ICD-10-CM | POA: Diagnosis not present

## 2016-09-23 ENCOUNTER — Other Ambulatory Visit (HOSPITAL_COMMUNITY)
Admission: RE | Admit: 2016-09-23 | Discharge: 2016-09-23 | Disposition: A | Discharge status: Home / Self Care | Payer: Medicare Other | Source: Ambulatory Visit | Attending: Adult Health | Admitting: Adult Health

## 2016-09-23 DIAGNOSIS — Z79899 Other long term (current) drug therapy: Secondary | ICD-10-CM | POA: Insufficient documentation

## 2016-09-23 LAB — BASIC METABOLIC PANEL
Anion gap: 6 (ref 5–15)
BUN: 18 mg/dL (ref 6–20)
CALCIUM: 8.9 mg/dL (ref 8.9–10.3)
CO2: 26 mmol/L (ref 22–32)
Chloride: 105 mmol/L (ref 101–111)
Creatinine, Ser: 1.22 mg/dL (ref 0.61–1.24)
GFR calc Af Amer: >60 mL/min (ref >60)
GFR calc non Af Amer: 59 mL/min — ABNORMAL LOW (ref >60)
GLUCOSE: 124 mg/dL — AB (ref 65–99)
Potassium: 4.6 mmol/L (ref 3.5–5.1)
Sodium: 137 mmol/L (ref 135–145)

## 2016-10-03 ENCOUNTER — Other Ambulatory Visit: Payer: Self-pay | Admitting: Adult Health

## 2016-10-03 MED ORDER — FUROSEMIDE 40 MG PO TABS: ORAL_TABLET | ORAL | 3 refills | Status: DC

## 2016-10-03 NOTE — Telephone Encounter (Signed)
Refill sent to [facility] for lasix 40 mg am and 20 mg pm

## 2016-10-03 NOTE — Telephone Encounter (Signed)
Pt is needing a new Rx sent in to [facility] for his furosemide (LASIX) 40 MG tablet [id number]

## 2016-10-28 ENCOUNTER — Other Ambulatory Visit: Payer: Self-pay | Admitting: Cardiology

## 2016-10-28 ENCOUNTER — Ambulatory Visit (INDEPENDENT_AMBULATORY_CARE_PROVIDER_SITE_OTHER): Payer: Medicare Other | Admitting: *Deleted

## 2016-10-28 DIAGNOSIS — Z86718 Personal history of other venous thrombosis and embolism: Secondary | ICD-10-CM

## 2016-10-28 DIAGNOSIS — Z7901 Long term (current) use of anticoagulants: Secondary | ICD-10-CM

## 2016-10-28 DIAGNOSIS — I6350 Cerebral infarction due to unspecified occlusion or stenosis of unspecified cerebral artery: Secondary | ICD-10-CM | POA: Diagnosis not present

## 2016-10-28 DIAGNOSIS — I639 Cerebral infarction, unspecified: Secondary | ICD-10-CM | POA: Diagnosis not present

## 2016-10-28 DIAGNOSIS — Z5181 Encounter for therapeutic drug level monitoring: Secondary | ICD-10-CM

## 2016-10-28 LAB — POCT INR: INR: 2.2

## 2016-11-29 DIAGNOSIS — B351 Tinea unguium: Secondary | ICD-10-CM | POA: Diagnosis not present

## 2016-11-29 DIAGNOSIS — E1142 Type 2 diabetes mellitus with diabetic polyneuropathy: Secondary | ICD-10-CM | POA: Diagnosis not present

## 2016-12-06 NOTE — Progress Notes (Signed)
Cardiology Office Note  Date: 12/09/2016   ID: Dennis Yu, DOB 22-May-1946, MRN [id number]  PCP: [name], MD  Primary Cardiologist: Rozann Lesches, MD   Chief Complaint  Patient presents with  . Cardiomyopathy    History of Present Illness: Dennis Yu is a 71 y.o. male last seen by Ms. Lawrence NP in February. He presents for a routine follow-up visit. He is satisfied with control of leg edema, this has not been a major recurring issue on current dose of Lasix. This did require up titration within the last 6 months. His weight is down a few pounds. He has stable NYHA class 2-3 dyspnea, no exertional chest pain, palpitations, or syncope.  I reviewed his medications. Current cardiac regimen includes Coumadin, Coreg, Lasix, lisinopril, potassium supplements, and Zocor. I did talk with him today about considering a switch from lisinopril to [place], but he did not want to make any changes at this time.  Patient prefers conservative overall management without cardiac catheterization. Prior Cardiolite study from 2012 demonstrated area of dense scar in the inferoposterior wall as well as anteroseptal distribution. He did have mild to moderate peri-infarct ischemia in the periapical distribution.  He also reports a long-standing history of sleep apnea, was not able to tolerate CPAP previously. He plans to discuss a follow-up sleep study with his PCP.  Past Medical History:  Diagnosis Date  . Bronchitis 11/2012  . Cardiomyopathy    LVEF 25%  . Diabetes mellitus   . Erectile dysfunction   . Essential hypertension, benign   . GERD (gastroesophageal reflux disease)   . History of colonic polyps   . Mixed hyperlipidemia   . 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 Obstructive sleep apnea   . Patent foramen ovale   . Phlebitis   . 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 Superficial phlebitis 12/10/2012  . Type 2 diabetes mellitus ([place])   . Vitamin D deficiency      History reviewed. No pertinent surgical history.  Current Outpatient Prescriptions  Medication Sig Dispense Refill  . aspirin 81 MG tablet Take 81 mg by mouth daily.      . carvedilol (COREG) 12.5 MG tablet TAKE 1 TABLET (12.5 MG TOTAL) BY MOUTH 2 (TWO) TIMES DAILY WITH A MEAL. 180 tablet 3  . furosemide (LASIX) 40 MG tablet Take 40 mg ( 1 Tablet)  in the AM and Take 20 mg(1/2 Tablet)  in the PM 135 tablet 3  . gabapentin (NEURONTIN) 300 MG capsule Take 3 capsules (900 mg total) by mouth daily. 90 capsule 11  . lisinopril (PRINIVIL,ZESTRIL) 20 MG tablet TAKE 1 TABLET EVERY DAY 90 tablet 3  . metFORMIN (GLUCOPHAGE) 500 MG tablet Take 500 mg by mouth 2 (two) times daily with a meal.      . NON FORMULARY legatrin pm Daily      . Omega-3 Fatty Acids (FISH OIL) 1000 MG CAPS Take 1,000 mg by mouth 2 (two) times daily.    . Potassium 99 MG TABS Take 99 mg by mouth 2 (two) times daily. Take 2 tabs am, and 2 tabs pm    . PROAIR HFA 108 (90 BASE) MCG/ACT inhaler Inhale 2 puffs into the lungs 4 (four) times daily.     . simvastatin (ZOCOR) 20 MG tablet TAKE 1 TABLET (20 MG TOTAL) BY MOUTH AT BEDTIME. 90 tablet 3  . warfarin (COUMADIN) 5 MG tablet TAKE AS DIRECTED  BY  COUMADIN  CLINIC  LISA  REID  RN 90 tablet 3   No current facility-administered medications for this visit.    Allergies:  Patient has no known allergies.   Social History: The patient  reports that he quit smoking about 13 years ago. His smoking use included Cigarettes. He started smoking about 49 years ago. He has never used smokeless tobacco. He reports that he does not drink alcohol or use drugs.   ROS:  Please see the history of present illness. Otherwise, complete review of systems is positive for arthritis pain.  All other systems are reviewed and negative.   Physical Exam: VS:  BP 116/74   Pulse 64   Ht 6' (1.829 m)   Wt 199 lb (90.3 kg)   SpO2 96%   BMI 26.99 kg/m , BMI Body mass index is 26.99 kg/m.  Wt  Readings from Last 3 Encounters:  12/09/16 199 lb (90.3 kg)  09/16/16 201 lb (91.2 kg)  09/10/16 203 lb (92.1 kg)    General: Overweight male,appears comfortable at rest. Using a cane to walk. HEENT: Conjunctiva and lids normal, oropharynx clear. Neck: Supple, elevated JVP, no carotid bruits, no thyromegaly. Lungs: Diminished breath sounds, nonlabored breathing at rest. Cardiac: Regular rate and rhythm, no S3, 2/6systolic murmur, no pericardial rub. Abdomen: Soft, nontender, bowel sounds present, no guarding or rebound. Extremities: No significant pittingedema, distal pulses 2+. Skin: Warm and dry. Musculoskeletal: No kyphosis. Neuropsychiatric: Alert and oriented x3, affect grossly appropriate.  ECG: I personally reviewed the tracing from 03/26/2016 which showed sinus rhythm with old anterior and inferolateral infarct pattern, nonspecific ST-T changes.  Recent Labwork: 09/23/2016: BUN 18; Creatinine, Ser 1.22; Potassium 4.6; Sodium 137   Other Studies Reviewed Today:  Echocardiogram 04/10/2016: Study Conclusions  - Left ventricle: The cavity size was mildly dilated. Wall thickness was increased in a pattern of moderate LVH. Systolic function was severely reduced. The estimated ejection fraction was in the range of 25% to 30%. Diffuse hypokinesis. Noncompacted appearance of the apical and distal anterolateral myocardium. Doppler parameters are consistent with abnormal left ventricular relaxation (grade 1 diastolic dysfunction). - Regional wall motion abnormality: Akinesis of the apical anterior, apical lateral, and apical myocardium; severe hypokinesis of the mid anterolateral myocardium. - Aortic valve: Trileaflet; mildly thickened leaflets. - Aorta: Mild aortic root dilatation. Aortic root dimension: 42 mm (ED). - Mitral valve: Mildly thickened leaflets . There was mild regurgitation. - Left atrium: The atrium was mildly dilated. - Right ventricle:  Systolic function was mildly reduced. - Atrial septum: No defect or patent foramen ovale was identified. - Tricuspid valve: Mildly thickened leaflets.  Assessment and Plan:  1. Chronic combined heart failure, clinically stable at this time on current diuretic dose. We will continue with the current regimen. I talked with him about considering a switch from lisinopril to [facility], but he wanted to hold the course for now.  2. Ischemic cardiomyopathy with LVEF 25-30%. He does not one to pursue invasive assessment and we continue with medical therapy adjustments. No palpitations or syncope.  3. Hyperlipidemia, on Zocor. He follows with Dr. Nevada Crane.  4. Prior history of embolic stroke with inconsistent documentation of PFO. He continues on both aspirin and Coumadin.  Current medicines were reviewed with the patient today.  Disposition: Follow-up in 4 months.  Signed, Satira Sark, MD, [facility] 12/09/2016 8:07 AM    [facility] [place]. [address], [place], [place] [address] Phone: [phone]; Fax: [phone]  [phone] 

## 2016-12-09 ENCOUNTER — Ambulatory Visit (INDEPENDENT_AMBULATORY_CARE_PROVIDER_SITE_OTHER): Payer: Medicare Other | Admitting: *Deleted

## 2016-12-09 ENCOUNTER — Encounter: Payer: Self-pay | Admitting: Cardiology

## 2016-12-09 ENCOUNTER — Ambulatory Visit (INDEPENDENT_AMBULATORY_CARE_PROVIDER_SITE_OTHER): Payer: Medicare Other | Admitting: Cardiology

## 2016-12-09 VITALS — BP 116/74 | HR 64 | Ht 72.0 in | Wt 199.0 lb

## 2016-12-09 DIAGNOSIS — I2589 Other forms of chronic ischemic heart disease: Secondary | ICD-10-CM

## 2016-12-09 DIAGNOSIS — I5042 Chronic combined systolic (congestive) and diastolic (congestive) heart failure: Secondary | ICD-10-CM

## 2016-12-09 DIAGNOSIS — I639 Cerebral infarction, unspecified: Secondary | ICD-10-CM | POA: Diagnosis not present

## 2016-12-09 DIAGNOSIS — Z7901 Long term (current) use of anticoagulants: Secondary | ICD-10-CM | POA: Diagnosis not present

## 2016-12-09 DIAGNOSIS — Z8673 Personal history of transient ischemic attack (TIA), and cerebral infarction without residual deficits: Secondary | ICD-10-CM | POA: Diagnosis not present

## 2016-12-09 DIAGNOSIS — I255 Ischemic cardiomyopathy: Secondary | ICD-10-CM

## 2016-12-09 DIAGNOSIS — E782 Mixed hyperlipidemia: Secondary | ICD-10-CM | POA: Diagnosis not present

## 2016-12-09 DIAGNOSIS — Z86718 Personal history of other venous thrombosis and embolism: Secondary | ICD-10-CM

## 2016-12-09 DIAGNOSIS — I6350 Cerebral infarction due to unspecified occlusion or stenosis of unspecified cerebral artery: Secondary | ICD-10-CM

## 2016-12-09 DIAGNOSIS — Z5181 Encounter for therapeutic drug level monitoring: Secondary | ICD-10-CM

## 2016-12-09 LAB — POCT INR: INR: 2.4

## 2016-12-09 NOTE — Patient Instructions (Signed)
Your physician recommends that you schedule a follow-up appointment in: 4 months    Your physician recommends that you continue on your current medications as directed. Please refer to the Current Medication list given to you today.    If you need a refill on your cardiac medications before your next appointment, please call your pharmacy.     Thank you for choosing [facility] !

## 2016-12-16 DIAGNOSIS — E785 Hyperlipidemia, unspecified: Secondary | ICD-10-CM | POA: Diagnosis not present

## 2016-12-16 DIAGNOSIS — Z125 Encounter for screening for malignant neoplasm of prostate: Secondary | ICD-10-CM | POA: Diagnosis not present

## 2016-12-16 DIAGNOSIS — E119 Type 2 diabetes mellitus without complications: Secondary | ICD-10-CM | POA: Diagnosis not present

## 2016-12-18 DIAGNOSIS — I482 Chronic atrial fibrillation: Secondary | ICD-10-CM | POA: Diagnosis not present

## 2016-12-18 DIAGNOSIS — Z6828 Body mass index (BMI) 28.0-28.9, adult: Secondary | ICD-10-CM | POA: Diagnosis not present

## 2016-12-18 DIAGNOSIS — I639 Cerebral infarction, unspecified: Secondary | ICD-10-CM | POA: Diagnosis not present

## 2016-12-18 DIAGNOSIS — E785 Hyperlipidemia, unspecified: Secondary | ICD-10-CM | POA: Diagnosis not present

## 2016-12-18 DIAGNOSIS — Z0000 Encounter for general adult medical examination without abnormal findings: Secondary | ICD-10-CM | POA: Diagnosis not present

## 2016-12-18 DIAGNOSIS — I10 Essential (primary) hypertension: Secondary | ICD-10-CM | POA: Diagnosis not present

## 2016-12-18 DIAGNOSIS — E119 Type 2 diabetes mellitus without complications: Secondary | ICD-10-CM | POA: Diagnosis not present

## 2016-12-18 DIAGNOSIS — R944 Abnormal results of kidney function studies: Secondary | ICD-10-CM | POA: Diagnosis not present

## 2016-12-23 ENCOUNTER — Other Ambulatory Visit (HOSPITAL_BASED_OUTPATIENT_CLINIC_OR_DEPARTMENT_OTHER): Payer: Self-pay

## 2016-12-23 ENCOUNTER — Ambulatory Visit (HOSPITAL_COMMUNITY): Payer: Medicare Other

## 2016-12-23 DIAGNOSIS — G4730 Sleep apnea, unspecified: Secondary | ICD-10-CM

## 2016-12-24 ENCOUNTER — Ambulatory Visit (HOSPITAL_COMMUNITY): Payer: Medicare Other

## 2017-01-01 DIAGNOSIS — R944 Abnormal results of kidney function studies: Secondary | ICD-10-CM | POA: Diagnosis not present

## 2017-01-02 ENCOUNTER — Encounter: Payer: Self-pay | Admitting: *Deleted

## 2017-01-02 ENCOUNTER — Ambulatory Visit: Payer: Medicare Other | Attending: Internal Medicine | Admitting: Neurology

## 2017-01-02 DIAGNOSIS — G4733 Obstructive sleep apnea (adult) (pediatric): Secondary | ICD-10-CM | POA: Diagnosis not present

## 2017-01-02 DIAGNOSIS — G4730 Sleep apnea, unspecified: Secondary | ICD-10-CM | POA: Diagnosis not present

## 2017-01-12 NOTE — Procedures (Signed)
[place] A. Merlene Laughter, MD     [email or web address]             NOCTURNAL POLYSOMNOGRAPHY   LOCATION: [facility]  Patient Name: Dennis Yu, Dennis Yu Date: 01/02/2017 Gender: Male D.O.B: 09/08/1945 Age (years): 71 Referring Provider: Delphina Cahill Height (inches): 72 Interpreting Physician: Phillips Odor MD, ABSM Weight (lbs): 198 RPSGT: Peak, Robert BMI: 27 MRN: [id number] Neck Size: 17.00 CLINICAL INFORMATION The patient is referred for a split night study with BPAP. MEDICATIONS Medications self-administered by patient taken the night of the study : N/A  Current Outpatient Prescriptions:  .  aspirin 81 MG tablet, Take 81 mg by mouth daily.  , Disp: , Rfl:  .  carvedilol (COREG) 12.5 MG tablet, TAKE 1 TABLET (12.5 MG TOTAL) BY MOUTH 2 (TWO) TIMES DAILY WITH A MEAL., Disp: 180 tablet, Rfl: 3 .  furosemide (LASIX) 40 MG tablet, Take 40 mg ( 1 Tablet)  in the AM and Take 20 mg(1/2 Tablet)  in the PM, Disp: 135 tablet, Rfl: 3 .  gabapentin (NEURONTIN) 300 MG capsule, Take 3 capsules (900 mg total) by mouth daily., Disp: 90 capsule, Rfl: 11 .  lisinopril (PRINIVIL,ZESTRIL) 20 MG tablet, TAKE 1 TABLET EVERY DAY, Disp: 90 tablet, Rfl: 3 .  metFORMIN (GLUCOPHAGE) 500 MG tablet, Take 500 mg by mouth 2 (two) times daily with a meal.  , Disp: , Rfl:  .  NON FORMULARY, legatrin pm Daily  , Disp: , Rfl:  .  Omega-3 Fatty Acids (FISH OIL) 1000 MG CAPS, Take 1,000 mg by mouth 2 (two) times daily., Disp: , Rfl:  .  Potassium 99 MG TABS, Take 99 mg by mouth 2 (two) times daily. Take 2 tabs am, and 2 tabs pm, Disp: , Rfl:  .  PROAIR HFA 108 (90 BASE) MCG/ACT inhaler, Inhale 2 puffs into the lungs 4 (four) times daily. , Disp: , Rfl:  .  simvastatin (ZOCOR) 20 MG tablet, TAKE 1 TABLET (20 MG TOTAL) BY MOUTH AT BEDTIME., Disp: 90 tablet, Rfl: 3 .  warfarin (COUMADIN) 5 MG tablet, TAKE AS DIRECTED  BY  COUMADIN  CLINIC  LISA  REID  RN, Disp: 90 tablet, Rfl: 3   SLEEP STUDY  TECHNIQUE As per the AASM Manual for the Scoring of Sleep and Associated Events v2.3 (April 2016) with a hypopnea requiring 4% desaturations.  The channels recorded and monitored were frontal, central and occipital EEG, electrooculogram (EOG), submentalis EMG (chin), nasal and oral airflow, thoracic and abdominal wall motion, anterior tibialis EMG, snore microphone, electrocardiogram, and pulse oximetry. Bi-level positive airway pressure (BiPAP) was initiated when the patient met split night criteria and was titrated according to treat sleep-disordered breathing.  RESPIRATORY PARAMETERS Diagnostic  Total AHI (/hr): 27.4 RDI (/hr): 28.5 OA Index (/hr): 0.7 CA Index (/hr): 0.4 REM AHI (/hr): 20.0 NREM AHI (/hr): 28.1 Supine AHI (/hr): 50.9 Non-supine AHI (/hr): 26.26 Min O2 Sat (%): 77.00 Mean O2 (%): 86.40 Time below 88% (min): 133.4     Titration  The patient was titrated on CPAP between 5 to 14 but this did not eliminate apneic events. BiPAP was initiated at 12/8 and titrated to 16/12.   Optimal IPAP Pressure (cm): 15 Optimal EPAP Pressure (cm): 11 AHI at Optimal Pressure (/hr): 1.2 Min O2 at Optimal Pressure (%): 79.0 Sleep % at Optimal (%): 93 Supine % at Optimal (%): 77         SLEEP ARCHITECTURE The study was initiated at 9:51:32 PM and terminated at  4:09:54 AM. The total recorded time was 378.4 minutes. EEG confirmed total sleep time was 319.8 minutes yielding a sleep efficiency of 84.5%. Sleep onset after lights out was 8.1 minutes with a REM latency of 49.5 minutes. The patient spent 12.19% of the night in stage N1 sleep, 75.45% in stage N2 sleep, 0.00% in stage N3 and 12.35% in REM. Wake after sleep onset (WASO) was 50.5 minutes. The Arousal Index was 20.1/hour.  LEG MOVEMENT DATA The total Periodic Limb Movements of Sleep (PLMS) were 0. The PLMS index was 0.00 .  CARDIAC DATA The 2 lead EKG demonstrated sinus rhythm. The mean heart rate was 53.77 beats per minute. Other EKG findings  include: PVCs.    IMPRESSIONS - Moderate obstructive sleep apnea occurred during the diagnostic portion of the study (AHI = 27.4 /hour). Optimal BiPAP pressures of 15/11 are attained.  - Clinically significant periodic limb movements of sleep did not occur during the study.   Delano Metz, MD Diplomate, American Board of Sleep Medicine.  ELECTRONICALLY SIGNED ON:  01/12/2017, 8:28 AM [place] PH: [phone]   FX: [phone] [place]

## 2017-01-14 ENCOUNTER — Encounter (HOSPITAL_COMMUNITY): Payer: Medicare Other | Attending: Oncology | Admitting: Oncology

## 2017-01-14 ENCOUNTER — Encounter (HOSPITAL_COMMUNITY): Payer: Self-pay | Admitting: Oncology

## 2017-01-14 VITALS — BP 122/71 | HR 58 | Temp 97.8°F | Resp 18 | Ht 72.0 in | Wt 196.4 lb

## 2017-01-14 DIAGNOSIS — Z7901 Long term (current) use of anticoagulants: Secondary | ICD-10-CM | POA: Diagnosis not present

## 2017-01-14 DIAGNOSIS — I8001 Phlebitis and thrombophlebitis of superficial vessels of right lower extremity: Secondary | ICD-10-CM

## 2017-01-14 NOTE — Patient Instructions (Addendum)
[facility] at [facility] Discharge Instructions  RECOMMENDATIONS MADE BY THE CONSULTANT AND ANY TEST RESULTS WILL BE SENT TO YOUR REFERRING PHYSICIAN.  You were seen today by Dr. Choksi. Return in 1 year for labs and follow up.   Thank you for choosing  Cancer Center at [facility] to provide your oncology and hematology care.  To afford each patient quality time with our provider, please arrive at least 15 minutes before your scheduled appointment time.    If you have a lab appointment with the Cancer Center please come in thru the  Main Entrance and check in at the main information desk  You need to re-schedule your appointment should you arrive 10 or more minutes late.  We strive to give you quality time with our providers, and arriving late affects you and other patients whose appointments are after yours.  Also, if you no show three or more times for appointments you may be dismissed from the clinic at the providers discretion.     Again, thank you for choosing [facility].  Our hope is that these requests will decrease the amount of time that you wait before being seen by our physicians.       _____________________________________________________________  Should you have questions after your visit to  Cancer Center, please contact our office at [phone] between the hours of 8:30 a.m. and 4:30 p.m.  Voicemails left after 4:30 p.m. will not be returned until the following business day.  For prescription refill requests, have your pharmacy contact our office.       Resources For Cancer Patients and their Caregivers ? American Cancer Society: Can assist with transportation, wigs, general needs, runs Look Good Feel Better.        1-888-227-6333 ? Cancer Care: Provides financial assistance, online support groups, medication/co-pay assistance.  1-800-813-HOPE (4673) ? [facility] Assists  [place] cancer patients and their families through emotional , educational and financial support.  336-427-4357 ? [place] DSS Where to apply for food stamps, Medicaid and utility assistance. 336-342-1394 ? [facility]: Transportation to medical appointments. 336-347-2287 ? Social Security Administration: May apply for disability if have a Stage IV cancer. 336-342-7796 1-800-772-1213 ? [place] Aging, Disability and Transit Services: Assists with nutrition, care and transit needs. 336-349-2343  Cancer Center Support Programs: @[email or web address]@ > Cancer Support Group  2nd Tuesday of the month 1pm-2pm, Journey Room  > Creative Journey  3rd Tuesday of the month 1130am-1pm, Journey Room  > Look Good Feel Better  1st Wednesday of the month 10am-12 noon, Journey Room (Call American Cancer Society to register 1-800-395-5775)    

## 2017-01-14 NOTE — Progress Notes (Signed)
Dennis Squibb, MD [place] [address]   Superficial Phlebitis of the right leg History of CVA  CURRENT THERAPY: Observation   INTERVAL HISTORY: Dennis Yu 71 y.o. male returns for  regular  visit for followup of superficial phlebitis of his right leg presenting here for the first time on 11/06/2007. He had a 1 time extension from the upper thigh in the past all the way down to his lower leg. However, he is primarily had only the lower leg involvement. He also had venous stasis ulcers in the past but none recently. He is utilizing pressure stocking.  The patient is having his INR's monitored by his cardiologist.   He is still taking coumadin. Denies any bleeding or hematuria. His appetite is good. Bowels are good. Denies abdominal pain.   Reports his energy level is different, explaining he is not getting around as good - attributing this to the stroke he had 3 years ago. He reports his balance is not good. When he first had the stroke, he had therapy but has not been back since. Denies falling, stating the last time he fell was over a year ago. He uses a cane to keep his balance. Reports he has no coordination on his left side, though this is improved since when he first had the stroke. He does not get a lot of use out of his left side, though he has gotten used to it. States he would like to "hold off" on physical therapy for now.   Patient is here for further follow-up since last evaluation patient did not have any evidence of thrombosis. Does not smoke (quit smoking 15 years ago) Past Medical History:  Diagnosis Date  . Bronchitis 11/2012  . Cardiomyopathy    LVEF 25%  . Diabetes mellitus   . Erectile dysfunction   . Essential hypertension, benign   . GERD (gastroesophageal reflux disease)   . History of colonic polyps   . Mixed hyperlipidemia   . 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 Obstructive sleep apnea   . Patent foramen ovale   . Phlebitis     . Stroke [facility])    Embolic left temporal 5/99 s/p tPA  . Superficial phlebitis 12/10/2012  . Type 2 diabetes mellitus ([place])   . Vitamin D deficiency     has DIABETES MELLITUS, TYPE II, CONTROLLED, W/NEURO COMPS; Mixed hyperlipidemia; Essential hypertension, benign; Long term (current) use of anticoagulants; Embolic stroke ([place]); Patent foramen ovale; Coronary atherosclerosis of native coronary artery; Mural thrombus of left ventricle; Secondary cardiomyopathy ([place]); Superficial phlebitis; and Encounter for therapeutic drug monitoring on his problem list.     has No Known Allergies.  Dennis Yu had no medications administered during this visit.  History reviewed. No pertinent surgical history.   He lives with his wife and has been married 4-5 years.  He has children, 3 from his last wife, 3 from his first wife.  He has 9 grandchildren.  He has 6 brothers and 6 sisters, all with the same mother and father.   Denies any headaches, dizziness, double vision, fevers, chills, night sweats, nausea, vomiting, diarrhea, constipation, chest pain, heart palpitations, blood in stool, black tarry stool, urinary pain, urinary burning, urinary frequency, hematuria. Positive for balance problems since stroke. Positive for Shortness of Breath since stroke. Positive for focal weakness to the left side.  PHYSICAL EXAMINATION  ECOG PERFORMANCE STATUS: 1 - Symptomatic but completely ambulatory  Vitals:   01/14/17  1057  BP: 122/71  Pulse: (!) 58  Resp: 18  Temp: 97.8 F (36.6 C)    GENERAL:alert, no distress, well nourished, well developed, comfortable, cooperative and smiling     Visibly unsteady when getting up. SKIN: skin color, texture, turgor are normal, no rashes or significant lesions HEAD: Normocephalic, No masses, lesions, tenderness or abnormalities EYES: normal, PERRLA, EOMI, Conjunctiva are pink and non-injected EARS: External ears normal OROPHARYNX:mucous membranes are moist  NECK:  supple, no adenopathy, trachea midline LYMPH:  no palpable lymphadenopathy BREAST:not examined LUNGS: clear to auscultation and percussion HEART: regular rate & rhythm, no murmurs and no gallops ABDOMEN:abdomen soft and normal bowel sounds BACK: Back symmetric, no curvature. EXTREMITIES:less then 2 second capillary refill, no joint deformities, effusion, or inflammation, no skin discoloration, no clubbing, no cyanosis      Complains of focal weakness to the left side and balance issues. NEURO: alert & oriented x 3 with fluent speech, imbalance on standing but then little difficulty with ambulation   LABORATORY DATA: I have reviewed the data as listed. CBC    Component Value Date/Time   WBC 7.3 12/19/2014 0904   RBC 4.74 12/19/2014 0904   HGB 13.3 12/19/2014 0904   HCT 42.6 12/19/2014 0904   PLT 193 12/19/2014 0904   MCV 89.9 12/19/2014 0904   MCH 28.1 12/19/2014 0904   MCHC 31.2 12/19/2014 0904   RDW 12.6 12/19/2014 0904   LYMPHSABS 2.1 12/19/2014 0904   MONOABS 0.4 12/19/2014 0904   EOSABS 0.4 12/19/2014 0904   BASOSABS 0.0 12/19/2014 0904      Chemistry      Component Value Date/Time   NA 137 09/23/2016 0904   K 4.6 09/23/2016 0904   CL 105 09/23/2016 0904   CO2 26 09/23/2016 0904   BUN 18 09/23/2016 0904   CREATININE 1.22 09/23/2016 0904   CREATININE 1.04 02/10/2013 0825      Component Value Date/Time   CALCIUM 8.9 09/23/2016 0904   ALKPHOS 51 12/19/2014 0904   AST 20 12/19/2014 0904   ALT 18 12/19/2014 0904   BILITOT 0.4 12/19/2014 0904        ASSESSMENT:  1. Superficial phlebitis of his right leg presenting here for the first time on 11/06/2007. He had a 1 time extension from the upper thigh in the past all the way down to his lower leg. However, he is primarily had only the lower leg involvement. He also had venous stasis ulcers in the past but none recently.  2. Stroke affecting left-sided body in August 2012  3. History of smoking times many years, quit   4. Hypercholesterolemia  5. Sleep apnea syndrome.  6. 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 Diabetes mellitus times many years  8. HTN, followed by cardiology  9. CAD, followed by cardiology  10. Patent foramen ovale, followed by cardiology  11. Mural thrombus of left ventricle, followed by cardiology    Patient Active Problem List   Diagnosis Date Noted  . Encounter for therapeutic drug monitoring 09/02/2013  . Superficial phlebitis 12/10/2012  . Secondary cardiomyopathy ([place]) 09/03/2011  . Long term (current) use of anticoagulants 05/27/2011  . Embolic stroke ([place]) 87/56/4332  . Patent foramen ovale 05/27/2011  . Coronary atherosclerosis of native coronary artery 05/27/2011  . Mural thrombus of left ventricle 05/27/2011  . DIABETES MELLITUS, TYPE II, CONTROLLED, W/NEURO COMPS 07/01/2006  . Mixed hyperlipidemia 07/01/2006  . Essential hypertension, benign 07/01/2006  PLAN:  1. Recommend follow-up with PCP as directed 2. Follow-up with cardiology as directed 3. Patient wishes to continue annual follow-up.  We will see him in 1 year.   Patient desires continuing follow-up once a year

## 2017-01-22 ENCOUNTER — Encounter: Payer: Self-pay | Admitting: Cardiology

## 2017-01-22 ENCOUNTER — Ambulatory Visit (INDEPENDENT_AMBULATORY_CARE_PROVIDER_SITE_OTHER): Payer: Medicare Other | Admitting: *Deleted

## 2017-01-22 DIAGNOSIS — Z7901 Long term (current) use of anticoagulants: Secondary | ICD-10-CM | POA: Diagnosis not present

## 2017-01-22 DIAGNOSIS — I6350 Cerebral infarction due to unspecified occlusion or stenosis of unspecified cerebral artery: Secondary | ICD-10-CM | POA: Diagnosis not present

## 2017-01-22 DIAGNOSIS — I639 Cerebral infarction, unspecified: Secondary | ICD-10-CM

## 2017-01-22 DIAGNOSIS — Z86718 Personal history of other venous thrombosis and embolism: Secondary | ICD-10-CM

## 2017-01-22 DIAGNOSIS — Z5181 Encounter for therapeutic drug level monitoring: Secondary | ICD-10-CM | POA: Diagnosis not present

## 2017-01-22 LAB — POCT INR: INR: 2.9

## 2017-02-07 DIAGNOSIS — E1142 Type 2 diabetes mellitus with diabetic polyneuropathy: Secondary | ICD-10-CM | POA: Diagnosis not present

## 2017-02-07 DIAGNOSIS — B351 Tinea unguium: Secondary | ICD-10-CM | POA: Diagnosis not present

## 2017-02-16 ENCOUNTER — Other Ambulatory Visit: Payer: Self-pay | Admitting: Cardiology

## 2017-02-18 ENCOUNTER — Telehealth: Payer: Self-pay | Admitting: *Deleted

## 2017-02-18 MED ORDER — WARFARIN SODIUM 5 MG PO TABS: ORAL_TABLET | ORAL | 3 refills | Status: DC

## 2017-02-18 NOTE — Telephone Encounter (Signed)
°  1. Which medications need to be refilled? (please list name of each medication and dose if known) warfarin (COUMADIN) 5 MG tablet [[id number]]    2. Which pharmacy/location (including street and city if local pharmacy) is medication to be sent to? [facility]--   only has enough to last till Friday-has an apt scheduled for September   3. Do they need a 30 day or 90 day supply?

## 2017-03-05 ENCOUNTER — Ambulatory Visit (INDEPENDENT_AMBULATORY_CARE_PROVIDER_SITE_OTHER): Payer: Medicare Other | Admitting: *Deleted

## 2017-03-05 DIAGNOSIS — Z86718 Personal history of other venous thrombosis and embolism: Secondary | ICD-10-CM | POA: Diagnosis not present

## 2017-03-05 DIAGNOSIS — Z5181 Encounter for therapeutic drug level monitoring: Secondary | ICD-10-CM

## 2017-03-05 DIAGNOSIS — I639 Cerebral infarction, unspecified: Secondary | ICD-10-CM | POA: Diagnosis not present

## 2017-03-05 DIAGNOSIS — I6350 Cerebral infarction due to unspecified occlusion or stenosis of unspecified cerebral artery: Secondary | ICD-10-CM | POA: Diagnosis not present

## 2017-03-05 DIAGNOSIS — Z7901 Long term (current) use of anticoagulants: Secondary | ICD-10-CM | POA: Diagnosis not present

## 2017-03-05 LAB — POCT INR: INR: 2.3

## 2017-03-23 ENCOUNTER — Other Ambulatory Visit: Payer: Self-pay | Admitting: Cardiology

## 2017-04-14 NOTE — Progress Notes (Signed)
Cardiology Office Note  Date: 04/15/2017   ID: Dennis Yu, DOB 1946/03/05, Dennis Yu  PCP: [name], MD  Primary Cardiologist: Rozann Lesches, MD   Chief Complaint  Patient presents with  . Cardiomyopathy    History of Present Illness: Dennis Yu is a 71 y.o. male last seen in May. He presents for a follow-up visit. He states that on higher dose Lasix his breathing has improved. Weight is down a few pounds as well. He does not report any chest pain or palpitations.  He continues to follow in the anticoagulation clinic on Coumadin. Last INR was 2.3. He has had no bleeding problems.  Patient prefers conservative overall management without cardiac catheterization. Prior Cardiolite study from 2012 demonstrated area of dense scar in the inferoposterior wall as well as anteroseptal distribution. He did have mild to moderate peri-infarct ischemia in the periapical distribution.  He continues to follow with Dr. Nevada Crane, has a follow-up physical with lab work in the near future.  Past Medical History:  Diagnosis Date  . Bronchitis 11/2012  . Cardiomyopathy    LVEF 25%  . Diabetes mellitus   . Erectile dysfunction   . Essential hypertension, benign   . GERD (gastroesophageal reflux disease)   . History of colonic polyps   . Mixed hyperlipidemia   . 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 Obstructive sleep apnea   . Patent foramen ovale   . Phlebitis   . 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 Superficial phlebitis 12/10/2012  . Type 2 diabetes mellitus ([place])   . Vitamin D deficiency     History reviewed. No pertinent surgical history.  Current Outpatient Prescriptions  Medication Sig Dispense Refill  . aspirin 81 MG tablet Take 81 mg by mouth daily.      . carvedilol (COREG) 12.5 MG tablet TAKE 1 TABLET TWICE DAILY WITH MEALS 180 tablet 3  . furosemide (LASIX) 40 MG tablet Take 40 mg ( 1 Tablet)  in the AM and Take 20 mg(1/2 Tablet)  in the PM  135 tablet 3  . gabapentin (NEURONTIN) 300 MG capsule Take 3 capsules (900 mg total) by mouth daily. 90 capsule 11  . lisinopril (PRINIVIL,ZESTRIL) 20 MG tablet TAKE 1 TABLET EVERY DAY 90 tablet 3  . metFORMIN (GLUCOPHAGE) 500 MG tablet Take 500 mg by mouth 2 (two) times daily with a meal.      . NON FORMULARY legatrin pm Daily      . Omega-3 Fatty Acids (FISH OIL) 1000 MG CAPS Take 1,000 mg by mouth 2 (two) times daily.    . Potassium 99 MG TABS Take 99 mg by mouth 2 (two) times daily. Take 2 tabs am, and 2 tabs pm    . PROAIR HFA 108 (90 BASE) MCG/ACT inhaler Inhale 2 puffs into the lungs 4 (four) times daily.     . simvastatin (ZOCOR) 20 MG tablet TAKE 1 TABLET (20 MG TOTAL) BY MOUTH AT BEDTIME. 90 tablet 3  . warfarin (COUMADIN) 5 MG tablet Take 1 tablet daily except 2 tablets on Mondays and Fridays 90 tablet 3   No current facility-administered medications for this visit.    Allergies:  Patient has no known allergies.   Social History: The patient  reports that he quit smoking about 13 years ago. His smoking use included Cigarettes. He started smoking about 49 years ago. He has never used smokeless tobacco. He reports that he does  not drink alcohol or use drugs.   ROS:  Please see the history of present illness. Otherwise, complete review of systems is positive for hearing loss uses hearing aids.  All other systems are reviewed and negative.   Physical Exam: VS:  BP 126/78 (BP Location: Left Arm)   Pulse 62   Ht 6\' 1"  (1.854 m)   Wt 195 lb (88.5 kg)   SpO2 97%   BMI 25.73 kg/m , BMI Body mass index is 25.73 kg/m.  Wt Readings from Last 3 Encounters:  04/15/17 195 lb (88.5 kg)  01/14/17 196 lb 6.4 oz (89.1 kg)  12/09/16 199 lb (90.3 kg)    General: Overweight male,appears comfortable at rest. Using a cane to walk. HEENT: Conjunctiva and lids normal, oropharynx clear. Neck: Supple, elevated JVP, nocarotid bruits, no thyromegaly. Lungs: Diminished breath sounds, nonlabored  breathing at rest. Cardiac: Regular rate and rhythm, no S3, 2/6systolic murmur, no pericardial rub. Abdomen: Soft, nontender, bowel sounds present, no guarding or rebound. Extremities: No significant pittingedema, distal pulses 2+. Skin: Warm and dry. Musculoskeletal: No kyphosis. Neuropsychiatric: Alert and oriented x3, affect grossly appropriate.  ECG: I personally reviewed the tracing from 03/26/2016 which showed sinus rhythm with old anterior and inferolateral infarct pattern, nonspecific ST-T changes.  Recent Labwork: 09/23/2016: BUN 18; Creatinine, Ser 1.22; Potassium 4.6; Sodium 137   Other Studies Reviewed Today:  Echocardiogram 04/10/2016: Study Conclusions  - Left ventricle: The cavity size was mildly dilated. Wall thickness was increased in a pattern of moderate LVH. Systolic function was severely reduced. The estimated ejection fraction was in the range of 25% to 30%. Diffuse hypokinesis. Noncompacted appearance of the apical and distal anterolateral myocardium. Doppler parameters are consistent with abnormal left ventricular relaxation (grade 1 diastolic dysfunction). - Regional wall motion abnormality: Akinesis of the apical anterior, apical lateral, and apical myocardium; severe hypokinesis of the mid anterolateral myocardium. - Aortic valve: Trileaflet; mildly thickened leaflets. - Aorta: Mild aortic root dilatation. Aortic root dimension: 42 mm (ED). - Mitral valve: Mildly thickened leaflets . There was mild regurgitation. - Left atrium: The atrium was mildly dilated. - Right ventricle: Systolic function was mildly reduced. - Atrial septum: No defect or patent foramen ovale was identified. - Tricuspid valve: Mildly thickened leaflets.  Assessment and Plan:  1. Chronic combined heart failure, symptomatically improved on higher dose Lasix. Plan to continue present regimen. He states that he is following a restricted sodium diet.  2. Ischemic  cardiomyopathy with LVEF 25-30%. As noted above, he prefers conservative management without further invasive testing or ICD.  3. Hyperlipidemia, continued on statin therapy with follow-up pending per Dr. Nevada Crane.  4. History of stroke and inconsistent documentation of PFO. He has been stable and continues on aspirin and Coumadin.  Current medicines were reviewed with the patient today.   Orders Placed This Encounter  Procedures  . EKG 12-Lead    Disposition: Follow-up in 6 months, sooner if needed.  Signed, Satira Sark, MD, [facility] 04/15/2017 10:07 AM    [place] [facility] [place]. [address], [place], [place] [address] Phone: [phone]; Fax: [phone]

## 2017-04-15 ENCOUNTER — Ambulatory Visit (INDEPENDENT_AMBULATORY_CARE_PROVIDER_SITE_OTHER): Payer: Medicare Other | Admitting: Cardiology

## 2017-04-15 ENCOUNTER — Encounter: Payer: Self-pay | Admitting: Cardiology

## 2017-04-15 ENCOUNTER — Ambulatory Visit: Payer: Medicare Other | Admitting: Cardiology

## 2017-04-15 VITALS — BP 126/78 | HR 62 | Ht 73.0 in | Wt 195.0 lb

## 2017-04-15 DIAGNOSIS — I6350 Cerebral infarction due to unspecified occlusion or stenosis of unspecified cerebral artery: Secondary | ICD-10-CM | POA: Diagnosis not present

## 2017-04-15 DIAGNOSIS — I255 Ischemic cardiomyopathy: Secondary | ICD-10-CM

## 2017-04-15 DIAGNOSIS — I2589 Other forms of chronic ischemic heart disease: Secondary | ICD-10-CM

## 2017-04-15 DIAGNOSIS — Z8673 Personal history of transient ischemic attack (TIA), and cerebral infarction without residual deficits: Secondary | ICD-10-CM

## 2017-04-15 DIAGNOSIS — E782 Mixed hyperlipidemia: Secondary | ICD-10-CM

## 2017-04-15 DIAGNOSIS — I5042 Chronic combined systolic (congestive) and diastolic (congestive) heart failure: Secondary | ICD-10-CM | POA: Diagnosis not present

## 2017-04-15 NOTE — Patient Instructions (Signed)
Your physician wants you to follow-up in:6 months with Dr.McDowell You will receive a reminder letter in the mail two months in advance. If you don't receive a letter, please call our office to schedule the follow-up appointment.   Your physician recommends that you continue on your current medications as directed. Please refer to the Current Medication list given to you today.   If you need a refill on your cardiac medications before your next appointment, please call your pharmacy.    No lab work or tests ordered today.      Thank you for choosing [facility] !         

## 2017-04-16 ENCOUNTER — Ambulatory Visit (INDEPENDENT_AMBULATORY_CARE_PROVIDER_SITE_OTHER): Payer: Medicare Other | Admitting: *Deleted

## 2017-04-16 DIAGNOSIS — Z5181 Encounter for therapeutic drug level monitoring: Secondary | ICD-10-CM

## 2017-04-16 DIAGNOSIS — Z86718 Personal history of other venous thrombosis and embolism: Secondary | ICD-10-CM | POA: Diagnosis not present

## 2017-04-16 DIAGNOSIS — I6350 Cerebral infarction due to unspecified occlusion or stenosis of unspecified cerebral artery: Secondary | ICD-10-CM

## 2017-04-16 DIAGNOSIS — Z7901 Long term (current) use of anticoagulants: Secondary | ICD-10-CM

## 2017-04-16 DIAGNOSIS — I639 Cerebral infarction, unspecified: Secondary | ICD-10-CM

## 2017-04-16 LAB — POCT INR: INR: 2.5

## 2017-04-17 ENCOUNTER — Other Ambulatory Visit: Payer: Self-pay | Admitting: Cardiology

## 2017-04-23 DIAGNOSIS — E119 Type 2 diabetes mellitus without complications: Secondary | ICD-10-CM | POA: Diagnosis not present

## 2017-04-23 DIAGNOSIS — E782 Mixed hyperlipidemia: Secondary | ICD-10-CM | POA: Diagnosis not present

## 2017-04-23 DIAGNOSIS — I10 Essential (primary) hypertension: Secondary | ICD-10-CM | POA: Diagnosis not present

## 2017-04-25 DIAGNOSIS — I129 Hypertensive chronic kidney disease with stage 1 through stage 4 chronic kidney disease, or unspecified chronic kidney disease: Secondary | ICD-10-CM | POA: Diagnosis not present

## 2017-04-25 DIAGNOSIS — E119 Type 2 diabetes mellitus without complications: Secondary | ICD-10-CM | POA: Diagnosis not present

## 2017-04-25 DIAGNOSIS — E785 Hyperlipidemia, unspecified: Secondary | ICD-10-CM | POA: Diagnosis not present

## 2017-04-25 DIAGNOSIS — I482 Chronic atrial fibrillation: Secondary | ICD-10-CM | POA: Diagnosis not present

## 2017-04-25 DIAGNOSIS — N182 Chronic kidney disease, stage 2 (mild): Secondary | ICD-10-CM | POA: Diagnosis not present

## 2017-04-25 DIAGNOSIS — B351 Tinea unguium: Secondary | ICD-10-CM | POA: Diagnosis not present

## 2017-04-25 DIAGNOSIS — E1142 Type 2 diabetes mellitus with diabetic polyneuropathy: Secondary | ICD-10-CM | POA: Diagnosis not present

## 2017-04-25 DIAGNOSIS — G4733 Obstructive sleep apnea (adult) (pediatric): Secondary | ICD-10-CM | POA: Diagnosis not present

## 2017-04-25 DIAGNOSIS — Z6827 Body mass index (BMI) 27.0-27.9, adult: Secondary | ICD-10-CM | POA: Diagnosis not present

## 2017-05-28 ENCOUNTER — Ambulatory Visit (INDEPENDENT_AMBULATORY_CARE_PROVIDER_SITE_OTHER): Payer: Medicare Other | Admitting: *Deleted

## 2017-05-28 DIAGNOSIS — Z86718 Personal history of other venous thrombosis and embolism: Secondary | ICD-10-CM

## 2017-05-28 DIAGNOSIS — I639 Cerebral infarction, unspecified: Secondary | ICD-10-CM | POA: Diagnosis not present

## 2017-05-28 DIAGNOSIS — Z7901 Long term (current) use of anticoagulants: Secondary | ICD-10-CM

## 2017-05-28 DIAGNOSIS — I6349 Cerebral infarction due to embolism of other cerebral artery: Secondary | ICD-10-CM

## 2017-05-28 DIAGNOSIS — I6350 Cerebral infarction due to unspecified occlusion or stenosis of unspecified cerebral artery: Secondary | ICD-10-CM | POA: Diagnosis not present

## 2017-05-28 DIAGNOSIS — Z5181 Encounter for therapeutic drug level monitoring: Secondary | ICD-10-CM

## 2017-05-28 LAB — POCT INR: INR: 3

## 2017-07-04 DIAGNOSIS — B351 Tinea unguium: Secondary | ICD-10-CM | POA: Diagnosis not present

## 2017-07-04 DIAGNOSIS — E1142 Type 2 diabetes mellitus with diabetic polyneuropathy: Secondary | ICD-10-CM | POA: Diagnosis not present

## 2017-07-09 ENCOUNTER — Ambulatory Visit (INDEPENDENT_AMBULATORY_CARE_PROVIDER_SITE_OTHER): Payer: Medicare Other | Admitting: *Deleted

## 2017-07-09 DIAGNOSIS — Z86718 Personal history of other venous thrombosis and embolism: Secondary | ICD-10-CM

## 2017-07-09 DIAGNOSIS — I639 Cerebral infarction, unspecified: Secondary | ICD-10-CM | POA: Diagnosis not present

## 2017-07-09 DIAGNOSIS — Z5181 Encounter for therapeutic drug level monitoring: Secondary | ICD-10-CM | POA: Diagnosis not present

## 2017-07-09 DIAGNOSIS — Z7901 Long term (current) use of anticoagulants: Secondary | ICD-10-CM | POA: Diagnosis not present

## 2017-07-09 DIAGNOSIS — I6350 Cerebral infarction due to unspecified occlusion or stenosis of unspecified cerebral artery: Secondary | ICD-10-CM

## 2017-07-09 LAB — POCT INR: INR: 2.2

## 2017-08-08 DIAGNOSIS — Z6826 Body mass index (BMI) 26.0-26.9, adult: Secondary | ICD-10-CM | POA: Diagnosis not present

## 2017-08-08 DIAGNOSIS — G4733 Obstructive sleep apnea (adult) (pediatric): Secondary | ICD-10-CM | POA: Diagnosis not present

## 2017-08-20 ENCOUNTER — Telehealth: Payer: Self-pay

## 2017-08-20 ENCOUNTER — Ambulatory Visit (INDEPENDENT_AMBULATORY_CARE_PROVIDER_SITE_OTHER): Payer: Medicare Other | Admitting: *Deleted

## 2017-08-20 DIAGNOSIS — Z5181 Encounter for therapeutic drug level monitoring: Secondary | ICD-10-CM | POA: Diagnosis not present

## 2017-08-20 DIAGNOSIS — I6350 Cerebral infarction due to unspecified occlusion or stenosis of unspecified cerebral artery: Secondary | ICD-10-CM | POA: Diagnosis not present

## 2017-08-20 DIAGNOSIS — Z86718 Personal history of other venous thrombosis and embolism: Secondary | ICD-10-CM

## 2017-08-20 DIAGNOSIS — I639 Cerebral infarction, unspecified: Secondary | ICD-10-CM

## 2017-08-20 DIAGNOSIS — Z7901 Long term (current) use of anticoagulants: Secondary | ICD-10-CM | POA: Diagnosis not present

## 2017-08-20 DIAGNOSIS — I6349 Cerebral infarction due to embolism of other cerebral artery: Secondary | ICD-10-CM | POA: Diagnosis not present

## 2017-08-20 LAB — POCT INR: INR: 2.6

## 2017-08-20 MED ORDER — FUROSEMIDE 40 MG PO TABS: ORAL_TABLET | ORAL | 3 refills | Status: DC

## 2017-08-20 MED ORDER — LISINOPRIL 20 MG PO TABS: 20.0000 mg | ORAL_TABLET | Freq: Every day | ORAL | 3 refills | Status: DC

## 2017-08-20 MED ORDER — WARFARIN SODIUM 5 MG PO TABS: ORAL_TABLET | ORAL | 3 refills | Status: DC

## 2017-08-20 MED ORDER — CARVEDILOL 12.5 MG PO TABS: 12.5000 mg | ORAL_TABLET | Freq: Two times a day (BID) | ORAL | 3 refills | Status: DC

## 2017-08-20 MED ORDER — SIMVASTATIN 20 MG PO TABS: 20.0000 mg | ORAL_TABLET | Freq: Every day | ORAL | 3 refills | Status: DC

## 2017-08-20 NOTE — Patient Instructions (Signed)
Continue 1 tablet daily except 2 tablets on Mondays and Fridays Recheck in 6 weeks

## 2017-08-22 ENCOUNTER — Other Ambulatory Visit: Payer: Self-pay

## 2017-08-22 MED ORDER — WARFARIN SODIUM 5 MG PO TABS: ORAL_TABLET | ORAL | 3 refills | Status: DC

## 2017-08-22 MED ORDER — FUROSEMIDE 40 MG PO TABS: ORAL_TABLET | ORAL | 3 refills | Status: DC

## 2017-08-22 MED ORDER — LISINOPRIL 20 MG PO TABS: 20.0000 mg | ORAL_TABLET | Freq: Every day | ORAL | 3 refills | Status: DC

## 2017-08-22 MED ORDER — CARVEDILOL 12.5 MG PO TABS: 12.5000 mg | ORAL_TABLET | Freq: Two times a day (BID) | ORAL | 3 refills | Status: DC

## 2017-08-22 MED ORDER — SIMVASTATIN 20 MG PO TABS: 20.0000 mg | ORAL_TABLET | Freq: Every day | ORAL | 3 refills | Status: DC

## 2017-08-22 NOTE — Telephone Encounter (Signed)
Refilled meds to [facility] per request

## 2017-09-16 DIAGNOSIS — B351 Tinea unguium: Secondary | ICD-10-CM | POA: Diagnosis not present

## 2017-09-16 DIAGNOSIS — E1142 Type 2 diabetes mellitus with diabetic polyneuropathy: Secondary | ICD-10-CM | POA: Diagnosis not present

## 2017-09-30 DIAGNOSIS — E119 Type 2 diabetes mellitus without complications: Secondary | ICD-10-CM | POA: Diagnosis not present

## 2017-09-30 DIAGNOSIS — I10 Essential (primary) hypertension: Secondary | ICD-10-CM | POA: Diagnosis not present

## 2017-09-30 DIAGNOSIS — E782 Mixed hyperlipidemia: Secondary | ICD-10-CM | POA: Diagnosis not present

## 2017-10-01 ENCOUNTER — Ambulatory Visit (INDEPENDENT_AMBULATORY_CARE_PROVIDER_SITE_OTHER): Payer: Medicare Other | Admitting: *Deleted

## 2017-10-01 DIAGNOSIS — I6350 Cerebral infarction due to unspecified occlusion or stenosis of unspecified cerebral artery: Secondary | ICD-10-CM

## 2017-10-01 DIAGNOSIS — I639 Cerebral infarction, unspecified: Secondary | ICD-10-CM

## 2017-10-01 DIAGNOSIS — Z7901 Long term (current) use of anticoagulants: Secondary | ICD-10-CM | POA: Diagnosis not present

## 2017-10-01 DIAGNOSIS — Z86718 Personal history of other venous thrombosis and embolism: Secondary | ICD-10-CM | POA: Diagnosis not present

## 2017-10-01 DIAGNOSIS — Z5181 Encounter for therapeutic drug level monitoring: Secondary | ICD-10-CM | POA: Diagnosis not present

## 2017-10-01 LAB — POCT INR: INR: 1.7

## 2017-10-01 NOTE — Patient Instructions (Signed)
Take coumadin 2 tablets tonight then resume 1 tablet daily except 2 tablets on Mondays and Fridays Recheck in 3 weeks

## 2017-10-07 DIAGNOSIS — E0822 Diabetes mellitus due to underlying condition with diabetic chronic kidney disease: Secondary | ICD-10-CM | POA: Diagnosis not present

## 2017-10-07 DIAGNOSIS — R0602 Shortness of breath: Secondary | ICD-10-CM | POA: Diagnosis not present

## 2017-10-07 DIAGNOSIS — Z712 Person consulting for explanation of examination or test findings: Secondary | ICD-10-CM | POA: Diagnosis not present

## 2017-10-07 DIAGNOSIS — Z6826 Body mass index (BMI) 26.0-26.9, adult: Secondary | ICD-10-CM | POA: Diagnosis not present

## 2017-10-07 DIAGNOSIS — G4733 Obstructive sleep apnea (adult) (pediatric): Secondary | ICD-10-CM | POA: Diagnosis not present

## 2017-10-07 DIAGNOSIS — I10 Essential (primary) hypertension: Secondary | ICD-10-CM | POA: Diagnosis not present

## 2017-10-07 DIAGNOSIS — N183 Chronic kidney disease, stage 3 (moderate): Secondary | ICD-10-CM | POA: Diagnosis not present

## 2017-10-07 DIAGNOSIS — E782 Mixed hyperlipidemia: Secondary | ICD-10-CM | POA: Diagnosis not present

## 2017-10-07 DIAGNOSIS — I639 Cerebral infarction, unspecified: Secondary | ICD-10-CM | POA: Diagnosis not present

## 2017-10-07 DIAGNOSIS — I482 Chronic atrial fibrillation: Secondary | ICD-10-CM | POA: Diagnosis not present

## 2017-10-22 ENCOUNTER — Ambulatory Visit (INDEPENDENT_AMBULATORY_CARE_PROVIDER_SITE_OTHER): Payer: Medicare Other | Admitting: *Deleted

## 2017-10-22 DIAGNOSIS — I6350 Cerebral infarction due to unspecified occlusion or stenosis of unspecified cerebral artery: Secondary | ICD-10-CM | POA: Diagnosis not present

## 2017-10-22 DIAGNOSIS — Z5181 Encounter for therapeutic drug level monitoring: Secondary | ICD-10-CM | POA: Diagnosis not present

## 2017-10-22 DIAGNOSIS — I639 Cerebral infarction, unspecified: Secondary | ICD-10-CM

## 2017-10-22 DIAGNOSIS — Z7901 Long term (current) use of anticoagulants: Secondary | ICD-10-CM

## 2017-10-22 DIAGNOSIS — Z86718 Personal history of other venous thrombosis and embolism: Secondary | ICD-10-CM | POA: Diagnosis not present

## 2017-10-22 LAB — POCT INR: INR: 2

## 2017-10-22 NOTE — Patient Instructions (Signed)
Continue coumadin 1 tablet daily except 2 tablets on Mondays and Fridays Recheck in 4 weeks 

## 2017-10-24 NOTE — Progress Notes (Signed)
Cardiology Office Note  Date: 10/27/2017   ID: Dennis Yu, DOB 05-25-1946, MRN [id number]  PCP: [name], MD  Primary Cardiologist: Rozann Lesches, MD   Chief Complaint  Patient presents with  . Cardiomyopathy    History of Present Illness: Dennis Yu is a 72 y.o. male last seen in September 2018.  He presents for a routine follow-up visit.  Reports stable NYHA class II dyspnea, no chest pain, palpitations, or syncope.  He continues on Coumadin with follow-up in the anticoagulation clinic.  No bleeding episodes.  Patient prefers conservative overall management without cardiac catheterization. Prior Cardiolite study from 2012 demonstrated area of dense scar in the inferoposterior wall as well as anteroseptal distribution. He did have mild to moderate peri-infarct ischemia in the periapical distribution.  I reviewed his cardiac medications which are stable and outlined below.  He had a recent visit with Dr. Nevada Yu including lab work which we will request for review.   Past Medical History:  Diagnosis Date  . Bronchitis 11/2012  . Cardiomyopathy    LVEF 25%  . Diabetes mellitus   . Erectile dysfunction   . Essential hypertension, benign   . GERD (gastroesophageal reflux disease)   . History of colonic polyps   . Mixed hyperlipidemia   . 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 Obstructive sleep apnea   . Patent foramen ovale   . Phlebitis   . 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 Superficial phlebitis 12/10/2012  . Type 2 diabetes mellitus ([place])   . Vitamin D deficiency     History reviewed. No pertinent surgical history.  Current Outpatient Medications  Medication Sig Dispense Refill  . aspirin 81 MG tablet Take 81 mg by mouth daily.      . carvedilol (COREG) 12.5 MG tablet Take 1 tablet (12.5 mg total) by mouth 2 (two) times daily with a meal. 180 tablet 3  . furosemide (LASIX) 40 MG tablet Take 40 mg ( 1 Tablet)  in the AM and Take 20  mg(1/2 Tablet)  in the PM 135 tablet 3  . gabapentin (NEURONTIN) 300 MG capsule Take 3 capsules (900 mg total) by mouth daily. 90 capsule 11  . lisinopril (PRINIVIL,ZESTRIL) 20 MG tablet Take 1 tablet (20 mg total) by mouth daily. 90 tablet 3  . metFORMIN (GLUCOPHAGE) 500 MG tablet Take 500 mg by mouth 2 (two) times daily with a meal.      . NON FORMULARY legatrin pm Daily      . potassium chloride (K-DUR,KLOR-CON) 10 MEQ tablet Take 10 mEq by mouth 2 (two) times daily.    [name] PROAIR HFA 108 (90 BASE) MCG/ACT inhaler Inhale 2 puffs into the lungs 4 (four) times daily.     . simvastatin (ZOCOR) 20 MG tablet Take 1 tablet (20 mg total) by mouth at bedtime. 90 tablet 3  . warfarin (COUMADIN) 5 MG tablet Take 1 tablet daily except 2 tablets on Mondays and Fridays 90 tablet 3   No current facility-administered medications for this visit.    Allergies:  Patient has no known allergies.   Social History: The patient  reports that he quit smoking about 14 years ago. His smoking use included cigarettes. He started smoking about 50 years ago. He has never used smokeless tobacco. He reports that he does not drink alcohol or use drugs.   ROS:  Please see the history of present illness. Otherwise, complete review of  systems is positive for arthritic stiffness, uses a cane to ambulate.  All other systems are reviewed and negative.   Physical Exam: VS:  BP 113/69   Pulse (!) 56   Ht 6' (1.829 m)   Wt 195 lb (88.5 kg)   SpO2 97%   BMI 26.45 kg/m , BMI Body mass index is 26.45 kg/m.  Wt Readings from Last 3 Encounters:  10/27/17 195 lb (88.5 kg)  04/15/17 195 lb (88.5 kg)  01/14/17 196 lb 6.4 oz (89.1 kg)    General: Patient appears comfortable at rest. HEENT: Conjunctiva and lids normal, oropharynx clear. Neck: Supple, no elevated JVP or carotid bruits, no thyromegaly. Lungs: Decreased breath sounds, nonlabored breathing at rest. Cardiac: Regular rate and rhythm, no S3, 2/6 systolic  murmur. Abdomen: Soft, nontender, bowel sounds present. Extremities: No pitting edema, distal pulses 2+. Skin: Warm and dry. Musculoskeletal: No kyphosis. Neuropsychiatric: Alert and oriented x3, affect grossly appropriate.  ECG: I personally reviewed the tracing from 04/15/2017 which showed sinus rhythm with old anterior and inferior infarct pattern.  Recent Labwork:  February 2018: potassium 4.6, BUN 18, creatinine 1.22  Other Studies Reviewed Today:   Echocardiogram 04/10/2016: Study Conclusions  - Left ventricle: The cavity size was mildly dilated. Wall thickness was increased in a pattern of moderate LVH. Systolic function was severely reduced. The estimated ejection fraction was in the range of 25% to 30%. Diffuse hypokinesis. Noncompacted appearance of the apical and distal anterolateral myocardium. Doppler parameters are consistent with abnormal left ventricular relaxation (grade 1 diastolic dysfunction). - Regional wall motion abnormality: Akinesis of the apical anterior, apical lateral, and apical myocardium; severe hypokinesis of the mid anterolateral myocardium. - Aortic valve: Trileaflet; mildly thickened leaflets. - Aorta: Mild aortic root dilatation. Aortic root dimension: 42 mm (ED). - Mitral valve: Mildly thickened leaflets . There was mild regurgitation. - Left atrium: The atrium was mildly dilated. - Right ventricle: Systolic function was mildly reduced. - Atrial septum: No defect or patent foramen ovale was identified. - Tricuspid valve: Mildly thickened leaflets.  Assessment and Plan:  1.  Ischemic cardiomyopathy with LVEF 25-30%, also non-compacted appearance of apical and anterolateral myocardium.  He prefers conservative management, no changes made to medical therapy.  2.  Chronic systolic heart failure, weight stable and no leg edema on current dose of Lasix.  He is doing well with salt restriction in the diet.  3.  Mixed  hyperlipidemia, continues on Zocor.  Requesting recent lab work from Dr. Nevada Yu.  4.  Prior history of stroke with inconsistent documentation of PFO.  He remains on aspirin and Coumadin.  Current medicines were reviewed with the patient today.  Disposition: Follow-up in 6 months.  Signed, Satira Sark, MD, [facility] 10/27/2017 10:47 AM    [place] [facility] [place]. [address] [place], [place] [address] Phone: [phone]; Fax: [phone]

## 2017-10-27 ENCOUNTER — Encounter: Payer: Self-pay | Admitting: Cardiology

## 2017-10-27 ENCOUNTER — Ambulatory Visit (INDEPENDENT_AMBULATORY_CARE_PROVIDER_SITE_OTHER): Payer: Medicare Other | Admitting: Cardiology

## 2017-10-27 VITALS — BP 113/69 | HR 56 | Ht 72.0 in | Wt 195.0 lb

## 2017-10-27 DIAGNOSIS — I255 Ischemic cardiomyopathy: Secondary | ICD-10-CM

## 2017-10-27 DIAGNOSIS — Z8673 Personal history of transient ischemic attack (TIA), and cerebral infarction without residual deficits: Secondary | ICD-10-CM

## 2017-10-27 DIAGNOSIS — I6350 Cerebral infarction due to unspecified occlusion or stenosis of unspecified cerebral artery: Secondary | ICD-10-CM

## 2017-10-27 DIAGNOSIS — I5042 Chronic combined systolic (congestive) and diastolic (congestive) heart failure: Secondary | ICD-10-CM

## 2017-10-27 DIAGNOSIS — E782 Mixed hyperlipidemia: Secondary | ICD-10-CM

## 2017-10-27 NOTE — Patient Instructions (Addendum)
Your physician wants you to follow-up in:6 months with Dr.McDowell You will receive a reminder letter in the mail two months in advance. If you don't receive a letter, please call our office to schedule the follow-up appointment.   Your physician recommends that you continue on your current medications as directed. Please refer to the Current Medication list given to you today.   If you need a refill on your cardiac medications before your next appointment, please call your pharmacy.    No lab work or tests ordered today.      Thank you for choosing  [facility] !         

## 2017-11-19 ENCOUNTER — Ambulatory Visit (INDEPENDENT_AMBULATORY_CARE_PROVIDER_SITE_OTHER): Payer: Medicare Other | Admitting: *Deleted

## 2017-11-19 DIAGNOSIS — I639 Cerebral infarction, unspecified: Secondary | ICD-10-CM | POA: Diagnosis not present

## 2017-11-19 DIAGNOSIS — Z5181 Encounter for therapeutic drug level monitoring: Secondary | ICD-10-CM

## 2017-11-19 DIAGNOSIS — Z7901 Long term (current) use of anticoagulants: Secondary | ICD-10-CM | POA: Diagnosis not present

## 2017-11-19 DIAGNOSIS — I6349 Cerebral infarction due to embolism of other cerebral artery: Secondary | ICD-10-CM | POA: Diagnosis not present

## 2017-11-19 DIAGNOSIS — I6350 Cerebral infarction due to unspecified occlusion or stenosis of unspecified cerebral artery: Secondary | ICD-10-CM

## 2017-11-19 DIAGNOSIS — Z86718 Personal history of other venous thrombosis and embolism: Secondary | ICD-10-CM

## 2017-11-19 LAB — POCT INR: INR: 2.2

## 2017-11-19 NOTE — Patient Instructions (Signed)
Continue coumadin 1 tablet daily except 2 tablets on Mondays and Fridays Recheck in 4 weeks 

## 2017-11-25 DIAGNOSIS — B351 Tinea unguium: Secondary | ICD-10-CM | POA: Diagnosis not present

## 2017-11-25 DIAGNOSIS — E1142 Type 2 diabetes mellitus with diabetic polyneuropathy: Secondary | ICD-10-CM | POA: Diagnosis not present

## 2017-12-17 ENCOUNTER — Ambulatory Visit (INDEPENDENT_AMBULATORY_CARE_PROVIDER_SITE_OTHER): Payer: Medicare Other | Admitting: *Deleted

## 2017-12-17 DIAGNOSIS — I6349 Cerebral infarction due to embolism of other cerebral artery: Secondary | ICD-10-CM | POA: Diagnosis not present

## 2017-12-17 DIAGNOSIS — Z5181 Encounter for therapeutic drug level monitoring: Secondary | ICD-10-CM | POA: Diagnosis not present

## 2017-12-17 DIAGNOSIS — Z7901 Long term (current) use of anticoagulants: Secondary | ICD-10-CM | POA: Diagnosis not present

## 2017-12-17 DIAGNOSIS — I6350 Cerebral infarction due to unspecified occlusion or stenosis of unspecified cerebral artery: Secondary | ICD-10-CM

## 2017-12-17 DIAGNOSIS — I639 Cerebral infarction, unspecified: Secondary | ICD-10-CM | POA: Diagnosis not present

## 2017-12-17 DIAGNOSIS — Z86718 Personal history of other venous thrombosis and embolism: Secondary | ICD-10-CM

## 2017-12-17 LAB — POCT INR: INR: 3.4

## 2017-12-17 NOTE — Patient Instructions (Signed)
Hold coumadin tonight then resume 1 tablet daily except 2 tablets on Mondays and Fridays Recheck in 4 weeks

## 2018-01-09 DIAGNOSIS — E1142 Type 2 diabetes mellitus with diabetic polyneuropathy: Secondary | ICD-10-CM | POA: Diagnosis not present

## 2018-01-14 ENCOUNTER — Inpatient Hospital Stay (HOSPITAL_BASED_OUTPATIENT_CLINIC_OR_DEPARTMENT_OTHER): Payer: Medicare Other | Admitting: Hematology

## 2018-01-14 ENCOUNTER — Encounter (HOSPITAL_COMMUNITY): Payer: Self-pay | Admitting: Hematology

## 2018-01-14 ENCOUNTER — Ambulatory Visit (INDEPENDENT_AMBULATORY_CARE_PROVIDER_SITE_OTHER): Payer: Medicare Other | Admitting: *Deleted

## 2018-01-14 ENCOUNTER — Inpatient Hospital Stay (HOSPITAL_COMMUNITY): Payer: Medicare Other | Attending: Hematology

## 2018-01-14 ENCOUNTER — Other Ambulatory Visit: Payer: Self-pay

## 2018-01-14 VITALS — BP 132/79 | HR 58 | Temp 97.9°F | Resp 16 | Wt 193.4 lb

## 2018-01-14 DIAGNOSIS — Z87891 Personal history of nicotine dependence: Secondary | ICD-10-CM | POA: Insufficient documentation

## 2018-01-14 DIAGNOSIS — Z8673 Personal history of transient ischemic attack (TIA), and cerebral infarction without residual deficits: Secondary | ICD-10-CM | POA: Diagnosis not present

## 2018-01-14 DIAGNOSIS — Z86718 Personal history of other venous thrombosis and embolism: Secondary | ICD-10-CM | POA: Diagnosis not present

## 2018-01-14 DIAGNOSIS — I639 Cerebral infarction, unspecified: Secondary | ICD-10-CM | POA: Diagnosis not present

## 2018-01-14 DIAGNOSIS — I6350 Cerebral infarction due to unspecified occlusion or stenosis of unspecified cerebral artery: Secondary | ICD-10-CM

## 2018-01-14 DIAGNOSIS — N189 Chronic kidney disease, unspecified: Secondary | ICD-10-CM

## 2018-01-14 DIAGNOSIS — Z7901 Long term (current) use of anticoagulants: Secondary | ICD-10-CM | POA: Diagnosis not present

## 2018-01-14 DIAGNOSIS — I809 Phlebitis and thrombophlebitis of unspecified site: Secondary | ICD-10-CM

## 2018-01-14 DIAGNOSIS — I8001 Phlebitis and thrombophlebitis of superficial vessels of right lower extremity: Secondary | ICD-10-CM | POA: Insufficient documentation

## 2018-01-14 DIAGNOSIS — Z5181 Encounter for therapeutic drug level monitoring: Secondary | ICD-10-CM | POA: Diagnosis not present

## 2018-01-14 LAB — CBC WITH DIFFERENTIAL/PLATELET
Basophils Absolute: 0 10*3/uL (ref 0.0–0.1)
Basophils Relative: 1 %
EOS ABS: 0.3 10*3/uL (ref 0.0–0.7)
Eosinophils Relative: 4 %
HEMATOCRIT: 42.1 % (ref 39.0–52.0)
HEMOGLOBIN: 12.7 g/dL — AB (ref 13.0–17.0)
LYMPHS ABS: 2.3 10*3/uL (ref 0.7–4.0)
LYMPHS PCT: 29 %
MCH: 27.5 pg (ref 26.0–34.0)
MCHC: 30.2 g/dL (ref 30.0–36.0)
MCV: 91.3 fL (ref 78.0–100.0)
Monocytes Absolute: 0.5 10*3/uL (ref 0.1–1.0)
Monocytes Relative: 6 %
NEUTROS ABS: 4.8 10*3/uL (ref 1.7–7.7)
NEUTROS PCT: 60 %
Platelets: 188 10*3/uL (ref 150–400)
RBC: 4.61 MIL/uL (ref 4.22–5.81)
RDW: 12.8 % (ref 11.5–15.5)
WBC: 7.9 10*3/uL (ref 4.0–10.5)

## 2018-01-14 LAB — COMPREHENSIVE METABOLIC PANEL
ALK PHOS: 51 U/L (ref 38–126)
ALT: 16 U/L — AB (ref 17–63)
AST: 19 U/L (ref 15–41)
Albumin: 4.3 g/dL (ref 3.5–5.0)
Anion gap: 8 (ref 5–15)
BUN: 30 mg/dL — ABNORMAL HIGH (ref 6–20)
CALCIUM: 9.3 mg/dL (ref 8.9–10.3)
CO2: 27 mmol/L (ref 22–32)
CREATININE: 1.7 mg/dL — AB (ref 0.61–1.24)
Chloride: 105 mmol/L (ref 101–111)
GFR calc non Af Amer: 39 mL/min — ABNORMAL LOW (ref >60)
GFR, EST AFRICAN AMERICAN: 45 mL/min — AB (ref >60)
GLUCOSE: 109 mg/dL — AB (ref 65–99)
Potassium: 5 mmol/L (ref 3.5–5.1)
SODIUM: 140 mmol/L (ref 135–145)
Total Bilirubin: 0.7 mg/dL (ref 0.3–1.2)
Total Protein: 7.7 g/dL (ref 6.5–8.1)

## 2018-01-14 LAB — POCT INR: INR: 3.6 — AB (ref 2.0–3.0)

## 2018-01-14 NOTE — Progress Notes (Signed)
[name], [name] [address]   CLINIC:  Medical Oncology/Hematology  PCP:  [name], MD [name] [address] [phone]   REASON FOR VISIT:  Follow-up for right leg superficial phlebitis.  CURRENT THERAPY: None.   INTERVAL HISTORY:  [name] 72 y.o. male returns for routine follow-up right leg phlebitis problems.  He did not have any recurrent episodes in the last 1 year.  He is continuing Coumadin for his stroke.  He takes 5 mg once a day and 10 mg on Mondays and Fridays.  Today his INR was 3.6.  Hence he was told to skip today's dose and take 5 mg every day except Monday when he takes 10 mg.  He follows up with Coumadin clinic.  No leg ulcers were reported.  No fevers, night sweats or weight loss.  REVIEW OF SYSTEMS:  Review of Systems  Respiratory: Positive for shortness of breath.   Gastrointestinal: Positive for diarrhea.  Neurological: Positive for numbness.  All other systems reviewed and are negative.    PAST MEDICAL/SURGICAL HISTORY:  Past Medical History:  Diagnosis Date  . Bronchitis 11/2012  . Cardiomyopathy    LVEF 25%  . Diabetes mellitus   . Erectile dysfunction   . Essential hypertension, benign   . GERD (gastroesophageal reflux disease)   . History of colonic polyps   . Mixed hyperlipidemia   . 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 Obstructive sleep apnea   . Patent foramen ovale   . Phlebitis   . 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 Superficial phlebitis 12/10/2012  . Type 2 diabetes mellitus ([name])   . Vitamin D deficiency    History reviewed. No pertinent surgical history.   SOCIAL HISTORY:  Social History   Socioeconomic History  . Marital status: Married    Spouse name: Not on file  . Number of children: Not on file  . Years of education: Not on file  . Highest education level: Not on file  Occupational History  . Occupation: Truck Education administrator:  RETIRED  Social Needs  . Financial resource strain: Not on file  . Food insecurity:    Worry: Not on file    Inability: Not on file  . Transportation needs:    Medical: Not on file    Non-medical: Not on file  Tobacco Use  . Smoking status: Former Smoker    Types: Cigarettes    Start date: 08/06/1967    Last attempt to quit: 08/06/2003    Years since quitting: 14.4  . Smokeless tobacco: Never Used  Substance and Sexual Activity  . Alcohol use: No    Alcohol/week: 0.0 oz  . Drug use: No  . Sexual activity: Not Currently  Lifestyle  . Physical activity:    Days per week: Not on file    Minutes per session: Not on file  . Stress: Not on file  Relationships  . Social connections:    Talks on phone: Not on file    Gets together: Not on file    Attends religious service: Not on file    Active member of club or organization: Not on file    Attends meetings of clubs or organizations: Not on file    Relationship status: Not on file  . Intimate partner violence:    Fear of current or ex partner: Not on file  Emotionally abused: Not on file    Physically abused: Not on file    Forced sexual activity: Not on file  Other Topics Concern  . Not on file  Social History Narrative  . Not on file    FAMILY HISTORY:  Family History  Problem Relation Age of Onset  . Diabetes Father   . Hypertension Unknown     CURRENT MEDICATIONS:  Outpatient Encounter Medications as of 01/14/2018  Medication Sig  . aspirin 81 MG tablet Take 81 mg by mouth daily.    . carvedilol (COREG) 12.5 MG tablet Take 1 tablet (12.5 mg total) by mouth 2 (two) times daily with a meal.  . furosemide (LASIX) 40 MG tablet Take 40 mg ( 1 Tablet)  in the AM and Take 20 mg(1/2 Tablet)  in the PM  . gabapentin (NEURONTIN) 300 MG capsule Take 3 capsules (900 mg total) by mouth daily.  [name] lisinopril (PRINIVIL,ZESTRIL) 20 MG tablet Take 1 tablet (20 mg total) by mouth daily.  . metFORMIN (GLUCOPHAGE) 500 MG tablet Take  500 mg by mouth 2 (two) times daily with a meal.    . NON FORMULARY legatrin pm Daily    . potassium chloride (K-DUR,KLOR-CON) 10 MEQ tablet Take 10 mEq by mouth 2 (two) times daily.  [name] PROAIR HFA 108 (90 BASE) MCG/ACT inhaler Inhale 2 puffs into the lungs 4 (four) times daily.   . simvastatin (ZOCOR) 20 MG tablet Take 1 tablet (20 mg total) by mouth at bedtime.  [name] warfarin (COUMADIN) 5 MG tablet Take 1 tablet daily except 2 tablets on Mondays and Fridays   No facility-administered encounter medications on file as of 01/14/2018.     ALLERGIES:  No Known Allergies   PHYSICAL EXAM:  ECOG Performance status: 1  Vitals:   01/14/18 1052  BP: 132/79  Pulse: (!) 58  Resp: 16  Temp: 97.9 F (36.6 C)  SpO2: 100%   Filed Weights   01/14/18 1052  Weight: 193 lb 6.4 oz (87.7 kg)    Physical Exam Focused exam of the lower extremities shows varicosities, on the right leg.  There is trace edema bilaterally.  There is hyperpigmentation at the site of previous stasis ulcers, above the medial malleoli. LABORATORY DATA:  I have reviewed the labs as listed.  CBC    Component Value Date/Time   WBC 7.9 01/14/2018 0945   RBC 4.61 01/14/2018 0945   HGB 12.7 (L) 01/14/2018 0945   HCT 42.1 01/14/2018 0945   PLT 188 01/14/2018 0945   MCV 91.3 01/14/2018 0945   MCH 27.5 01/14/2018 0945   MCHC 30.2 01/14/2018 0945   RDW 12.8 01/14/2018 0945   LYMPHSABS 2.3 01/14/2018 0945   MONOABS 0.5 01/14/2018 0945   EOSABS 0.3 01/14/2018 0945   BASOSABS 0.0 01/14/2018 0945   CMP Latest Ref Rng & Units 01/14/2018 09/23/2016 09/13/2016  Glucose 65 - 99 mg/dL 109(H) 124(H) 131(H)  BUN 6 - 20 mg/dL 30(H) 18 36(H)  Creatinine 0.61 - 1.24 mg/dL 1.70(H) 1.22 1.71(H)  Sodium 135 - 145 mmol/L 140 137 137  Potassium 3.5 - 5.1 mmol/L 5.0 4.6 4.1  Chloride 101 - 111 mmol/L 105 105 102  CO2 22 - 32 mmol/L 27 26 28   Calcium 8.9 - 10.3 mg/dL 9.3 8.9 9.0  Total Protein 6.5 - 8.1 g/dL 7.7 - -  Total Bilirubin 0.3 -  1.2 mg/dL 0.7 - -  Alkaline Phos 38 - 126 U/L 51 - -  AST 15 - 41  U/L 19 - -  ALT 17 - 63 U/L 16(L) - -         ASSESSMENT & PLAN:   Superficial phlebitis 1.  Superficial phlebitis of the right lower extremity: -he had 3-4 episodes of superficial phlebitis without extension to the deep veins, starting in 2009.  Episodes have not recovered since he is on Coumadin.  He was started on Coumadin for his CVA. -he also had right leg stasis ulcers which healed up nicely.  He is wearing compression stockings. -I have given him the option of following up with his primary care doctor.  He would like to follow-up with [place].  I have given a follow-up visit in 1 year.  2.  Chronic kidney disease: -His creatinine is stable around 1.7.  He is on Lasix and ACE inhibitor which can increase creatinine.      Orders placed this encounter:  Orders Placed This Encounter  Procedures  . CBC with Differential  . Comprehensive metabolic panel      Derek Jack, MD [place] [phone]

## 2018-01-14 NOTE — Assessment & Plan Note (Signed)
1.  Superficial phlebitis of the right lower extremity: -he had 3-4 episodes of superficial phlebitis without extension to the deep veins, starting in 2009.  Episodes have not recovered since he is on Coumadin.  He was started on Coumadin for his CVA. -he also had right leg stasis ulcers which healed up nicely.  He is wearing compression stockings. -I have given him the option of following up with his primary care doctor.  He would like to follow-up with [place].  I have given a follow-up visit in 1 year.  2.  Chronic kidney disease: -His creatinine is stable around 1.7.  He is on Lasix and ACE inhibitor which can increase creatinine.

## 2018-01-14 NOTE — Patient Instructions (Signed)
Hold coumadin tonight then decrease dose to 1 tablet daily except 2 tablets on Mondays  Recheck in 3 weeks

## 2018-02-03 DIAGNOSIS — B351 Tinea unguium: Secondary | ICD-10-CM | POA: Diagnosis not present

## 2018-02-03 DIAGNOSIS — E1142 Type 2 diabetes mellitus with diabetic polyneuropathy: Secondary | ICD-10-CM | POA: Diagnosis not present

## 2018-02-04 ENCOUNTER — Ambulatory Visit (INDEPENDENT_AMBULATORY_CARE_PROVIDER_SITE_OTHER): Payer: Medicare Other | Admitting: *Deleted

## 2018-02-04 DIAGNOSIS — Z5181 Encounter for therapeutic drug level monitoring: Secondary | ICD-10-CM

## 2018-02-04 DIAGNOSIS — I639 Cerebral infarction, unspecified: Secondary | ICD-10-CM | POA: Diagnosis not present

## 2018-02-04 DIAGNOSIS — I6349 Cerebral infarction due to embolism of other cerebral artery: Secondary | ICD-10-CM

## 2018-02-04 DIAGNOSIS — Z7901 Long term (current) use of anticoagulants: Secondary | ICD-10-CM

## 2018-02-04 DIAGNOSIS — Z86718 Personal history of other venous thrombosis and embolism: Secondary | ICD-10-CM | POA: Diagnosis not present

## 2018-02-04 DIAGNOSIS — I6350 Cerebral infarction due to unspecified occlusion or stenosis of unspecified cerebral artery: Secondary | ICD-10-CM | POA: Diagnosis not present

## 2018-02-04 LAB — POCT INR: INR: 2.2 (ref 2.0–3.0)

## 2018-02-04 NOTE — Patient Instructions (Signed)
Continue coumadin 1 tablet daily except 2 tablets on Mondays  Recheck in 4 weeks

## 2018-02-11 DIAGNOSIS — E0822 Diabetes mellitus due to underlying condition with diabetic chronic kidney disease: Secondary | ICD-10-CM | POA: Diagnosis not present

## 2018-02-11 DIAGNOSIS — Z6826 Body mass index (BMI) 26.0-26.9, adult: Secondary | ICD-10-CM | POA: Diagnosis not present

## 2018-02-11 DIAGNOSIS — N183 Chronic kidney disease, stage 3 (moderate): Secondary | ICD-10-CM | POA: Diagnosis not present

## 2018-02-11 DIAGNOSIS — I639 Cerebral infarction, unspecified: Secondary | ICD-10-CM | POA: Diagnosis not present

## 2018-02-11 DIAGNOSIS — E782 Mixed hyperlipidemia: Secondary | ICD-10-CM | POA: Diagnosis not present

## 2018-02-11 DIAGNOSIS — I10 Essential (primary) hypertension: Secondary | ICD-10-CM | POA: Diagnosis not present

## 2018-02-11 DIAGNOSIS — R944 Abnormal results of kidney function studies: Secondary | ICD-10-CM | POA: Diagnosis not present

## 2018-02-11 DIAGNOSIS — R0602 Shortness of breath: Secondary | ICD-10-CM | POA: Diagnosis not present

## 2018-02-11 DIAGNOSIS — I482 Chronic atrial fibrillation: Secondary | ICD-10-CM | POA: Diagnosis not present

## 2018-02-11 DIAGNOSIS — G4733 Obstructive sleep apnea (adult) (pediatric): Secondary | ICD-10-CM | POA: Diagnosis not present

## 2018-02-11 DIAGNOSIS — Z712 Person consulting for explanation of examination or test findings: Secondary | ICD-10-CM | POA: Diagnosis not present

## 2018-02-11 DIAGNOSIS — E119 Type 2 diabetes mellitus without complications: Secondary | ICD-10-CM | POA: Diagnosis not present

## 2018-02-17 DIAGNOSIS — E782 Mixed hyperlipidemia: Secondary | ICD-10-CM | POA: Diagnosis not present

## 2018-02-17 DIAGNOSIS — R197 Diarrhea, unspecified: Secondary | ICD-10-CM | POA: Diagnosis not present

## 2018-02-17 DIAGNOSIS — E875 Hyperkalemia: Secondary | ICD-10-CM | POA: Diagnosis not present

## 2018-02-17 DIAGNOSIS — I1310 Hypertensive heart and chronic kidney disease without heart failure, with stage 1 through stage 4 chronic kidney disease, or unspecified chronic kidney disease: Secondary | ICD-10-CM | POA: Diagnosis not present

## 2018-02-17 DIAGNOSIS — I482 Chronic atrial fibrillation: Secondary | ICD-10-CM | POA: Diagnosis not present

## 2018-02-17 DIAGNOSIS — Z0000 Encounter for general adult medical examination without abnormal findings: Secondary | ICD-10-CM | POA: Diagnosis not present

## 2018-02-17 DIAGNOSIS — E1122 Type 2 diabetes mellitus with diabetic chronic kidney disease: Secondary | ICD-10-CM | POA: Diagnosis not present

## 2018-02-17 DIAGNOSIS — N183 Chronic kidney disease, stage 3 (moderate): Secondary | ICD-10-CM | POA: Diagnosis not present

## 2018-02-17 DIAGNOSIS — I5032 Chronic diastolic (congestive) heart failure: Secondary | ICD-10-CM | POA: Diagnosis not present

## 2018-02-17 DIAGNOSIS — R110 Nausea: Secondary | ICD-10-CM | POA: Diagnosis not present

## 2018-02-17 DIAGNOSIS — I639 Cerebral infarction, unspecified: Secondary | ICD-10-CM | POA: Diagnosis not present

## 2018-02-17 DIAGNOSIS — G4733 Obstructive sleep apnea (adult) (pediatric): Secondary | ICD-10-CM | POA: Diagnosis not present

## 2018-03-02 DIAGNOSIS — Z712 Person consulting for explanation of examination or test findings: Secondary | ICD-10-CM | POA: Diagnosis not present

## 2018-03-02 DIAGNOSIS — E782 Mixed hyperlipidemia: Secondary | ICD-10-CM | POA: Diagnosis not present

## 2018-03-02 DIAGNOSIS — Z6826 Body mass index (BMI) 26.0-26.9, adult: Secondary | ICD-10-CM | POA: Diagnosis not present

## 2018-03-02 DIAGNOSIS — R944 Abnormal results of kidney function studies: Secondary | ICD-10-CM | POA: Diagnosis not present

## 2018-03-02 DIAGNOSIS — R0602 Shortness of breath: Secondary | ICD-10-CM | POA: Diagnosis not present

## 2018-03-02 DIAGNOSIS — E0822 Diabetes mellitus due to underlying condition with diabetic chronic kidney disease: Secondary | ICD-10-CM | POA: Diagnosis not present

## 2018-03-02 DIAGNOSIS — R197 Diarrhea, unspecified: Secondary | ICD-10-CM | POA: Diagnosis not present

## 2018-03-02 DIAGNOSIS — N189 Chronic kidney disease, unspecified: Secondary | ICD-10-CM | POA: Diagnosis not present

## 2018-03-02 DIAGNOSIS — G4733 Obstructive sleep apnea (adult) (pediatric): Secondary | ICD-10-CM | POA: Diagnosis not present

## 2018-03-02 DIAGNOSIS — E1122 Type 2 diabetes mellitus with diabetic chronic kidney disease: Secondary | ICD-10-CM | POA: Diagnosis not present

## 2018-03-02 DIAGNOSIS — E875 Hyperkalemia: Secondary | ICD-10-CM | POA: Diagnosis not present

## 2018-03-02 DIAGNOSIS — N183 Chronic kidney disease, stage 3 (moderate): Secondary | ICD-10-CM | POA: Diagnosis not present

## 2018-03-02 DIAGNOSIS — E119 Type 2 diabetes mellitus without complications: Secondary | ICD-10-CM | POA: Diagnosis not present

## 2018-03-02 DIAGNOSIS — R110 Nausea: Secondary | ICD-10-CM | POA: Diagnosis not present

## 2018-03-02 DIAGNOSIS — I482 Chronic atrial fibrillation: Secondary | ICD-10-CM | POA: Diagnosis not present

## 2018-03-02 DIAGNOSIS — I10 Essential (primary) hypertension: Secondary | ICD-10-CM | POA: Diagnosis not present

## 2018-03-02 DIAGNOSIS — R109 Unspecified abdominal pain: Secondary | ICD-10-CM | POA: Diagnosis not present

## 2018-03-02 DIAGNOSIS — I639 Cerebral infarction, unspecified: Secondary | ICD-10-CM | POA: Diagnosis not present

## 2018-03-04 ENCOUNTER — Ambulatory Visit (INDEPENDENT_AMBULATORY_CARE_PROVIDER_SITE_OTHER): Payer: Medicare Other | Admitting: *Deleted

## 2018-03-04 DIAGNOSIS — Z5181 Encounter for therapeutic drug level monitoring: Secondary | ICD-10-CM | POA: Diagnosis not present

## 2018-03-04 DIAGNOSIS — I639 Cerebral infarction, unspecified: Secondary | ICD-10-CM

## 2018-03-04 DIAGNOSIS — Z86718 Personal history of other venous thrombosis and embolism: Secondary | ICD-10-CM | POA: Diagnosis not present

## 2018-03-04 DIAGNOSIS — Z7901 Long term (current) use of anticoagulants: Secondary | ICD-10-CM | POA: Diagnosis not present

## 2018-03-04 DIAGNOSIS — I6350 Cerebral infarction due to unspecified occlusion or stenosis of unspecified cerebral artery: Secondary | ICD-10-CM

## 2018-03-04 DIAGNOSIS — I6349 Cerebral infarction due to embolism of other cerebral artery: Secondary | ICD-10-CM

## 2018-03-04 LAB — POCT INR: INR: 2.1 (ref 2.0–3.0)

## 2018-03-04 NOTE — Patient Instructions (Signed)
Continue coumadin 1 tablet daily except 2 tablets on Mondays  Recheck in 4 weeks

## 2018-04-01 ENCOUNTER — Ambulatory Visit (INDEPENDENT_AMBULATORY_CARE_PROVIDER_SITE_OTHER): Payer: Medicare Other | Admitting: *Deleted

## 2018-04-01 DIAGNOSIS — I6349 Cerebral infarction due to embolism of other cerebral artery: Secondary | ICD-10-CM | POA: Diagnosis not present

## 2018-04-01 DIAGNOSIS — Z5181 Encounter for therapeutic drug level monitoring: Secondary | ICD-10-CM | POA: Diagnosis not present

## 2018-04-01 DIAGNOSIS — I639 Cerebral infarction, unspecified: Secondary | ICD-10-CM | POA: Diagnosis not present

## 2018-04-01 DIAGNOSIS — Z7901 Long term (current) use of anticoagulants: Secondary | ICD-10-CM | POA: Diagnosis not present

## 2018-04-01 DIAGNOSIS — I6350 Cerebral infarction due to unspecified occlusion or stenosis of unspecified cerebral artery: Secondary | ICD-10-CM

## 2018-04-01 DIAGNOSIS — Z86718 Personal history of other venous thrombosis and embolism: Secondary | ICD-10-CM

## 2018-04-01 LAB — POCT INR: INR: 1.8 — AB (ref 2.0–3.0)

## 2018-04-01 NOTE — Patient Instructions (Signed)
Take coumadin 2 tablets tonight then resume 1 tablet daily except 2 tablets on Mondays  Recheck in 4 weeks

## 2018-04-14 DIAGNOSIS — B351 Tinea unguium: Secondary | ICD-10-CM | POA: Diagnosis not present

## 2018-04-14 DIAGNOSIS — E1142 Type 2 diabetes mellitus with diabetic polyneuropathy: Secondary | ICD-10-CM | POA: Diagnosis not present

## 2018-04-29 ENCOUNTER — Encounter: Payer: Self-pay | Admitting: Cardiology

## 2018-04-29 ENCOUNTER — Ambulatory Visit (INDEPENDENT_AMBULATORY_CARE_PROVIDER_SITE_OTHER): Payer: Medicare Other | Admitting: Cardiology

## 2018-04-29 VITALS — BP 106/60 | HR 64 | Ht 72.0 in | Wt 196.0 lb

## 2018-04-29 DIAGNOSIS — E782 Mixed hyperlipidemia: Secondary | ICD-10-CM

## 2018-04-29 DIAGNOSIS — I5042 Chronic combined systolic (congestive) and diastolic (congestive) heart failure: Secondary | ICD-10-CM

## 2018-04-29 DIAGNOSIS — Z8673 Personal history of transient ischemic attack (TIA), and cerebral infarction without residual deficits: Secondary | ICD-10-CM

## 2018-04-29 DIAGNOSIS — I255 Ischemic cardiomyopathy: Secondary | ICD-10-CM | POA: Diagnosis not present

## 2018-04-29 DIAGNOSIS — I6350 Cerebral infarction due to unspecified occlusion or stenosis of unspecified cerebral artery: Secondary | ICD-10-CM | POA: Diagnosis not present

## 2018-04-29 NOTE — Progress Notes (Signed)
Cardiology Office Note  Date: 04/29/2018   ID: Dennis Yu, DOB 24-Mar-1946, MRN [id number]  PCP: [name], MD  Primary Cardiologist: Rozann Lesches, MD   Chief Complaint  Patient presents with  . Cardiomyopathy    History of Present Illness: Dennis Yu is a 72 y.o. male last seen in March.  He presents today for a routine visit.  States that he feels well at this point.  No reported angina, palpitations, or syncope.  He describes NYHA class II dyspnea.  He does mention that within the last 6 months he was having trouble with weight loss and loose stools, lost about 15 pounds and had lower blood pressures in general.  He has followed with his PCP, underwent medication adjustments for his diabetes and also was switched from ACE inhibitor to ARB.  With current blood pressure today he has no symptoms.  His weight has also stabilized.  He follows in the anticoagulation clinic on Coumadin. Last INR was 1.8.  We continue with conservative cardiac management at his request, no invasive testing planned.  Previous noninvasive ischemic imaging revealed dense scar in the inferoposterior wall as well as anteroseptal distribution.  Mild to moderate peri-infarct ischemia was noted periapically.  Past Medical History:  Diagnosis Date  . Bronchitis 11/2012  . Cardiomyopathy    LVEF 25%  . Diabetes mellitus   . Erectile dysfunction   . Essential hypertension, benign   . GERD (gastroesophageal reflux disease)   . History of colonic polyps   . Mixed hyperlipidemia   . 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 Obstructive sleep apnea   . Patent foramen ovale   . Phlebitis   . 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 Superficial phlebitis 12/10/2012  . Type 2 diabetes mellitus ([place])   . Vitamin D deficiency     History reviewed. No pertinent surgical history.  Current Outpatient Medications  Medication Sig Dispense Refill  . aspirin 81 MG tablet Take 81 mg by  mouth daily.      . carvedilol (COREG) 12.5 MG tablet Take 1 tablet (12.5 mg total) by mouth 2 (two) times daily with a meal. 180 tablet 3  . furosemide (LASIX) 40 MG tablet Take 40 mg ( 1 Tablet)  in the AM and Take 20 mg(1/2 Tablet)  in the PM 135 tablet 3  . gabapentin (NEURONTIN) 300 MG capsule Take 3 capsules (900 mg total) by mouth daily. 90 capsule 11  . metFORMIN (GLUCOPHAGE-XR) 500 MG 24 hr tablet     . NON FORMULARY legatrin pm Daily      . olmesartan (BENICAR) 20 MG tablet Take 20 mg by mouth daily.    [name] PROAIR HFA 108 (90 BASE) MCG/ACT inhaler Inhale 2 puffs into the lungs 4 (four) times daily.     . simvastatin (ZOCOR) 40 MG tablet     . warfarin (COUMADIN) 5 MG tablet Take 1 tablet daily except 2 tablets on Mondays and Fridays 90 tablet 3   No current facility-administered medications for this visit.    Allergies:  Patient has no known allergies.   Social History: The patient  reports that he quit smoking about 14 years ago. His smoking use included cigarettes. He started smoking about 50 years ago. He has never used smokeless tobacco. He reports that he does not drink alcohol or use drugs.   ROS:  Please see the history of present illness. Otherwise, complete review  of systems is positive for hearing loss, arthritic stiffness.  All other systems are reviewed and negative.   Physical Exam: VS:  BP 106/60 (BP Location: Left Arm)   Pulse 64   Ht 6' (1.829 m)   Wt 196 lb (88.9 kg)   SpO2 95%   BMI 26.58 kg/m , BMI Body mass index is 26.58 kg/m.  Wt Readings from Last 3 Encounters:  04/29/18 196 lb (88.9 kg)  01/14/18 193 lb 6.4 oz (87.7 kg)  10/27/17 195 lb (88.5 kg)    General: Patient appears comfortable at rest. HEENT: Conjunctiva and lids normal, oropharynx clear. Neck: Supple, no elevated JVP or carotid bruits, no thyromegaly. Lungs: Coarse breath sounds with scattered rhonchi at the bases, nonlabored breathing at rest. Cardiac: Regular rate and rhythm, no S3,  2/6 systolic murmur. Abdomen: Soft, nontender, bowel sounds present. Extremities: No pitting edema, distal pulses 2+. Skin: Warm and dry. Musculoskeletal: No kyphosis. Neuropsychiatric: Alert and oriented x3, affect grossly appropriate.  ECG: I personally reviewed the tracing from 04/15/2017 which showed sinus rhythm with old anterior and inferior infarct pattern.  Recent Labwork: 01/14/2018: ALT 16; AST 19; BUN 30; Creatinine, Ser 1.70; Hemoglobin 12.7; Platelets 188; Potassium 5.0; Sodium 140     Component Value Date/Time   CHOL 145 04/05/2011 0545   TRIG 66 04/05/2011 0545   HDL 40 04/05/2011 0545   CHOLHDL 3.6 04/05/2011 0545   VLDL 13 04/05/2011 0545   LDLCALC 92 04/05/2011 0545    Other Studies Reviewed Today:  Echocardiogram 04/10/2016: Study Conclusions  - Left ventricle: The cavity size was mildly dilated. Wall thickness was increased in a pattern of moderate LVH. Systolic function was severely reduced. The estimated ejection fraction was in the range of 25% to 30%. Diffuse hypokinesis. Noncompacted appearance of the apical and distal anterolateral myocardium. Doppler parameters are consistent with abnormal left ventricular relaxation (grade 1 diastolic dysfunction). - Regional wall motion abnormality: Akinesis of the apical anterior, apical lateral, and apical myocardium; severe hypokinesis of the mid anterolateral myocardium. - Aortic valve: Trileaflet; mildly thickened leaflets. - Aorta: Mild aortic root dilatation. Aortic root dimension: 42 mm (ED). - Mitral valve: Mildly thickened leaflets . There was mild regurgitation. - Left atrium: The atrium was mildly dilated. - Right ventricle: Systolic function was mildly reduced. - Atrial septum: No defect or patent foramen ovale was identified. - Tricuspid valve: Mildly thickened leaflets.  Assessment and Plan:  1.  Chronic systolic heart failure, currently stable in terms of symptoms.  Present  regimen includes Coreg, Benicar, and Lasix.  No changes were made today.  2.  Ischemic cardiomyopathy with LVEF 25 to 30%.  He prefers conservative management, no active angina at this point.  Continue aspirin and statin.  3.  Mixed hyperlipidemia, remains on Zocor with follow-up per PCP.  4.  History of stroke and inconsistent documentation of PFO.  He remains on Coumadin with follow-up in the anticoagulation clinic.  Current medicines were reviewed with the patient today.   Orders Placed This Encounter  Procedures  . EKG 12-Lead    Disposition: Follow-up in 6 months.  Signed, Satira Sark, MD, [facility] 04/29/2018 9:54 AM    [facility] [place]. [address] [place], [place] [address] Phone: [phone]; Fax: [phone]

## 2018-04-29 NOTE — Patient Instructions (Addendum)
Your physician wants you to follow-up in:6 months  with Dr.McDowell You will receive a reminder letter in the mail two months in advance. If you don't receive a letter, please call our office to schedule the follow-up appointment.     Your physician recommends that you continue on your current medications as directed. Please refer to the Current Medication list given to you today.    If you need a refill on your cardiac medications before your next appointment, please call your pharmacy.     No Lab work or tests ordered today.     Thank you for choosing [facility] !

## 2018-05-01 ENCOUNTER — Ambulatory Visit (INDEPENDENT_AMBULATORY_CARE_PROVIDER_SITE_OTHER): Payer: Medicare Other | Admitting: *Deleted

## 2018-05-01 DIAGNOSIS — Z5181 Encounter for therapeutic drug level monitoring: Secondary | ICD-10-CM

## 2018-05-01 DIAGNOSIS — I639 Cerebral infarction, unspecified: Secondary | ICD-10-CM

## 2018-05-01 DIAGNOSIS — I6350 Cerebral infarction due to unspecified occlusion or stenosis of unspecified cerebral artery: Secondary | ICD-10-CM | POA: Diagnosis not present

## 2018-05-01 DIAGNOSIS — Z86718 Personal history of other venous thrombosis and embolism: Secondary | ICD-10-CM | POA: Diagnosis not present

## 2018-05-01 DIAGNOSIS — Z7901 Long term (current) use of anticoagulants: Secondary | ICD-10-CM | POA: Diagnosis not present

## 2018-05-01 LAB — POCT INR: INR: 1.9 — AB (ref 2.0–3.0)

## 2018-05-01 NOTE — Patient Instructions (Signed)
Increase coumadin to 1 tablet daily except 2 tablets on Mondays and Fridays Recheck in 3 weeks

## 2018-05-25 ENCOUNTER — Ambulatory Visit (INDEPENDENT_AMBULATORY_CARE_PROVIDER_SITE_OTHER): Payer: Medicare Other | Admitting: *Deleted

## 2018-05-25 DIAGNOSIS — Z7901 Long term (current) use of anticoagulants: Secondary | ICD-10-CM

## 2018-05-25 DIAGNOSIS — I6350 Cerebral infarction due to unspecified occlusion or stenosis of unspecified cerebral artery: Secondary | ICD-10-CM

## 2018-05-25 DIAGNOSIS — Z5181 Encounter for therapeutic drug level monitoring: Secondary | ICD-10-CM

## 2018-05-25 DIAGNOSIS — Z86718 Personal history of other venous thrombosis and embolism: Secondary | ICD-10-CM

## 2018-05-25 DIAGNOSIS — I639 Cerebral infarction, unspecified: Secondary | ICD-10-CM

## 2018-05-25 LAB — POCT INR: INR: 2.9 (ref 2.0–3.0)

## 2018-05-25 MED ORDER — WARFARIN SODIUM 5 MG PO TABS: ORAL_TABLET | ORAL | 3 refills | Status: DC

## 2018-05-25 NOTE — Addendum Note (Signed)
Addended by: Malen Gauze on: 05/25/2018 02:53 PM   Modules accepted: Level of Service

## 2018-05-25 NOTE — Patient Instructions (Signed)
Continue coumadin 1 tablet daily except 2 tablets on Mondays and Fridays Recheck in 4 weeks

## 2018-05-31 ENCOUNTER — Other Ambulatory Visit: Payer: Self-pay | Admitting: Cardiovascular Disease

## 2018-06-22 ENCOUNTER — Ambulatory Visit (INDEPENDENT_AMBULATORY_CARE_PROVIDER_SITE_OTHER): Payer: Medicare Other | Admitting: *Deleted

## 2018-06-22 DIAGNOSIS — Z5181 Encounter for therapeutic drug level monitoring: Secondary | ICD-10-CM

## 2018-06-22 DIAGNOSIS — Z7901 Long term (current) use of anticoagulants: Secondary | ICD-10-CM | POA: Diagnosis not present

## 2018-06-22 DIAGNOSIS — I639 Cerebral infarction, unspecified: Secondary | ICD-10-CM

## 2018-06-22 DIAGNOSIS — Z86718 Personal history of other venous thrombosis and embolism: Secondary | ICD-10-CM | POA: Diagnosis not present

## 2018-06-22 DIAGNOSIS — I6350 Cerebral infarction due to unspecified occlusion or stenosis of unspecified cerebral artery: Secondary | ICD-10-CM | POA: Diagnosis not present

## 2018-06-22 LAB — POCT INR: INR: 3 (ref 2.0–3.0)

## 2018-06-22 NOTE — Patient Instructions (Signed)
Continue coumadin 1 tablet daily except 2 tablets on Mondays and Fridays Recheck in 6 weeks 

## 2018-06-23 DIAGNOSIS — E1142 Type 2 diabetes mellitus with diabetic polyneuropathy: Secondary | ICD-10-CM | POA: Diagnosis not present

## 2018-06-23 DIAGNOSIS — B351 Tinea unguium: Secondary | ICD-10-CM | POA: Diagnosis not present

## 2018-06-29 DIAGNOSIS — E1122 Type 2 diabetes mellitus with diabetic chronic kidney disease: Secondary | ICD-10-CM | POA: Diagnosis not present

## 2018-06-29 DIAGNOSIS — E119 Type 2 diabetes mellitus without complications: Secondary | ICD-10-CM | POA: Diagnosis not present

## 2018-06-29 DIAGNOSIS — N183 Chronic kidney disease, stage 3 (moderate): Secondary | ICD-10-CM | POA: Diagnosis not present

## 2018-06-29 DIAGNOSIS — I10 Essential (primary) hypertension: Secondary | ICD-10-CM | POA: Diagnosis not present

## 2018-06-29 DIAGNOSIS — E782 Mixed hyperlipidemia: Secondary | ICD-10-CM | POA: Diagnosis not present

## 2018-07-06 DIAGNOSIS — E782 Mixed hyperlipidemia: Secondary | ICD-10-CM | POA: Diagnosis not present

## 2018-07-06 DIAGNOSIS — E1122 Type 2 diabetes mellitus with diabetic chronic kidney disease: Secondary | ICD-10-CM | POA: Diagnosis not present

## 2018-07-06 DIAGNOSIS — G9009 Other idiopathic peripheral autonomic neuropathy: Secondary | ICD-10-CM | POA: Diagnosis not present

## 2018-07-06 DIAGNOSIS — I5032 Chronic diastolic (congestive) heart failure: Secondary | ICD-10-CM | POA: Diagnosis not present

## 2018-07-06 DIAGNOSIS — G4733 Obstructive sleep apnea (adult) (pediatric): Secondary | ICD-10-CM | POA: Diagnosis not present

## 2018-07-06 DIAGNOSIS — Z23 Encounter for immunization: Secondary | ICD-10-CM | POA: Diagnosis not present

## 2018-07-06 DIAGNOSIS — I129 Hypertensive chronic kidney disease with stage 1 through stage 4 chronic kidney disease, or unspecified chronic kidney disease: Secondary | ICD-10-CM | POA: Diagnosis not present

## 2018-07-06 DIAGNOSIS — I482 Chronic atrial fibrillation, unspecified: Secondary | ICD-10-CM | POA: Diagnosis not present

## 2018-07-06 DIAGNOSIS — N183 Chronic kidney disease, stage 3 (moderate): Secondary | ICD-10-CM | POA: Diagnosis not present

## 2018-08-03 ENCOUNTER — Ambulatory Visit (INDEPENDENT_AMBULATORY_CARE_PROVIDER_SITE_OTHER): Payer: Medicare Other | Admitting: *Deleted

## 2018-08-03 DIAGNOSIS — I639 Cerebral infarction, unspecified: Secondary | ICD-10-CM

## 2018-08-03 DIAGNOSIS — Z7901 Long term (current) use of anticoagulants: Secondary | ICD-10-CM

## 2018-08-03 DIAGNOSIS — I6350 Cerebral infarction due to unspecified occlusion or stenosis of unspecified cerebral artery: Secondary | ICD-10-CM | POA: Diagnosis not present

## 2018-08-03 DIAGNOSIS — Z5181 Encounter for therapeutic drug level monitoring: Secondary | ICD-10-CM | POA: Diagnosis not present

## 2018-08-03 DIAGNOSIS — Z86718 Personal history of other venous thrombosis and embolism: Secondary | ICD-10-CM

## 2018-08-03 LAB — POCT INR: INR: 2.5 (ref 2.0–3.0)

## 2018-08-03 NOTE — Patient Instructions (Signed)
Continue coumadin 1 tablet daily except 2 tablets on Mondays and Fridays Recheck in 6 weeks 

## 2018-09-01 DIAGNOSIS — B351 Tinea unguium: Secondary | ICD-10-CM | POA: Diagnosis not present

## 2018-09-01 DIAGNOSIS — E1142 Type 2 diabetes mellitus with diabetic polyneuropathy: Secondary | ICD-10-CM | POA: Diagnosis not present

## 2018-09-06 ENCOUNTER — Other Ambulatory Visit: Payer: Self-pay | Admitting: Cardiovascular Disease

## 2018-09-14 ENCOUNTER — Ambulatory Visit (INDEPENDENT_AMBULATORY_CARE_PROVIDER_SITE_OTHER): Payer: Medicare Other | Admitting: Pharmacist

## 2018-09-14 DIAGNOSIS — Z86718 Personal history of other venous thrombosis and embolism: Secondary | ICD-10-CM | POA: Diagnosis not present

## 2018-09-14 DIAGNOSIS — Z7901 Long term (current) use of anticoagulants: Secondary | ICD-10-CM

## 2018-09-14 DIAGNOSIS — I6349 Cerebral infarction due to embolism of other cerebral artery: Secondary | ICD-10-CM

## 2018-09-14 DIAGNOSIS — I639 Cerebral infarction, unspecified: Secondary | ICD-10-CM | POA: Diagnosis not present

## 2018-09-14 DIAGNOSIS — I6350 Cerebral infarction due to unspecified occlusion or stenosis of unspecified cerebral artery: Secondary | ICD-10-CM

## 2018-09-14 DIAGNOSIS — Z5181 Encounter for therapeutic drug level monitoring: Secondary | ICD-10-CM | POA: Diagnosis not present

## 2018-09-14 LAB — POCT INR: INR: 2.3 (ref 2.0–3.0)

## 2018-09-14 NOTE — Patient Instructions (Signed)
Description   Continue coumadin 1 tablet daily except 2 tablets on Mondays and Fridays Recheck in 6 weeks

## 2018-10-22 ENCOUNTER — Telehealth: Payer: Self-pay | Admitting: *Deleted

## 2018-10-22 NOTE — Telephone Encounter (Signed)
1. Have you recently travelled abroad or to [place], [place], or [place]? NO 2. Do you currently have a fever? NO  3. Have you been in contact with someone that is currently pending confirmation of COVID19 testing or has been confirmed to have the COVID19 virus? NO 4. Are you currently experiencing fatigue or cough? NO 5. Are you currently experiencing new or worsening shortness of breath at rest or with minimal activity? NO 6. Have you been in contact with someone that was recently sick with fever/cough/fatigue? NO   **A score of 4 or more should result in cancellation of the pts cardiology appt  **A score of 2 should be provided a mask prior to admission into the lobby  **TRAVEL to a high risk area or contact with a confirmed case should stay at home, away from confirmed patient, monitor symptoms, and reach out to PCP for evisit, additional testing.   **ALL PTS WITH FEVER SHOULD BE REFERRED TO PCP FOR EVISIT  Pt. Advised that we are restricting visitors at this time and request that only patients present for check-in prior to their appointment. All other visitors should remain in their car. If necessary, only one visitor may come with the patient into the building. For everyone's safety, all patients and visitors entering our practice area should expect to be screened again prior to entering our waiting area.    PT COMING FOR APPT

## 2018-10-26 ENCOUNTER — Telehealth: Payer: Self-pay | Admitting: Physician Assistant

## 2018-10-26 NOTE — Telephone Encounter (Signed)
I called patient about his upcoming appointment 10/29/2018 with Dr. Domenic Polite due to the coronavirus outbreak.  Patient is not having any problems and is happy to reschedule.  Please make an appointment September 2020.

## 2018-10-29 ENCOUNTER — Ambulatory Visit: Payer: Medicare Other | Admitting: Cardiology

## 2018-11-13 ENCOUNTER — Telehealth: Payer: Self-pay | Admitting: *Deleted

## 2018-11-13 NOTE — Telephone Encounter (Signed)
° °  COVID-19 Pre-Screening Questions: ° °• Do you currently have a fever?NO ° ° °• Have you recently travelled on a cruise, internationally, or to [place], [place], [place], [place], [place], or [place], [place] ([employer]) ? NO °•  °• Have you been in contact with someone that is currently pending confirmation of Covid19 testing or has been confirmed to have the Covid19 virus?  NO °•  °Are you currently experiencing fatigue or cough? NO ° ° °   ° ° ° ° °

## 2018-11-16 ENCOUNTER — Ambulatory Visit (INDEPENDENT_AMBULATORY_CARE_PROVIDER_SITE_OTHER): Payer: Medicare Other | Admitting: *Deleted

## 2018-11-16 DIAGNOSIS — Z86718 Personal history of other venous thrombosis and embolism: Secondary | ICD-10-CM | POA: Diagnosis not present

## 2018-11-16 DIAGNOSIS — Z7901 Long term (current) use of anticoagulants: Secondary | ICD-10-CM

## 2018-11-16 DIAGNOSIS — Z5181 Encounter for therapeutic drug level monitoring: Secondary | ICD-10-CM

## 2018-11-16 DIAGNOSIS — I639 Cerebral infarction, unspecified: Secondary | ICD-10-CM

## 2018-11-16 DIAGNOSIS — I6350 Cerebral infarction due to unspecified occlusion or stenosis of unspecified cerebral artery: Secondary | ICD-10-CM

## 2018-11-16 LAB — POCT INR: INR: 2.4 (ref 2.0–3.0)

## 2018-11-16 NOTE — Patient Instructions (Signed)
Continue coumadin 1 tablet daily except 2 tablets on Mondays and Fridays Recheck in 6 weeks

## 2018-12-15 DIAGNOSIS — N183 Chronic kidney disease, stage 3 (moderate): Secondary | ICD-10-CM | POA: Diagnosis not present

## 2018-12-15 DIAGNOSIS — I5032 Chronic diastolic (congestive) heart failure: Secondary | ICD-10-CM | POA: Diagnosis not present

## 2018-12-15 DIAGNOSIS — E1122 Type 2 diabetes mellitus with diabetic chronic kidney disease: Secondary | ICD-10-CM | POA: Diagnosis not present

## 2018-12-15 DIAGNOSIS — E782 Mixed hyperlipidemia: Secondary | ICD-10-CM | POA: Diagnosis not present

## 2018-12-15 DIAGNOSIS — I10 Essential (primary) hypertension: Secondary | ICD-10-CM | POA: Diagnosis not present

## 2018-12-31 ENCOUNTER — Ambulatory Visit (INDEPENDENT_AMBULATORY_CARE_PROVIDER_SITE_OTHER): Payer: Medicare Other | Admitting: *Deleted

## 2018-12-31 DIAGNOSIS — I6349 Cerebral infarction due to embolism of other cerebral artery: Secondary | ICD-10-CM

## 2018-12-31 DIAGNOSIS — Z5181 Encounter for therapeutic drug level monitoring: Secondary | ICD-10-CM

## 2018-12-31 DIAGNOSIS — Z86718 Personal history of other venous thrombosis and embolism: Secondary | ICD-10-CM

## 2018-12-31 DIAGNOSIS — Z7901 Long term (current) use of anticoagulants: Secondary | ICD-10-CM | POA: Diagnosis not present

## 2018-12-31 DIAGNOSIS — I639 Cerebral infarction, unspecified: Secondary | ICD-10-CM

## 2018-12-31 DIAGNOSIS — I6350 Cerebral infarction due to unspecified occlusion or stenosis of unspecified cerebral artery: Secondary | ICD-10-CM | POA: Diagnosis not present

## 2018-12-31 LAB — POCT INR: INR: 2.2 (ref 2.0–3.0)

## 2018-12-31 NOTE — Patient Instructions (Signed)
Continue coumadin 1 tablet daily except 2 tablets on Mondays and Fridays Recheck in 6 weeks

## 2019-01-06 DIAGNOSIS — N183 Chronic kidney disease, stage 3 (moderate): Secondary | ICD-10-CM | POA: Diagnosis not present

## 2019-01-06 DIAGNOSIS — E119 Type 2 diabetes mellitus without complications: Secondary | ICD-10-CM | POA: Diagnosis not present

## 2019-01-06 DIAGNOSIS — I10 Essential (primary) hypertension: Secondary | ICD-10-CM | POA: Diagnosis not present

## 2019-01-06 DIAGNOSIS — E782 Mixed hyperlipidemia: Secondary | ICD-10-CM | POA: Diagnosis not present

## 2019-01-06 DIAGNOSIS — E1122 Type 2 diabetes mellitus with diabetic chronic kidney disease: Secondary | ICD-10-CM | POA: Diagnosis not present

## 2019-01-11 DIAGNOSIS — N183 Chronic kidney disease, stage 3 (moderate): Secondary | ICD-10-CM | POA: Diagnosis not present

## 2019-01-11 DIAGNOSIS — I5032 Chronic diastolic (congestive) heart failure: Secondary | ICD-10-CM | POA: Diagnosis not present

## 2019-01-11 DIAGNOSIS — E1122 Type 2 diabetes mellitus with diabetic chronic kidney disease: Secondary | ICD-10-CM | POA: Diagnosis not present

## 2019-01-11 DIAGNOSIS — E782 Mixed hyperlipidemia: Secondary | ICD-10-CM | POA: Diagnosis not present

## 2019-01-11 DIAGNOSIS — I129 Hypertensive chronic kidney disease with stage 1 through stage 4 chronic kidney disease, or unspecified chronic kidney disease: Secondary | ICD-10-CM | POA: Diagnosis not present

## 2019-01-11 DIAGNOSIS — G4733 Obstructive sleep apnea (adult) (pediatric): Secondary | ICD-10-CM | POA: Diagnosis not present

## 2019-01-11 DIAGNOSIS — I482 Chronic atrial fibrillation, unspecified: Secondary | ICD-10-CM | POA: Diagnosis not present

## 2019-01-11 DIAGNOSIS — G9009 Other idiopathic peripheral autonomic neuropathy: Secondary | ICD-10-CM | POA: Diagnosis not present

## 2019-01-11 DIAGNOSIS — Z8673 Personal history of transient ischemic attack (TIA), and cerebral infarction without residual deficits: Secondary | ICD-10-CM | POA: Diagnosis not present

## 2019-01-14 ENCOUNTER — Other Ambulatory Visit (HOSPITAL_COMMUNITY): Payer: Self-pay | Admitting: Emergency Medicine

## 2019-01-14 DIAGNOSIS — I8001 Phlebitis and thrombophlebitis of superficial vessels of right lower extremity: Secondary | ICD-10-CM

## 2019-01-15 ENCOUNTER — Other Ambulatory Visit (HOSPITAL_COMMUNITY): Payer: Medicare Other

## 2019-01-15 ENCOUNTER — Inpatient Hospital Stay (HOSPITAL_BASED_OUTPATIENT_CLINIC_OR_DEPARTMENT_OTHER): Payer: Medicare Other | Admitting: Nurse Practitioner

## 2019-01-15 ENCOUNTER — Ambulatory Visit (HOSPITAL_COMMUNITY): Payer: Medicare Other | Admitting: Nurse Practitioner

## 2019-01-15 ENCOUNTER — Other Ambulatory Visit: Payer: Self-pay

## 2019-01-15 ENCOUNTER — Other Ambulatory Visit (HOSPITAL_COMMUNITY): Payer: Self-pay | Admitting: Nurse Practitioner

## 2019-01-15 ENCOUNTER — Inpatient Hospital Stay (HOSPITAL_COMMUNITY): Payer: Medicare Other | Attending: Hematology

## 2019-01-15 DIAGNOSIS — I803 Phlebitis and thrombophlebitis of lower extremities, unspecified: Secondary | ICD-10-CM | POA: Diagnosis not present

## 2019-01-15 DIAGNOSIS — R197 Diarrhea, unspecified: Secondary | ICD-10-CM

## 2019-01-15 DIAGNOSIS — R5383 Other fatigue: Secondary | ICD-10-CM | POA: Insufficient documentation

## 2019-01-15 DIAGNOSIS — N189 Chronic kidney disease, unspecified: Secondary | ICD-10-CM | POA: Diagnosis not present

## 2019-01-15 DIAGNOSIS — Z87891 Personal history of nicotine dependence: Secondary | ICD-10-CM | POA: Insufficient documentation

## 2019-01-15 DIAGNOSIS — Z8673 Personal history of transient ischemic attack (TIA), and cerebral infarction without residual deficits: Secondary | ICD-10-CM

## 2019-01-15 DIAGNOSIS — I8001 Phlebitis and thrombophlebitis of superficial vessels of right lower extremity: Secondary | ICD-10-CM

## 2019-01-15 DIAGNOSIS — D509 Iron deficiency anemia, unspecified: Secondary | ICD-10-CM | POA: Insufficient documentation

## 2019-01-15 DIAGNOSIS — I809 Phlebitis and thrombophlebitis of unspecified site: Secondary | ICD-10-CM

## 2019-01-15 LAB — CBC WITH DIFFERENTIAL/PLATELET
Abs Immature Granulocytes: 0.01 10*3/uL (ref 0.00–0.07)
Basophils Absolute: 0 10*3/uL (ref 0.0–0.1)
Basophils Relative: 1 %
Eosinophils Absolute: 0.3 10*3/uL (ref 0.0–0.5)
Eosinophils Relative: 5 %
HCT: 39.1 % (ref 39.0–52.0)
Hemoglobin: 12.1 g/dL — ABNORMAL LOW (ref 13.0–17.0)
Immature Granulocytes: 0 %
Lymphocytes Relative: 23 %
Lymphs Abs: 1.5 10*3/uL (ref 0.7–4.0)
MCH: 28 pg (ref 26.0–34.0)
MCHC: 30.9 g/dL (ref 30.0–36.0)
MCV: 90.5 fL (ref 80.0–100.0)
Monocytes Absolute: 0.6 10*3/uL (ref 0.1–1.0)
Monocytes Relative: 8 %
Neutro Abs: 4.3 10*3/uL (ref 1.7–7.7)
Neutrophils Relative %: 63 %
Platelets: 197 10*3/uL (ref 150–400)
RBC: 4.32 MIL/uL (ref 4.22–5.81)
RDW: 13.2 % (ref 11.5–15.5)
WBC: 6.8 10*3/uL (ref 4.0–10.5)
nRBC: 0 % (ref 0.0–0.2)

## 2019-01-15 LAB — COMPREHENSIVE METABOLIC PANEL
ALT: 17 U/L (ref 0–44)
AST: 21 U/L (ref 15–41)
Albumin: 4 g/dL (ref 3.5–5.0)
Alkaline Phosphatase: 45 U/L (ref 38–126)
Anion gap: 10 (ref 5–15)
BUN: 33 mg/dL — ABNORMAL HIGH (ref 8–23)
CO2: 24 mmol/L (ref 22–32)
Calcium: 9.2 mg/dL (ref 8.9–10.3)
Chloride: 106 mmol/L (ref 98–111)
Creatinine, Ser: 1.58 mg/dL — ABNORMAL HIGH (ref 0.61–1.24)
GFR calc Af Amer: 50 mL/min — ABNORMAL LOW (ref >60)
GFR calc non Af Amer: 43 mL/min — ABNORMAL LOW (ref >60)
Glucose, Bld: 110 mg/dL — ABNORMAL HIGH (ref 70–99)
Potassium: 4.5 mmol/L (ref 3.5–5.1)
Sodium: 140 mmol/L (ref 135–145)
Total Bilirubin: 0.5 mg/dL (ref 0.3–1.2)
Total Protein: 7.3 g/dL (ref 6.5–8.1)

## 2019-01-15 LAB — IRON AND TIBC
Iron: 68 ug/dL (ref 45–182)
Saturation Ratios: 19 % (ref 17.9–39.5)
TIBC: 352 ug/dL (ref 250–450)
UIBC: 284 ug/dL

## 2019-01-15 LAB — VITAMIN B12: Vitamin B-12: 478 pg/mL (ref 180–914)

## 2019-01-15 LAB — FERRITIN: Ferritin: 48 ng/mL (ref 24–336)

## 2019-01-15 NOTE — Assessment & Plan Note (Signed)
1.  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 -He reports increased fatigue over the past few months. - He has never tried oral iron in the past. - We have placed him on 1 tab daily. - Labs on 01/15/2019 showed his hemoglobin 12.1, ferritin 48, percent saturation 19 - We will follow-up in 3 months with repeat labs.

## 2019-01-15 NOTE — Assessment & Plan Note (Addendum)
1.  Superficial phlebitis of the right lower extremity: -She had 3-4 episodes of superficial phlebitis without extension to the deep veins starting 2009. - He was started on Coumadin for his CVA.  He has not had any episodes of phlebitis since starting on Coumadin. -He also had a right leg stasis ulcer which healed up nicely.  He has been wearing his compression stockings. -Labs on 01/15/2019 showed his potassium 4.5, creatinine 1.58, LFTs WNL, WBC 6.8, hemoglobin 12.1, platelets 197. -Upon physical assessment he had no venous ulcers or redness or swelling on his legs. - He will follow-up in 1 year with repeat labs.  2.  Chronic kidney disease: -His creatinine is stable around 1.7. -Labs on 01/15/2019 showed his creatinine 1.58. -He is on Lasix and ACE inhibitor which can increase her creatinine. -His primary care doctor will continue to monitor. - He does report that he is more fatigued than normal his hemoglobin was 12.1 which is trending down.  We will check an iron panel on him and call him with his results on Tuesday.

## 2019-01-15 NOTE — Patient Instructions (Signed)
[facility] at [facility] Discharge Instructions     Thank you for choosing [facility] at [facility] to provide your oncology and hematology care.  To afford each patient quality time with our provider, please arrive at least 15 minutes before your scheduled appointment time.   If you have a lab appointment with the Cancer Center please come in thru the  Main Entrance and check in at the main information desk  You need to re-schedule your appointment should you arrive 10 or more minutes late.  We strive to give you quality time with our providers, and arriving late affects you and other patients whose appointments are after yours.  Also, if you no show three or more times for appointments you may be dismissed from the clinic at the providers discretion.     Again, thank you for choosing [facility].  Our hope is that these requests will decrease the amount of time that you wait before being seen by our physicians.       _____________________________________________________________  Should you have questions after your visit to [facility], please contact our office at [phone] between the hours of 8:00 a.m. and 4:30 p.m.  Voicemails left after 4:00 p.m. will not be returned until the following business day.  For prescription refill requests, have your pharmacy contact our office and allow 72 hours.    Cancer Center Support Programs:   > Cancer Support Group  2nd Tuesday of the month 1pm-2pm, Journey Room    

## 2019-01-15 NOTE — Progress Notes (Signed)
[place], [place] [address]   CLINIC:  Medical Oncology/Hematology  PCP:  [name], MD [place] [address] [phone]   REASON FOR VISIT: Follow-up for superficial phlebitis  CURRENT THERAPY: Observation   INTERVAL HISTORY:  Dennis Yu 73 y.o. male returns for routine follow-up for superficial phlebitis.  He reports he has had no more episodes of phlebitis on his legs.  No redness swelling or sores.  He does report he has been more fatigued than normal.  We will check an iron panel on him today.  He does report having episodes of diarrhea. Denies any nausea, vomiting, or diarrhea. Denies any new pains. Had not noticed any recent bleeding such as epistaxis, hematuria or hematochezia. Denies recent chest pain on exertion, shortness of breath on minimal exertion, pre-syncopal episodes, or palpitations. Denies any numbness or tingling in hands or feet. Denies any recent fevers, infections, or recent hospitalizations. Patient reports appetite at 75% and energy level at 0%.  He is eating well and maintaining his weight at this time.    REVIEW OF SYSTEMS:  Review of Systems  Constitutional: Positive for fatigue.  Gastrointestinal: Positive for diarrhea.  All other systems reviewed and are negative.    PAST MEDICAL/SURGICAL HISTORY:  Past Medical History:  Diagnosis Date  . Bronchitis 11/2012  . Cardiomyopathy    LVEF 25%  . Diabetes mellitus   . Erectile dysfunction   . Essential hypertension, benign   . GERD (gastroesophageal reflux disease)   . History of colonic polyps   . Mixed hyperlipidemia   . 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 Obstructive sleep apnea   . Patent foramen ovale   . Phlebitis   . 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 Superficial phlebitis 12/10/2012  . Type 2 diabetes mellitus ([place])   . Vitamin D deficiency    No past surgical history on file.   SOCIAL HISTORY:   Social History   Socioeconomic History  . Marital status: Married    Spouse name: Not on file  . Number of children: Not on file  . Years of education: Not on file  . Highest education level: Not on file  Occupational History  . Occupation: Truck Education administrator: RETIRED  Social Needs  . Financial resource strain: Not on file  . Food insecurity    Worry: Not on file    Inability: Not on file  . Transportation needs    Medical: Not on file    Non-medical: Not on file  Tobacco Use  . Smoking status: Former Smoker    Types: Cigarettes    Start date: 08/06/1967    Quit date: 08/06/2003    Years since quitting: 15.4  . Smokeless tobacco: Never Used  Substance and Sexual Activity  . Alcohol use: No    Alcohol/week: 0.0 standard drinks  . Drug use: No  . Sexual activity: Not Currently  Lifestyle  . Physical activity    Days per week: Not on file    Minutes per session: Not on file  . Stress: Not on file  Relationships  . Social Herbalist on phone: Not on file    Gets together: Not on file    Attends religious service: Not on file    Active member of club or organization: Not on file    Attends meetings of clubs or  organizations: Not on file    Relationship status: Not on file  . Intimate partner violence    Fear of current or ex partner: Not on file    Emotionally abused: Not on file    Physically abused: Not on file    Forced sexual activity: Not on file  Other Topics Concern  . Not on file  Social History Narrative  . Not on file    FAMILY HISTORY:  Family History  Problem Relation Age of Onset  . Diabetes Father   . Hypertension Unknown     CURRENT MEDICATIONS:  Outpatient Encounter Medications as of 01/15/2019  Medication Sig  . aspirin 81 MG tablet Take 81 mg by mouth daily.    . carvedilol (COREG) 12.5 MG tablet TAKE 1 TABLET TWICE A DAY  WITH MEALS  . furosemide (LASIX) 40 MG tablet Take 40 mg ( 1 Tablet)  in the AM and Take 20 mg(1/2  Tablet)  in the PM  . gabapentin (NEURONTIN) 300 MG capsule Take 3 capsules (900 mg total) by mouth daily.  . metFORMIN (GLUCOPHAGE-XR) 500 MG 24 hr tablet   . NON FORMULARY legatrin pm Daily    . olmesartan (BENICAR) 20 MG tablet Take 20 mg by mouth daily.  [name] PROAIR HFA 108 (90 BASE) MCG/ACT inhaler Inhale 2 puffs into the lungs 4 (four) times daily.   . simvastatin (ZOCOR) 40 MG tablet   . warfarin (JANTOVEN) 5 MG tablet TAKE 1 TABLET DAILY EXCEPT 2 TABLETS ON MONDAYS AND   FRIDAYS   No facility-administered encounter medications on file as of 01/15/2019.     ALLERGIES:  No Known Allergies   PHYSICAL EXAM:  ECOG Performance status: 1  Vitals:   01/15/19 1227  BP: (!) 94/58  Pulse: (!) 58  Resp: 18  Temp: 98.7 F (37.1 C)  SpO2: 95%   Filed Weights   01/15/19 1227  Weight: 188 lb (85.3 kg)    Physical Exam Constitutional:      Appearance: Normal appearance. He is normal weight.  Cardiovascular:     Rate and Rhythm: Normal rate and regular rhythm.     Heart sounds: Normal heart sounds.  Pulmonary:     Effort: Pulmonary effort is normal.     Breath sounds: Normal breath sounds.  Abdominal:     General: Bowel sounds are normal.     Palpations: Abdomen is soft.  Musculoskeletal: Normal range of motion.  Skin:    General: Skin is warm and dry.  Neurological:     Mental Status: He is alert and oriented to person, place, and time. Mental status is at baseline.  Psychiatric:        Mood and Affect: Mood normal.        Behavior: Behavior normal.        Thought Content: Thought content normal.        Judgment: Judgment normal.      LABORATORY DATA:  I have reviewed the labs as listed.  CBC    Component Value Date/Time   WBC 6.8 01/15/2019 1203   RBC 4.32 01/15/2019 1203   HGB 12.1 (L) 01/15/2019 1203   HCT 39.1 01/15/2019 1203   PLT 197 01/15/2019 1203   MCV 90.5 01/15/2019 1203   MCH 28.0 01/15/2019 1203   MCHC 30.9 01/15/2019 1203   RDW 13.2  01/15/2019 1203   LYMPHSABS 1.5 01/15/2019 1203   MONOABS 0.6 01/15/2019 1203   EOSABS 0.3 01/15/2019 1203   BASOSABS 0.0  01/15/2019 1203   CMP Latest Ref Rng & Units 01/15/2019 01/14/2018 09/23/2016  Glucose 70 - 99 mg/dL 110(H) 109(H) 124(H)  BUN 8 - 23 mg/dL 33(H) 30(H) 18  Creatinine 0.61 - 1.24 mg/dL 1.58(H) 1.70(H) 1.22  Sodium 135 - 145 mmol/L 140 140 137  Potassium 3.5 - 5.1 mmol/L 4.5 5.0 4.6  Chloride 98 - 111 mmol/L 106 105 105  CO2 22 - 32 mmol/L 24 27 26   Calcium 8.9 - 10.3 mg/dL 9.2 9.3 8.9  Total Protein 6.5 - 8.1 g/dL 7.3 7.7 -  Total Bilirubin 0.3 - 1.2 mg/dL 0.5 0.7 -  Alkaline Phos 38 - 126 U/L 45 51 -  AST 15 - 41 U/L 21 19 -  ALT 0 - 44 U/L 17 16(L) -   I personally performed a face-to-face visit.  All questions were answered to patient's stated satisfaction. Encouraged patient to call with any new concerns or questions before his next visit to the cancer center and we can certain see him sooner, if needed.     ASSESSMENT & PLAN:   Superficial phlebitis 1.  Superficial phlebitis of the right lower extremity: -She had 3-4 episodes of superficial phlebitis without extension to the deep veins starting 2009. - He was started on Coumadin for his CVA.  He has not had any episodes of phlebitis since starting on Coumadin. -He also had a right leg stasis ulcer which healed up nicely.  He has been wearing his compression stockings. -Labs on 01/15/2019 showed his potassium 4.5, creatinine 1.58, LFTs WNL, WBC 6.8, hemoglobin 12.1, platelets 197. -Upon physical assessment he had no venous ulcers or redness or swelling on his legs. - He will follow-up in 1 year with repeat labs.  2.  Chronic kidney disease: -His creatinine is stable around 1.7. -Labs on 01/15/2019 showed his creatinine 1.58. -He is on Lasix and ACE inhibitor which can increase her creatinine. -His primary care doctor will continue to monitor. - He does report that he is more fatigued than normal his  hemoglobin was 12.1 which is trending down.  We will check an iron panel on him and call him with his results on Tuesday.      Orders placed this encounter:  Orders Placed This Encounter  Procedures  . Iron and TIBC  . Ferritin  . Vitamin B12      Natale Lay, FNP-C [place] [phone]

## 2019-01-27 DIAGNOSIS — E782 Mixed hyperlipidemia: Secondary | ICD-10-CM | POA: Diagnosis not present

## 2019-01-27 DIAGNOSIS — E1122 Type 2 diabetes mellitus with diabetic chronic kidney disease: Secondary | ICD-10-CM | POA: Diagnosis not present

## 2019-01-27 DIAGNOSIS — N183 Chronic kidney disease, stage 3 (moderate): Secondary | ICD-10-CM | POA: Diagnosis not present

## 2019-01-27 DIAGNOSIS — I5032 Chronic diastolic (congestive) heart failure: Secondary | ICD-10-CM | POA: Diagnosis not present

## 2019-01-27 DIAGNOSIS — I10 Essential (primary) hypertension: Secondary | ICD-10-CM | POA: Diagnosis not present

## 2019-02-11 ENCOUNTER — Ambulatory Visit (INDEPENDENT_AMBULATORY_CARE_PROVIDER_SITE_OTHER): Payer: Medicare Other | Admitting: *Deleted

## 2019-02-11 DIAGNOSIS — I6349 Cerebral infarction due to embolism of other cerebral artery: Secondary | ICD-10-CM

## 2019-02-11 DIAGNOSIS — Z5181 Encounter for therapeutic drug level monitoring: Secondary | ICD-10-CM | POA: Diagnosis not present

## 2019-02-11 DIAGNOSIS — I6350 Cerebral infarction due to unspecified occlusion or stenosis of unspecified cerebral artery: Secondary | ICD-10-CM | POA: Diagnosis not present

## 2019-02-11 DIAGNOSIS — I639 Cerebral infarction, unspecified: Secondary | ICD-10-CM | POA: Diagnosis not present

## 2019-02-11 DIAGNOSIS — Z86718 Personal history of other venous thrombosis and embolism: Secondary | ICD-10-CM | POA: Diagnosis not present

## 2019-02-11 DIAGNOSIS — Z7901 Long term (current) use of anticoagulants: Secondary | ICD-10-CM

## 2019-02-11 LAB — POCT INR: INR: 2.7 (ref 2.0–3.0)

## 2019-02-11 NOTE — Patient Instructions (Signed)
Continue coumadin 1 tablet daily except 2 tablets on Mondays and Fridays Recheck in 6 weeks

## 2019-02-26 DIAGNOSIS — E1142 Type 2 diabetes mellitus with diabetic polyneuropathy: Secondary | ICD-10-CM | POA: Diagnosis not present

## 2019-02-26 DIAGNOSIS — B351 Tinea unguium: Secondary | ICD-10-CM | POA: Diagnosis not present

## 2019-03-16 DIAGNOSIS — E1122 Type 2 diabetes mellitus with diabetic chronic kidney disease: Secondary | ICD-10-CM | POA: Diagnosis not present

## 2019-03-16 DIAGNOSIS — I10 Essential (primary) hypertension: Secondary | ICD-10-CM | POA: Diagnosis not present

## 2019-03-16 DIAGNOSIS — N183 Chronic kidney disease, stage 3 (moderate): Secondary | ICD-10-CM | POA: Diagnosis not present

## 2019-03-16 DIAGNOSIS — E782 Mixed hyperlipidemia: Secondary | ICD-10-CM | POA: Diagnosis not present

## 2019-03-16 DIAGNOSIS — I5032 Chronic diastolic (congestive) heart failure: Secondary | ICD-10-CM | POA: Diagnosis not present

## 2019-03-24 ENCOUNTER — Telehealth: Payer: Self-pay | Admitting: *Deleted

## 2019-03-24 NOTE — Telephone Encounter (Signed)

## 2019-03-25 ENCOUNTER — Ambulatory Visit (INDEPENDENT_AMBULATORY_CARE_PROVIDER_SITE_OTHER): Payer: Medicare Other | Admitting: *Deleted

## 2019-03-25 ENCOUNTER — Other Ambulatory Visit: Payer: Self-pay

## 2019-03-25 DIAGNOSIS — I6349 Cerebral infarction due to embolism of other cerebral artery: Secondary | ICD-10-CM | POA: Diagnosis not present

## 2019-03-25 DIAGNOSIS — Z7901 Long term (current) use of anticoagulants: Secondary | ICD-10-CM | POA: Diagnosis not present

## 2019-03-25 DIAGNOSIS — Z86718 Personal history of other venous thrombosis and embolism: Secondary | ICD-10-CM | POA: Diagnosis not present

## 2019-03-25 DIAGNOSIS — I6350 Cerebral infarction due to unspecified occlusion or stenosis of unspecified cerebral artery: Secondary | ICD-10-CM | POA: Diagnosis not present

## 2019-03-25 DIAGNOSIS — I639 Cerebral infarction, unspecified: Secondary | ICD-10-CM

## 2019-03-25 DIAGNOSIS — Z5181 Encounter for therapeutic drug level monitoring: Secondary | ICD-10-CM

## 2019-03-25 LAB — POCT INR: INR: 2.8 (ref 2.0–3.0)

## 2019-03-25 NOTE — Patient Instructions (Signed)
Description   Continue Coumadin 1 tablet daily except 2 tablets on Mondays and Fridays.  Recheck in 6 weeks.

## 2019-04-11 ENCOUNTER — Other Ambulatory Visit: Payer: Self-pay | Admitting: Cardiovascular Disease

## 2019-04-22 ENCOUNTER — Encounter: Payer: Self-pay | Admitting: Cardiology

## 2019-04-22 ENCOUNTER — Other Ambulatory Visit: Payer: Self-pay

## 2019-04-22 ENCOUNTER — Ambulatory Visit (INDEPENDENT_AMBULATORY_CARE_PROVIDER_SITE_OTHER): Payer: Medicare Other | Admitting: Cardiology

## 2019-04-22 ENCOUNTER — Other Ambulatory Visit (HOSPITAL_COMMUNITY): Payer: Self-pay

## 2019-04-22 VITALS — BP 124/75 | HR 66 | Temp 96.9°F | Ht 72.0 in | Wt 198.0 lb

## 2019-04-22 DIAGNOSIS — I10 Essential (primary) hypertension: Secondary | ICD-10-CM | POA: Diagnosis not present

## 2019-04-22 DIAGNOSIS — E782 Mixed hyperlipidemia: Secondary | ICD-10-CM | POA: Diagnosis not present

## 2019-04-22 DIAGNOSIS — D509 Iron deficiency anemia, unspecified: Secondary | ICD-10-CM

## 2019-04-22 DIAGNOSIS — I5032 Chronic diastolic (congestive) heart failure: Secondary | ICD-10-CM | POA: Diagnosis not present

## 2019-04-22 DIAGNOSIS — I5042 Chronic combined systolic (congestive) and diastolic (congestive) heart failure: Secondary | ICD-10-CM | POA: Diagnosis not present

## 2019-04-22 DIAGNOSIS — I6350 Cerebral infarction due to unspecified occlusion or stenosis of unspecified cerebral artery: Secondary | ICD-10-CM | POA: Diagnosis not present

## 2019-04-22 DIAGNOSIS — E1122 Type 2 diabetes mellitus with diabetic chronic kidney disease: Secondary | ICD-10-CM | POA: Diagnosis not present

## 2019-04-22 DIAGNOSIS — N183 Chronic kidney disease, stage 3 (moderate): Secondary | ICD-10-CM | POA: Diagnosis not present

## 2019-04-22 DIAGNOSIS — Z8673 Personal history of transient ischemic attack (TIA), and cerebral infarction without residual deficits: Secondary | ICD-10-CM

## 2019-04-22 DIAGNOSIS — I255 Ischemic cardiomyopathy: Secondary | ICD-10-CM

## 2019-04-22 NOTE — Patient Instructions (Signed)
Medication Instructions: Your physician recommends that you continue on your current medications as directed. Please refer to the Current Medication list given to you today.   Labwork: None  Procedures/Testing: Your physician has requested that you have an echocardiogram. Echocardiography is a painless test that uses sound waves to create images of your heart. It provides your doctor with information about the size and shape of your heart and how well your heart's chambers and valves are working. This procedure takes approximately one hour. There are no restrictions for this procedure.    Follow-Up: To be determined after echo  Any Additional Special Instructions Will Be Listed Below (If Applicable).     If you need a refill on your cardiac medications before your next appointment, please call your pharmacy.       Thank you for choosing [facility] !

## 2019-04-22 NOTE — Progress Notes (Signed)
Cardiology Office Note  Date: 04/22/2019   ID: EXEQUIEL Yu, DOB [birth year], MRN [id number]  PCP:  [name], MD  Cardiologist:  Rozann Lesches, MD Electrophysiologist:  None   Chief Complaint  Patient presents with  . Cardiac follow-up    History of Present Illness: Dennis Yu is a 73 y.o. male last seen in September 2019.  He presents for a routine visit.  From a cardiac perspective he does not describe any progressive shortness of breath, currently NYHA class II with most activities.  He is relatively sedentary however.  Movement is affected by residua from previous stroke and also arthritic pain.  He is using a cane and denies any falls.  He does not report any chest pain at this time, no palpitations or syncope.  I reviewed his medications which are outlined below.  I did talk with him about arranging a follow-up echocardiogram for reassessment of LVEF, we will also get his interval lab work from Dr. Nevada Yu.  We might consider switching from Benicar to [place].  He remains on Coumadin with follow-up in the anticoagulation clinic.  Recent INR was 2.8.  He does not report any spontaneous bleeding problems.  Past Medical History:  Diagnosis Date  . Bronchitis 11/2012  . Cardiomyopathy    LVEF 25%  . Diabetes mellitus   . Erectile dysfunction   . Essential hypertension   . GERD (gastroesophageal reflux disease)   . History of colonic polyps   . Mixed hyperlipidemia   . 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 Obstructive sleep apnea   . Patent foramen ovale   . Phlebitis   . 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 Superficial phlebitis 12/10/2012  . Type 2 diabetes mellitus ([place])   . Vitamin D deficiency     History reviewed. No pertinent surgical history.  Current Outpatient Medications  Medication Sig Dispense Refill  . aspirin 81 MG tablet Take 81 mg by mouth daily.      . carvedilol (COREG) 12.5 MG tablet TAKE 1 TABLET TWICE A DAY  WITH  MEALS 180 tablet 3  . furosemide (LASIX) 40 MG tablet Take 40 mg by mouth daily.    [name] gabapentin (NEURONTIN) 300 MG capsule Take 3 capsules (900 mg total) by mouth daily. 90 capsule 11  . metFORMIN (GLUCOPHAGE-XR) 500 MG 24 hr tablet     . NON FORMULARY legatrin pm Daily      . olmesartan (BENICAR) 20 MG tablet Take 20 mg by mouth daily.    [name] PROAIR HFA 108 (90 BASE) MCG/ACT inhaler Inhale 2 puffs into the lungs 4 (four) times daily.     . simvastatin (ZOCOR) 40 MG tablet     . warfarin (JANTOVEN) 5 MG tablet TAKE 1 TABLET DAILY EXCEPT 2 TABLETS ON MONDAYS AND   FRIDAYS 120 tablet 1   No current facility-administered medications for this visit.    Allergies:  Patient has no known allergies.   Social History: The patient  reports that he quit smoking about 15 years ago. His smoking use included cigarettes. He started smoking about 51 years ago. He has never used smokeless tobacco. He reports that he does not drink alcohol or use drugs.   ROS:  Please see the history of present illness. Otherwise, complete review of systems is positive for none.  All other systems are reviewed and negative.   Physical Exam: VS:  BP 124/75   Pulse  66   Temp (!) 96.9 F (36.1 C)   Ht 6' (1.829 m)   Wt 198 lb (89.8 kg)   BMI 26.85 kg/m , BMI Body mass index is 26.85 kg/m.  Wt Readings from Last 3 Encounters:  04/22/19 198 lb (89.8 kg)  01/15/19 188 lb (85.3 kg)  04/29/18 196 lb (88.9 kg)    General: Patient appears comfortable at rest.  Uses a cane. HEENT: Conjunctiva and lids normal, wearing a mask. Neck: Supple, no elevated JVP or carotid bruits, no thyromegaly. Lungs: Clear to auscultation, nonlabored breathing at rest. Cardiac: Regular rate and rhythm, no S3, 2/6 systolic murmur. Abdomen: Soft, nontender, bowel sounds present. Extremities: No pitting edema, wearing compression stockings, distal pulses 2+. Skin: Warm and dry. Musculoskeletal: No kyphosis. Neuropsychiatric: Alert and  oriented x3, affect grossly appropriate.  Residual left-sided weakness as before.  ECG:  An ECG dated 04/29/2018 was personally reviewed today and demonstrated:  Sinus rhythm with old anterolateral/inferior infarct pattern and repolarization abnormalities.  Recent Labwork: 01/15/2019: ALT 17; AST 21; BUN 33; Creatinine, Ser 1.58; Hemoglobin 12.1; Platelets 197; Potassium 4.5; Sodium 140   Other Studies Reviewed Today:  Echocardiogram 04/10/2016: Study Conclusions  - Left ventricle: The cavity size was mildly dilated. Wall   thickness was increased in a pattern of moderate LVH. Systolic   function was severely reduced. The estimated ejection fraction   was in the range of 25% to 30%. Diffuse hypokinesis. Noncompacted   appearance of the apical and distal anterolateral myocardium.   Doppler parameters are consistent with abnormal left ventricular   relaxation (grade 1 diastolic dysfunction). - Regional wall motion abnormality: Akinesis of the apical   anterior, apical lateral, and apical myocardium; severe   hypokinesis of the mid anterolateral myocardium. - Aortic valve: Trileaflet; mildly thickened leaflets. - Aorta: Mild aortic root dilatation. Aortic root dimension: 42 mm   (ED). - Mitral valve: Mildly thickened leaflets . There was mild   regurgitation. - Left atrium: The atrium was mildly dilated. - Right ventricle: Systolic function was mildly reduced. - Atrial septum: No defect or patent foramen ovale was identified. - Tricuspid valve: Mildly thickened leaflets.  Assessment and Plan:  1.  Chronic systolic heart failure.  Weight has been stable and he denies any significant orthopnea or progressive leg swelling on current dose of Lasix.  2.  Ischemic cardiomyopathy, LVEF 25 to 30% by last assessment.  Plan is to obtain a follow-up echocardiogram for reevaluation.  He has continued on Coreg, Benicar, and Lasix.  We will likely consider switching to [facility].  3.  CKD stage III,  creatinine 1.58 in June.  This does not preclude making medication adjustments as above, but will likely defer addition of Aldactone in stepwise fashion depending on follow-up lab work.  4.  Mixed hyperlipidemia, he continues on Zocor.  Requesting interval lab work from Dr. Nevada Yu.  5.  History of stroke and inconsistent documentation of PFO.  He continues on Coumadin with follow-up in anticoagulation clinic.  Medication Adjustments/Labs and Tests Ordered: Current medicines are reviewed at length with the patient today.  Concerns regarding medicines are outlined above.   Tests Ordered: Orders Placed This Encounter  Procedures  . ECHOCARDIOGRAM COMPLETE    Medication Changes: No orders of the defined types were placed in this encounter.   Disposition:  Follow up test results and determine disposition.  Signed, Satira Sark, MD, [facility] 04/22/2019 9:03 AM    [facility].  [address] [place], [place] [address] Phone: [phone]; Fax: [phone]

## 2019-04-23 ENCOUNTER — Inpatient Hospital Stay (HOSPITAL_COMMUNITY): Payer: Medicare Other | Attending: Hematology

## 2019-04-23 ENCOUNTER — Inpatient Hospital Stay (HOSPITAL_BASED_OUTPATIENT_CLINIC_OR_DEPARTMENT_OTHER): Payer: Medicare Other | Admitting: Nurse Practitioner

## 2019-04-23 VITALS — BP 106/71 | HR 57 | Temp 97.3°F | Resp 16 | Wt 199.0 lb

## 2019-04-23 DIAGNOSIS — E782 Mixed hyperlipidemia: Secondary | ICD-10-CM | POA: Insufficient documentation

## 2019-04-23 DIAGNOSIS — G4733 Obstructive sleep apnea (adult) (pediatric): Secondary | ICD-10-CM | POA: Insufficient documentation

## 2019-04-23 DIAGNOSIS — Z7984 Long term (current) use of oral hypoglycemic drugs: Secondary | ICD-10-CM | POA: Insufficient documentation

## 2019-04-23 DIAGNOSIS — Z87891 Personal history of nicotine dependence: Secondary | ICD-10-CM | POA: Diagnosis not present

## 2019-04-23 DIAGNOSIS — I10 Essential (primary) hypertension: Secondary | ICD-10-CM | POA: Insufficient documentation

## 2019-04-23 DIAGNOSIS — Z7901 Long term (current) use of anticoagulants: Secondary | ICD-10-CM | POA: Insufficient documentation

## 2019-04-23 DIAGNOSIS — D509 Iron deficiency anemia, unspecified: Secondary | ICD-10-CM | POA: Insufficient documentation

## 2019-04-23 DIAGNOSIS — Z8673 Personal history of transient ischemic attack (TIA), and cerebral infarction without residual deficits: Secondary | ICD-10-CM | POA: Diagnosis not present

## 2019-04-23 DIAGNOSIS — Z23 Encounter for immunization: Secondary | ICD-10-CM | POA: Diagnosis not present

## 2019-04-23 DIAGNOSIS — Z7982 Long term (current) use of aspirin: Secondary | ICD-10-CM | POA: Diagnosis not present

## 2019-04-23 DIAGNOSIS — E119 Type 2 diabetes mellitus without complications: Secondary | ICD-10-CM | POA: Diagnosis not present

## 2019-04-23 DIAGNOSIS — Z79899 Other long term (current) drug therapy: Secondary | ICD-10-CM | POA: Diagnosis not present

## 2019-04-23 DIAGNOSIS — Z0000 Encounter for general adult medical examination without abnormal findings: Secondary | ICD-10-CM | POA: Diagnosis not present

## 2019-04-23 LAB — COMPREHENSIVE METABOLIC PANEL
ALT: 22 U/L (ref 0–44)
AST: 27 U/L (ref 15–41)
Albumin: 4.1 g/dL (ref 3.5–5.0)
Alkaline Phosphatase: 48 U/L (ref 38–126)
Anion gap: 7 (ref 5–15)
BUN: 25 mg/dL — ABNORMAL HIGH (ref 8–23)
CO2: 25 mmol/L (ref 22–32)
Calcium: 9 mg/dL (ref 8.9–10.3)
Chloride: 106 mmol/L (ref 98–111)
Creatinine, Ser: 1.38 mg/dL — ABNORMAL HIGH (ref 0.61–1.24)
GFR calc Af Amer: 59 mL/min — ABNORMAL LOW (ref >60)
GFR calc non Af Amer: 51 mL/min — ABNORMAL LOW (ref >60)
Glucose, Bld: 105 mg/dL — ABNORMAL HIGH (ref 70–99)
Potassium: 4.2 mmol/L (ref 3.5–5.1)
Sodium: 138 mmol/L (ref 135–145)
Total Bilirubin: 0.4 mg/dL (ref 0.3–1.2)
Total Protein: 7.4 g/dL (ref 6.5–8.1)

## 2019-04-23 LAB — FERRITIN: Ferritin: 61 ng/mL (ref 24–336)

## 2019-04-23 LAB — CBC WITH DIFFERENTIAL/PLATELET
Abs Immature Granulocytes: 0.01 10*3/uL (ref 0.00–0.07)
Basophils Absolute: 0 10*3/uL (ref 0.0–0.1)
Basophils Relative: 0 %
Eosinophils Absolute: 0.3 10*3/uL (ref 0.0–0.5)
Eosinophils Relative: 4 %
HCT: 40.3 % (ref 39.0–52.0)
Hemoglobin: 12.2 g/dL — ABNORMAL LOW (ref 13.0–17.0)
Immature Granulocytes: 0 %
Lymphocytes Relative: 21 %
Lymphs Abs: 1.7 10*3/uL (ref 0.7–4.0)
MCH: 27.9 pg (ref 26.0–34.0)
MCHC: 30.3 g/dL (ref 30.0–36.0)
MCV: 92.2 fL (ref 80.0–100.0)
Monocytes Absolute: 0.6 10*3/uL (ref 0.1–1.0)
Monocytes Relative: 8 %
Neutro Abs: 5.6 10*3/uL (ref 1.7–7.7)
Neutrophils Relative %: 67 %
Platelets: 196 10*3/uL (ref 150–400)
RBC: 4.37 MIL/uL (ref 4.22–5.81)
RDW: 13 % (ref 11.5–15.5)
WBC: 8.3 10*3/uL (ref 4.0–10.5)
nRBC: 0 % (ref 0.0–0.2)

## 2019-04-23 LAB — IRON AND TIBC
Iron: 49 ug/dL (ref 45–182)
Saturation Ratios: 13 % — ABNORMAL LOW (ref 17.9–39.5)
TIBC: 378 ug/dL (ref 250–450)
UIBC: 329 ug/dL

## 2019-04-23 LAB — VITAMIN B12: Vitamin B-12: 652 pg/mL (ref 180–914)

## 2019-04-23 MED ORDER — INFLUENZA VAC A&B SA ADJ QUAD 0.5 ML IM PRSY
PREFILLED_SYRINGE | INTRAMUSCULAR | Status: AC
  Filled 2019-04-23: qty 0.5

## 2019-04-23 MED ORDER — INFLUENZA VAC A&B SA ADJ QUAD 0.5 ML IM PRSY
0.5000 mL | PREFILLED_SYRINGE | Freq: Once | INTRAMUSCULAR | Status: AC
  Administered 2019-04-23: 0.5 mL via INTRAMUSCULAR

## 2019-04-23 NOTE — Progress Notes (Signed)
[place], [place] [address]   CLINIC:  Medical Oncology/Hematology  PCP:  [name], MD [address] [place] [address] [phone]   REASON FOR VISIT: Follow-up for iron deficiency anemia  CURRENT THERAPY: Observation   INTERVAL HISTORY:  Dennis Yu 73 y.o. male returns for routine follow-up for iron deficiency anemia.  He reports he is doing better than his last visit.  He reports his energy level is improved.  He is able to complete all of his daily activities.  He denies any bright red bleeding per rectum or melena.  He reports the oral iron constipating him and hurt his stomach. Denies any nausea, vomiting, or diarrhea. Denies any new pains. Had not noticed any recent bleeding such as epistaxis, hematuria or hematochezia. Denies recent chest pain on exertion, shortness of breath on minimal exertion, pre-syncopal episodes, or palpitations. Denies any numbness or tingling in hands or feet. Denies any recent fevers, infections, or recent hospitalizations. Patient reports appetite at 75% and energy level at 75%.  He is eating well maintain his weight at this time.    REVIEW OF SYSTEMS:  Review of Systems  All other systems reviewed and are negative.    PAST MEDICAL/SURGICAL HISTORY:  Past Medical History:  Diagnosis Date  . Bronchitis 11/2012  . Cardiomyopathy    LVEF 25%  . Diabetes mellitus   . Erectile dysfunction   . Essential hypertension   . GERD (gastroesophageal reflux disease)   . History of colonic polyps   . Mixed hyperlipidemia   . 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 Obstructive sleep apnea   . Patent foramen ovale   . Phlebitis   . 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 Superficial phlebitis 12/10/2012  . Type 2 diabetes mellitus ([place])   . Vitamin D deficiency    No past surgical history on file.   SOCIAL HISTORY:  Social History   Socioeconomic History  . Marital status: Married     Spouse name: Not on file  . Number of children: Not on file  . Years of education: Not on file  . Highest education level: Not on file  Occupational History  . Occupation: Truck Education administrator: RETIRED  Social Needs  . Financial resource strain: Not on file  . Food insecurity    Worry: Not on file    Inability: Not on file  . Transportation needs    Medical: Not on file    Non-medical: Not on file  Tobacco Use  . Smoking status: Former Smoker    Types: Cigarettes    Start date: 08/06/1967    Quit date: 08/06/2003    Years since quitting: 15.7  . Smokeless tobacco: Never Used  Substance and Sexual Activity  . Alcohol use: No    Alcohol/week: 0.0 standard drinks  . Drug use: No  . Sexual activity: Not Currently  Lifestyle  . Physical activity    Days per week: Not on file    Minutes per session: Not on file  . Stress: Not on file  Relationships  . Social Herbalist on phone: Not on file    Gets together: Not on file    Attends religious service: Not on file    Active member of club or organization: Not on file    Attends meetings of clubs or organizations: Not on file  Relationship status: Not on file  . Intimate partner violence    Fear of current or ex partner: Not on file    Emotionally abused: Not on file    Physically abused: Not on file    Forced sexual activity: Not on file  Other Topics Concern  . Not on file  Social History Narrative  . Not on file    FAMILY HISTORY:  Family History  Problem Relation Age of Onset  . Diabetes Father   . Hypertension Other     CURRENT MEDICATIONS:  Outpatient Encounter Medications as of 04/23/2019  Medication Sig  . aspirin 81 MG tablet Take 81 mg by mouth daily.    . carvedilol (COREG) 12.5 MG tablet TAKE 1 TABLET TWICE A DAY  WITH MEALS  . furosemide (LASIX) 40 MG tablet Take 40 mg by mouth daily.  [name] gabapentin (NEURONTIN) 300 MG capsule Take 3 capsules (900 mg total) by mouth daily.  [name] glipiZIDE  (GLUCOTROL) 5 MG tablet Take 5 mg by mouth daily.   . metFORMIN (GLUCOPHAGE-XR) 500 MG 24 hr tablet Take 500 mg by mouth daily.   . NON FORMULARY legatrin pm Daily    . olmesartan (BENICAR) 20 MG tablet Take 20 mg by mouth daily.  [name] PROAIR HFA 108 (90 BASE) MCG/ACT inhaler Inhale 2 puffs into the lungs 4 (four) times daily.   . simvastatin (ZOCOR) 40 MG tablet Take 40 mg by mouth daily at 6 PM.   . warfarin (JANTOVEN) 5 MG tablet TAKE 1 TABLET DAILY EXCEPT 2 TABLETS ON MONDAYS AND   FRIDAYS  . [EXPIRED] influenza vaccine adjuvanted (FLUAD) injection 0.5 mL    No facility-administered encounter medications on file as of 04/23/2019.     ALLERGIES:  No Known Allergies   PHYSICAL EXAM:  ECOG Performance status: 1  Vitals:   04/23/19 1227  BP: 106/71  Pulse: (!) 57  Resp: 16  Temp: (!) 97.3 F (36.3 C)  SpO2: 99%   Filed Weights   04/23/19 1227  Weight: 199 lb (90.3 kg)    Physical Exam Constitutional:      Appearance: Normal appearance. He is normal weight.  Cardiovascular:     Rate and Rhythm: Normal rate and regular rhythm.     Heart sounds: Normal heart sounds.  Pulmonary:     Effort: Pulmonary effort is normal.     Breath sounds: Normal breath sounds.  Abdominal:     General: Bowel sounds are normal.     Palpations: Abdomen is soft.  Musculoskeletal: Normal range of motion.  Skin:    General: Skin is warm and dry.  Neurological:     Mental Status: He is alert and oriented to person, place, and time. Mental status is at baseline.  Psychiatric:        Mood and Affect: Mood normal.        Behavior: Behavior normal.        Thought Content: Thought content normal.        Judgment: Judgment normal.      LABORATORY DATA:  I have reviewed the labs as listed.  CBC    Component Value Date/Time   WBC 8.3 04/23/2019 1155   RBC 4.37 04/23/2019 1155   HGB 12.2 (L) 04/23/2019 1155   HCT 40.3 04/23/2019 1155   PLT 196 04/23/2019 1155   MCV 92.2 04/23/2019 1155    MCH 27.9 04/23/2019 1155   MCHC 30.3 04/23/2019 1155   RDW 13.0 04/23/2019 1155  LYMPHSABS 1.7 04/23/2019 1155   MONOABS 0.6 04/23/2019 1155   EOSABS 0.3 04/23/2019 1155   BASOSABS 0.0 04/23/2019 1155   CMP Latest Ref Rng & Units 04/23/2019 01/15/2019 01/14/2018  Glucose 70 - 99 mg/dL 105(H) 110(H) 109(H)  BUN 8 - 23 mg/dL 25(H) 33(H) 30(H)  Creatinine 0.61 - 1.24 mg/dL 1.38(H) 1.58(H) 1.70(H)  Sodium 135 - 145 mmol/L 138 140 140  Potassium 3.5 - 5.1 mmol/L 4.2 4.5 5.0  Chloride 98 - 111 mmol/L 106 106 105  CO2 22 - 32 mmol/L 25 24 27   Calcium 8.9 - 10.3 mg/dL 9.0 9.2 9.3  Total Protein 6.5 - 8.1 g/dL 7.4 7.3 7.7  Total Bilirubin 0.3 - 1.2 mg/dL 0.4 0.5 0.7  Alkaline Phos 38 - 126 U/L 48 45 51  AST 15 - 41 U/L 27 21 19   ALT 0 - 44 U/L 22 17 16(L)     I personally performed a face-to-face visit.  All questions were answered to patient's stated satisfaction. Encouraged patient to call with any new concerns or questions before his next visit to the cancer center and we can certain see him sooner, if needed.     ASSESSMENT & PLAN:   Iron deficiency anemia 1.  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 -He reports increased fatigue over the past few months. - He has never tried oral iron in the past. - We have placed him on 1 tab daily. - Labs on 01/15/2019 showed his hemoglobin 12.1, ferritin 48, percent saturation 19 -He reports he only took the iron tablets for a few weeks due to constipation and abdominal pain. -He reports his energy levels have returned to normal and he feels fine. -Labs on 04/23/2019 showed his hemoglobin 12.2, ferritin 61, percent saturation 13. -I do not recommend any IV iron at this time. - We will follow-up in 6 months with repeat labs.      Orders placed this encounter:  Orders Placed This Encounter  Procedures  . Lactate dehydrogenase  . CBC with Differential/Platelet  . Comprehensive metabolic panel  . Ferritin   . Iron and TIBC  . Vitamin B12  . VITAMIN D 25 Hydroxy (Vit-D Deficiency, Fractures)      Francene Finders, FNP-C [place] [phone]

## 2019-04-23 NOTE — Assessment & Plan Note (Addendum)
1.  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 -He reports increased fatigue over the past few months. - He has never tried oral iron in the past. - We have placed him on 1 tab daily. - Labs on 01/15/2019 showed his hemoglobin 12.1, ferritin 48, percent saturation 19 -He reports he only took the iron tablets for a few weeks due to constipation and abdominal pain. -He reports his energy levels have returned to normal and he feels fine. -Labs on 04/23/2019 showed his hemoglobin 12.2, ferritin 61, percent saturation 13. -I do not recommend any IV iron at this time. - We will follow-up in 6 months with repeat labs.

## 2019-04-23 NOTE — Patient Instructions (Signed)
[facility] at [facility] Discharge Instructions  Follow up in 6 months with labs    Thank you for choosing [facility] at [facility] to provide your oncology and hematology care.  To afford each patient quality time with our provider, please arrive at least 15 minutes before your scheduled appointment time.   If you have a lab appointment with the Cancer Center please come in thru the Main Entrance and check in at the main information desk.  You need to re-schedule your appointment should you arrive 10 or more minutes late.  We strive to give you quality time with our providers, and arriving late affects you and other patients whose appointments are after yours.  Also, if you no show three or more times for appointments you may be dismissed from the clinic at the providers discretion.     Again, thank you for choosing [facility].  Our hope is that these requests will decrease the amount of time that you wait before being seen by our physicians.       _____________________________________________________________  Should you have questions after your visit to [facility], please contact our office at [phone] between the hours of 8:00 a.m. and 4:30 p.m.  Voicemails left after 4:00 p.m. will not be returned until the following business day.  For prescription refill requests, have your pharmacy contact our office and allow 72 hours.    Due to Covid, you will need to wear a mask upon entering the hospital. If you do not have a mask, a mask will be given to you at the Main Entrance upon arrival. For doctor visits, patients may have 1 support person with them. For treatment visits, patients can not have anyone with them due to social distancing guidelines and our immunocompromised population.      

## 2019-04-30 ENCOUNTER — Ambulatory Visit (HOSPITAL_COMMUNITY)
Admission: RE | Admit: 2019-04-30 | Discharge: 2019-04-30 | Disposition: A | Discharge status: Home / Self Care | Payer: Medicare Other | Source: Ambulatory Visit | Attending: Cardiology | Admitting: Cardiology

## 2019-04-30 ENCOUNTER — Other Ambulatory Visit: Payer: Self-pay

## 2019-04-30 DIAGNOSIS — I255 Ischemic cardiomyopathy: Secondary | ICD-10-CM | POA: Diagnosis present

## 2019-04-30 MED ORDER — PERFLUTREN LIPID MICROSPHERE
1.0000 mL | INTRAVENOUS | Status: AC | PRN
  Administered 2019-04-30: 1 mL via INTRAVENOUS
  Filled 2019-04-30: qty 10

## 2019-04-30 NOTE — Progress Notes (Signed)
*  PRELIMINARY RESULTS* Echocardiogram 2D Echocardiogram WITH DEFINITY has been performed. Patient reports feeling fine.   Dennis Yu 04/30/2019, 9:59 AM

## 2019-05-03 ENCOUNTER — Telehealth: Payer: Self-pay

## 2019-05-03 NOTE — Telephone Encounter (Signed)
Called pt. No answer. Left message for pt to return call.  

## 2019-05-03 NOTE — Telephone Encounter (Signed)
-----   Message from Satira Sark, MD sent at 04/30/2019 12:12 PM EDT ----- Results reviewed.  LVEF approximately 25% with other stable findings including evidence of old LV mural thrombus.  As we discussed at the recent office visit, anticipate medication adjustments.  Would have him stop Benicar and start Entresto 24/26 mg twice daily.  He will need a follow-up BMET in 7 to 10 days and then office visit in the next 6 weeks.

## 2019-05-04 ENCOUNTER — Telehealth: Payer: Self-pay

## 2019-05-04 DIAGNOSIS — I5042 Chronic combined systolic (congestive) and diastolic (congestive) heart failure: Secondary | ICD-10-CM

## 2019-05-04 MED ORDER — ENTRESTO 24-26 MG PO TABS: 1.0000 | ORAL_TABLET | Freq: Two times a day (BID) | ORAL | 3 refills | Status: DC

## 2019-05-04 NOTE — Telephone Encounter (Signed)
Pt's wife made aware. Voiced understanding. Made her aware of repeat labs. Samples gives e xp/jul 2022 Lot [id number]

## 2019-05-04 NOTE — Telephone Encounter (Signed)
-----   Message from Satira Sark, MD sent at 04/30/2019 12:12 PM EDT ----- Results reviewed.  LVEF approximately 25% with other stable findings including evidence of old LV mural thrombus.  As we discussed at the recent office visit, anticipate medication adjustments.  Would have him stop Benicar and start Entresto 24/26 mg twice daily.  He will need a follow-up BMET in 7 to 10 days and then office visit in the next 6 weeks.

## 2019-05-06 ENCOUNTER — Other Ambulatory Visit: Payer: Self-pay

## 2019-05-06 ENCOUNTER — Ambulatory Visit (INDEPENDENT_AMBULATORY_CARE_PROVIDER_SITE_OTHER): Payer: Medicare Other | Admitting: *Deleted

## 2019-05-06 DIAGNOSIS — I6349 Cerebral infarction due to embolism of other cerebral artery: Secondary | ICD-10-CM | POA: Diagnosis not present

## 2019-05-06 DIAGNOSIS — Z5181 Encounter for therapeutic drug level monitoring: Secondary | ICD-10-CM | POA: Diagnosis not present

## 2019-05-06 DIAGNOSIS — Z7901 Long term (current) use of anticoagulants: Secondary | ICD-10-CM | POA: Diagnosis not present

## 2019-05-06 DIAGNOSIS — Z86718 Personal history of other venous thrombosis and embolism: Secondary | ICD-10-CM | POA: Diagnosis not present

## 2019-05-06 DIAGNOSIS — I6350 Cerebral infarction due to unspecified occlusion or stenosis of unspecified cerebral artery: Secondary | ICD-10-CM | POA: Diagnosis not present

## 2019-05-06 DIAGNOSIS — I639 Cerebral infarction, unspecified: Secondary | ICD-10-CM

## 2019-05-06 LAB — POCT INR: INR: 3.2 — AB (ref 2.0–3.0)

## 2019-05-06 NOTE — Patient Instructions (Signed)
Hold coumadin tonight then resume 1 tablet daily except 2 tablets on Mondays and Fridays.  Recheck in 6 weeks.

## 2019-05-07 DIAGNOSIS — B351 Tinea unguium: Secondary | ICD-10-CM | POA: Diagnosis not present

## 2019-05-07 DIAGNOSIS — E1142 Type 2 diabetes mellitus with diabetic polyneuropathy: Secondary | ICD-10-CM | POA: Diagnosis not present

## 2019-05-27 DIAGNOSIS — E782 Mixed hyperlipidemia: Secondary | ICD-10-CM | POA: Diagnosis not present

## 2019-05-27 DIAGNOSIS — E1122 Type 2 diabetes mellitus with diabetic chronic kidney disease: Secondary | ICD-10-CM | POA: Diagnosis not present

## 2019-05-27 DIAGNOSIS — I5032 Chronic diastolic (congestive) heart failure: Secondary | ICD-10-CM | POA: Diagnosis not present

## 2019-05-27 DIAGNOSIS — I10 Essential (primary) hypertension: Secondary | ICD-10-CM | POA: Diagnosis not present

## 2019-06-17 DIAGNOSIS — I5032 Chronic diastolic (congestive) heart failure: Secondary | ICD-10-CM | POA: Diagnosis not present

## 2019-06-17 DIAGNOSIS — E1122 Type 2 diabetes mellitus with diabetic chronic kidney disease: Secondary | ICD-10-CM | POA: Diagnosis not present

## 2019-06-17 DIAGNOSIS — I10 Essential (primary) hypertension: Secondary | ICD-10-CM | POA: Diagnosis not present

## 2019-06-17 DIAGNOSIS — E782 Mixed hyperlipidemia: Secondary | ICD-10-CM | POA: Diagnosis not present

## 2019-06-17 NOTE — Progress Notes (Signed)
Cardiology Office Note  Date: 06/18/2019   ID: [name], [name] [birth year], MRN [id number]  PCP:  [name], MD  Cardiologist:  Rozann Lesches, MD Electrophysiologist:  None   Chief Complaint  Patient presents with  . Cardiac follow-up    History of Present Illness: Dennis Yu is a 73 y.o. male last seen in September.  He presents for follow-up after recent medication adjustments.  He states that his insurance will not cover Entresto, we are going to check into this with the rep.  He otherwise tolerated the switch from Benicar.  He is on Coumadin with follow-up in the anticoagulation clinic.  He does not report any spontaneous bleeding problems.  He is due for follow-up BMET.  We will plan to get this arranged.  He tells me that he was having some trouble with diarrhea in the interim, came off of Metformin, Centrum, and omega-3 supplements.  He thinks that it was most likely related to the Metformin which he has not resumed.  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  Past Medical History:  Diagnosis Date  . Bronchitis 11/2012  . Cardiomyopathy    LVEF 25%  . Diabetes mellitus   . Erectile dysfunction   . Essential hypertension   . GERD (gastroesophageal reflux disease)   . History of colonic polyps   . Mixed hyperlipidemia   . 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 Obstructive sleep apnea   . Patent foramen ovale   . Phlebitis   . 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 Superficial phlebitis 12/10/2012  . Type 2 diabetes mellitus ([place])   . Vitamin D deficiency     History reviewed. No pertinent surgical history.  Current Outpatient Medications  Medication Sig Dispense Refill  . aspirin 81 MG tablet Take 81 mg by mouth  daily.      . carvedilol (COREG) 12.5 MG tablet TAKE 1 TABLET TWICE A DAY  WITH MEALS 180 tablet 3  . furosemide (LASIX) 40 MG tablet Take 40 mg by mouth daily.    [name] gabapentin (NEURONTIN) 300 MG capsule Take 3 capsules (900 mg total) by mouth daily. 90 capsule 11  . glipiZIDE (GLUCOTROL) 5 MG tablet Take 5 mg by mouth daily.     . NON FORMULARY legatrin pm Daily      . Omega-3 Fatty Acids (FISH OIL) 1000 MG CAPS Take by mouth.    [name] PROAIR HFA 108 (90 BASE) MCG/ACT inhaler Inhale 2 puffs into the lungs 4 (four) times daily.     . sacubitril-valsartan (ENTRESTO) 24-26 MG Take 1 tablet by mouth 2 (two) times daily. Please Honor Card patient is presenting for Dennis Yu: [id number]; Dennis Yu: [id number]; [id number]; ISSUER: [id number] ID: [id number] 60 tablet 3  . simvastatin (ZOCOR) 40 MG tablet Take 40 mg by mouth daily at 6 PM.     . warfarin (JANTOVEN) 5 MG tablet TAKE 1 TABLET DAILY EXCEPT 2 TABLETS ON MONDAYS AND   FRIDAYS 120 tablet 1   No current facility-administered medications for this visit.    Allergies:  Patient has no known allergies.   Social History: The patient  reports that he quit smoking about 15 years ago. His smoking use included cigarettes. He started smoking about 51 years ago. He has never  used smokeless tobacco. He reports that he does not drink alcohol or use drugs.   ROS:  Please see the history of present illness. Otherwise, complete review of systems is positive for none.  All other systems are reviewed and negative.   Physical Exam: VS:  BP 131/83   Pulse (!) 59   Temp 97.6 F (36.4 C)   Ht 6' (1.829 m)   Wt 197 lb (89.4 kg)   SpO2 97%   BMI 26.72 kg/m , BMI Body mass index is 26.72 kg/m.  Wt Readings from Last 3 Encounters:  06/18/19 197 lb (89.4 kg)  04/23/19 199 lb (90.3 kg)  04/22/19 198 lb (89.8 kg)    General: Patient appears comfortable at rest.  Uses a cane. HEENT: Conjunctiva and lids normal, wearing a mask. Neck: Supple, no elevated JVP or  carotid bruits, no thyromegaly. Lungs: Clear to auscultation, nonlabored breathing at rest. Cardiac: Regular rate and rhythm, no S3, 2/6 systolic murmur. Abdomen: Soft, nontender, bowel sounds present. Extremities: No pitting edema with compression stockings, distal pulses 2+. Skin: Warm and dry. Musculoskeletal: No kyphosis. Neuropsychiatric: Alert and oriented x3, affect grossly appropriate.  Residual left-sided weakness as before.  ECG:  An ECG dated 04/29/2018 was personally reviewed today and demonstrated:  Sinus rhythm with old anterolateral/inferior infarct pattern and repolarization abnormalities.  Recent Labwork: 04/23/2019: ALT 22; AST 27; BUN 25; Creatinine, Ser 1.38; Hemoglobin 12.2; Platelets 196; Potassium 4.2; Sodium 138   Other Studies Reviewed Today:  Echocardiogram 04/30/2019:  1. Left ventricular ejection fraction, by visual estimation, is 25%. The left ventricle has severely decreased function. Moderately increased left ventricular size. There is mildly increased left ventricular hypertrophy.  2. Multiple segmental abnormalities exist. See findings.  3. Definity contrast agent was given IV to delineate the left ventricular endocardial borders. 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 Inferior apical calcification consistent with old thrombus.  4. Left ventricular diastolic Doppler parameters are consistent with pseudonormalization pattern of LV diastolic filling.  5. Global right ventricle has normal systolic function.The right ventricular size is normal. No increase in right ventricular wall thickness.  6. Left atrial size was severely dilated.  7. Right atrial size was normal.  8. Mild to moderate aortic valve annular calcification.  9. The mitral valve is grossly normal. Mild mitral valve regurgitation. 10. The tricuspid valve is grossly normal. Tricuspid valve regurgitation is trivial. 11. The aortic valve is tricuspid  Aortic valve regurgitation was not visualized by color flow Doppler. 12. The pulmonic valve was grossly normal. Pulmonic valve regurgitation is trivial by color flow Doppler. 13. Normal pulmonary artery systolic pressure. 14. The inferior vena cava is normal in size with greater than 50% respiratory variability, suggesting right atrial pressure of 3 mmHg. 15. The tricuspid regurgitant velocity is 2.33 m/s, and with an assumed right atrial pressure of 3 mmHg, the estimated right ventricular systolic pressure is normal at 24.7 mmHg.  Assessment and Plan:  1.  Ischemic cardiomyopathy with LVEF 25%.  We have attempted medication adjustments, switched from Benicar to [employer] is not covering, we are going to check into this with the rep.  He continues on stable doses of Coreg and Lasix.  Follow-up BMET will be obtained.  Depending on whether his Delene Loll can be covered or not, we will have to make further adjustments from there, ultimately I would like to start him on Aldactone as well depending on renal function.  2.  CKD stage III, last creatinine  1.38 in September.  3.  Mixed hyperlipidemia, continues on Zocor and has follow-up with PCP next month.  Goal LDL under 70.  4.  History of stroke and inconsistent documentation of PFO.  He remains on Coumadin with follow-up in the anticoagulation clinic.  Medication Adjustments/Labs and Tests Ordered: Current medicines are reviewed at length with the patient today.  Concerns regarding medicines are outlined above.   Tests Ordered: Orders Placed This Encounter  Procedures  . Basic Metabolic Panel (BMET)    Medication Changes: No orders of the defined types were placed in this encounter.   Disposition:  Follow up 4 to 6 weeks.  Signed, Satira Sark, MD, [facility] 06/18/2019 8:44 AM    [place] [facility] [place]. [address] [place], [place] [address] Phone: [phone]; Fax: [phone]

## 2019-06-18 ENCOUNTER — Encounter: Payer: Self-pay | Admitting: Cardiology

## 2019-06-18 ENCOUNTER — Other Ambulatory Visit: Payer: Self-pay

## 2019-06-18 ENCOUNTER — Ambulatory Visit (INDEPENDENT_AMBULATORY_CARE_PROVIDER_SITE_OTHER): Payer: Medicare Other | Admitting: Cardiology

## 2019-06-18 VITALS — BP 131/83 | HR 59 | Temp 97.6°F | Ht 72.0 in | Wt 197.0 lb

## 2019-06-18 DIAGNOSIS — E782 Mixed hyperlipidemia: Secondary | ICD-10-CM | POA: Diagnosis not present

## 2019-06-18 DIAGNOSIS — I255 Ischemic cardiomyopathy: Secondary | ICD-10-CM

## 2019-06-18 DIAGNOSIS — Z8673 Personal history of transient ischemic attack (TIA), and cerebral infarction without residual deficits: Secondary | ICD-10-CM | POA: Diagnosis not present

## 2019-06-18 DIAGNOSIS — I6350 Cerebral infarction due to unspecified occlusion or stenosis of unspecified cerebral artery: Secondary | ICD-10-CM | POA: Diagnosis not present

## 2019-06-18 DIAGNOSIS — I5042 Chronic combined systolic (congestive) and diastolic (congestive) heart failure: Secondary | ICD-10-CM

## 2019-06-18 NOTE — Patient Instructions (Signed)
Medication Instructions:  Your physician recommends that you continue on your current medications as directed. Please refer to the Current Medication list given to you today.  *If you need a refill on your cardiac medications before your next appointment, please call your pharmacy*  Lab Work: BMET now  If you have labs (blood work) drawn today and your tests are completely normal, you will receive your results only by: [name] MyChart Message (if you have MyChart) OR . A paper copy in the mail If you have any lab test that is abnormal or we need to change your treatment, we will call you to review the results.  Testing/Procedures: None today  Follow-Up: At [facility], you and your health needs are our priority.  As part of our continuing mission to provide you with exceptional heart care, we have created designated Provider Care Teams.  These Care Teams include your primary Cardiologist (physician) and Advanced Practice Providers (APPs -  Physician Assistants and Nurse Practitioners) who all work together to provide you with the care you need, when you need it.  Your next appointment:   4-6 weeks  The format for your next appointment:   In Person  Provider:   Dr.McDowell or Bernerd Pho PA-C  Other Instructions None     Thank you for choosing [place] !

## 2019-06-21 ENCOUNTER — Other Ambulatory Visit: Payer: Self-pay

## 2019-06-21 ENCOUNTER — Other Ambulatory Visit (HOSPITAL_COMMUNITY)
Admission: RE | Admit: 2019-06-21 | Discharge: 2019-06-21 | Disposition: A | Discharge status: Home / Self Care | Payer: Medicare Other | Source: Ambulatory Visit | Attending: Cardiology | Admitting: Cardiology

## 2019-06-21 ENCOUNTER — Ambulatory Visit (INDEPENDENT_AMBULATORY_CARE_PROVIDER_SITE_OTHER): Payer: Medicare Other | Admitting: *Deleted

## 2019-06-21 DIAGNOSIS — I639 Cerebral infarction, unspecified: Secondary | ICD-10-CM | POA: Diagnosis not present

## 2019-06-21 DIAGNOSIS — I255 Ischemic cardiomyopathy: Secondary | ICD-10-CM | POA: Insufficient documentation

## 2019-06-21 DIAGNOSIS — Z7901 Long term (current) use of anticoagulants: Secondary | ICD-10-CM | POA: Diagnosis not present

## 2019-06-21 DIAGNOSIS — Z86718 Personal history of other venous thrombosis and embolism: Secondary | ICD-10-CM | POA: Diagnosis not present

## 2019-06-21 DIAGNOSIS — I5042 Chronic combined systolic (congestive) and diastolic (congestive) heart failure: Secondary | ICD-10-CM | POA: Insufficient documentation

## 2019-06-21 DIAGNOSIS — Z5181 Encounter for therapeutic drug level monitoring: Secondary | ICD-10-CM | POA: Diagnosis not present

## 2019-06-21 DIAGNOSIS — I6350 Cerebral infarction due to unspecified occlusion or stenosis of unspecified cerebral artery: Secondary | ICD-10-CM | POA: Diagnosis not present

## 2019-06-21 LAB — BASIC METABOLIC PANEL
Anion gap: 9 (ref 5–15)
BUN: 21 mg/dL (ref 8–23)
CO2: 23 mmol/L (ref 22–32)
Calcium: 9 mg/dL (ref 8.9–10.3)
Chloride: 108 mmol/L (ref 98–111)
Creatinine, Ser: 1.34 mg/dL — ABNORMAL HIGH (ref 0.61–1.24)
GFR calc Af Amer: >60 mL/min (ref >60)
GFR calc non Af Amer: 53 mL/min — ABNORMAL LOW (ref >60)
Glucose, Bld: 126 mg/dL — ABNORMAL HIGH (ref 70–99)
Potassium: 4.6 mmol/L (ref 3.5–5.1)
Sodium: 140 mmol/L (ref 135–145)

## 2019-06-21 LAB — POCT INR: INR: 3.8 — AB (ref 2.0–3.0)

## 2019-06-21 NOTE — Patient Instructions (Signed)
Hold coumadin tonight then decrease dose to 1 tablet daily except 2 tablets on Fridays.  Recheck in 4 weeks.

## 2019-06-30 ENCOUNTER — Other Ambulatory Visit: Payer: Self-pay

## 2019-07-13 DIAGNOSIS — I10 Essential (primary) hypertension: Secondary | ICD-10-CM | POA: Diagnosis not present

## 2019-07-13 DIAGNOSIS — I482 Chronic atrial fibrillation, unspecified: Secondary | ICD-10-CM | POA: Diagnosis not present

## 2019-07-13 DIAGNOSIS — E119 Type 2 diabetes mellitus without complications: Secondary | ICD-10-CM | POA: Diagnosis not present

## 2019-07-13 DIAGNOSIS — E782 Mixed hyperlipidemia: Secondary | ICD-10-CM | POA: Diagnosis not present

## 2019-07-13 DIAGNOSIS — E1122 Type 2 diabetes mellitus with diabetic chronic kidney disease: Secondary | ICD-10-CM | POA: Diagnosis not present

## 2019-07-16 DIAGNOSIS — E1142 Type 2 diabetes mellitus with diabetic polyneuropathy: Secondary | ICD-10-CM | POA: Diagnosis not present

## 2019-07-16 DIAGNOSIS — B351 Tinea unguium: Secondary | ICD-10-CM | POA: Diagnosis not present

## 2019-07-20 ENCOUNTER — Ambulatory Visit (INDEPENDENT_AMBULATORY_CARE_PROVIDER_SITE_OTHER): Payer: Medicare Other | Admitting: *Deleted

## 2019-07-20 ENCOUNTER — Other Ambulatory Visit: Payer: Self-pay

## 2019-07-20 DIAGNOSIS — G9009 Other idiopathic peripheral autonomic neuropathy: Secondary | ICD-10-CM | POA: Diagnosis not present

## 2019-07-20 DIAGNOSIS — Z5181 Encounter for therapeutic drug level monitoring: Secondary | ICD-10-CM | POA: Diagnosis not present

## 2019-07-20 DIAGNOSIS — I639 Cerebral infarction, unspecified: Secondary | ICD-10-CM

## 2019-07-20 DIAGNOSIS — Z86718 Personal history of other venous thrombosis and embolism: Secondary | ICD-10-CM

## 2019-07-20 DIAGNOSIS — I129 Hypertensive chronic kidney disease with stage 1 through stage 4 chronic kidney disease, or unspecified chronic kidney disease: Secondary | ICD-10-CM | POA: Diagnosis not present

## 2019-07-20 DIAGNOSIS — I6350 Cerebral infarction due to unspecified occlusion or stenosis of unspecified cerebral artery: Secondary | ICD-10-CM

## 2019-07-20 DIAGNOSIS — Z0001 Encounter for general adult medical examination with abnormal findings: Secondary | ICD-10-CM | POA: Diagnosis not present

## 2019-07-20 DIAGNOSIS — Z7901 Long term (current) use of anticoagulants: Secondary | ICD-10-CM

## 2019-07-20 DIAGNOSIS — D509 Iron deficiency anemia, unspecified: Secondary | ICD-10-CM | POA: Diagnosis not present

## 2019-07-20 DIAGNOSIS — Z8673 Personal history of transient ischemic attack (TIA), and cerebral infarction without residual deficits: Secondary | ICD-10-CM | POA: Diagnosis not present

## 2019-07-20 DIAGNOSIS — G4733 Obstructive sleep apnea (adult) (pediatric): Secondary | ICD-10-CM | POA: Diagnosis not present

## 2019-07-20 DIAGNOSIS — E1122 Type 2 diabetes mellitus with diabetic chronic kidney disease: Secondary | ICD-10-CM | POA: Diagnosis not present

## 2019-07-20 DIAGNOSIS — I5032 Chronic diastolic (congestive) heart failure: Secondary | ICD-10-CM | POA: Diagnosis not present

## 2019-07-20 DIAGNOSIS — E782 Mixed hyperlipidemia: Secondary | ICD-10-CM | POA: Diagnosis not present

## 2019-07-20 DIAGNOSIS — I482 Chronic atrial fibrillation, unspecified: Secondary | ICD-10-CM | POA: Diagnosis not present

## 2019-07-20 LAB — POCT INR: INR: 2.4 (ref 2.0–3.0)

## 2019-07-20 NOTE — Patient Instructions (Signed)
Continue warfarin 1 tablet daily except 2 tablets on Fridays.  Recheck in 4 weeks.

## 2019-07-23 ENCOUNTER — Other Ambulatory Visit: Payer: Self-pay

## 2019-07-23 ENCOUNTER — Ambulatory Visit (INDEPENDENT_AMBULATORY_CARE_PROVIDER_SITE_OTHER): Payer: Medicare Other | Admitting: Cardiology

## 2019-07-23 ENCOUNTER — Encounter: Payer: Self-pay | Admitting: Cardiology

## 2019-07-23 VITALS — BP 128/78 | HR 62 | Temp 97.1°F | Ht 72.0 in | Wt 205.0 lb

## 2019-07-23 DIAGNOSIS — E782 Mixed hyperlipidemia: Secondary | ICD-10-CM | POA: Diagnosis not present

## 2019-07-23 DIAGNOSIS — I5042 Chronic combined systolic (congestive) and diastolic (congestive) heart failure: Secondary | ICD-10-CM | POA: Diagnosis not present

## 2019-07-23 DIAGNOSIS — I6350 Cerebral infarction due to unspecified occlusion or stenosis of unspecified cerebral artery: Secondary | ICD-10-CM

## 2019-07-23 DIAGNOSIS — I255 Ischemic cardiomyopathy: Secondary | ICD-10-CM

## 2019-07-23 MED ORDER — OLMESARTAN MEDOXOMIL 20 MG PO TABS: 20.0000 mg | ORAL_TABLET | Freq: Every day | ORAL | 3 refills | Status: DC

## 2019-07-23 MED ORDER — SPIRONOLACTONE 25 MG PO TABS: 12.5000 mg | ORAL_TABLET | Freq: Every day | ORAL | 3 refills | Status: DC

## 2019-07-23 NOTE — Patient Instructions (Addendum)
Medication Instructions:  Take Benicar 20 mg daily, I refilled your prescription   START Aldactone 12.5 mg daily    *If you need a refill on your cardiac medications before your next appointment, please call your pharmacy*  Lab Work: BMET JUST BEFORE NEXT VISIT   If you have labs (blood work) drawn today and your tests are completely normal, you will receive your results only by: [name] MyChart Message (if you have MyChart) OR . A paper copy in the mail If you have any lab test that is abnormal or we need to change your treatment, we will call you to review the results.  Testing/Procedures: None today  Follow-Up: At [facility], you and your health needs are our priority.  As part of our continuing mission to provide you with exceptional heart care, we have created designated Provider Care Teams.  These Care Teams include your primary Cardiologist (physician) and Advanced Practice Providers (APPs -  Physician Assistants and Nurse Practitioners) who all work together to provide you with the care you need, when you need it.  Your next appointment:   3 month(s)  The format for your next appointment:   In Person  Provider:   Rozann Lesches, MD  Other Instructions None Thank you for choosing [place] !

## 2019-07-23 NOTE — Progress Notes (Signed)
Cardiology Office Note  Date: 07/23/2019   ID: [name], Dennis Yu, MRN [id number]  PCP:  [name], MD  Cardiologist:  Rozann Lesches, MD Electrophysiologist:  None   Chief Complaint  Patient presents with  . Cardiac follow-up    History of Present Illness: Dennis Yu is a 73 y.o. male last seen in November.  He presents for a follow-up visit.  He ultimately stopped Entresto, not well covered under his insurance plan and he states that it was too expensive monthly.  He went back on Benicar 20 mg daily.  He reports NYHA class II dyspnea at this time, no palpitations or syncope, no progressive angina.  He remains on Coumadin with follow-up in anticoagulation clinic.  Recent INR was 2.4.  No reported bleeding problems.  I reviewed his lab work from November as outlined below.  Also requesting interval lab work from PCP.  We discussed the current situation, we will plan to continue with medical therapy, add low-dose Aldactone.  No invasive testing at this point as has been his preference.  Past Medical History:  Diagnosis Date  . Bronchitis 11/2012  . Cardiomyopathy    LVEF 25%  . Diabetes mellitus   . Erectile dysfunction   . Essential hypertension   . GERD (gastroesophageal reflux disease)   . History of colonic polyps   . Mixed hyperlipidemia   . 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 Obstructive sleep apnea   . Patent foramen ovale   . Phlebitis   . 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 Superficial phlebitis 12/10/2012  . Type 2 diabetes mellitus ([place])   . Vitamin D deficiency     History reviewed. No pertinent surgical history.  Current Outpatient Medications  Medication Sig Dispense Refill  . aspirin 81 MG tablet Take 81 mg by mouth daily.      . carvedilol (COREG) 12.5 MG tablet TAKE 1 TABLET TWICE A DAY  WITH MEALS 180 tablet 3  . furosemide (LASIX) 40 MG tablet Take 40 mg by mouth daily.    [name] gabapentin (NEURONTIN) 300  MG capsule Take 3 capsules (900 mg total) by mouth daily. 90 capsule 11  . glipiZIDE (GLUCOTROL) 5 MG tablet Take 5 mg by mouth daily.     . NON FORMULARY legatrin pm Daily      . Omega-3 Fatty Acids (FISH OIL) 1000 MG CAPS Take by mouth.    [name] PROAIR HFA 108 (90 BASE) MCG/ACT inhaler Inhale 2 puffs into the lungs 4 (four) times daily.     . simvastatin (ZOCOR) 40 MG tablet Take 40 mg by mouth daily at 6 PM.     . warfarin (JANTOVEN) 5 MG tablet TAKE 1 TABLET DAILY EXCEPT 2 TABLETS ON MONDAYS AND   FRIDAYS 120 tablet 1  . olmesartan (BENICAR) 20 MG tablet Take 1 tablet (20 mg total) by mouth daily. 90 tablet 3  . spironolactone (ALDACTONE) 25 MG tablet Take 0.5 tablets (12.5 mg total) by mouth daily. 45 tablet 3   No current facility-administered medications for this visit.   Allergies:  Patient has no known allergies.   Social History: The patient  reports that he quit smoking about 15 years ago. His smoking use included cigarettes. He started smoking about 51 years ago. He has never used smokeless tobacco. He reports that he does not drink alcohol or use drugs.   ROS:  Please see the history of  present illness. Otherwise, complete review of systems is positive for none.  All other systems are reviewed and negative.   Physical Exam: VS:  BP 128/78   Pulse 62   Temp (!) 97.1 F (36.2 C) (Skin)   Ht 6' (1.829 m)   Wt 205 lb (93 kg)   SpO2 98%   BMI 27.80 kg/m , BMI Body mass index is 27.8 kg/m.  Wt Readings from Last 3 Encounters:  07/23/19 205 lb (93 kg)  06/18/19 197 lb (89.4 kg)  04/23/19 199 lb (90.3 kg)    General: Patient appears comfortable at rest.  Uses a cane. HEENT: Conjunctiva and lids normal, wearing a mask. Neck: Supple, no elevated JVP or carotid bruits, no thyromegaly. Lungs: Clear to auscultation, nonlabored breathing at rest. Cardiac: Regular rate and rhythm, no S3, 2/6 systolic murmur. Abdomen: Soft, nontender, bowel sounds present. Extremities: No pitting  edema, distal pulses 2+. Skin: Warm and dry. Musculoskeletal: No kyphosis. Neuropsychiatric: Alert and oriented x3, affect grossly appropriate.  ECG:  An ECG dated 04/29/2018 was personally reviewed today and demonstrated:  Sinus rhythm with old anterior lateral/inferior infarct pattern and repolarization abnormalities.  Recent Labwork: 04/23/2019: ALT 22; AST 27; Hemoglobin 12.2; Platelets 196 06/21/2019: BUN 21; Creatinine, Ser 1.34; Potassium 4.6; Sodium 140   Other Studies Reviewed Today:  Echocardiogram 04/30/2019: 1. Left ventricular ejection fraction, by visual estimation, is 25%. The left ventricle has severely decreased function. Moderately increased left ventricular size. There is mildly increased left ventricular hypertrophy. 2. Multiple segmental abnormalities exist. See findings. 3. Definity contrast agent was given IV to delineate the left ventricular endocardial borders. 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 Inferior apical calcification consistent with old thrombus. 4. Left ventricular diastolic Doppler parameters are consistent with pseudonormalization pattern of LV diastolic filling. 5. Global right ventricle has normal systolic function.The right ventricular size is normal. No increase in right ventricular wall thickness. 6. Left atrial size was severely dilated. 7. Right atrial size was normal. 8. Mild to moderate aortic valve annular calcification. 9. The mitral valve is grossly normal. Mild mitral valve regurgitation. 10. The tricuspid valve is grossly normal. Tricuspid valve regurgitation is trivial. 11. The aortic valve is tricuspid Aortic valve regurgitation was not visualized by color flow Doppler. 12. The pulmonic valve was grossly normal. Pulmonic valve regurgitation is trivial by color flow Doppler. 13. Normal pulmonary artery systolic pressure. 14. The inferior vena cava is normal in size with greater than  50% respiratory variability, suggesting right atrial pressure of 3 mmHg. 15. The tricuspid regurgitant velocity is 2.33 m/s, and with an assumed right atrial pressure of 3 mmHg, the estimated right ventricular systolic pressure is normal at 24.7 mmHg.  Assessment and Plan:  1.  Ischemic cardiomyopathy with LVEF approximately 25%.  He was not able to stay on Entresto due to cost and is now back on Benicar 20 mg daily. Continue Coreg and Lasix, also add Aldactone 12.5 mg daily.  As noted previously, he prefers conservative management without invasive testing.  Follow-up BMET for next visit.  2.  CKD stage III, last creatinine 1.34.  Requesting interval lab work from PCP.  3.  Mixed hyperlipidemia on Zocor.  4.  Chronic systolic heart failure, symptomatically stable at this time.  He remains on stable dose of Lasix, no leg swelling, orthopnea or PND.  5.  History of stroke and inconsistent documentation of PFO.  He remains on Coumadin.  Medication Adjustments/Labs and Tests Ordered: Current medicines are reviewed  at length with the patient today.  Concerns regarding medicines are outlined above.   Tests Ordered: Orders Placed This Encounter  Procedures  . Basic Metabolic Panel (BMET)    Medication Changes: Meds ordered this encounter  Medications  . olmesartan (BENICAR) 20 MG tablet    Sig: Take 1 tablet (20 mg total) by mouth daily.    Dispense:  90 tablet    Refill:  3  . spironolactone (ALDACTONE) 25 MG tablet    Sig: Take 0.5 tablets (12.5 mg total) by mouth daily.    Dispense:  45 tablet    Refill:  3    Disposition:  Follow up 3 months in the [place] office.  Signed, Satira Sark, MD, [facility] 07/23/2019 2:31 PM    [place] [facility] [address] [place], [place] [address] Phone: [phone]; Fax: [phone]

## 2019-08-07 DIAGNOSIS — Z23 Encounter for immunization: Secondary | ICD-10-CM | POA: Diagnosis not present

## 2019-08-09 ENCOUNTER — Other Ambulatory Visit: Payer: Self-pay | Admitting: Cardiovascular Disease

## 2019-08-12 DIAGNOSIS — I130 Hypertensive heart and chronic kidney disease with heart failure and stage 1 through stage 4 chronic kidney disease, or unspecified chronic kidney disease: Secondary | ICD-10-CM | POA: Diagnosis not present

## 2019-08-12 DIAGNOSIS — E1122 Type 2 diabetes mellitus with diabetic chronic kidney disease: Secondary | ICD-10-CM | POA: Diagnosis not present

## 2019-08-12 DIAGNOSIS — I10 Essential (primary) hypertension: Secondary | ICD-10-CM | POA: Diagnosis not present

## 2019-08-12 DIAGNOSIS — N183 Chronic kidney disease, stage 3 unspecified: Secondary | ICD-10-CM | POA: Diagnosis not present

## 2019-08-12 DIAGNOSIS — I5032 Chronic diastolic (congestive) heart failure: Secondary | ICD-10-CM | POA: Diagnosis not present

## 2019-08-12 DIAGNOSIS — E782 Mixed hyperlipidemia: Secondary | ICD-10-CM | POA: Diagnosis not present

## 2019-08-17 ENCOUNTER — Other Ambulatory Visit: Payer: Self-pay

## 2019-08-17 ENCOUNTER — Ambulatory Visit (INDEPENDENT_AMBULATORY_CARE_PROVIDER_SITE_OTHER): Payer: Medicare Other | Admitting: *Deleted

## 2019-08-17 DIAGNOSIS — I6350 Cerebral infarction due to unspecified occlusion or stenosis of unspecified cerebral artery: Secondary | ICD-10-CM

## 2019-08-17 DIAGNOSIS — Z86718 Personal history of other venous thrombosis and embolism: Secondary | ICD-10-CM

## 2019-08-17 DIAGNOSIS — I6349 Cerebral infarction due to embolism of other cerebral artery: Secondary | ICD-10-CM

## 2019-08-17 DIAGNOSIS — I639 Cerebral infarction, unspecified: Secondary | ICD-10-CM | POA: Diagnosis not present

## 2019-08-17 DIAGNOSIS — Z7901 Long term (current) use of anticoagulants: Secondary | ICD-10-CM

## 2019-08-17 DIAGNOSIS — Z5181 Encounter for therapeutic drug level monitoring: Secondary | ICD-10-CM

## 2019-08-17 LAB — POCT INR: INR: 2.4 (ref 2.0–3.0)

## 2019-08-17 NOTE — Patient Instructions (Signed)
Continue warfarin 1 tablet daily except 2 tablets on Fridays.  Recheck in 6 weeks.

## 2019-09-24 DIAGNOSIS — B351 Tinea unguium: Secondary | ICD-10-CM | POA: Diagnosis not present

## 2019-09-24 DIAGNOSIS — E1142 Type 2 diabetes mellitus with diabetic polyneuropathy: Secondary | ICD-10-CM | POA: Diagnosis not present

## 2019-09-28 ENCOUNTER — Ambulatory Visit (INDEPENDENT_AMBULATORY_CARE_PROVIDER_SITE_OTHER): Payer: Medicare Other | Admitting: *Deleted

## 2019-09-28 ENCOUNTER — Other Ambulatory Visit: Payer: Self-pay

## 2019-09-28 DIAGNOSIS — I639 Cerebral infarction, unspecified: Secondary | ICD-10-CM | POA: Diagnosis not present

## 2019-09-28 DIAGNOSIS — Z7901 Long term (current) use of anticoagulants: Secondary | ICD-10-CM | POA: Diagnosis not present

## 2019-09-28 DIAGNOSIS — Z5181 Encounter for therapeutic drug level monitoring: Secondary | ICD-10-CM

## 2019-09-28 DIAGNOSIS — Z86718 Personal history of other venous thrombosis and embolism: Secondary | ICD-10-CM

## 2019-09-28 DIAGNOSIS — I6350 Cerebral infarction due to unspecified occlusion or stenosis of unspecified cerebral artery: Secondary | ICD-10-CM

## 2019-09-28 LAB — POCT INR: INR: 2.5 (ref 2.0–3.0)

## 2019-09-28 NOTE — Patient Instructions (Signed)
Continue warfarin 1 tablet daily except 2 tablets on Fridays.  Recheck in 6 weeks.

## 2019-09-30 DIAGNOSIS — I130 Hypertensive heart and chronic kidney disease with heart failure and stage 1 through stage 4 chronic kidney disease, or unspecified chronic kidney disease: Secondary | ICD-10-CM | POA: Diagnosis not present

## 2019-09-30 DIAGNOSIS — N189 Chronic kidney disease, unspecified: Secondary | ICD-10-CM | POA: Diagnosis not present

## 2019-09-30 DIAGNOSIS — I10 Essential (primary) hypertension: Secondary | ICD-10-CM | POA: Diagnosis not present

## 2019-09-30 DIAGNOSIS — E782 Mixed hyperlipidemia: Secondary | ICD-10-CM | POA: Diagnosis not present

## 2019-09-30 DIAGNOSIS — I5032 Chronic diastolic (congestive) heart failure: Secondary | ICD-10-CM | POA: Diagnosis not present

## 2019-09-30 DIAGNOSIS — E1122 Type 2 diabetes mellitus with diabetic chronic kidney disease: Secondary | ICD-10-CM | POA: Diagnosis not present

## 2019-10-22 ENCOUNTER — Encounter (HOSPITAL_COMMUNITY): Payer: Self-pay | Admitting: Lab

## 2019-10-22 ENCOUNTER — Inpatient Hospital Stay (HOSPITAL_COMMUNITY): Payer: Medicare Other

## 2019-10-22 ENCOUNTER — Other Ambulatory Visit: Payer: Self-pay

## 2019-10-22 ENCOUNTER — Inpatient Hospital Stay (HOSPITAL_COMMUNITY): Payer: Medicare Other | Attending: Nurse Practitioner | Admitting: Nurse Practitioner

## 2019-10-22 DIAGNOSIS — I10 Essential (primary) hypertension: Secondary | ICD-10-CM | POA: Diagnosis not present

## 2019-10-22 DIAGNOSIS — E782 Mixed hyperlipidemia: Secondary | ICD-10-CM | POA: Insufficient documentation

## 2019-10-22 DIAGNOSIS — Z87891 Personal history of nicotine dependence: Secondary | ICD-10-CM | POA: Diagnosis not present

## 2019-10-22 DIAGNOSIS — E119 Type 2 diabetes mellitus without complications: Secondary | ICD-10-CM | POA: Diagnosis not present

## 2019-10-22 DIAGNOSIS — Z79899 Other long term (current) drug therapy: Secondary | ICD-10-CM | POA: Insufficient documentation

## 2019-10-22 DIAGNOSIS — Z7984 Long term (current) use of oral hypoglycemic drugs: Secondary | ICD-10-CM | POA: Diagnosis not present

## 2019-10-22 DIAGNOSIS — Z7901 Long term (current) use of anticoagulants: Secondary | ICD-10-CM | POA: Insufficient documentation

## 2019-10-22 DIAGNOSIS — Z833 Family history of diabetes mellitus: Secondary | ICD-10-CM | POA: Insufficient documentation

## 2019-10-22 DIAGNOSIS — R109 Unspecified abdominal pain: Secondary | ICD-10-CM | POA: Insufficient documentation

## 2019-10-22 DIAGNOSIS — Z8249 Family history of ischemic heart disease and other diseases of the circulatory system: Secondary | ICD-10-CM | POA: Diagnosis not present

## 2019-10-22 DIAGNOSIS — Z7982 Long term (current) use of aspirin: Secondary | ICD-10-CM | POA: Insufficient documentation

## 2019-10-22 DIAGNOSIS — D509 Iron deficiency anemia, unspecified: Secondary | ICD-10-CM

## 2019-10-22 DIAGNOSIS — N189 Chronic kidney disease, unspecified: Secondary | ICD-10-CM | POA: Diagnosis not present

## 2019-10-22 DIAGNOSIS — G4733 Obstructive sleep apnea (adult) (pediatric): Secondary | ICD-10-CM | POA: Insufficient documentation

## 2019-10-22 DIAGNOSIS — R197 Diarrhea, unspecified: Secondary | ICD-10-CM | POA: Diagnosis not present

## 2019-10-22 DIAGNOSIS — Z8673 Personal history of transient ischemic attack (TIA), and cerebral infarction without residual deficits: Secondary | ICD-10-CM | POA: Diagnosis not present

## 2019-10-22 DIAGNOSIS — R5383 Other fatigue: Secondary | ICD-10-CM | POA: Diagnosis not present

## 2019-10-22 LAB — CBC WITH DIFFERENTIAL/PLATELET
Abs Immature Granulocytes: 0 10*3/uL (ref 0.00–0.07)
Basophils Absolute: 0 10*3/uL (ref 0.0–0.1)
Basophils Relative: 1 %
Eosinophils Absolute: 0.3 10*3/uL (ref 0.0–0.5)
Eosinophils Relative: 6 %
HCT: 38 % — ABNORMAL LOW (ref 39.0–52.0)
Hemoglobin: 11.6 g/dL — ABNORMAL LOW (ref 13.0–17.0)
Immature Granulocytes: 0 %
Lymphocytes Relative: 26 %
Lymphs Abs: 1.3 10*3/uL (ref 0.7–4.0)
MCH: 28.5 pg (ref 26.0–34.0)
MCHC: 30.5 g/dL (ref 30.0–36.0)
MCV: 93.4 fL (ref 80.0–100.0)
Monocytes Absolute: 0.5 10*3/uL (ref 0.1–1.0)
Monocytes Relative: 10 %
Neutro Abs: 3 10*3/uL (ref 1.7–7.7)
Neutrophils Relative %: 57 %
Platelets: 184 10*3/uL (ref 150–400)
RBC: 4.07 MIL/uL — ABNORMAL LOW (ref 4.22–5.81)
RDW: 12.9 % (ref 11.5–15.5)
WBC: 5.1 10*3/uL (ref 4.0–10.5)
nRBC: 0 % (ref 0.0–0.2)

## 2019-10-22 LAB — COMPREHENSIVE METABOLIC PANEL
ALT: 22 U/L (ref 0–44)
AST: 25 U/L (ref 15–41)
Albumin: 3.9 g/dL (ref 3.5–5.0)
Alkaline Phosphatase: 54 U/L (ref 38–126)
Anion gap: 8 (ref 5–15)
BUN: 31 mg/dL — ABNORMAL HIGH (ref 8–23)
CO2: 26 mmol/L (ref 22–32)
Calcium: 9.1 mg/dL (ref 8.9–10.3)
Chloride: 105 mmol/L (ref 98–111)
Creatinine, Ser: 1.71 mg/dL — ABNORMAL HIGH (ref 0.61–1.24)
GFR calc Af Amer: 45 mL/min — ABNORMAL LOW (ref >60)
GFR calc non Af Amer: 39 mL/min — ABNORMAL LOW (ref >60)
Glucose, Bld: 216 mg/dL — ABNORMAL HIGH (ref 70–99)
Potassium: 4.2 mmol/L (ref 3.5–5.1)
Sodium: 139 mmol/L (ref 135–145)
Total Bilirubin: 0.7 mg/dL (ref 0.3–1.2)
Total Protein: 7 g/dL (ref 6.5–8.1)

## 2019-10-22 LAB — LACTATE DEHYDROGENASE: LDH: 180 U/L (ref 98–192)

## 2019-10-22 LAB — IRON AND TIBC
Iron: 60 ug/dL (ref 45–182)
Saturation Ratios: 17 % — ABNORMAL LOW (ref 17.9–39.5)
TIBC: 355 ug/dL (ref 250–450)
UIBC: 295 ug/dL

## 2019-10-22 LAB — FERRITIN: Ferritin: 93 ng/mL (ref 24–336)

## 2019-10-22 LAB — VITAMIN B12: Vitamin B-12: 489 pg/mL (ref 180–914)

## 2019-10-22 NOTE — Progress Notes (Unsigned)
Referral sent to [facility] for increased crea labs.   Records faxed on 3/19

## 2019-10-22 NOTE — Patient Instructions (Signed)
[facility] Discharge Instructions  Follow up in 4 months with labs    Thank you for choosing [facility] at [facility] to provide your oncology and hematology care.  To afford each patient quality time with our provider, please arrive at least 15 minutes before your scheduled appointment time.   If you have a lab appointment with the Cancer Center please come in thru the Main Entrance and check in at the main information desk.  You need to re-schedule your appointment should you arrive 10 or more minutes late.  We strive to give you quality time with our providers, and arriving late affects you and other patients whose appointments are after yours.  Also, if you no show three or more times for appointments you may be dismissed from the clinic at the providers discretion.     Again, thank you for choosing [facility].  Our hope is that these requests will decrease the amount of time that you wait before being seen by our physicians.       _____________________________________________________________  Should you have questions after your visit to  Cancer Center, please contact our office at [phone] between the hours of 8:00 a.m. and 4:30 p.m.  Voicemails left after 4:00 p.m. will not be returned until the following business day.  For prescription refill requests, have your pharmacy contact our office and allow 72 hours.    Due to Covid, you will need to wear a mask upon entering the hospital. If you do not have a mask, a mask will be given to you at the Main Entrance upon arrival. For doctor visits, patients may have 1 support person with them. For treatment visits, patients can not have anyone with them due to social distancing guidelines and our immunocompromised population.      

## 2019-10-22 NOTE — Progress Notes (Signed)
[place], [place] [address]   CLINIC:  Medical Oncology/Hematology  PCP:  [name], MD [address] [place] [address] [phone]   REASON FOR VISIT: Follow-up for iron deficiency anemia  CURRENT THERAPY: Intermittent IV iron   INTERVAL HISTORY:  [name] 74 y.o. male returns for routine follow-up for iron deficiency anemia.  Patient reports increased diarrhea 2-3 times a week.  Patient reports increased fatigue as well.  He denies any bright red bleeding per rectum or melena. Denies any nausea or vomiting. Denies any new pains. Had not noticed any recent bleeding such as epistaxis, hematuria or hematochezia. Denies recent chest pain on exertion, shortness of breath on minimal exertion, pre-syncopal episodes, or palpitations. Denies any numbness or tingling in hands or feet. Denies any recent fevers, infections, or recent hospitalizations. Patient reports appetite at 100% and energy level at 50%.  Maintained his weight at this time.   REVIEW OF SYSTEMS:  Review of Systems  Respiratory: Positive for shortness of breath (With exertion).   Gastrointestinal: Positive for diarrhea.  All other systems reviewed and are negative.    PAST MEDICAL/SURGICAL HISTORY:  Past Medical History:  Diagnosis Date  . Bronchitis 11/2012  . Cardiomyopathy    LVEF 25%  . Diabetes mellitus   . Erectile dysfunction   . Essential hypertension   . GERD (gastroesophageal reflux disease)   . History of colonic polyps   . Mixed hyperlipidemia   . 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 Obstructive sleep apnea   . Patent foramen ovale   . Phlebitis   . 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 Superficial phlebitis 12/10/2012  . Type 2 diabetes mellitus ([place])   . Vitamin D deficiency    No past surgical history on file.   SOCIAL HISTORY:  Social History   Socioeconomic History  . Marital status: Married    Spouse name: Not on  file  . Number of children: Not on file  . Years of education: Not on file  . Highest education level: Not on file  Occupational History  . Occupation: Truck Education administrator: RETIRED  Tobacco Use  . Smoking status: Former Smoker    Types: Cigarettes    Start date: 08/06/1967    Quit date: 08/06/2003    Years since quitting: 16.2  . Smokeless tobacco: Never Used  Substance and Sexual Activity  . Alcohol use: No    Alcohol/week: 0.0 standard drinks  . Drug use: No  . Sexual activity: Not Currently  Other Topics Concern  . Not on file  Social History Narrative  . Not on file   Social Determinants of Health   Financial Resource Strain:   . Difficulty of Paying Living Expenses:   Food Insecurity:   . Worried About Charity fundraiser in the Last Year:   . Arboriculturist in the Last Year:   Transportation Needs:   . Film/video editor (Medical):   [name] Lack of Transportation (Non-Medical):   Physical Activity:   . Days of Exercise per Week:   . Minutes of Exercise per Session:   Stress:   . Feeling of Stress :   Social Connections:   . Frequency of Communication with Friends and Family:   . Frequency of Social Gatherings with Friends and Family:   . Attends Religious Services:   . Active Member of  Clubs or Organizations:   . Attends Archivist Meetings:   [name] Marital Status:   Intimate Partner Violence:   . Fear of Current or Ex-Partner:   . Emotionally Abused:   [name] Physically Abused:   . Sexually Abused:     FAMILY HISTORY:  Family History  Problem Relation Age of Onset  . Diabetes Father   . Hypertension Other     CURRENT MEDICATIONS:  Outpatient Encounter Medications as of 10/22/2019  Medication Sig  . aspirin 81 MG tablet Take 81 mg by mouth daily.    . carvedilol (COREG) 12.5 MG tablet TAKE 1 TABLET TWICE A DAY  WITH MEALS  . furosemide (LASIX) 40 MG tablet Take 40 mg by mouth daily.  [name] gabapentin (NEURONTIN) 300 MG capsule Take 3 capsules (900  mg total) by mouth daily.  [name] glipiZIDE (GLUCOTROL) 5 MG tablet Take 5 mg by mouth daily.   . NON FORMULARY legatrin pm Daily    . olmesartan (BENICAR) 20 MG tablet Take 1 tablet (20 mg total) by mouth daily.  . Omega-3 Fatty Acids (FISH OIL) 1000 MG CAPS Take by mouth.  [name] PROAIR HFA 108 (90 BASE) MCG/ACT inhaler Inhale 2 puffs into the lungs 4 (four) times daily.   . simvastatin (ZOCOR) 40 MG tablet Take 40 mg by mouth daily at 6 PM.   . spironolactone (ALDACTONE) 25 MG tablet Take 0.5 tablets (12.5 mg total) by mouth daily.  [name] warfarin (JANTOVEN) 5 MG tablet TAKE 1 TABLET DAILY EXCEPT 2 TABLETS ON MONDAYS AND   FRIDAYS   No facility-administered encounter medications on file as of 10/22/2019.    ALLERGIES:  No Known Allergies   PHYSICAL EXAM:  ECOG Performance status: 1  Vitals:   10/22/19 1046  BP: 108/61  Pulse: 60  Resp: 14  Temp: (!) 97.1 F (36.2 C)  SpO2: 97%   Filed Weights   10/22/19 1046  Weight: 203 lb 12.8 oz (92.4 kg)    Physical Exam Constitutional:      Appearance: Normal appearance. He is normal weight.  Cardiovascular:     Rate and Rhythm: Normal rate and regular rhythm.     Heart sounds: Normal heart sounds.  Pulmonary:     Effort: Pulmonary effort is normal.     Breath sounds: Normal breath sounds.  Abdominal:     General: Bowel sounds are normal.     Palpations: Abdomen is soft.  Musculoskeletal:        General: Normal range of motion.  Skin:    General: Skin is warm.  Neurological:     Mental Status: He is alert and oriented to person, place, and time. Mental status is at baseline.  Psychiatric:        Mood and Affect: Mood normal.        Behavior: Behavior normal.        Thought Content: Thought content normal.        Judgment: Judgment normal.      LABORATORY DATA:  I have reviewed the labs as listed.  CBC    Component Value Date/Time   WBC 5.1 10/22/2019 1027   RBC 4.07 (L) 10/22/2019 1027   HGB 11.6 (L) 10/22/2019 1027    HCT 38.0 (L) 10/22/2019 1027   PLT 184 10/22/2019 1027   MCV 93.4 10/22/2019 1027   MCH 28.5 10/22/2019 1027   MCHC 30.5 10/22/2019 1027   RDW 12.9 10/22/2019 1027   LYMPHSABS 1.3 10/22/2019 1027   MONOABS  0.5 10/22/2019 1027   EOSABS 0.3 10/22/2019 1027   BASOSABS 0.0 10/22/2019 1027   CMP Latest Ref Rng & Units 10/22/2019 06/21/2019 04/23/2019  Glucose 70 - 99 mg/dL 216(H) 126(H) 105(H)  BUN 8 - 23 mg/dL 31(H) 21 25(H)  Creatinine 0.61 - 1.24 mg/dL 1.71(H) 1.34(H) 1.38(H)  Sodium 135 - 145 mmol/L 139 140 138  Potassium 3.5 - 5.1 mmol/L 4.2 4.6 4.2  Chloride 98 - 111 mmol/L 105 108 106  CO2 22 - 32 mmol/L 26 23 25   Calcium 8.9 - 10.3 mg/dL 9.1 9.0 9.0  Total Protein 6.5 - 8.1 g/dL 7.0 - 7.4  Total Bilirubin 0.3 - 1.2 mg/dL 0.7 - 0.4  Alkaline Phos 38 - 126 U/L 54 - 48  AST 15 - 41 U/L 25 - 27  ALT 0 - 44 U/L 22 - 22     I personally performed a face-to-face visit,  All questions were answered to patient's stated satisfaction. Encouraged patient to call with any new concerns or questions before his next visit to the cancer center and we can certain see him sooner, if needed.     ASSESSMENT & PLAN:   Iron deficiency anemia 1.  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 -He reports increased fatigue over the past few months. - He has never tried oral iron in the past. - We have placed him on 1 tab daily. -He reports he only took the iron tablets for a few weeks due to constipation and abdominal pain. -Labs on 10/22/2019 showed his hemoglobin 11.6, ferritin 93 , percent saturation 17. -Patient reports his energy levels are decreased.  He does not want to try any IV iron at this time he wants to try his iron tablets again.  He will also add a stool softener with them for the constipation. - We will follow-up in 4 months with repeat labs.  2.  Chronic kidney disease: -Labs done on 10/22/2019 showed his creatinine 1.71 which is increased from his  baseline. -Patient reports he does not have a nephrologist. -We will refer him to nephrology.  3.  Diarrhea: -Patient reports he is having diarrhea multiple times a week.  He denies blood in his stool. -Patient reports he has not had a colonoscopy in over 10 years. -We will refer him back to his gastric doctor for checkup and colonoscopy.      Orders placed this encounter:  Orders Placed This Encounter  Procedures  . Lactate dehydrogenase  . CBC with Differential/Platelet  . Comprehensive metabolic panel  . Ferritin  . Iron and TIBC  . Vitamin B12  . VITAMIN D 25 Hydroxy (Vit-D Deficiency, Fractures)      Francene Finders, FNP-C [place] [phone]

## 2019-10-22 NOTE — Assessment & Plan Note (Addendum)
1.  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 -He reports increased fatigue over the past few months. - He has never tried oral iron in the past. - We have placed him on 1 tab daily. -He reports he only took the iron tablets for a few weeks due to constipation and abdominal pain. -Labs on 10/22/2019 showed his hemoglobin 11.6, ferritin 93 , percent saturation 17. -Patient reports his energy levels are decreased.  He does not want to try any IV iron at this time he wants to try his iron tablets again.  He will also add a stool softener with them for the constipation. - We will follow-up in 4 months with repeat labs.  2.  Chronic kidney disease: -Labs done on 10/22/2019 showed his creatinine 1.71 which is increased from his baseline. -Patient reports he does not have a nephrologist. -We will refer him to nephrology.  3.  Diarrhea: -Patient reports he is having diarrhea multiple times a week.  He denies blood in his stool. -Patient reports he has not had a colonoscopy in over 10 years. -We will refer him back to his gastric doctor for checkup and colonoscopy.

## 2019-10-25 ENCOUNTER — Ambulatory Visit: Payer: Medicare Other | Admitting: Cardiology

## 2019-10-25 ENCOUNTER — Encounter: Payer: Self-pay | Admitting: Internal Medicine

## 2019-10-25 ENCOUNTER — Other Ambulatory Visit: Payer: Self-pay

## 2019-10-25 NOTE — Progress Notes (Deleted)
Cardiology Office Note  Date: 10/25/2019   ID: Roger, Zarr 1945-11-11, MRN [id number]  PCP:  [name], MD  Cardiologist:  Rozann Lesches, MD Electrophysiologist:  None   No chief complaint on file.   History of Present Illness: Dennis Yu is a 74 y.o. male last seen in December 2020.  He continues on Coumadin with follow-up in the anticoagulation clinic.  Past Medical History:  Diagnosis Date  . Bronchitis 11/2012  . Cardiomyopathy    LVEF 25%  . Diabetes mellitus   . Erectile dysfunction   . Essential hypertension   . GERD (gastroesophageal reflux disease)   . History of colonic polyps   . Mixed hyperlipidemia   . 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 Obstructive sleep apnea   . Patent foramen ovale   . Phlebitis   . 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 Superficial phlebitis 12/10/2012  . Type 2 diabetes mellitus ([place])   . Vitamin D deficiency     No past surgical history on file.  Current Outpatient Medications  Medication Sig Dispense Refill  . aspirin 81 MG tablet Take 81 mg by mouth daily.      . carvedilol (COREG) 12.5 MG tablet TAKE 1 TABLET TWICE A DAY  WITH MEALS 180 tablet 1  . furosemide (LASIX) 40 MG tablet Take 40 mg by mouth daily.    [name] gabapentin (NEURONTIN) 300 MG capsule Take 3 capsules (900 mg total) by mouth daily. 90 capsule 11  . glipiZIDE (GLUCOTROL) 5 MG tablet Take 5 mg by mouth daily.     . NON FORMULARY legatrin pm Daily      . olmesartan (BENICAR) 20 MG tablet Take 1 tablet (20 mg total) by mouth daily. 90 tablet 3  . Omega-3 Fatty Acids (FISH OIL) 1000 MG CAPS Take by mouth.    [name] PROAIR HFA 108 (90 BASE) MCG/ACT inhaler Inhale 2 puffs into the lungs 4 (four) times daily.     . simvastatin (ZOCOR) 40 MG tablet Take 40 mg by mouth daily at 6 PM.     . spironolactone (ALDACTONE) 25 MG tablet Take 0.5 tablets (12.5 mg total) by mouth daily. 45 tablet 3  . warfarin (JANTOVEN) 5 MG tablet TAKE 1  TABLET DAILY EXCEPT 2 TABLETS ON MONDAYS AND   FRIDAYS 120 tablet 1   No current facility-administered medications for this visit.   Allergies:  Patient has no known allergies.   Social History: The patient  reports that he quit smoking about 16 years ago. His smoking use included cigarettes. He started smoking about 52 years ago. He has never used smokeless tobacco. He reports that he does not drink alcohol or use drugs.   Family History: The patient's family history includes Diabetes in his father; Hypertension in an other family member.   ROS:  Please see the history of present illness. Otherwise, complete review of systems is positive for {NONE DEFAULTED:18576::"none"}.  All other systems are reviewed and negative.   Physical Exam: VS:  There were no vitals taken for this visit., BMI There is no height or weight on file to calculate BMI.  Wt Readings from Last 3 Encounters:  10/22/19 203 lb 12.8 oz (92.4 kg)  07/23/19 205 lb (93 kg)  06/18/19 197 lb (89.4 kg)    General: Patient appears comfortable at rest. HEENT: Conjunctiva and lids normal, oropharynx clear with moist mucosa. Neck: Supple, no elevated JVP  or carotid bruits, no thyromegaly. Lungs: Clear to auscultation, nonlabored breathing at rest. Cardiac: Regular rate and rhythm, no S3 or significant systolic murmur, no pericardial rub. Abdomen: Soft, nontender, no hepatomegaly, bowel sounds present, no guarding or rebound. Extremities: No pitting edema, distal pulses 2+. Skin: Warm and dry. Musculoskeletal: No kyphosis. Neuropsychiatric: Alert and oriented x3, affect grossly appropriate.  ECG:  An ECG dated 04/29/2018 was personally reviewed today and demonstrated:  Sinus rhythm with old anterolateral/inferior infarct pattern with repolarization abnormalities.  Recent Labwork: 10/22/2019: ALT 22; AST 25; BUN 31; Creatinine, Ser 1.71; Hemoglobin 11.6; Platelets 184; Potassium 4.2; Sodium 139   Other Studies Reviewed Today:   Echocardiogram 04/30/2019: 1. Left ventricular ejection fraction, by visual estimation, is 25%. The left ventricle has severely decreased function. Moderately increased left ventricular size. There is mildly increased left ventricular hypertrophy. 2. Multiple segmental abnormalities exist. See findings. 3. Definity contrast agent was given IV to delineate the left ventricular endocardial borders. 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 Inferior apical calcification consistent with old thrombus. 4. Left ventricular diastolic Doppler parameters are consistent with pseudonormalization pattern of LV diastolic filling. 5. Global right ventricle has normal systolic function.The right ventricular size is normal. No increase in right ventricular wall thickness. 6. Left atrial size was severely dilated. 7. Right atrial size was normal. 8. Mild to moderate aortic valve annular calcification. 9. The mitral valve is grossly normal. Mild mitral valve regurgitation. 10. The tricuspid valve is grossly normal. Tricuspid valve regurgitation is trivial. 11. The aortic valve is tricuspid Aortic valve regurgitation was not visualized by color flow Doppler. 12. The pulmonic valve was grossly normal. Pulmonic valve regurgitation is trivial by color flow Doppler. 13. Normal pulmonary artery systolic pressure. 14. The inferior vena cava is normal in size with greater than 50% respiratory variability, suggesting right atrial pressure of 3 mmHg. 15. The tricuspid regurgitant velocity is 2.33 m/s, and with an assumed right atrial pressure of 3 mmHg, the estimated right ventricular systolic pressure is normal at 24.7 mmHg.  Assessment and Plan:   Medication Adjustments/Labs and Tests Ordered: Current medicines are reviewed at length with the patient today.  Concerns regarding medicines are outlined above.   Tests Ordered: No orders of the defined types were placed  in this encounter.   Medication Changes: No orders of the defined types were placed in this encounter.   Disposition:  Follow up {follow up:15908}  Signed, Satira Sark, MD, [facility] 10/25/2019 8:05 AM    [facility] [place]. [address] [place], [place] [address] Phone: [phone]; Fax: [phone]

## 2019-10-28 ENCOUNTER — Encounter: Payer: Self-pay | Admitting: Cardiology

## 2019-11-10 ENCOUNTER — Other Ambulatory Visit: Payer: Self-pay

## 2019-11-10 ENCOUNTER — Ambulatory Visit (INDEPENDENT_AMBULATORY_CARE_PROVIDER_SITE_OTHER): Payer: Medicare Other | Admitting: *Deleted

## 2019-11-10 DIAGNOSIS — I6349 Cerebral infarction due to embolism of other cerebral artery: Secondary | ICD-10-CM

## 2019-11-10 DIAGNOSIS — Z7901 Long term (current) use of anticoagulants: Secondary | ICD-10-CM | POA: Diagnosis not present

## 2019-11-10 DIAGNOSIS — Z5181 Encounter for therapeutic drug level monitoring: Secondary | ICD-10-CM | POA: Diagnosis not present

## 2019-11-10 DIAGNOSIS — I639 Cerebral infarction, unspecified: Secondary | ICD-10-CM | POA: Diagnosis not present

## 2019-11-10 DIAGNOSIS — I6350 Cerebral infarction due to unspecified occlusion or stenosis of unspecified cerebral artery: Secondary | ICD-10-CM

## 2019-11-10 DIAGNOSIS — Z86718 Personal history of other venous thrombosis and embolism: Secondary | ICD-10-CM | POA: Diagnosis not present

## 2019-11-10 LAB — POCT INR: INR: 2.2 (ref 2.0–3.0)

## 2019-11-10 NOTE — Patient Instructions (Signed)
Continue warfarin 1 tablet daily except 2 tablets on Fridays.  Recheck in 6 weeks.

## 2019-11-14 ENCOUNTER — Other Ambulatory Visit: Payer: Self-pay | Admitting: Cardiology

## 2019-11-17 ENCOUNTER — Other Ambulatory Visit: Payer: Self-pay

## 2019-11-17 ENCOUNTER — Ambulatory Visit (INDEPENDENT_AMBULATORY_CARE_PROVIDER_SITE_OTHER): Payer: Medicare Other | Admitting: Nurse Practitioner

## 2019-11-17 ENCOUNTER — Encounter: Payer: Self-pay | Admitting: Internal Medicine

## 2019-11-17 ENCOUNTER — Encounter: Payer: Self-pay | Admitting: Nurse Practitioner

## 2019-11-17 ENCOUNTER — Telehealth: Payer: Self-pay

## 2019-11-17 VITALS — BP 100/64 | HR 63 | Temp 97.1°F | Ht 72.0 in | Wt 201.4 lb

## 2019-11-17 DIAGNOSIS — I6350 Cerebral infarction due to unspecified occlusion or stenosis of unspecified cerebral artery: Secondary | ICD-10-CM

## 2019-11-17 DIAGNOSIS — R197 Diarrhea, unspecified: Secondary | ICD-10-CM

## 2019-11-17 DIAGNOSIS — Z86010 Personal history of colonic polyps: Secondary | ICD-10-CM | POA: Insufficient documentation

## 2019-11-17 DIAGNOSIS — D509 Iron deficiency anemia, unspecified: Secondary | ICD-10-CM | POA: Diagnosis not present

## 2019-11-17 NOTE — Telephone Encounter (Signed)
Let me know when colonoscopy is scheduled and I will bridge him with Lovenox.  Need 1 week notice at least. Thanks, Lattie Haw

## 2019-11-17 NOTE — Telephone Encounter (Signed)
Dennis Yu, pt is scheduled for Colonoscopy 01/11/20.

## 2019-11-17 NOTE — Telephone Encounter (Signed)
Noted. Routing to EG and MB

## 2019-11-17 NOTE — Assessment & Plan Note (Signed)
History of tubulovillous adenoma colon polyps in 2012 with recommended 3-year repeat (2015).  Does not appear this is been completed.  The patient does have iron deficiency anemia at this time followed by hematology.  Also with stool changes and diarrhea which started about 6 months ago.  See diarrhea management below.  At this point we will proceed with colonoscopy for diagnostic element related to diarrhea, as he would probably benefit from random colon biopsies.  This will also allow [place] to accomplish surveillance for colon polyps.  Return for follow-up in 4 months.  Proceed with TCS on propofol/MAC with Dr. Gala Romney in near future: the risks, benefits, and alternatives have been discussed with the patient in detail. The patient states understanding and desires to proceed.  ASA III due to CHF (EF 25%)  The patient is currently on Neurontin, glipizide, warfarin. The patient is not on any other anticoagulants, anxiolytics, chronic pain medications, antidepressants, antidiabetics, or iron supplements.  We will contact cardiology for okay to hold warfarin for 5 days prior to procedure and discuss any possible need for Lovenox bridge.  Adjustments will be made to his glipizide.  We will plan for propofol/MAC due to health history and Neurontin to promote adequate sedation.

## 2019-11-17 NOTE — Assessment & Plan Note (Signed)
Iron deficiency anemia with some stabilization and ferritin and mild decline in hemoglobin.  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  This is somewhat concerning given his anemia and change in stools (diarrhea as per below).  Recommend colonoscopy as soon as schedule allows for surveillance colonoscopy and diagnostic related to stool studies.  Denies GI bleed.  Anemia being managed by hematology.  If no findings to explain IDA on colonoscopy we could consider doing an upper endoscopy in the future.

## 2019-11-17 NOTE — Telephone Encounter (Signed)
Dr. Domenic Polite, pt is due for a colonoscopy with Dr. Gala Romney. Is it ok for pt to hold Coumadin x 5 days prior to procedure? Is Lovenox bridge needed? Please advise.

## 2019-11-17 NOTE — Patient Instructions (Signed)
Your health issues we discussed today were:   Diarrhea: 1. Complete your stool studies as soon as you can 2. Your stools must be diarrhea/loose stools to do the stool studies so they will not run the test 3. After you complete your stool studies, try taking Imodium in the morning to prevent diarrhea 4. Call us in 1 week after starting Imodium and let us know if you are having any improvement 5. Call us if you have any worsening or severe diarrhea  Anemia and history of colon polyps: 1. Continue follow-up with oncology based on the recommendations 2. We will proceed with colonoscopy at this time 3. Further recommendations will be made based on results your colonoscopy 4. Call us if you have any obvious bleeding in your stools  Overall I recommend:  1. Continue your other current medications 2. Return for follow-up in 4 months 3. Call us for any questions or concerns   At [facility] we value your feedback. You may receive a survey about your visit today. Please share your experience as we strive to create trusting relationships with our patients to provide genuine, compassionate, quality care.  We appreciate your understanding and patience as we review any laboratory studies, imaging, and other diagnostic tests that are ordered as we care for you. Our office policy is 5 business days for review of these results, and any emergent or urgent results are addressed in a timely manner for your best interest. If you do not hear from our office in 1 week, please contact us.   We also encourage the use of MyChart, which contains your medical information for your review as well. If you are not enrolled in this feature, an access code is on this after visit summary for your convenience. Thank you for allowing [place] to be involved in your care.  It was great to see you today!  I hope you have a great Summer!!

## 2019-11-17 NOTE — Progress Notes (Signed)
Primary Care Physician:  [name], MD Primary Gastroenterologist:  Dr. Gala Romney  Chief Complaint  Patient presents with  . Consult    TCS done about 7 years or more ago  . Diarrhea    3-4 times every morning, no blood in stool    HPI:   Dennis Yu is a 74 y.o. male who presents on referral from hematology/oncology for colonoscopy.  Reviewed information associated with referral including office note dated 10/22/2019 for IDA.  At that time noted diarrhea 2-3 times a week and increased fatigue, no hematochezia or melena.  No other recent bleeding.  No numbness or tingling in the hands or feet.  Weight stable.  The patient is on chronic anticoagulation with warfarin.  Does not appear to be on oral iron at this time.  Overall IDA felt likely due to chronic kidney disease.  Started him on oral iron at that time, although previously had constipation with this.  Hemoglobin on 10/22/2019 at 11.6, ferritin 93, 70% saturation.  Declined IV iron at that time.  Recommended follow-up in 4 months.  Because of multiple diarrhea episodes a week recommended referral to GI for consideration of colonoscopy given no colonoscopy in about 10 years.  Reviewed lab trends and over the past 10 months ferritin has been trending up from 48 -> 93.  Iron has been essentially stable, saturations mild trend downward from 19 -> 13 -> 17.  Mild decline in hemoglobin/CBC as per below.  CBC Latest Ref Rng & Units 10/22/2019 04/23/2019 01/15/2019  WBC 4.0 - 10.5 K/uL 5.1 8.3 6.8  Hemoglobin 13.0 - 17.0 g/dL 11.6(L) 12.2(L) 12.1(L)  Hematocrit 39.0 - 52.0 % 38.0(L) 40.3 39.1  Platelets 150 - 400 K/uL 184 196 197   Last colonoscopy completed 10/23/2010 which found normal rectum, left-sided diverticula, large polyps in the left colon status post resection.  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  Recommended repeat colonoscopy in 3 years (2015).  No further colonoscopy reports found in our  system.  Today he states he's doing ok overall. On iron daily (just started). Having diarrhea "almost every day". Typically has 3-4 stools a day on days that he has diarrhea. Stools are loose, not "straight water." Denies hematochezia, melena; Dark stools now that he's on iron. Energy level is pretty good most of the time. Lately seems to be falling off, not sure if it's due to the diarrhea. Drinking plenty of fluids. Appetite is good. Not on Metformin. Denies sick contacts, recent travel, new/adventureous foods. His diarrhea has been going on for 6 months. No stool studies have been done. Denies abdominal pain, N/V, fever, chills, unintentional weight loss. He doesn't check his CBGs at home, seems to be doing well at PCP visits. Denies URI or flu-like symptoms. Denies loss of sense of taste or smell. Has had COVID-19 vaccinations. Denies chest pain, dyspnea, dizziness, lightheadedness, syncope, near syncope. Denies any other upper or lower GI symptoms.  History of ischemic cardiomyopathy and last saw cardiology 07/23/2019.  At that time noted Coumadin dosing followed by anticoagulation clinic with last INR 2.4.  Noted ischemic cardiomyopathy with LVEF approximately 25% not able to stay on Entresto due to cost and is now on Benicar with addition of Aldactone.  Also on Coreg and Lasix.  Prefers conservative management without invasive testing.  Recommended continue current medications and follow-up in 3 months.  Is on Coumadin for history of CVA.   Past Medical History:  Diagnosis Date  .  Bronchitis 11/2012  . Cardiomyopathy    LVEF 25%  . Diabetes mellitus   . Erectile dysfunction   . Essential hypertension   . GERD (gastroesophageal reflux disease)   . History of colonic polyps   . Mixed hyperlipidemia   . 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 Obstructive sleep apnea   . Patent foramen ovale   . Phlebitis   . 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 Superficial phlebitis  12/10/2012  . Type 2 diabetes mellitus ([place])   . Vitamin D deficiency     Past Surgical History:  Procedure Laterality Date  . NO PAST SURGERIES      Current Outpatient Medications  Medication Sig Dispense Refill  . aspirin 81 MG tablet Take 81 mg by mouth daily.      . carvedilol (COREG) 12.5 MG tablet TAKE 1 TABLET TWICE A DAY  WITH MEALS 180 tablet 1  . furosemide (LASIX) 40 MG tablet Take 40 mg by mouth daily.    [name] gabapentin (NEURONTIN) 300 MG capsule Take 3 capsules (900 mg total) by mouth daily. 90 capsule 11  . glipiZIDE (GLUCOTROL) 5 MG tablet Take 5 mg by mouth daily.     . NON FORMULARY legatrin pm Daily      . olmesartan (BENICAR) 20 MG tablet Take 1 tablet (20 mg total) by mouth daily. 90 tablet 3  . Omega-3 Fatty Acids (FISH OIL) 1000 MG CAPS Take by mouth.    [name] PROAIR HFA 108 (90 BASE) MCG/ACT inhaler Inhale 2 puffs into the lungs 4 (four) times daily.     . simvastatin (ZOCOR) 40 MG tablet Take 40 mg by mouth daily at 6 PM.     . spironolactone (ALDACTONE) 25 MG tablet Take 0.5 tablets (12.5 mg total) by mouth daily. 45 tablet 3  . warfarin (JANTOVEN) 5 MG tablet TAKE 1 TABLET DAILY EXCEPT 2 TABLETS ON  FRIDAYS 100 tablet 3   No current facility-administered medications for this visit.    Allergies as of 11/17/2019  . (No Known Allergies)    Family History  Problem Relation Age of Onset  . Diabetes Father   . Hypertension Other   . Colon cancer Neg Hx     Social History   Socioeconomic History  . Marital status: Married    Spouse name: Not on file  . Number of children: Not on file  . Years of education: Not on file  . Highest education level: Not on file  Occupational History  . Occupation: Truck Education administrator: RETIRED  Tobacco Use  . Smoking status: Former Smoker    Types: Cigarettes    Start date: 08/06/1967    Quit date: 08/06/2003    Years since quitting: 16.2  . Smokeless tobacco: Never Used  Substance and Sexual Activity  . Alcohol  use: No    Alcohol/week: 0.0 standard drinks  . Drug use: No  . Sexual activity: Not Currently  Other Topics Concern  . Not on file  Social History Narrative  . Not on file   Social Determinants of Health   Financial Resource Strain:   . Difficulty of Paying Living Expenses:   Food Insecurity:   . Worried About Charity fundraiser in the Last Year:   . Arboriculturist in the Last Year:   Transportation Needs:   . Film/video editor (Medical):   [name] Lack of Transportation (Non-Medical):  Physical Activity:   . Days of Exercise per Week:   . Minutes of Exercise per Session:   Stress:   . Feeling of Stress :   Social Connections:   . Frequency of Communication with Friends and Family:   . Frequency of Social Gatherings with Friends and Family:   . Attends Religious Services:   . Active Member of Clubs or Organizations:   . Attends Archivist Meetings:   [name] Marital Status:   Intimate Partner Violence:   . Fear of Current or Ex-Partner:   . Emotionally Abused:   [name] Physically Abused:   . Sexually Abused:     Subjective: Review of Systems  Constitutional: Negative for chills, fever, malaise/fatigue and weight loss.  HENT: Negative for congestion and sore throat.   Respiratory: Negative for cough and shortness of breath.   Cardiovascular: Negative for chest pain and palpitations.  Gastrointestinal: Positive for diarrhea. Negative for abdominal pain, blood in stool, melena, nausea and vomiting.  Musculoskeletal: Negative for joint pain and myalgias.  Skin: Negative for rash.  Neurological: Negative for dizziness and weakness.  Endo/Heme/Allergies: Does not bruise/bleed easily.  Psychiatric/Behavioral: Negative for depression. The patient is not nervous/anxious.   All other systems reviewed and are negative.      Objective: BP 100/64   Pulse 63   Temp (!) 97.1 F (36.2 C) (Oral)   Ht 6' (1.829 m)   Wt 201 lb 6.4 oz (91.4 kg)   BMI 27.31 kg/m  Physical  Exam Vitals and nursing note reviewed.  Constitutional:      General: He is not in acute distress.    Appearance: Normal appearance. He is normal weight. He is not ill-appearing, toxic-appearing or diaphoretic.  HENT:     Head: Normocephalic and atraumatic.     Nose: No congestion or rhinorrhea.  Eyes:     General: No scleral icterus. Cardiovascular:     Rate and Rhythm: Normal rate and regular rhythm.     Heart sounds: Normal heart sounds.  Pulmonary:     Effort: Pulmonary effort is normal.     Breath sounds: Normal breath sounds.  Abdominal:     General: Bowel sounds are normal. There is no distension.     Palpations: Abdomen is soft. There is no hepatomegaly, splenomegaly or mass.     Tenderness: There is no abdominal tenderness. There is no guarding or rebound.     Hernia: No hernia is present.  Musculoskeletal:     Cervical back: Neck supple.     Comments: Uses a walker  Skin:    General: Skin is warm and dry.     Coloration: Skin is not jaundiced.     Findings: No bruising or rash.  Neurological:     General: No focal deficit present.     Mental Status: He is alert and oriented to person, place, and time. Mental status is at baseline.  Psychiatric:        Mood and Affect: Mood normal.        Behavior: Behavior normal.        Thought Content: Thought content normal.       11/17/2019 10:54 AM   Disclaimer: This note was dictated with voice recognition software. Similar sounding words can inadvertently be transcribed and may not be corrected upon review.

## 2019-11-17 NOTE — Telephone Encounter (Signed)
Yes, he likely needs Lovenox bridge with cardiomyopathy and prior history of stroke. Will forward to our anticoagulation clinic to coordinate this.

## 2019-11-17 NOTE — Telephone Encounter (Signed)
Noted. FYI EG & MB

## 2019-11-17 NOTE — Telephone Encounter (Signed)
Called pt, TCS w/Propofol w/RMR scheduled for 01/11/20 at 10:15am. Pt is aware the coumadin clinic will be handling holding Coumadin and Lovenox. Orders entered.

## 2019-11-17 NOTE — Assessment & Plan Note (Signed)
The patient describes diarrhea for the past 6 months.  Was previously on Metformin but is now on glipizide with no improvement.  Was previously recommended to be on Entresto, but could not afford it and so he is not currently on this.  Possible rare med effect.  Denies sick contacts, recent travel, sources of food poisoning.  Stools are not every day, but about every other day.  Typically has 3-4 stools, mostly in the morning, on days that he does have diarrhea.  No stool studies previously checked and I will check these for completeness today.  If these are negative he can try Imodium for symptomatic management.  Recommend taking in the morning to try to head off morning time diarrhea.  Progress report 1 week after starting medications.  Colonoscopy will shed further light on possible etiologies.  May benefit from random colon biopsies.  Follow-up in 4 months.

## 2019-11-18 ENCOUNTER — Other Ambulatory Visit: Payer: Self-pay | Admitting: Nephrology

## 2019-11-18 ENCOUNTER — Other Ambulatory Visit (HOSPITAL_COMMUNITY): Payer: Self-pay | Admitting: Nephrology

## 2019-11-18 DIAGNOSIS — E119 Type 2 diabetes mellitus without complications: Secondary | ICD-10-CM | POA: Diagnosis not present

## 2019-11-18 DIAGNOSIS — Z79899 Other long term (current) drug therapy: Secondary | ICD-10-CM | POA: Diagnosis not present

## 2019-11-18 DIAGNOSIS — I10 Essential (primary) hypertension: Secondary | ICD-10-CM | POA: Diagnosis not present

## 2019-11-18 DIAGNOSIS — N189 Chronic kidney disease, unspecified: Secondary | ICD-10-CM | POA: Diagnosis not present

## 2019-11-18 DIAGNOSIS — I482 Chronic atrial fibrillation, unspecified: Secondary | ICD-10-CM | POA: Diagnosis not present

## 2019-11-18 DIAGNOSIS — I1310 Hypertensive heart and chronic kidney disease without heart failure, with stage 1 through stage 4 chronic kidney disease, or unspecified chronic kidney disease: Secondary | ICD-10-CM | POA: Diagnosis not present

## 2019-11-18 DIAGNOSIS — I5042 Chronic combined systolic (congestive) and diastolic (congestive) heart failure: Secondary | ICD-10-CM | POA: Diagnosis not present

## 2019-11-18 DIAGNOSIS — E875 Hyperkalemia: Secondary | ICD-10-CM | POA: Diagnosis not present

## 2019-11-18 DIAGNOSIS — E1122 Type 2 diabetes mellitus with diabetic chronic kidney disease: Secondary | ICD-10-CM | POA: Diagnosis not present

## 2019-11-18 DIAGNOSIS — I130 Hypertensive heart and chronic kidney disease with heart failure and stage 1 through stage 4 chronic kidney disease, or unspecified chronic kidney disease: Secondary | ICD-10-CM | POA: Diagnosis not present

## 2019-11-18 DIAGNOSIS — E1129 Type 2 diabetes mellitus with other diabetic kidney complication: Secondary | ICD-10-CM | POA: Diagnosis not present

## 2019-11-18 DIAGNOSIS — I129 Hypertensive chronic kidney disease with stage 1 through stage 4 chronic kidney disease, or unspecified chronic kidney disease: Secondary | ICD-10-CM | POA: Diagnosis not present

## 2019-11-18 DIAGNOSIS — G4733 Obstructive sleep apnea (adult) (pediatric): Secondary | ICD-10-CM | POA: Diagnosis not present

## 2019-11-18 DIAGNOSIS — R809 Proteinuria, unspecified: Secondary | ICD-10-CM | POA: Diagnosis not present

## 2019-11-18 DIAGNOSIS — E782 Mixed hyperlipidemia: Secondary | ICD-10-CM | POA: Diagnosis not present

## 2019-11-18 DIAGNOSIS — I5032 Chronic diastolic (congestive) heart failure: Secondary | ICD-10-CM | POA: Diagnosis not present

## 2019-11-18 DIAGNOSIS — G9009 Other idiopathic peripheral autonomic neuropathy: Secondary | ICD-10-CM | POA: Diagnosis not present

## 2019-11-18 DIAGNOSIS — D638 Anemia in other chronic diseases classified elsewhere: Secondary | ICD-10-CM | POA: Diagnosis not present

## 2019-11-18 NOTE — Telephone Encounter (Signed)
Thanks for your help Lattie Haw!

## 2019-11-23 DIAGNOSIS — I482 Chronic atrial fibrillation, unspecified: Secondary | ICD-10-CM | POA: Diagnosis not present

## 2019-11-23 DIAGNOSIS — I5032 Chronic diastolic (congestive) heart failure: Secondary | ICD-10-CM | POA: Diagnosis not present

## 2019-11-23 DIAGNOSIS — G9009 Other idiopathic peripheral autonomic neuropathy: Secondary | ICD-10-CM | POA: Diagnosis not present

## 2019-11-23 DIAGNOSIS — I129 Hypertensive chronic kidney disease with stage 1 through stage 4 chronic kidney disease, or unspecified chronic kidney disease: Secondary | ICD-10-CM | POA: Diagnosis not present

## 2019-11-23 DIAGNOSIS — D509 Iron deficiency anemia, unspecified: Secondary | ICD-10-CM | POA: Diagnosis not present

## 2019-11-23 DIAGNOSIS — E782 Mixed hyperlipidemia: Secondary | ICD-10-CM | POA: Diagnosis not present

## 2019-11-23 DIAGNOSIS — E1122 Type 2 diabetes mellitus with diabetic chronic kidney disease: Secondary | ICD-10-CM | POA: Diagnosis not present

## 2019-11-23 DIAGNOSIS — N1831 Chronic kidney disease, stage 3a: Secondary | ICD-10-CM | POA: Diagnosis not present

## 2019-11-23 DIAGNOSIS — G4733 Obstructive sleep apnea (adult) (pediatric): Secondary | ICD-10-CM | POA: Diagnosis not present

## 2019-11-23 DIAGNOSIS — Z8673 Personal history of transient ischemic attack (TIA), and cerebral infarction without residual deficits: Secondary | ICD-10-CM | POA: Diagnosis not present

## 2019-11-24 ENCOUNTER — Ambulatory Visit (HOSPITAL_COMMUNITY)
Admission: RE | Admit: 2019-11-24 | Discharge: 2019-11-24 | Disposition: A | Discharge status: Home / Self Care | Payer: Medicare Other | Source: Ambulatory Visit | Attending: Nephrology | Admitting: Nephrology

## 2019-11-24 ENCOUNTER — Other Ambulatory Visit: Payer: Self-pay

## 2019-11-24 DIAGNOSIS — N189 Chronic kidney disease, unspecified: Secondary | ICD-10-CM

## 2019-11-24 DIAGNOSIS — N281 Cyst of kidney, acquired: Secondary | ICD-10-CM | POA: Diagnosis not present

## 2019-11-26 ENCOUNTER — Encounter: Payer: Self-pay | Admitting: Cardiology

## 2019-11-26 ENCOUNTER — Other Ambulatory Visit: Payer: Self-pay

## 2019-11-26 ENCOUNTER — Ambulatory Visit (INDEPENDENT_AMBULATORY_CARE_PROVIDER_SITE_OTHER): Payer: Medicare Other | Admitting: Cardiology

## 2019-11-26 VITALS — BP 102/58 | HR 66 | Ht 72.0 in | Wt 200.0 lb

## 2019-11-26 DIAGNOSIS — D638 Anemia in other chronic diseases classified elsewhere: Secondary | ICD-10-CM | POA: Diagnosis not present

## 2019-11-26 DIAGNOSIS — I255 Ischemic cardiomyopathy: Secondary | ICD-10-CM | POA: Diagnosis not present

## 2019-11-26 DIAGNOSIS — E559 Vitamin D deficiency, unspecified: Secondary | ICD-10-CM | POA: Diagnosis not present

## 2019-11-26 DIAGNOSIS — Z8673 Personal history of transient ischemic attack (TIA), and cerebral infarction without residual deficits: Secondary | ICD-10-CM | POA: Diagnosis not present

## 2019-11-26 DIAGNOSIS — I5042 Chronic combined systolic (congestive) and diastolic (congestive) heart failure: Secondary | ICD-10-CM | POA: Diagnosis not present

## 2019-11-26 DIAGNOSIS — I6350 Cerebral infarction due to unspecified occlusion or stenosis of unspecified cerebral artery: Secondary | ICD-10-CM

## 2019-11-26 DIAGNOSIS — Z79899 Other long term (current) drug therapy: Secondary | ICD-10-CM | POA: Diagnosis not present

## 2019-11-26 DIAGNOSIS — E1122 Type 2 diabetes mellitus with diabetic chronic kidney disease: Secondary | ICD-10-CM | POA: Diagnosis not present

## 2019-11-26 DIAGNOSIS — I129 Hypertensive chronic kidney disease with stage 1 through stage 4 chronic kidney disease, or unspecified chronic kidney disease: Secondary | ICD-10-CM | POA: Diagnosis not present

## 2019-11-26 DIAGNOSIS — N189 Chronic kidney disease, unspecified: Secondary | ICD-10-CM | POA: Diagnosis not present

## 2019-11-26 NOTE — Patient Instructions (Addendum)
Medication Instructions:    Your physician recommends that you continue on your current medications as directed. Please refer to the Current Medication list given to you today.  Labwork:  NONE  Testing/Procedures:  NONE  Follow-Up:  Your physician recommends that you schedule a follow-up appointment in: 6 months at the [place] office.  Any Other Special Instructions Will Be Listed Below (If Applicable).  If you need a refill on your cardiac medications before your next appointment, please call your pharmacy.

## 2019-11-26 NOTE — Progress Notes (Signed)
Cardiology Office Note  Date: 11/26/2019   ID: Murl, Nakazawa 05-Nov-1945, MRN [id number]  PCP:  [name], MD  Cardiologist:  Rozann Lesches, MD Electrophysiologist:  None   Chief Complaint  Patient presents with  . Cardiac follow-up    History of Present Illness: Dennis Yu is a 74 y.o. male last seen in December 2020.  He presents for a routine visit.  Reports NYHA class II-III dyspnea on exertion depending on level of activity, no chest pain or palpitations, no syncope.  Enjoys working outside, plans to raise a garden this summer.  He continues on Coumadin with follow-up in anticoagulation clinic.  2.2.  He does not report any spontaneous bleeding problems.  I reviewed his lab work from March as outlined below.  We went over his medications which he is tolerating.  I personally reviewed his ECG today which shows sinus rhythm with old anterolateral/infarct pattern as before.  Past Medical History:  Diagnosis Date  . Bronchitis 11/2012  . Cardiomyopathy    LVEF 25%  . Diabetes mellitus   . Erectile dysfunction   . Essential hypertension   . GERD (gastroesophageal reflux disease)   . History of colonic polyps   . Mixed hyperlipidemia   . 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 Obstructive sleep apnea   . Patent foramen ovale   . Phlebitis   . 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 Superficial phlebitis 12/10/2012  . Type 2 diabetes mellitus ([place])   . Vitamin D deficiency     Past Surgical History:  Procedure Laterality Date  . NO PAST SURGERIES      Current Outpatient Medications  Medication Sig Dispense Refill  . aspirin 81 MG tablet Take 81 mg by mouth daily.      . carvedilol (COREG) 12.5 MG tablet TAKE 1 TABLET TWICE A DAY  WITH MEALS 180 tablet 1  . furosemide (LASIX) 40 MG tablet Take 40 mg by mouth daily.    [name] gabapentin (NEURONTIN) 300 MG capsule Take 3 capsules (900 mg total) by mouth daily. 90 capsule 11  . glipiZIDE  (GLUCOTROL) 5 MG tablet Take 5 mg by mouth daily.     . NON FORMULARY legatrin pm Daily      . olmesartan (BENICAR) 20 MG tablet Take 1 tablet (20 mg total) by mouth daily. 90 tablet 3  . Omega-3 Fatty Acids (FISH OIL) 1000 MG CAPS Take by mouth.    [name] PROAIR HFA 108 (90 BASE) MCG/ACT inhaler Inhale 2 puffs into the lungs 4 (four) times daily.     . simvastatin (ZOCOR) 40 MG tablet Take 40 mg by mouth daily at 6 PM.     . warfarin (JANTOVEN) 5 MG tablet TAKE 1 TABLET DAILY EXCEPT 2 TABLETS ON  FRIDAYS 100 tablet 3  . spironolactone (ALDACTONE) 25 MG tablet Take 0.5 tablets (12.5 mg total) by mouth daily. 45 tablet 3   No current facility-administered medications for this visit.   Allergies:  Patient has no known allergies.   ROS:   Arthritic pains.  Physical Exam: VS:  BP (!) 102/58   Pulse 66   Ht 6' (1.829 m)   Wt 200 lb (90.7 kg)   SpO2 96%   BMI 27.12 kg/m , BMI Body mass index is 27.12 kg/m.  Wt Readings from Last 3 Encounters:  11/26/19 200 lb (90.7 kg)  11/17/19 201 lb 6.4 oz (91.4 kg)  10/22/19 203 lb 12.8 oz (92.4 kg)    General: Patient appears comfortable at rest. HEENT: Conjunctiva and lids normal, wearing a mask. Neck: Supple, no elevated JVP or carotid bruits, no thyromegaly. Lungs: Clear to auscultation, nonlabored breathing at rest. Cardiac: Regular rate and rhythm, no S3, 2/6 systolic murmur. Abdomen: Soft, nontender, bowel sounds present. Extremities: No pitting edema, distal pulses 2+.  ECG:  An ECG dated 04/29/2018 was personally reviewed today and demonstrated:  Sinus rhythm with old anterior lateral/inferior infarct pattern and repolarization abnormalities.  Recent Labwork: 10/22/2019: ALT 22; AST 25; BUN 31; Creatinine, Ser 1.71; Hemoglobin 11.6; Platelets 184; Potassium 4.2; Sodium 139     Component Value Date/Time   CHOL 145 04/05/2011 0545   TRIG 66 04/05/2011 0545   HDL 40 04/05/2011 0545   CHOLHDL 3.6 04/05/2011 0545   VLDL 13 04/05/2011 0545    LDLCALC 92 04/05/2011 0545    Other Studies Reviewed Today:  Echocardiogram 04/30/2019: 1. Left ventricular ejection fraction, by visual estimation, is 25%. The left ventricle has severely decreased function. Moderately increased left ventricular size. There is mildly increased left ventricular hypertrophy. 2. Multiple segmental abnormalities exist. See findings. 3. Definity contrast agent was given IV to delineate the left ventricular endocardial borders. 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 Inferior apical calcification consistent with old thrombus. 4. Left ventricular diastolic Doppler parameters are consistent with pseudonormalization pattern of LV diastolic filling. 5. Global right ventricle has normal systolic function.The right ventricular size is normal. No increase in right ventricular wall thickness. 6. Left atrial size was severely dilated. 7. Right atrial size was normal. 8. Mild to moderate aortic valve annular calcification. 9. The mitral valve is grossly normal. Mild mitral valve regurgitation. 10. The tricuspid valve is grossly normal. Tricuspid valve regurgitation is trivial. 11. The aortic valve is tricuspid Aortic valve regurgitation was not visualized by color flow Doppler. 12. The pulmonic valve was grossly normal. Pulmonic valve regurgitation is trivial by color flow Doppler. 13. Normal pulmonary artery systolic pressure. 14. The inferior vena cava is normal in size with greater than 50% respiratory variability, suggesting right atrial pressure of 3 mmHg. 15. The tricuspid regurgitant velocity is 2.33 m/s, and with an assumed right atrial pressure of 3 mmHg, the estimated right ventricular systolic pressure is normal at 24.7 mmHg.  Assessment and Plan:  1.  Ischemic cardiomyopathy with LVEF 25%.  Fluid status looks controlled at this time.  He has preferred conservative management without invasive testing.  Plan  is to continue aspirin, Coreg, Benicar (could not afford Entresto), Aldactone, Lasix, and Zocor.  Recent lab work reviewed.  2.  History of stroke with inconsistent documentation of PFO over time.  Continue Coumadin.  He is followed in the anticoagulation clinic.  3.  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  Medication Adjustments/Labs and Tests Ordered: Current medicines are reviewed at length with the patient today.  Concerns regarding medicines are outlined above.   Tests Ordered: Orders Placed This Encounter  Procedures  . EKG 12-Lead    Medication Changes: No orders of the defined types were placed in this encounter.   Disposition:  Follow up 6 months in the [place] office.  Signed, Satira Sark, MD, [facility] 11/26/2019 2:34 PM    [facility] [place], [place], [place] [address] Phone: [phone]; Fax: [phone]

## 2019-12-03 DIAGNOSIS — B351 Tinea unguium: Secondary | ICD-10-CM | POA: Diagnosis not present

## 2019-12-03 DIAGNOSIS — E1142 Type 2 diabetes mellitus with diabetic polyneuropathy: Secondary | ICD-10-CM | POA: Diagnosis not present

## 2019-12-03 DIAGNOSIS — L309 Dermatitis, unspecified: Secondary | ICD-10-CM | POA: Diagnosis not present

## 2019-12-22 ENCOUNTER — Ambulatory Visit (INDEPENDENT_AMBULATORY_CARE_PROVIDER_SITE_OTHER): Payer: Medicare Other | Admitting: Pharmacist

## 2019-12-22 ENCOUNTER — Other Ambulatory Visit: Payer: Self-pay

## 2019-12-22 ENCOUNTER — Telehealth: Payer: Self-pay | Admitting: Pharmacist

## 2019-12-22 DIAGNOSIS — Z5181 Encounter for therapeutic drug level monitoring: Secondary | ICD-10-CM

## 2019-12-22 DIAGNOSIS — I10 Essential (primary) hypertension: Secondary | ICD-10-CM | POA: Diagnosis not present

## 2019-12-22 DIAGNOSIS — Z86718 Personal history of other venous thrombosis and embolism: Secondary | ICD-10-CM | POA: Diagnosis not present

## 2019-12-22 DIAGNOSIS — I5032 Chronic diastolic (congestive) heart failure: Secondary | ICD-10-CM | POA: Diagnosis not present

## 2019-12-22 DIAGNOSIS — E782 Mixed hyperlipidemia: Secondary | ICD-10-CM | POA: Diagnosis not present

## 2019-12-22 DIAGNOSIS — I6350 Cerebral infarction due to unspecified occlusion or stenosis of unspecified cerebral artery: Secondary | ICD-10-CM

## 2019-12-22 DIAGNOSIS — N189 Chronic kidney disease, unspecified: Secondary | ICD-10-CM | POA: Diagnosis not present

## 2019-12-22 DIAGNOSIS — E1122 Type 2 diabetes mellitus with diabetic chronic kidney disease: Secondary | ICD-10-CM | POA: Diagnosis not present

## 2019-12-22 DIAGNOSIS — I6349 Cerebral infarction due to embolism of other cerebral artery: Secondary | ICD-10-CM | POA: Diagnosis not present

## 2019-12-22 DIAGNOSIS — I130 Hypertensive heart and chronic kidney disease with heart failure and stage 1 through stage 4 chronic kidney disease, or unspecified chronic kidney disease: Secondary | ICD-10-CM | POA: Diagnosis not present

## 2019-12-22 DIAGNOSIS — Z7901 Long term (current) use of anticoagulants: Secondary | ICD-10-CM

## 2019-12-22 DIAGNOSIS — I639 Cerebral infarction, unspecified: Secondary | ICD-10-CM | POA: Diagnosis not present

## 2019-12-22 LAB — POCT INR: INR: 3 (ref 2.0–3.0)

## 2019-12-22 NOTE — Patient Instructions (Signed)
Description   Continue warfarin 1 tablet daily except 2 tablets on Fridays.  Recheck in 6 weeks.

## 2019-12-22 NOTE — Telephone Encounter (Signed)
Pt had INR check today and mentioned he is having a colonoscopy on 01/11/20 with Dr Gala Romney (scheduled in [place]).   He will need to hold his warfarin for 5 days prior to his colonoscopy. Pt takes warfarin for history of stroke in 2012 (also has history of PFO). He did have a DVT > 10 years ago but did not continue on long term warfarin for this. Also has a history of LV mural thrombus that has since resolved. No history of afib. Long term warfarin was continued for history of stroke and PFO. He has not previously had to come off of his warfarin for a procedure.  Will forward to Dr Domenic Polite for input on whether pt can hold warfarin for 5 days with or without a Lovenox bridge.

## 2019-12-22 NOTE — Telephone Encounter (Signed)
Scheduled pt to come for INR check on 6/1 to coordinate Lovenox bridge for 6/8 procedure.

## 2019-12-22 NOTE — Telephone Encounter (Signed)
I would lean toward Lovenox bridge in this case.

## 2019-12-24 DIAGNOSIS — I129 Hypertensive chronic kidney disease with stage 1 through stage 4 chronic kidney disease, or unspecified chronic kidney disease: Secondary | ICD-10-CM | POA: Diagnosis not present

## 2019-12-24 DIAGNOSIS — D638 Anemia in other chronic diseases classified elsewhere: Secondary | ICD-10-CM | POA: Diagnosis not present

## 2019-12-24 DIAGNOSIS — E559 Vitamin D deficiency, unspecified: Secondary | ICD-10-CM | POA: Diagnosis not present

## 2019-12-24 DIAGNOSIS — E1122 Type 2 diabetes mellitus with diabetic chronic kidney disease: Secondary | ICD-10-CM | POA: Diagnosis not present

## 2019-12-24 DIAGNOSIS — N281 Cyst of kidney, acquired: Secondary | ICD-10-CM | POA: Diagnosis not present

## 2019-12-24 DIAGNOSIS — I5042 Chronic combined systolic (congestive) and diastolic (congestive) heart failure: Secondary | ICD-10-CM | POA: Diagnosis not present

## 2019-12-24 DIAGNOSIS — N189 Chronic kidney disease, unspecified: Secondary | ICD-10-CM | POA: Diagnosis not present

## 2020-01-04 ENCOUNTER — Other Ambulatory Visit: Payer: Self-pay

## 2020-01-04 ENCOUNTER — Ambulatory Visit (INDEPENDENT_AMBULATORY_CARE_PROVIDER_SITE_OTHER): Payer: Medicare Other | Admitting: *Deleted

## 2020-01-04 DIAGNOSIS — Z7901 Long term (current) use of anticoagulants: Secondary | ICD-10-CM

## 2020-01-04 DIAGNOSIS — I6350 Cerebral infarction due to unspecified occlusion or stenosis of unspecified cerebral artery: Secondary | ICD-10-CM

## 2020-01-04 DIAGNOSIS — I639 Cerebral infarction, unspecified: Secondary | ICD-10-CM

## 2020-01-04 DIAGNOSIS — Z5181 Encounter for therapeutic drug level monitoring: Secondary | ICD-10-CM

## 2020-01-04 DIAGNOSIS — K265 Chronic or unspecified duodenal ulcer with perforation: Secondary | ICD-10-CM

## 2020-01-04 DIAGNOSIS — Z86718 Personal history of other venous thrombosis and embolism: Secondary | ICD-10-CM

## 2020-01-04 HISTORY — DX: Chronic or unspecified duodenal ulcer with perforation: K26.5

## 2020-01-04 LAB — POCT INR: INR: 2.2 (ref 2.0–3.0)

## 2020-01-04 MED ORDER — ENOXAPARIN SODIUM 120 MG/0.8ML ~~LOC~~ SOLN: 120.0000 mg | SUBCUTANEOUS | 0 refills | Status: DC

## 2020-01-04 NOTE — Patient Instructions (Addendum)
Your procedure is scheduled on: 01/11/2020  Report to [facility] at 8:45    AM.  Call this number if you have problems the morning of surgery: [phone]   Remember:              Follow Directions on the letter you received from Your Physician's office regarding the Bowel Prep              No Smoking the day of Procedure :   Take these medicines the morning of surgery with A SIP OF WATER: Coreg, and Benicar  Gabapetin and use Proair inhaler if needed.  Hold warfarin 5 days and bridge with lovenox as directed per office.              No diabetic medication morning  of procedure  Do not wear jewelry, make-up or nail polish.    Do not bring valuables to the hospital.  Contacts, dentures or bridgework may not be worn into surgery.  .   Patients discharged the day of surgery will not be allowed to drive home.     Colonoscopy, Adult, Care After This sheet gives you information about how to care for yourself after your procedure. Your health care provider may also give you more specific instructions. If you have problems or questions, contact your health care provider. What can I expect after the procedure? After the procedure, it is common to have:  A small amount of blood in your stool for 24 hours after the procedure.  Some gas.  Mild abdominal cramping or bloating.  Follow these instructions at home: General instructions   For the first 24 hours after the procedure: ? Do not drive or use machinery. ? Do not sign important documents. ? Do not drink alcohol. ? Do your regular daily activities at a slower pace than normal. ? Eat soft, easy-to-digest foods. ? Rest often.  Take over-the-counter or prescription medicines only as told by your health care provider.  It is up to you to get the results of your procedure. Ask your health care provider, or the department performing the procedure, when your results will be ready. Relieving cramping and bloating  Try walking around  when you have cramps or feel bloated.  Apply heat to your abdomen as told by your health care provider. Use a heat source that your health care provider recommends, such as a moist heat pack or a heating pad. ? Place a towel between your skin and the heat source. ? Leave the heat on for 20-30 minutes. ? Remove the heat if your skin turns bright red. This is especially important if you are unable to feel pain, heat, or cold. You may have a greater risk of getting burned. Eating and drinking  Drink enough fluid to keep your urine clear or pale yellow.  Resume your normal diet as instructed by your health care provider. Avoid heavy or fried foods that are hard to digest.  Avoid drinking alcohol for as long as instructed by your health care provider. Contact a health care provider if:  You have blood in your stool 2-3 days after the procedure. Get help right away if:  You have more than a small spotting of blood in your stool.  You pass large blood clots in your stool.  Your abdomen is swollen.  You have nausea or vomiting.  You have a fever.  You have increasing abdominal pain that is not relieved with medicine. This information is not intended  to replace advice given to you by your health care provider. Make sure you discuss any questions you have with your health care provider. Document Released: 03/05/2004 Document Revised: 04/15/2016 Document Reviewed: 10/03/2015 Elsevier Interactive Patient Education  [employer].

## 2020-01-04 NOTE — Patient Instructions (Signed)
Scheduled for colonoscopy 01/11/20  Will hold warfarin 5 days before procedure and bridge with Lovenox Labs 10/22/19  SCr 1.71  CrCl 50.59  Hgb 11.6  Hct 38.0  Plts 184K   Wt. 91.6kg  6/2 Take last dose of warfarin 6/3  No lovenox or warfarin 6/4 - 6/7  Lovenox 120mg  SQ daily at 8am 6/8  No lovenox------procedure-------Warfarin 10mg  pm 6/9  lovenox 120mg  sq 8am and warfarin 10mg  pm 6/10  lovenox 120mg  sq 8am and warfarin 5mg  pm 6/11  lovenox 120mg  sq 8am and warfarin 10mg  pm 6/12  lovenox 120mg  sq 8am and warfarin 5mg  pm 6/13  lovenox 120mg  sq 8am and warfarin 5mg  pm 6/14  lovenox 120mg  sq 8am and INR check at 8:30am

## 2020-01-07 ENCOUNTER — Encounter (HOSPITAL_COMMUNITY): Payer: Self-pay

## 2020-01-07 ENCOUNTER — Encounter (HOSPITAL_COMMUNITY)
Admission: RE | Admit: 2020-01-07 | Discharge: 2020-01-07 | Disposition: A | Discharge status: Home / Self Care | Payer: Medicare Other | Source: Ambulatory Visit | Attending: Internal Medicine | Admitting: Internal Medicine

## 2020-01-07 ENCOUNTER — Other Ambulatory Visit (HOSPITAL_COMMUNITY)
Admission: RE | Admit: 2020-01-07 | Discharge: 2020-01-07 | Disposition: A | Discharge status: Home / Self Care | Payer: Medicare Other | Source: Ambulatory Visit | Attending: Internal Medicine | Admitting: Internal Medicine

## 2020-01-07 ENCOUNTER — Other Ambulatory Visit: Payer: Self-pay

## 2020-01-07 DIAGNOSIS — Z20822 Contact with and (suspected) exposure to covid-19: Secondary | ICD-10-CM | POA: Diagnosis not present

## 2020-01-07 DIAGNOSIS — Z01812 Encounter for preprocedural laboratory examination: Secondary | ICD-10-CM | POA: Insufficient documentation

## 2020-01-07 LAB — BASIC METABOLIC PANEL
Anion gap: 9 (ref 5–15)
BUN: 34 mg/dL — ABNORMAL HIGH (ref 8–23)
CO2: 25 mmol/L (ref 22–32)
Calcium: 10.3 mg/dL (ref 8.9–10.3)
Chloride: 102 mmol/L (ref 98–111)
Creatinine, Ser: 1.67 mg/dL — ABNORMAL HIGH (ref 0.61–1.24)
GFR calc Af Amer: 47 mL/min — ABNORMAL LOW (ref >60)
GFR calc non Af Amer: 40 mL/min — ABNORMAL LOW (ref >60)
Glucose, Bld: 154 mg/dL — ABNORMAL HIGH (ref 70–99)
Potassium: 4.8 mmol/L (ref 3.5–5.1)
Sodium: 136 mmol/L (ref 135–145)

## 2020-01-07 LAB — SARS CORONAVIRUS 2 (TAT 6-24 HRS): SARS Coronavirus 2: NEGATIVE

## 2020-01-11 ENCOUNTER — Ambulatory Visit (HOSPITAL_COMMUNITY): Payer: Medicare Other | Admitting: Anesthesiology

## 2020-01-11 ENCOUNTER — Encounter (HOSPITAL_COMMUNITY)
Admission: RE | Disposition: A | Discharge status: Home / Self Care | Payer: Self-pay | Source: Home / Self Care | Attending: Internal Medicine

## 2020-01-11 ENCOUNTER — Encounter (HOSPITAL_COMMUNITY): Payer: Self-pay | Admitting: Internal Medicine

## 2020-01-11 ENCOUNTER — Ambulatory Visit (HOSPITAL_BASED_OUTPATIENT_CLINIC_OR_DEPARTMENT_OTHER)
Admission: RE | Admit: 2020-01-11 | Discharge: 2020-01-11 | Disposition: A | Discharge status: Home / Self Care | Payer: Medicare Other | Source: Home / Self Care | Attending: Internal Medicine | Admitting: Internal Medicine

## 2020-01-11 DIAGNOSIS — K640 First degree hemorrhoids: Secondary | ICD-10-CM | POA: Diagnosis not present

## 2020-01-11 DIAGNOSIS — I2510 Atherosclerotic heart disease of native coronary artery without angina pectoris: Secondary | ICD-10-CM | POA: Insufficient documentation

## 2020-01-11 DIAGNOSIS — D122 Benign neoplasm of ascending colon: Secondary | ICD-10-CM | POA: Insufficient documentation

## 2020-01-11 DIAGNOSIS — Z87891 Personal history of nicotine dependence: Secondary | ICD-10-CM | POA: Insufficient documentation

## 2020-01-11 DIAGNOSIS — Z09 Encounter for follow-up examination after completed treatment for conditions other than malignant neoplasm: Secondary | ICD-10-CM | POA: Diagnosis not present

## 2020-01-11 DIAGNOSIS — Z7901 Long term (current) use of anticoagulants: Secondary | ICD-10-CM | POA: Insufficient documentation

## 2020-01-11 DIAGNOSIS — Z8673 Personal history of transient ischemic attack (TIA), and cerebral infarction without residual deficits: Secondary | ICD-10-CM | POA: Insufficient documentation

## 2020-01-11 DIAGNOSIS — I110 Hypertensive heart disease with heart failure: Secondary | ICD-10-CM | POA: Diagnosis not present

## 2020-01-11 DIAGNOSIS — Z86010 Personal history of colonic polyps: Secondary | ICD-10-CM | POA: Insufficient documentation

## 2020-01-11 DIAGNOSIS — K635 Polyp of colon: Secondary | ICD-10-CM | POA: Diagnosis not present

## 2020-01-11 DIAGNOSIS — Z1211 Encounter for screening for malignant neoplasm of colon: Secondary | ICD-10-CM | POA: Insufficient documentation

## 2020-01-11 DIAGNOSIS — I509 Heart failure, unspecified: Secondary | ICD-10-CM | POA: Insufficient documentation

## 2020-01-11 DIAGNOSIS — G4733 Obstructive sleep apnea (adult) (pediatric): Secondary | ICD-10-CM | POA: Insufficient documentation

## 2020-01-11 DIAGNOSIS — G4730 Sleep apnea, unspecified: Secondary | ICD-10-CM | POA: Diagnosis not present

## 2020-01-11 DIAGNOSIS — Z79899 Other long term (current) drug therapy: Secondary | ICD-10-CM | POA: Insufficient documentation

## 2020-01-11 DIAGNOSIS — Z7982 Long term (current) use of aspirin: Secondary | ICD-10-CM | POA: Insufficient documentation

## 2020-01-11 DIAGNOSIS — K265 Chronic or unspecified duodenal ulcer with perforation: Secondary | ICD-10-CM | POA: Diagnosis not present

## 2020-01-11 DIAGNOSIS — Z7984 Long term (current) use of oral hypoglycemic drugs: Secondary | ICD-10-CM | POA: Insufficient documentation

## 2020-01-11 DIAGNOSIS — K5730 Diverticulosis of large intestine without perforation or abscess without bleeding: Secondary | ICD-10-CM | POA: Insufficient documentation

## 2020-01-11 DIAGNOSIS — I429 Cardiomyopathy, unspecified: Secondary | ICD-10-CM | POA: Insufficient documentation

## 2020-01-11 DIAGNOSIS — K275 Chronic or unspecified peptic ulcer, site unspecified, with perforation: Secondary | ICD-10-CM | POA: Diagnosis not present

## 2020-01-11 DIAGNOSIS — E782 Mixed hyperlipidemia: Secondary | ICD-10-CM | POA: Insufficient documentation

## 2020-01-11 DIAGNOSIS — E119 Type 2 diabetes mellitus without complications: Secondary | ICD-10-CM | POA: Insufficient documentation

## 2020-01-11 HISTORY — PX: POLYPECTOMY: SHX5525

## 2020-01-11 HISTORY — PX: BIOPSY: SHX5522

## 2020-01-11 HISTORY — PX: COLONOSCOPY WITH PROPOFOL: SHX5780

## 2020-01-11 LAB — GLUCOSE, CAPILLARY: Glucose-Capillary: 160 mg/dL — ABNORMAL HIGH (ref 70–99)

## 2020-01-11 SURGERY — COLONOSCOPY WITH PROPOFOL
Anesthesia: General

## 2020-01-11 MED ORDER — PROPOFOL 10 MG/ML IV BOLUS
INTRAVENOUS | Status: AC
  Filled 2020-01-11: qty 40

## 2020-01-11 MED ORDER — CHLORHEXIDINE GLUCONATE CLOTH 2 % EX PADS: 6.0000 | MEDICATED_PAD | Freq: Once | CUTANEOUS | Status: DC

## 2020-01-11 MED ORDER — LACTATED RINGERS IV SOLN
INTRAVENOUS | Status: DC | PRN
Start: 2020-01-11 — End: 2020-01-11

## 2020-01-11 MED ORDER — PHENYLEPHRINE HCL (PRESSORS) 10 MG/ML IV SOLN
INTRAVENOUS | Status: DC | PRN
Start: 2020-01-11 — End: 2020-01-11
  Administered 2020-01-11: 60 ug via INTRAVENOUS

## 2020-01-11 MED ORDER — PROPOFOL 10 MG/ML IV BOLUS
INTRAVENOUS | Status: DC | PRN
  Administered 2020-01-11: 20 mg via INTRAVENOUS
  Administered 2020-01-11: 10 mg via INTRAVENOUS
  Administered 2020-01-11: 20 mg via INTRAVENOUS
  Administered 2020-01-11 (×2): 10 mg via INTRAVENOUS
  Administered 2020-01-11: 20 mg via INTRAVENOUS
  Administered 2020-01-11: 40 mg via INTRAVENOUS
  Administered 2020-01-11 (×2): 20 mg via INTRAVENOUS

## 2020-01-11 MED ORDER — PHENYLEPHRINE 40 MCG/ML (10ML) SYRINGE FOR IV PUSH (FOR BLOOD PRESSURE SUPPORT)
PREFILLED_SYRINGE | INTRAVENOUS | Status: AC
  Filled 2020-01-11: qty 10

## 2020-01-11 MED ORDER — LACTATED RINGERS IV SOLN
Freq: Once | INTRAVENOUS | Status: AC
  Administered 2020-01-11: 1000 mL via INTRAVENOUS

## 2020-01-11 MED ORDER — PROPOFOL 500 MG/50ML IV EMUL
INTRAVENOUS | Status: DC | PRN
  Administered 2020-01-11: 100 ug/kg/min via INTRAVENOUS

## 2020-01-11 NOTE — Anesthesia Preprocedure Evaluation (Addendum)
Anesthesia Evaluation  Patient identified by MRN, date of birth, ID band Patient awake    Reviewed: Allergy & Precautions, NPO status , Patient's Chart, lab work & pertinent test results, reviewed documented beta blocker date and time   History of Anesthesia Complications Negative for: history of anesthetic complications  Airway Mallampati: II  TM Distance: >3 FB Neck ROM: Full    Dental  (+) Dental Advisory Given, Missing   Pulmonary sleep apnea , former smoker,    Pulmonary exam normal breath sounds clear to auscultation       Cardiovascular Exercise Tolerance: Good hypertension, Pt. on medications and Pt. on home beta blockers + CAD and +CHF (EF25%)  Normal cardiovascular exam Rhythm:Regular Rate:Normal  1. Left ventricular ejection fraction, by visual estimation, is 25%. The  left ventricle has severely decreased function. Moderately increased left  ventricular size. There is mildly increased left ventricular hypertrophy.  2. Multiple segmental abnormalities exist. See findings.  3. Definity contrast agent was given IV to delineate the left ventricular  endocardial borders. 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 Inferior apical calcification consistent  with old thrombus.  4. Left ventricular diastolic Doppler parameters are consistent with  pseudonormalization pattern of LV diastolic filling.  5. Global right ventricle has normal systolic function.The right  ventricular size is normal. No increase in right ventricular wall  thickness.  6. Left atrial size was severely dilated.  7. Right atrial size was normal.  8. Mild to moderate aortic valve annular calcification.  9. The mitral valve is grossly normal. Mild mitral valve regurgitation.  10. The tricuspid valve is grossly normal. Tricuspid valve regurgitation  is trivial.  11. The aortic valve is tricuspid Aortic  valve regurgitation was not  visualized by color flow Doppler.  12. The pulmonic valve was grossly normal. Pulmonic valve regurgitation is  trivial by color flow Doppler.  13. Normal pulmonary artery systolic pressure.  14. The inferior vena cava is normal in size with greater than 50%  respiratory variability, suggesting right atrial pressure of 3 mmHg.  15. The tricuspid regurgitant velocity is 2.33 m/s, and with an assumed  right atrial pressure of 3 mmHg, the estimated right ventricular systolic  pressure is normal at 24.7 mmHg.    Neuro/Psych CVA, Residual Symptoms    GI/Hepatic Neg liver ROS, GERD  Medicated and Controlled,  Endo/Other  diabetes, Well Controlled, Type 2, Oral Hypoglycemic Agents  Renal/GU      Musculoskeletal negative musculoskeletal ROS (+)   Abdominal   Peds  Hematology  (+) anemia ,   Anesthesia Other Findings Stopped coumadin and bridged with Lovenox, last dose of Lovenox -01/10/20  Reproductive/Obstetrics negative OB ROS                            Anesthesia Physical Anesthesia Plan  ASA: IV  Anesthesia Plan: General   Post-op Pain Management:    Induction: Intravenous  PONV Risk Score and Plan: 2 and TIVA  Airway Management Planned: Nasal Cannula, Natural Airway and Simple Face Mask  Additional Equipment:   Intra-op Plan:   Post-operative Plan:   Informed Consent: I have reviewed the patients History and Physical, chart, labs and discussed the procedure including the risks, benefits and alternatives for the proposed anesthesia with the patient or authorized representative who has indicated his/her understanding and acceptance.     Dental advisory given  Plan Discussed with: CRNA and Surgeon  Anesthesia Plan  Comments:         Anesthesia Quick Evaluation

## 2020-01-11 NOTE — Anesthesia Postprocedure Evaluation (Signed)
Anesthesia Post Note  Patient: Dennis Yu  Procedure(s) Performed: COLONOSCOPY WITH PROPOFOL (N/A ) POLYPECTOMY BIOPSY  Patient location during evaluation: PACU Anesthesia Type: General Level of consciousness: awake and alert and oriented Pain management: pain level controlled Vital Signs Assessment: post-procedure vital signs reviewed and stable Respiratory status: spontaneous breathing Cardiovascular status: blood pressure returned to baseline and stable Postop Assessment: no apparent nausea or vomiting Anesthetic complications: no     Last Vitals:  Vitals:   01/11/20 0902 01/11/20 1045  BP: (!) 159/93 (!) (P) 85/64  Pulse: 70   Resp: 20 (P) 14  Temp: 37.2 C   SpO2: 98% (P) 92%    Last Pain:  Vitals:   01/11/20 1000  TempSrc:   PainSc: 0-No pain                 Cherry Turlington

## 2020-01-11 NOTE — Op Note (Signed)
[facility] Patient Name: Dennis Yu Procedure Date: 01/11/2020 9:42 AM MRN: [id number] Date of Birth: 30-Jun-1946 Attending MD: Norvel Richards , MD CSN: [id number] Age: 74 Admit Type: Outpatient Procedure:                Colonoscopy Indications:              High risk colon cancer surveillance: Personal                            history of colonic polyps /chronic diarrhea Providers:                Norvel Richards, MD, Janeece Riggers, RN, Crystal                            Page, Randa Spike, Technician Referring MD:              Medicines:                Propofol per Anesthesia Complications:            No immediate complications. Estimated Blood Loss:     Estimated blood loss: minimal. Procedure:                Pre-Anesthesia Assessment:                           - Prior to the procedure, a History and Physical                            was performed, and patient medications and                            allergies were reviewed. The patient's tolerance of                            previous anesthesia was also reviewed. The risks                            and benefits of the procedure and the sedation                            options and risks were discussed with the patient.                            All questions were answered, and informed consent                            was obtained. Prior Anticoagulants: The patient                            last took Coumadin (warfarin) 5 days and Lovenox                            (enoxaparin) 1 day prior to the procedure. ASA  Grade Assessment: III - A patient with severe                            systemic disease. After reviewing the risks and                            benefits, the patient was deemed in satisfactory                            condition to undergo the procedure.                           After obtaining informed consent, the colonoscope                            was  passed under direct vision. Throughout the                            procedure, the patient's blood pressure, pulse, and                            oxygen saturations were monitored continuously. The                            CF-[id number] ([id number]) scope was introduced through                            the anus and advanced to the the cecum, identified                            by appendiceal orifice and ileocecal valve. The                            colonoscopy was performed without difficulty. The                            patient tolerated the procedure well. The quality                            of the bowel preparation was adequate. Scope In: 10:07:12 AM Scope Out: 10:37:26 AM Scope Withdrawal Time: 0 hours 20 minutes 2 seconds  Total Procedure Duration: 0 hours 30 minutes 14 seconds  Findings:      The perianal and digital rectal examinations were normal.      Many small and large-mouthed diverticula were found in the sigmoid colon       and descending colon.      A 5 mm polyp was found in the ascending colon. The polyp was sessile.       The polyp was removed with a cold snare. Resection and retrieval were       complete. Estimated blood loss was minimal. Biopsies of the right left       colon taken to further evaluate chronic diarrhea.      Non-bleeding internal hemorrhoids were found during retroflexion. The       hemorrhoids were  moderate, medium-sized and Grade I (internal       hemorrhoids that do not prolapse).      The exam was otherwise without abnormality on direct and retroflexion       views. Impression:               - Diverticulosis in the sigmoid colon and in the                            descending colon.                           - One 5 mm polyp in the ascending colon, removed                            with a cold snare. Resected and retrieved. Status                            post segmental biopsy.                           - Non-bleeding internal  hemorrhoids.                           - The examination was otherwise normal on direct                            and retroflexion views. Moderate Sedation:      Moderate (conscious) sedation was personally administered by an       anesthesia professional. The following parameters were monitored: oxygen       saturation, heart rate, blood pressure, respiratory rate, EKG, adequacy       of pulmonary ventilation, and response to care. Recommendation:           - Patient has a contact number available for                            emergencies. The signs and symptoms of potential                            delayed complications were discussed with the                            patient. Return to normal activities tomorrow.                            Written discharge instructions were provided to the                            patient.                           - Advance diet as tolerated.                           - Continue present medications. Resume Lovenox  today. Resume Coumadin today. Continue Lovenox                            until further recommendations provided by the                            Coumadin clinic.                           - No repeat colonoscopy due to age.                           - Return to GI clinic (date not yet determined). Procedure Code(s):        --- Professional ---                           [phone], Colonoscopy, flexible; with removal of                            tumor(s), polyp(s), or other lesion(s) by snare                            technique Diagnosis Code(s):        --- Professional ---                           Z86.010, Personal history of colonic polyps                           K64.0, First degree hemorrhoids                           K63.5, Polyp of colon                           K57.30, Diverticulosis of large intestine without                            perforation or abscess without bleeding CPT copyright 2019  American Medical Association. All rights reserved. The codes documented in this report are preliminary and upon coder review may  be revised to meet current compliance requirements. Cristopher Estimable. Chandel Zaun, MD Norvel Richards, MD 01/11/2020 10:52:23 AM This report has been signed electronically. Number of Addenda: 0

## 2020-01-11 NOTE — Transfer of Care (Signed)
Immediate Anesthesia Transfer of Care Note  Patient: Dennis Yu  Procedure(s) Performed: COLONOSCOPY WITH PROPOFOL (N/A ) POLYPECTOMY BIOPSY  Patient Location: PACU  Anesthesia Type:General  Level of Consciousness: awake  Airway & Oxygen Therapy: Patient Spontanous Breathing  Post-op Assessment: Report given to RN  Post vital signs: Reviewed  Last Vitals:  Vitals Value Taken Time  BP    Temp    Pulse    Resp 12 01/11/20 1049  SpO2    Vitals shown include unvalidated device data.  Last Pain:  Vitals:   01/11/20 1000  TempSrc:   PainSc: 0-No pain      Patients Stated Pain Goal: 8 (09/81/19 1478)  Complications: No apparent anesthesia complications

## 2020-01-11 NOTE — H&P (Signed)
@LOGO @   Primary Care Physician:  [name], MD Primary Gastroenterologist:  Dr. Gala Romney  Pre-Procedure History & Physical: HPI:  Dennis Yu is a 74 y.o. male here for colonoscopy given history of colonic polyps, chronic diarrhea.  Coumadin held; Lovenox bridging performed.  No Lovenox since yesterday morning at 08 100  Past Medical History:  Diagnosis Date  . Bronchitis 11/2012  . Cardiomyopathy    LVEF 25%  . Diabetes mellitus   . Erectile dysfunction   . Essential hypertension   . GERD (gastroesophageal reflux disease)   . History of colonic polyps   . Mixed hyperlipidemia   . 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 Obstructive sleep apnea   . Patent foramen ovale   . Phlebitis   . 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 Superficial phlebitis 12/10/2012  . Type 2 diabetes mellitus ([place])   . Vitamin D deficiency     Past Surgical History:  Procedure Laterality Date  . NO PAST SURGERIES      Prior to Admission medications   Medication Sig Start Date End Date Taking? Authorizing Provider  aspirin 81 MG tablet Take 81 mg by mouth daily.     Yes [provider]  carvedilol (COREG) 12.5 MG tablet TAKE 1 TABLET TWICE A DAY  WITH MEALS Patient taking differently: Take 12.5 mg by mouth 2 (two) times daily with a meal.  08/09/19  Yes Herminio Commons, MD  cholecalciferol (VITAMIN D3) 25 MCG (1000 UNIT) tablet Take 1,000 Units by mouth daily.   Yes [provider]  enoxaparin (LOVENOX) 120 MG/0.8ML injection Inject 0.8 mLs (120 mg total) into the skin daily for 10 days. 01/07/20 01/17/20 Yes Satira Sark, MD  furosemide (LASIX) 40 MG tablet Take 40 mg by mouth daily.   Yes [provider]  gabapentin (NEURONTIN) 300 MG capsule Take 3 capsules (900 mg total) by mouth daily. Patient taking differently: Take 300-600 mg by mouth See admin instructions. 300 mg during the day, 600 mg at bedtime 12/08/13  Yes Kefalas, Manon Hilding, PA-C   glipiZIDE (GLUCOTROL) 5 MG tablet Take 5 mg by mouth daily.  04/11/19  Yes [provider]  NON FORMULARY legatrin pm Daily     Yes [provider]  olmesartan (BENICAR) 20 MG tablet Take 1 tablet (20 mg total) by mouth daily. 07/23/19  Yes Satira Sark, MD  Omega-3 Fatty Acids (FISH OIL) 1000 MG CAPS Take 1,000 mg by mouth daily.    Yes [provider]  PROAIR HFA 108 (90 BASE) MCG/ACT inhaler Inhale 2 puffs into the lungs every 4 (four) hours as needed for wheezing or shortness of breath.  12/02/12  Yes [provider]  simvastatin (ZOCOR) 40 MG tablet Take 40 mg by mouth daily at 6 PM.  02/17/18  Yes [provider]  spironolactone (ALDACTONE) 25 MG tablet Take 0.5 tablets (12.5 mg total) by mouth daily. 07/23/19 12/29/19 Yes Satira Sark, MD  warfarin (JANTOVEN) 5 MG tablet TAKE 1 TABLET DAILY EXCEPT 2 TABLETS ON  FRIDAYS Patient taking differently: Take 5-10 mg by mouth See admin instructions. TAKE 1 (5 mg) TABLET DAILY EXCEPT 2 TABLETS (10 mg)  ON Mondays and FRIDAYS 11/15/19  Yes Satira Sark, MD    Allergies as of 11/17/2019  . (No Known Allergies)    Family History  Problem Relation Age of Onset  . Diabetes Father   . Hypertension  Other   . Colon cancer Neg Hx     Social History   Socioeconomic History  . Marital status: Married    Spouse name: Not on file  . Number of children: Not on file  . Years of education: Not on file  . Highest education level: Not on file  Occupational History  . Occupation: Truck Education administrator: RETIRED  Tobacco Use  . Smoking status: Former Smoker    Types: Cigarettes    Start date: 08/06/1967    Quit date: 08/06/2003    Years since quitting: 16.4  . Smokeless tobacco: Never Used  Substance and Sexual Activity  . Alcohol use: No    Alcohol/week: 0.0 standard drinks  . Drug use: No  . Sexual activity: Not Currently  Other Topics Concern  . Not on file  Social History  Narrative  . Not on file   Social Determinants of Health   Financial Resource Strain:   . Difficulty of Paying Living Expenses:   Food Insecurity:   . Worried About Charity fundraiser in the Last Year:   . Arboriculturist in the Last Year:   Transportation Needs:   . Film/video editor (Medical):   [name] Lack of Transportation (Non-Medical):   Physical Activity:   . Days of Exercise per Week:   . Minutes of Exercise per Session:   Stress:   . Feeling of Stress :   Social Connections:   . Frequency of Communication with Friends and Family:   . Frequency of Social Gatherings with Friends and Family:   . Attends Religious Services:   . Active Member of Clubs or Organizations:   . Attends Archivist Meetings:   [name] Marital Status:   Intimate Partner Violence:   . Fear of Current or Ex-Partner:   . Emotionally Abused:   [name] Physically Abused:   . Sexually Abused:     Review of Systems: See HPI, otherwise negative ROS  Physical Exam: BP (!) 159/93   Pulse 70   Temp 99 F (37.2 C) (Oral)   Resp 20   SpO2 98%  General:   Alert,  Well-developed, well-nourished, pleasant and cooperative in NAD SNeck:  Supple; no masses or thyromegaly. No significant cervical adenopathy. Lungs:  Clear throughout to auscultation.   No wheezes, crackles, or rhonchi. No acute distress. Heart:  Regular rate and rhythm; no murmurs, clicks, rubs,  or gallops. Abdomen: Non-distended, normal bowel sounds.  Soft and nontender without appreciable mass or hepatosplenomegaly.  Pulses:  Normal pulses noted. Extremities:  Without clubbing or edema.  Impression/Plan: 74 year old gentleman here for colonoscopy, given history colonic polyps and chronic diarrhea. The risks, benefits, limitations, alternatives and imponderables have been reviewed with the patient. Questions have been answered. All parties are agreeable.      Notice: This dictation was prepared with Dragon dictation along with smaller  phrase technology. Any transcriptional errors that result from this process are unintentional and may not be corrected upon review.

## 2020-01-11 NOTE — Discharge Instructions (Signed)
Monitored Anesthesia Care, Care After These instructions provide you with information about caring for yourself after your procedure. Your health care provider may also give you more specific instructions. Your treatment has been planned according to current medical practices, but problems sometimes occur. Call your health care provider if you have any problems or questions after your procedure. What can I expect after the procedure? After your procedure, you may:  Feel sleepy for several hours.  Feel clumsy and have poor balance for several hours.  Feel forgetful about what happened after the procedure.  Have poor judgment for several hours.  Feel nauseous or vomit.  Have a sore throat if you had a breathing tube during the procedure. Follow these instructions at home: For at least 24 hours after the procedure:      Have a responsible adult stay with you. It is important to have someone help care for you until you are awake and alert.  Rest as needed.  Do not: ? Participate in activities in which you could fall or become injured. ? Drive. ? Use heavy machinery. ? Drink alcohol. ? Take sleeping pills or medicines that cause drowsiness. ? Make important decisions or sign legal documents. ? Take care of children on your own. Eating and drinking  Follow the diet that is recommended by your health care provider.  If you vomit, drink water, juice, or soup when you can drink without vomiting.  Make sure you have little or no nausea before eating solid foods. General instructions  Take over-the-counter and prescription medicines only as told by your health care provider.  If you have sleep apnea, surgery and certain medicines can increase your risk for breathing problems. Follow instructions from your health care provider about wearing your sleep device: ? Anytime you are sleeping, including during daytime naps. ? While taking prescription pain medicines, sleeping medicines,  or medicines that make you drowsy.  If you smoke, do not smoke without supervision.  Keep all follow-up visits as told by your health care provider. This is important. Contact a health care provider if:  You keep feeling nauseous or you keep vomiting.  You feel light-headed.  You develop a rash.  You have a fever. Get help right away if:  You have trouble breathing. Summary  For several hours after your procedure, you may feel sleepy and have poor judgment.  Have a responsible adult stay with you for at least 24 hours or until you are awake and alert. This information is not intended to replace advice given to you by your health care provider. Make sure you discuss any questions you have with your health care provider. Document Revised: 10/20/2017 Document Reviewed: 11/12/2015 Elsevier Patient Education  [place]. Colon Polyps  Polyps are tissue growths inside the body. Polyps can grow in many places, including the large intestine (colon). A polyp may be a round bump or a mushroom-shaped growth. You could have one polyp or several. Most colon polyps are noncancerous (benign). However, some colon polyps can become cancerous over time. Finding and removing the polyps early can help prevent this. What are the causes? The exact cause of colon polyps is not known. What increases the risk? You are more likely to develop this condition if you:  Have a family history of colon cancer or colon polyps.  Are older than 29 or older than 45 if you are African American.  Have inflammatory bowel disease, such as ulcerative colitis or Crohn's disease.  Have certain hereditary  conditions, such as: ? Familial adenomatous polyposis. ? Lynch syndrome. ? Turcot syndrome. ? Peutz-Jeghers syndrome.  Are overweight.  Smoke cigarettes.  Do not get enough exercise.  Drink too much alcohol.  Eat a diet that is high in fat and red meat and low in fiber.  Had childhood cancer that  was treated with abdominal radiation. What are the signs or symptoms? Most polyps do not cause symptoms. If you have symptoms, they may include:  Blood coming from your rectum when having a bowel movement.  Blood in your stool. The stool may look dark red or black.  Abdominal pain.  A change in bowel habits, such as constipation or diarrhea. How is this diagnosed? This condition is diagnosed with a colonoscopy. This is a procedure in which a lighted, flexible scope is inserted into the anus and then passed into the colon to examine the area. Polyps are sometimes found when a colonoscopy is done as part of routine cancer screening tests. How is this treated? Treatment for this condition involves removing any polyps that are found. Most polyps can be removed during a colonoscopy. Those polyps will then be tested for cancer. Additional treatment may be needed depending on the results of testing. Follow these instructions at home: Lifestyle  Maintain a healthy weight, or lose weight if recommended by your health care provider.  Exercise every day or as told by your health care provider.  Do not use any products that contain nicotine or tobacco, such as cigarettes and e-cigarettes. If you need help quitting, ask your health care provider.  If you drink alcohol, limit how much you have: ? 0-1 drink a day for women. ? 0-2 drinks a day for men.  Be aware of how much alcohol is in your drink. In the [place] one drink equals one 12 oz bottle of beer (355 mL), one 5 oz glass of wine (148 mL), or one 1 oz shot of hard liquor (44 mL). Eating and drinking   Eat foods that are high in fiber, such as fruits, vegetables, and whole grains.  Eat foods that are high in calcium and vitamin D, such as milk, cheese, yogurt, eggs, liver, fish, and broccoli.  Limit foods that are high in fat, such as fried foods and desserts.  Limit the amount of red meat and processed meat you eat, such as hot dogs,  sausage, bacon, and lunch meats. General instructions  Keep all follow-up visits as told by your health care provider. This is important. ? This includes having regularly scheduled colonoscopies. ? Talk to your health care provider about when you need a colonoscopy. Contact a health care provider if:  You have new or worsening bleeding during a bowel movement.  You have new or increased blood in your stool.  You have a change in bowel habits.  You lose weight for no known reason. Summary  Polyps are tissue growths inside the body. Polyps can grow in many places, including the colon.  Most colon polyps are noncancerous (benign), but some can become cancerous over time.  This condition is diagnosed with a colonoscopy.  Treatment for this condition involves removing any polyps that are found. Most polyps can be removed during a colonoscopy. This information is not intended to replace advice given to you by your health care provider. Make sure you discuss any questions you have with your health care provider. Document Revised: 11/06/2017 Document Reviewed: 11/06/2017 Elsevier Patient Education  [employer]. Diverticulosis  Diverticulosis is a  condition that develops when small pouches (diverticula) form in the wall of the large intestine (colon). The colon is where water is absorbed and stool (feces) is formed. The pouches form when the inside layer of the colon pushes through weak spots in the outer layers of the colon. You may have a few pouches or many of them. The pouches usually do not cause problems unless they become inflamed or infected. When this happens, the condition is called diverticulitis. What are the causes? The cause of this condition is not known. What increases the risk? The following factors may make you more likely to develop this condition:  Being older than age 89. Your risk for this condition increases with age. Diverticulosis is rare among people  younger than age 16. By age 70, many people have it.  Eating a low-fiber diet.  Having frequent constipation.  Being overweight.  Not getting enough exercise.  Smoking.  Taking over-the-counter pain medicines, like aspirin and ibuprofen.  Having a family history of diverticulosis. What are the signs or symptoms? In most people, there are no symptoms of this condition. If you do have symptoms, they may include:  Bloating.  Cramps in the abdomen.  Constipation or diarrhea.  Pain in the lower left side of the abdomen. How is this diagnosed? Because diverticulosis usually has no symptoms, it is most often diagnosed during an exam for other colon problems. The condition may be diagnosed by:  Using a flexible scope to examine the colon (colonoscopy).  Taking an X-ray of the colon after dye has been put into the colon (barium enema).  Having a CT scan. How is this treated? You may not need treatment for this condition. Your health care provider may recommend treatment to prevent problems. You may need treatment if you have symptoms or if you previously had diverticulitis. Treatment may include:  Eating a high-fiber diet.  Taking a fiber supplement.  Taking a live bacteria supplement (probiotic).  Taking medicine to relax your colon. Follow these instructions at home: Medicines  Take over-the-counter and prescription medicines only as told by your health care provider.  If told by your health care provider, take a fiber supplement or probiotic. Constipation prevention Your condition may cause constipation. To prevent or treat constipation, you may need to:  Drink enough fluid to keep your urine pale yellow.  Take over-the-counter or prescription medicines.  Eat foods that are high in fiber, such as beans, whole grains, and fresh fruits and vegetables.  Limit foods that are high in fat and processed sugars, such as fried or sweet foods.  General instructions  Try  not to strain when you have a bowel movement.  Keep all follow-up visits as told by your health care provider. This is important. Contact a health care provider if you:  Have pain in your abdomen.  Have bloating.  Have cramps.  Have not had a bowel movement in 3 days. Get help right away if:  Your pain gets worse.  Your bloating becomes very bad.  You have a fever or chills, and your symptoms suddenly get worse.  You vomit.  You have bowel movements that are bloody or black.  You have bleeding from your rectum. Summary  Diverticulosis is a condition that develops when small pouches (diverticula) form in the wall of the large intestine (colon).  You may have a few pouches or many of them.  This condition is most often diagnosed during an exam for other colon problems.  Treatment may  include increasing the fiber in your diet, taking supplements, or taking medicines. This information is not intended to replace advice given to you by your health care provider. Make sure you discuss any questions you have with your health care provider. Document Revised: 02/18/2019 Document Reviewed: 02/18/2019 Elsevier Patient Education  [place].  Colonoscopy Discharge Instructions  Read the instructions outlined below and refer to this sheet in the next few weeks. These discharge instructions provide you with general information on caring for yourself after you leave the hospital. Your doctor may also give you specific instructions. While your treatment has been planned according to the most current medical practices available, unavoidable complications occasionally occur. If you have any problems or questions after discharge, call Dr. Gala Romney at [phone]. ACTIVITY  You may resume your regular activity, but move at a slower pace for the next 24 hours.   Take frequent rest periods for the next 24 hours.   Walking will help get rid of the air and reduce the bloated feeling in your  belly (abdomen).   No driving for 24 hours (because of the medicine (anesthesia) used during the test).    Do not sign any important legal documents or operate any machinery for 24 hours (because of the anesthesia used during the test).  NUTRITION  Drink plenty of fluids.   You may resume your normal diet as instructed by your doctor.   Begin with a light meal and progress to your normal diet. Heavy or fried foods are harder to digest and may make you feel sick to your stomach (nauseated).   Avoid alcoholic beverages for 24 hours or as instructed.  MEDICATIONS  You may resume your normal medications unless your doctor tells you otherwise.  WHAT YOU CAN EXPECT TODAY  Some feelings of bloating in the abdomen.   Passage of more gas than usual.   Spotting of blood in your stool or on the toilet paper.  IF YOU HAD POLYPS REMOVED DURING THE COLONOSCOPY:  No aspirin products for 7 days or as instructed.   No alcohol for 7 days or as instructed.   Eat a soft diet for the next 24 hours.  FINDING OUT THE RESULTS OF YOUR TEST Not all test results are available during your visit. If your test results are not back during the visit, make an appointment with your caregiver to find out the results. Do not assume everything is normal if you have not heard from your caregiver or the medical facility. It is important for you to follow up on all of your test results.  SEEK IMMEDIATE MEDICAL ATTENTION IF:  You have more than a spotting of blood in your stool.   Your belly is swollen (abdominal distention).   You are nauseated or vomiting.   You have a temperature over 101.   You have abdominal pain or discomfort that is severe or gets worse throughout the day.   Diverticulosis and colon polyp information provided  1 polyp removed from your colon today and biopsies taken  Further recommendations to follow pending review of pathology report  Resume Lovenox today.  Resume Coumadin today.  Continue Lovenox until instructed to stop by the Coumadin clinic  At patient request I called Konrad Dolores at [phone] and reviewed results.

## 2020-01-12 ENCOUNTER — Encounter: Payer: Self-pay | Admitting: Internal Medicine

## 2020-01-12 LAB — SURGICAL PATHOLOGY

## 2020-01-13 ENCOUNTER — Emergency Department (HOSPITAL_COMMUNITY): Payer: Medicare Other

## 2020-01-13 ENCOUNTER — Inpatient Hospital Stay (HOSPITAL_COMMUNITY)
Admission: RE | Admit: 2020-01-13 | Discharge: 2020-01-21 | DRG: 381 | Disposition: A | Discharge status: Home / Self Care | Payer: Medicare Other | Attending: Internal Medicine | Admitting: Internal Medicine

## 2020-01-13 ENCOUNTER — Other Ambulatory Visit: Payer: Self-pay

## 2020-01-13 ENCOUNTER — Encounter (HOSPITAL_COMMUNITY): Payer: Self-pay | Admitting: Emergency Medicine

## 2020-01-13 DIAGNOSIS — I255 Ischemic cardiomyopathy: Secondary | ICD-10-CM | POA: Diagnosis present

## 2020-01-13 DIAGNOSIS — K5730 Diverticulosis of large intestine without perforation or abscess without bleeding: Secondary | ICD-10-CM | POA: Diagnosis not present

## 2020-01-13 DIAGNOSIS — Z7984 Long term (current) use of oral hypoglycemic drugs: Secondary | ICD-10-CM

## 2020-01-13 DIAGNOSIS — K529 Noninfective gastroenteritis and colitis, unspecified: Secondary | ICD-10-CM | POA: Diagnosis present

## 2020-01-13 DIAGNOSIS — Z79899 Other long term (current) drug therapy: Secondary | ICD-10-CM

## 2020-01-13 DIAGNOSIS — E875 Hyperkalemia: Secondary | ICD-10-CM | POA: Diagnosis not present

## 2020-01-13 DIAGNOSIS — K265 Chronic or unspecified duodenal ulcer with perforation: Principal | ICD-10-CM | POA: Diagnosis present

## 2020-01-13 DIAGNOSIS — Q211 Atrial septal defect: Secondary | ICD-10-CM

## 2020-01-13 DIAGNOSIS — Z833 Family history of diabetes mellitus: Secondary | ICD-10-CM

## 2020-01-13 DIAGNOSIS — Z20822 Contact with and (suspected) exposure to covid-19: Secondary | ICD-10-CM | POA: Diagnosis present

## 2020-01-13 DIAGNOSIS — K275 Chronic or unspecified peptic ulcer, site unspecified, with perforation: Secondary | ICD-10-CM

## 2020-01-13 DIAGNOSIS — I5020 Unspecified systolic (congestive) heart failure: Secondary | ICD-10-CM | POA: Diagnosis present

## 2020-01-13 DIAGNOSIS — E1122 Type 2 diabetes mellitus with diabetic chronic kidney disease: Secondary | ICD-10-CM | POA: Diagnosis present

## 2020-01-13 DIAGNOSIS — I959 Hypotension, unspecified: Secondary | ICD-10-CM | POA: Diagnosis not present

## 2020-01-13 DIAGNOSIS — K640 First degree hemorrhoids: Secondary | ICD-10-CM | POA: Diagnosis present

## 2020-01-13 DIAGNOSIS — E1165 Type 2 diabetes mellitus with hyperglycemia: Secondary | ICD-10-CM | POA: Diagnosis present

## 2020-01-13 DIAGNOSIS — D509 Iron deficiency anemia, unspecified: Secondary | ICD-10-CM | POA: Diagnosis present

## 2020-01-13 DIAGNOSIS — Z7982 Long term (current) use of aspirin: Secondary | ICD-10-CM | POA: Diagnosis not present

## 2020-01-13 DIAGNOSIS — K219 Gastro-esophageal reflux disease without esophagitis: Secondary | ICD-10-CM | POA: Diagnosis present

## 2020-01-13 DIAGNOSIS — Z87891 Personal history of nicotine dependence: Secondary | ICD-10-CM

## 2020-01-13 DIAGNOSIS — K5750 Diverticulosis of both small and large intestine without perforation or abscess without bleeding: Secondary | ICD-10-CM | POA: Diagnosis present

## 2020-01-13 DIAGNOSIS — K635 Polyp of colon: Secondary | ICD-10-CM | POA: Diagnosis present

## 2020-01-13 DIAGNOSIS — Z7901 Long term (current) use of anticoagulants: Secondary | ICD-10-CM | POA: Diagnosis not present

## 2020-01-13 DIAGNOSIS — R5383 Other fatigue: Secondary | ICD-10-CM | POA: Diagnosis not present

## 2020-01-13 DIAGNOSIS — E782 Mixed hyperlipidemia: Secondary | ICD-10-CM | POA: Diagnosis present

## 2020-01-13 DIAGNOSIS — R1084 Generalized abdominal pain: Secondary | ICD-10-CM | POA: Diagnosis not present

## 2020-01-13 DIAGNOSIS — N1831 Chronic kidney disease, stage 3a: Secondary | ICD-10-CM | POA: Diagnosis present

## 2020-01-13 DIAGNOSIS — I130 Hypertensive heart and chronic kidney disease with heart failure and stage 1 through stage 4 chronic kidney disease, or unspecified chronic kidney disease: Secondary | ICD-10-CM | POA: Diagnosis present

## 2020-01-13 DIAGNOSIS — Z8719 Personal history of other diseases of the digestive system: Secondary | ICD-10-CM

## 2020-01-13 DIAGNOSIS — E559 Vitamin D deficiency, unspecified: Secondary | ICD-10-CM | POA: Diagnosis present

## 2020-01-13 DIAGNOSIS — Z8249 Family history of ischemic heart disease and other diseases of the circulatory system: Secondary | ICD-10-CM

## 2020-01-13 DIAGNOSIS — G4733 Obstructive sleep apnea (adult) (pediatric): Secondary | ICD-10-CM | POA: Diagnosis present

## 2020-01-13 DIAGNOSIS — R21 Rash and other nonspecific skin eruption: Secondary | ICD-10-CM | POA: Diagnosis not present

## 2020-01-13 DIAGNOSIS — Z8673 Personal history of transient ischemic attack (TIA), and cerebral infarction without residual deficits: Secondary | ICD-10-CM

## 2020-01-13 DIAGNOSIS — E1121 Type 2 diabetes mellitus with diabetic nephropathy: Secondary | ICD-10-CM | POA: Diagnosis not present

## 2020-01-13 DIAGNOSIS — I5022 Chronic systolic (congestive) heart failure: Secondary | ICD-10-CM | POA: Diagnosis not present

## 2020-01-13 LAB — COMPREHENSIVE METABOLIC PANEL
ALT: 41 U/L (ref 0–44)
AST: 26 U/L (ref 15–41)
Albumin: 4.2 g/dL (ref 3.5–5.0)
Alkaline Phosphatase: 60 U/L (ref 38–126)
Anion gap: 10 (ref 5–15)
BUN: 31 mg/dL — ABNORMAL HIGH (ref 8–23)
CO2: 27 mmol/L (ref 22–32)
Calcium: 10.9 mg/dL — ABNORMAL HIGH (ref 8.9–10.3)
Chloride: 101 mmol/L (ref 98–111)
Creatinine, Ser: 1.55 mg/dL — ABNORMAL HIGH (ref 0.61–1.24)
GFR calc Af Amer: 51 mL/min — ABNORMAL LOW (ref >60)
GFR calc non Af Amer: 44 mL/min — ABNORMAL LOW (ref >60)
Glucose, Bld: 169 mg/dL — ABNORMAL HIGH (ref 70–99)
Potassium: 4.5 mmol/L (ref 3.5–5.1)
Sodium: 138 mmol/L (ref 135–145)
Total Bilirubin: 0.7 mg/dL (ref 0.3–1.2)
Total Protein: 8.4 g/dL — ABNORMAL HIGH (ref 6.5–8.1)

## 2020-01-13 LAB — SARS CORONAVIRUS 2 BY RT PCR (HOSPITAL ORDER, PERFORMED IN ~~LOC~~ HOSPITAL LAB): SARS Coronavirus 2: NEGATIVE

## 2020-01-13 LAB — LIPASE, BLOOD: Lipase: 30 U/L (ref 11–51)

## 2020-01-13 LAB — CBC
HCT: 42.4 % (ref 39.0–52.0)
Hemoglobin: 13.3 g/dL (ref 13.0–17.0)
MCH: 28.5 pg (ref 26.0–34.0)
MCHC: 31.4 g/dL (ref 30.0–36.0)
MCV: 90.8 fL (ref 80.0–100.0)
Platelets: 312 10*3/uL (ref 150–400)
RBC: 4.67 MIL/uL (ref 4.22–5.81)
RDW: 11.9 % (ref 11.5–15.5)
WBC: 15.2 10*3/uL — ABNORMAL HIGH (ref 4.0–10.5)
nRBC: 0 % (ref 0.0–0.2)

## 2020-01-13 LAB — GLUCOSE, CAPILLARY: Glucose-Capillary: 106 mg/dL — ABNORMAL HIGH (ref 70–99)

## 2020-01-13 LAB — PROTIME-INR
INR: 1.2 (ref 0.8–1.2)
Prothrombin Time: 14.5 seconds (ref 11.4–15.2)

## 2020-01-13 LAB — CBG MONITORING, ED: Glucose-Capillary: 148 mg/dL — ABNORMAL HIGH (ref 70–99)

## 2020-01-13 LAB — HEMOGLOBIN A1C
Hgb A1c MFr Bld: 7.8 % — ABNORMAL HIGH (ref 4.8–5.6)
Mean Plasma Glucose: 177.16 mg/dL

## 2020-01-13 MED ORDER — IOHEXOL 300 MG/ML  SOLN
100.0000 mL | Freq: Once | INTRAMUSCULAR | Status: AC | PRN
  Administered 2020-01-13: 100 mL via INTRAVENOUS

## 2020-01-13 MED ORDER — ACETAMINOPHEN 650 MG RE SUPP: 650.0000 mg | Freq: Four times a day (QID) | RECTAL | Status: DC | PRN

## 2020-01-13 MED ORDER — HYDROMORPHONE HCL 1 MG/ML IJ SOLN
1.0000 mg | Freq: Once | INTRAMUSCULAR | Status: AC
  Administered 2020-01-13: 1 mg via INTRAVENOUS
  Filled 2020-01-13: qty 1

## 2020-01-13 MED ORDER — LABETALOL HCL 5 MG/ML IV SOLN: 10.0000 mg | INTRAVENOUS | Status: DC | PRN

## 2020-01-13 MED ORDER — LACTATED RINGERS IV SOLN: INTRAVENOUS | Status: AC

## 2020-01-13 MED ORDER — HYDROMORPHONE HCL 1 MG/ML IJ SOLN
0.5000 mg | INTRAMUSCULAR | Status: DC | PRN
  Administered 2020-01-13 – 2020-01-14 (×2): 0.5 mg via INTRAVENOUS
  Filled 2020-01-13: qty 0.5
  Filled 2020-01-13: qty 1

## 2020-01-13 MED ORDER — INSULIN ASPART 100 UNIT/ML ~~LOC~~ SOLN
0.0000 [IU] | SUBCUTANEOUS | Status: DC
  Administered 2020-01-13 – 2020-01-15 (×4): 1 [IU] via SUBCUTANEOUS
  Administered 2020-01-15 – 2020-01-16 (×4): 2 [IU] via SUBCUTANEOUS
  Administered 2020-01-16: 1 [IU] via SUBCUTANEOUS
  Administered 2020-01-16: 2 [IU] via SUBCUTANEOUS
  Administered 2020-01-16: 1 [IU] via SUBCUTANEOUS
  Administered 2020-01-16 – 2020-01-17 (×2): 2 [IU] via SUBCUTANEOUS
  Administered 2020-01-17: 1 [IU] via SUBCUTANEOUS
  Administered 2020-01-17: 3 [IU] via SUBCUTANEOUS
  Administered 2020-01-17: 2 [IU] via SUBCUTANEOUS
  Administered 2020-01-17 – 2020-01-18 (×3): 3 [IU] via SUBCUTANEOUS
  Administered 2020-01-18 (×3): 2 [IU] via SUBCUTANEOUS
  Administered 2020-01-18: 3 [IU] via SUBCUTANEOUS
  Administered 2020-01-18: 2 [IU] via SUBCUTANEOUS
  Administered 2020-01-19 (×4): 3 [IU] via SUBCUTANEOUS
  Administered 2020-01-19: 2 [IU] via SUBCUTANEOUS
  Administered 2020-01-20: 1 [IU] via SUBCUTANEOUS
  Filled 2020-01-13: qty 1

## 2020-01-13 MED ORDER — ONDANSETRON HCL 4 MG PO TABS: 4.0000 mg | ORAL_TABLET | Freq: Four times a day (QID) | ORAL | Status: DC | PRN

## 2020-01-13 MED ORDER — PANTOPRAZOLE SODIUM 40 MG IV SOLR
40.0000 mg | Freq: Once | INTRAVENOUS | Status: AC
  Administered 2020-01-13: 40 mg via INTRAVENOUS
  Filled 2020-01-13: qty 40

## 2020-01-13 MED ORDER — ACETAMINOPHEN 325 MG PO TABS: 650.0000 mg | ORAL_TABLET | Freq: Four times a day (QID) | ORAL | Status: DC | PRN

## 2020-01-13 MED ORDER — PIPERACILLIN-TAZOBACTAM 3.375 G IVPB 30 MIN
3.3750 g | Freq: Once | INTRAVENOUS | Status: AC
  Administered 2020-01-13: 3.375 g via INTRAVENOUS
  Filled 2020-01-13: qty 50

## 2020-01-13 MED ORDER — ONDANSETRON HCL 4 MG/2ML IJ SOLN: 4.0000 mg | Freq: Four times a day (QID) | INTRAMUSCULAR | Status: DC | PRN

## 2020-01-13 MED ORDER — PANTOPRAZOLE SODIUM 40 MG IV SOLR
40.0000 mg | Freq: Two times a day (BID) | INTRAVENOUS | Status: DC
  Administered 2020-01-13 – 2020-01-21 (×17): 40 mg via INTRAVENOUS
  Filled 2020-01-13 (×17): qty 40

## 2020-01-13 MED ORDER — PIPERACILLIN-TAZOBACTAM 3.375 G IVPB
3.3750 g | Freq: Three times a day (TID) | INTRAVENOUS | Status: DC
  Administered 2020-01-13 – 2020-01-21 (×23): 3.375 g via INTRAVENOUS
  Filled 2020-01-13 (×21): qty 50

## 2020-01-13 MED ORDER — FLUCONAZOLE IN SODIUM CHLORIDE 400-0.9 MG/200ML-% IV SOLN
400.0000 mg | INTRAVENOUS | Status: DC
  Administered 2020-01-13 – 2020-01-20 (×8): 400 mg via INTRAVENOUS
  Filled 2020-01-13 (×12): qty 200

## 2020-01-13 NOTE — Consult Note (Signed)
[facility]  Reason for Consult: Duodenal perforation with collection  Referring Physician:  Dr. Wilson Singer   Chief Complaint    Abdominal Pain      HPI: RICHARDSON DUBREE is a 74 y.o. male with a history of HTN, CKD, Stroke, CHF w/ EF 25%, DM who comes in with 1 week history of abdominal pain. He had a colonoscopy on Tuesday for screening as he was due in 2015 and missed this for tubular adenomas, and says that his pain was worse on Tuesday compared to before. He has had some nausea, and has not wanted to eat much since Sunday because the pain is worsened by food. He has not had a Bm since the colonoscopy and denies any fevers or chills.  The pain is sharp and in the epigastric region. His pain is much better after dilaudid in the ED at 12:45PM.   He was unable to tolerate NG placement for decompression.   Past Medical History:  Diagnosis Date  . Bronchitis 11/2012  . Cardiomyopathy    LVEF 25%  . Diabetes mellitus   . Erectile dysfunction   . Essential hypertension   . GERD (gastroesophageal reflux disease)   . History of colonic polyps   . Mixed hyperlipidemia   . 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 Obstructive sleep apnea   . Patent foramen ovale   . Phlebitis   . 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 Superficial phlebitis 12/10/2012  . Type 2 diabetes mellitus ([place])   . Vitamin D deficiency     Past Surgical History:  Procedure Laterality Date  . NO PAST SURGERIES      Family History  Problem Relation Age of Onset  . Diabetes Father   . Hypertension Other   . Colon cancer Neg Hx     Social History   Tobacco Use  . Smoking status: Former Smoker    Types: Cigarettes    Start date: 08/06/1967    Quit date: 08/06/2003    Years since quitting: 16.4  . Smokeless tobacco: Never Used  Vaping Use  . Vaping Use: Never used  Substance Use Topics  . Alcohol use: No    Alcohol/week: 0.0 standard drinks  . Drug use: No     Medications: I have reviewed the patient's current medications. Current Facility-Administered Medications  Medication Dose Route Frequency Provider Last Rate Last Admin  . acetaminophen (TYLENOL) tablet 650 mg  650 mg Oral Q6H PRN Manuella Ghazi, Pratik D, DO       Or  . acetaminophen (TYLENOL) suppository 650 mg  650 mg Rectal Q6H PRN Manuella Ghazi, Pratik D, DO      . fluconazole (DIFLUCAN) IVPB 400 mg  400 mg Intravenous Q24H Manuella Ghazi, Pratik D, DO   Stopped at 01/13/20 1637  . HYDROmorphone (DILAUDID) injection 0.5-1 mg  0.5-1 mg Intravenous Q2H PRN Manuella Ghazi, Pratik D, DO      . insulin aspart (novoLOG) injection 0-9 Units  0-9 Units Subcutaneous Q4H Shah, Pratik D, DO   1 Units at 01/13/20 1636  . labetalol (NORMODYNE) injection 10 mg  10 mg Intravenous Q2H PRN Manuella Ghazi, Pratik D, DO      . lactated ringers infusion   Intravenous Continuous Heath Lark D, DO 75 mL/hr at 01/13/20 1741 New Bag at 01/13/20 1741  . ondansetron (ZOFRAN) tablet 4 mg  4 mg Oral Q6H PRN Manuella Ghazi, Pratik D, DO       Or  .  ondansetron (ZOFRAN) injection 4 mg  4 mg Intravenous Q6H PRN Manuella Ghazi, Pratik D, DO      . pantoprazole (PROTONIX) injection 40 mg  40 mg Intravenous Q12H Shah, Pratik D, DO   40 mg at 01/13/20 1751  . piperacillin-tazobactam (ZOSYN) IVPB 3.375 g  3.375 g Intravenous Q8H Coffee, Donna Christen, RPH        No Known Allergies   ROS:  A comprehensive review of systems was negative except for: Gastrointestinal: positive for abdominal pain, constipation and nausea  Blood pressure 125/78, pulse 71, temperature 98.8 F (37.1 C), temperature source Oral, resp. rate 17, height 6' (1.829 m), weight 84.5 kg, SpO2 96 %. Physical Exam Vitals reviewed.  Constitutional:      Appearance: He is normal weight.  HENT:     Head: Normocephalic and atraumatic.  Eyes:     Extraocular Movements: Extraocular movements intact.  Cardiovascular:     Rate and Rhythm: Normal rate.  Pulmonary:     Effort: Pulmonary effort is normal.  Abdominal:      General: There is no distension.     Palpations: Abdomen is soft.     Tenderness: There is no abdominal tenderness.     Hernia: No hernia is present.  Skin:    General: Skin is warm and dry.  Neurological:     General: No focal deficit present.     Mental Status: He is alert and oriented to person, place, and time.  Psychiatric:        Mood and Affect: Mood normal.        Behavior: Behavior normal.     Results: Results for orders placed or performed during the hospital encounter of 01/13/20 (from the past 48 hour(s))  Lipase, blood     Status: None   Collection Time: 01/13/20  9:44 AM  Result Value Ref Range   Lipase 30 11 - 51 U/L    Comment: Performed at [facility], [address], [place], [place] [address]  Comprehensive metabolic panel     Status: Abnormal   Collection Time: 01/13/20  9:44 AM  Result Value Ref Range   Sodium 138 135 - 145 mmol/L   Potassium 4.5 3.5 - 5.1 mmol/L   Chloride 101 98 - 111 mmol/L   CO2 27 22 - 32 mmol/L   Glucose, Bld 169 (H) 70 - 99 mg/dL    Comment: Glucose reference range applies only to samples taken after fasting for at least 8 hours.   BUN 31 (H) 8 - 23 mg/dL   Creatinine, Ser 1.55 (H) 0.61 - 1.24 mg/dL   Calcium 10.9 (H) 8.9 - 10.3 mg/dL   Total Protein 8.4 (H) 6.5 - 8.1 g/dL   Albumin 4.2 3.5 - 5.0 g/dL   AST 26 15 - 41 U/L   ALT 41 0 - 44 U/L   Alkaline Phosphatase 60 38 - 126 U/L   Total Bilirubin 0.7 0.3 - 1.2 mg/dL   GFR calc non Af Amer 44 (L) >60 mL/min   GFR calc Af Amer 51 (L) >60 mL/min   Anion gap 10 5 - 15    Comment: Performed at [facility], [address] [place],  [address]  CBC     Status: Abnormal   Collection Time: 01/13/20  9:44 AM  Result Value Ref Range   WBC 15.2 (H) 4.0 - 10.5 K/uL   RBC 4.67 4.22 - 5.81 MIL/uL   Hemoglobin 13.3 13.0 - 17.0 g/dL   HCT 42.4  39 - 52 %   MCV 90.8 80.0 - 100.0 fL   MCH 28.5 26.0 - 34.0 pg   MCHC 31.4 30.0 - 36.0 g/dL   RDW 11.9 11.5 - 15.5 %   Platelets 312  150 - 400 K/uL   nRBC 0.0 0.0 - 0.2 %    Comment: Performed at [facility], [address] [place], [place] [address]  Protime-INR     Status: None   Collection Time: 01/13/20  9:44 AM  Result Value Ref Range   Prothrombin Time 14.5 11.4 - 15.2 seconds   INR 1.2 0.8 - 1.2    Comment: (NOTE) INR goal varies based on device and disease states. Performed at [facility], [address], [place], [place] [address]   Hemoglobin A1c     Status: Abnormal   Collection Time: 01/13/20  9:44 AM  Result Value Ref Range   Hgb A1c MFr Bld 7.8 (H) 4.8 - 5.6 %    Comment: (NOTE) Pre diabetes:          5.7%-6.4%  Diabetes:              >6.4%  Glycemic control for   <7.0% adults with diabetes    Mean Plasma Glucose 177.16 mg/dL    Comment: Performed at [facility] [address], [place], [place] [address]  SARS Coronavirus 2 by RT PCR (hospital order, performed in [facility] hospital lab) Nasopharyngeal Nasopharyngeal Swab     Status: None   Collection Time: 01/13/20  2:39 PM   Specimen: Nasopharyngeal Swab  Result Value Ref Range   SARS Coronavirus 2 NEGATIVE NEGATIVE    Comment: (NOTE) SARS-CoV-2 target nucleic acids are NOT DETECTED.  The SARS-CoV-2 RNA is generally detectable in upper and lower respiratory specimens during the acute phase of infection. The lowest concentration of SARS-CoV-2 viral copies this assay can detect is 250 copies / mL. A negative result does not preclude SARS-CoV-2 infection and should not be used as the sole basis for treatment or other patient management decisions.  A negative result may occur with improper specimen collection / handling, submission of specimen other than nasopharyngeal swab, presence of viral mutation(s) within the areas targeted by this assay, and inadequate number of viral copies (<250 copies / mL). A negative result must be combined with clinical observations, patient history, and epidemiological information.  Fact Sheet for  Patients:   StrictlyIdeas.no  Fact Sheet for Healthcare Providers: BankingDealers.co.za  This test is not yet approved or  cleared by the [place] FDA and has been authorized for detection and/or diagnosis of SARS-CoV-2 by FDA under an Emergency Use Authorization (EUA).  This EUA will remain in effect (meaning this test can be used) for the duration of the COVID-19 declaration under Section 564(b)(1) of the Act, 21 U.S.C. section 360bbb-3(b)(1), unless the authorization is terminated or revoked sooner.  Performed at [facility], [address], [place], [place] [address]   CBG monitoring, ED     Status: Abnormal   Collection Time: 01/13/20  4:15 PM  Result Value Ref Range   Glucose-Capillary 148 (H) 70 - 99 mg/dL    Comment: Glucose reference range applies only to samples taken after fasting for at least 8 hours.    Personally reviewed- duodenum with thickening and contained air fluid level and appearance of a track between the collection and lumen CT ABDOMEN PELVIS W CONTRAST  Result Date: 01/13/2020 CLINICAL DATA:  Recent colonoscopy.  Mid abdominal pain. EXAM:  CT ABDOMEN AND PELVIS WITH CONTRAST TECHNIQUE: Multidetector CT imaging of the abdomen and pelvis was performed using the standard protocol following bolus administration of intravenous contrast. CONTRAST:  155mL OMNIPAQUE IOHEXOL 300 MG/ML  SOLN COMPARISON:  None. FINDINGS: Lower chest: No acute abnormality. Hepatobiliary: No focal hepatic abnormality. Gallbladder unremarkable. Unusual configuration of the liver with gallbladder noted posteriorly. Pancreas: No focal abnormality or ductal dilatation. Spleen: No focal abnormality.  Normal size. Adrenals/Urinary Tract: No adrenal abnormality. No focal renal abnormality. No stones or hydronephrosis. Urinary bladder is unremarkable. Stomach/Bowel: Marked inflammation noted around the 1st and 2nd portions of the duodenum. There is a  fluid collection noted anterior to the proximal descending duodenum which communicates with the duodenal lumen. This most likely reflects changes of severe duodenitis/peptic ulcer disease with a large peptic ulcer. Left colonic diverticulosis. No active diverticulitis. Normal appendix. Stomach and small bowel decompressed, unremarkable. Vascular/Lymphatic: Aortic atherosclerosis. No enlarged abdominal or pelvic lymph nodes. Reproductive: No visible focal abnormality. Other: No free fluid or free air. Musculoskeletal: No acute bony abnormality. IMPRESSION: Marked inflammation and wall thickening involving the proximal duodenum compatible with severe duodenitis/peptic ulcer disease. There is a fluid collection anterior to the proximal duodenum which communicates with the lumen, likely large peptic ulcer. Left colonic diverticulosis.  No active diverticulitis. Electronically Signed   By: Rolm Baptise M.D.   On: 01/13/2020 11:32     Assessment & Plan:  MACSEN NUTTALL is a 74 y.o. male with a duodenal perforation likely from ulcer disease but could be from something like cancer. This does not look like a diverticula given the location, and has been going on for several days ~ 7 days or so. At this point he is stable and has no pain. I am going to discuss the case and review with radiology in AM and see if we should go ahead and try UGI to see if we can feed him versus holding and doing in 4-5 days.   -Discussed with patient and family and discussed need for EGD as outpatient 4-6 weeks to ensure no cancer or other concerning finding  -Discussed plan for NPO, possible UGI in AM versus next week, potential need for TPN/ PICC, and plan for IV zoysn and fluconazole now for the perforation -Hold anticoagulation  -If needed surgery, would need to go to [facility] due to EF 25%   Discussed with Dr. Manuella Ghazi.   All questions were answered to the satisfaction of the patient and family.   Virl Cagey 01/13/2020,  6:04 PM

## 2020-01-13 NOTE — Plan of Care (Signed)

## 2020-01-13 NOTE — Progress Notes (Addendum)
[facility]  Patient with abdominal pain and findings of a perforated duodenal ulcer with collection with air fluid level and likely continued communication with the duodenum. This appears contained. Personally reviewed CT.   Patient needs to be NPO, NG in place. IV zosyn and Iv fluconazole to cover for perforation. Protonix 40 BID. Will need bowel rest and UGI / SBFT before starting back diet, and would recommend at least 5-7 days NPO given the anterior collection and communication.  Consider TPN and PICC.  Will need EGD in the future/ outpatient to confirm not cancer.  No hemodynamic instability, not peritoneal per Dr. Wilson Singer.  Patient with h/o EF 25% and would not be a candidate for surgery at [facility].  He could still decompensate and require surgery in the upcoming days.   Admit to hospitalist, and they can decide admission location.  I can come see patient later this afternoon but I am in clinic currently.   Curlene Labrum, MD [facility] [address] [place], [place] [address] [phone] (office)

## 2020-01-13 NOTE — Progress Notes (Signed)
Pharmacy Antibiotic Note  Dennis Yu is a 74 y.o. male admitted on 01/13/2020 with intra abdominal infection.  Pharmacy has been consulted for zosyn dosing.  Plan: Zosyn 3.375g IV q8h (4 hour infusion).  Height: 6' (182.9 cm) Weight: 92.5 kg (204 lb) IBW/kg (Calculated) : 77.6  Temp (24hrs), Avg:98.4 F (36.9 C), Min:98.4 F (36.9 C), Max:98.4 F (36.9 C)  Recent Labs  Lab 01/07/20 0922 01/13/20 0944  WBC  --  15.2*  CREATININE 1.67* 1.55*    Estimated Creatinine Clearance: 47.3 mL/min (A) (by C-G formula based on SCr of 1.55 mg/dL (H)).    No Known Allergies  Antimicrobials this admission: 6/10 zosyn >>  6/10 fluconzole >>   Microbiology results: 6/10 Covid 19: sent   Thank you for allowing pharmacy to be a part of this patient's care.  Donna Christen Stefanee Mckell 01/13/2020 3:13 PM

## 2020-01-13 NOTE — ED Triage Notes (Signed)
Pt sent from Dr. Durene Cal office regarding abd pain for over 1 week. Per Dr. Nevada Crane office, pt reports almost came to ED due to pain on Sunday. Dr. Durene Cal office also states pt had colonoscopy in which on polyp was removed but reports did not report abd pain to GI at that time. Pt denies n/v/d. Pt reports generalized abdominal pain and recent weight loss. nad noted.

## 2020-01-13 NOTE — ED Provider Notes (Signed)
[facility] EMERGENCY DEPARTMENT Provider Note   CSN: [id number] Arrival date & time: 01/13/20  7517     History Chief Complaint  Patient presents with  . Abdominal Pain    Dennis Yu is a 74 y.o. male.  HPI   [name] with abdominal pain. Onset about a week ago. Interestingly, he had a colonoscopy on 01/11/20 but apparently didn't mention it? Pain is somewhat vague. Says it just hurts and isn't very localized. Constant. No appreciable exacerbating or relieving factors. Less of an appetite. No vomiting. No urinary complaints.   Past Medical History:  Diagnosis Date  . Bronchitis 11/2012  . Cardiomyopathy    LVEF 25%  . Diabetes mellitus   . Erectile dysfunction   . Essential hypertension   . GERD (gastroesophageal reflux disease)   . History of colonic polyps   . Mixed hyperlipidemia   . 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 Obstructive sleep apnea   . Patent foramen ovale   . Phlebitis   . 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 Superficial phlebitis 12/10/2012  . Type 2 diabetes mellitus ([place])   . Vitamin D deficiency     Patient Active Problem List   Diagnosis Date Noted  . Diarrhea 11/17/2019  . History of colonic polyps 11/17/2019  . Iron deficiency anemia 01/15/2019  . Encounter for therapeutic drug monitoring 09/02/2013  . Superficial phlebitis 12/10/2012  . 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 Long term (current) use of anticoagulants 05/27/2011  . Embolic stroke ([place]) 74/94/4967  . Patent foramen ovale 05/27/2011  . Coronary atherosclerosis of native coronary artery 05/27/2011  . Mural thrombus of left ventricle 05/27/2011  . DIABETES MELLITUS, TYPE II, CONTROLLED, W/NEURO COMPS 07/01/2006  . Mixed hyperlipidemia 07/01/2006  . Essential hypertension, benign 07/01/2006    Past Surgical History:  Procedure Laterality Date  . NO PAST SURGERIES         Family History  Problem Relation Age of Onset  . Diabetes Father   .  Hypertension Other   . Colon cancer Neg Hx     Social History   Tobacco Use  . Smoking status: Former Smoker    Types: Cigarettes    Start date: 08/06/1967    Quit date: 08/06/2003    Years since quitting: 16.4  . Smokeless tobacco: Never Used  Vaping Use  . Vaping Use: Never used  Substance Use Topics  . Alcohol use: No    Alcohol/week: 0.0 standard drinks  . Drug use: No    Home Medications Prior to Admission medications   Medication Sig Start Date End Date Taking? Authorizing Provider  aspirin 81 MG tablet Take 81 mg by mouth daily.     Yes [provider]  carvedilol (COREG) 12.5 MG tablet TAKE 1 TABLET TWICE A DAY  WITH MEALS Patient taking differently: Take 12.5 mg by mouth 2 (two) times daily with a meal.  08/09/19  Yes Herminio Commons, MD  enoxaparin (LOVENOX) 120 MG/0.8ML injection Inject 0.8 mLs (120 mg total) into the skin daily for 10 days. 01/07/20 01/17/20 Yes Satira Sark, MD  furosemide (LASIX) 40 MG tablet Take 40 mg by mouth daily.   Yes [provider]  gabapentin (NEURONTIN) 300 MG capsule Take 3 capsules (900 mg total) by mouth daily. Patient taking differently: Take 300-600 mg by mouth See admin instructions. 300 mg during the day, 600 mg at bedtime 12/08/13  Yes Kefalas, Manon Hilding, PA-C  glipiZIDE (GLUCOTROL) 5 MG tablet Take 5 mg by mouth daily.  04/11/19  Yes [provider]  NON FORMULARY legatrin pm Daily     Yes [provider]  olmesartan (BENICAR) 20 MG tablet Take 1 tablet (20 mg total) by mouth daily. 07/23/19  Yes Satira Sark, MD  Omega-3 Fatty Acids (FISH OIL) 1000 MG CAPS Take 1,000 mg by mouth daily.    Yes [provider]  PROAIR HFA 108 (90 BASE) MCG/ACT inhaler Inhale 2 puffs into the lungs every 4 (four) hours as needed for wheezing or shortness of breath.  12/02/12  Yes [provider]  simvastatin (ZOCOR) 40 MG tablet Take 40 mg by mouth daily at 6 PM.  02/17/18  Yes [provider]  spironolactone (ALDACTONE) 25 MG tablet Take 0.5 tablets (12.5 mg total) by mouth daily. 07/23/19 01/13/20 Yes Satira Sark, MD  warfarin (JANTOVEN) 5 MG tablet TAKE 1 TABLET DAILY EXCEPT 2 TABLETS ON  FRIDAYS Patient taking differently: Take 5-10 mg by mouth See admin instructions. TAKE 1 (5 mg) TABLET DAILY EXCEPT 2 TABLETS (10 mg)  ON Mondays and FRIDAYS 11/15/19  Yes Satira Sark, MD    Allergies    Patient has no known allergies.  Review of Systems   Review of Systems All systems reviewed and negative, other than as noted in HPI.  Physical Exam Updated Vital Signs BP (!) 138/99   Pulse 77   Temp 98.4 F (36.9 C) (Oral)   Resp 20   Ht 6' (1.829 m)   Wt 92.5 kg   SpO2 97%   BMI 27.67 kg/m   Physical Exam Vitals and nursing note reviewed.  Constitutional:      General: He is not in acute distress.    Appearance: He is well-developed.  HENT:     Head: Normocephalic and atraumatic.  Eyes:     General:        Right eye: No discharge.        Left eye: No discharge.     Conjunctiva/sclera: Conjunctivae normal.  Cardiovascular:     Rate and Rhythm: Normal rate and regular rhythm.     Heart sounds: Normal heart sounds. No murmur heard.  No friction rub. No gallop.   Pulmonary:     Effort: Pulmonary effort is normal. No respiratory distress.     Breath sounds: Normal breath sounds.  Abdominal:     General: There is no distension.     Palpations: Abdomen is soft.     Tenderness: There is abdominal tenderness.     Comments: Mild epigastric tenderness w/o rebound or guarding  Musculoskeletal:        General: No tenderness.     Cervical back: Neck supple.  Skin:    General: Skin is warm and dry.  Neurological:     Mental Status: He is alert.  Psychiatric:        Behavior: Behavior normal.        Thought Content: Thought content normal.     ED Results / Procedures / Treatments   Labs (all labs ordered are listed, but only abnormal  results are displayed) Labs Reviewed  COMPREHENSIVE METABOLIC PANEL - Abnormal; Notable for the following components:      Result Value   Glucose, Bld 169 (*)    BUN 31 (*)    Creatinine, Ser 1.55 (*)    Calcium 10.9 (*)    Total Protein 8.4 (*)    GFR  calc non Af Amer 44 (*)    GFR calc Af Amer 51 (*)    All other components within normal limits  CBC - Abnormal; Notable for the following components:   WBC 15.2 (*)    All other components within normal limits  HEMOGLOBIN A1C - Abnormal; Notable for the following components:   Hgb A1c MFr Bld 7.8 (*)    All other components within normal limits  COMPREHENSIVE METABOLIC PANEL - Abnormal; Notable for the following components:   Glucose, Bld 137 (*)    BUN 33 (*)    Creatinine, Ser 1.73 (*)    GFR calc non Af Amer 39 (*)    GFR calc Af Amer 45 (*)    All other components within normal limits  CBC - Abnormal; Notable for the following components:   WBC 12.5 (*)    RBC 4.06 (*)    Hemoglobin 11.4 (*)    HCT 37.6 (*)    All other components within normal limits  GLUCOSE, CAPILLARY - Abnormal; Notable for the following components:   Glucose-Capillary 106 (*)    All other components within normal limits  GLUCOSE, CAPILLARY - Abnormal; Notable for the following components:   Glucose-Capillary 122 (*)    All other components within normal limits  GLUCOSE, CAPILLARY - Abnormal; Notable for the following components:   Glucose-Capillary 115 (*)    All other components within normal limits  GLUCOSE, CAPILLARY - Abnormal; Notable for the following components:   Glucose-Capillary 117 (*)    All other components within normal limits  GLUCOSE, CAPILLARY - Abnormal; Notable for the following components:   Glucose-Capillary 101 (*)    All other components within normal limits  COMPREHENSIVE METABOLIC PANEL - Abnormal; Notable for the following components:   Glucose, Bld 154 (*)    BUN 28 (*)    Creatinine, Ser 1.47 (*)    GFR calc non Af  Amer 47 (*)    GFR calc Af Amer 54 (*)    All other components within normal limits  CBC - Abnormal; Notable for the following components:   RBC 3.98 (*)    Hemoglobin 11.4 (*)    HCT 36.3 (*)    All other components within normal limits  GLUCOSE, CAPILLARY - Abnormal; Notable for the following components:   Glucose-Capillary 139 (*)    All other components within normal limits  GLUCOSE, CAPILLARY - Abnormal; Notable for the following components:   Glucose-Capillary 135 (*)    All other components within normal limits  GLUCOSE, CAPILLARY - Abnormal; Notable for the following components:   Glucose-Capillary 161 (*)    All other components within normal limits  GLUCOSE, CAPILLARY - Abnormal; Notable for the following components:   Glucose-Capillary 185 (*)    All other components within normal limits  GLUCOSE, CAPILLARY - Abnormal; Notable for the following components:   Glucose-Capillary 138 (*)    All other components within normal limits  CBG MONITORING, ED - Abnormal; Notable for the following components:   Glucose-Capillary 148 (*)    All other components within normal limits  SARS CORONAVIRUS 2 BY RT PCR (HOSPITAL ORDER, [place] [facility])  LIPASE, BLOOD  PROTIME-INR  MAGNESIUM  GLUCOSE, CAPILLARY  PREALBUMIN  MAGNESIUM  PHOSPHORUS  TRIGLYCERIDES  DIFFERENTIAL  URINALYSIS, ROUTINE W REFLEX MICROSCOPIC    EKG EKG Interpretation  Date/Time:  Thursday January 13 2020 09:36:21 EDT Ventricular Rate:  69 PR Interval:    QRS Duration: 103  QT Interval:  387 QTC Calculation: 415 R Axis:   -28 Text Interpretation: Sinus rhythm Multiform ventricular premature complexes Probable inferior infarct, old Anterolateral infarct, age indeterminate Confirmed by Virgel Manifold [phone]) on 01/13/2020 9:39:54 AM   Radiology CT ABDOMEN PELVIS W CONTRAST  Result Date: 01/13/2020 CLINICAL DATA:  Recent colonoscopy.  Mid abdominal pain. EXAM: CT ABDOMEN AND PELVIS WITH  CONTRAST TECHNIQUE: Multidetector CT imaging of the abdomen and pelvis was performed using the standard protocol following bolus administration of intravenous contrast. CONTRAST:  145mL OMNIPAQUE IOHEXOL 300 MG/ML  SOLN COMPARISON:  None. FINDINGS: Lower chest: No acute abnormality. Hepatobiliary: No focal hepatic abnormality. Gallbladder unremarkable. Unusual configuration of the liver with gallbladder noted posteriorly. Pancreas: No focal abnormality or ductal dilatation. Spleen: No focal abnormality.  Normal size. Adrenals/Urinary Tract: No adrenal abnormality. No focal renal abnormality. No stones or hydronephrosis. Urinary bladder is unremarkable. Stomach/Bowel: Marked inflammation noted around the 1st and 2nd portions of the duodenum. There is a fluid collection noted anterior to the proximal descending duodenum which communicates with the duodenal lumen. This most likely reflects changes of severe duodenitis/peptic ulcer disease with a large peptic ulcer. Left colonic diverticulosis. No active diverticulitis. Normal appendix. Stomach and small bowel decompressed, unremarkable. Vascular/Lymphatic: Aortic atherosclerosis. No enlarged abdominal or pelvic lymph nodes. Reproductive: No visible focal abnormality. Other: No free fluid or free air. Musculoskeletal: No acute bony abnormality. IMPRESSION: Marked inflammation and wall thickening involving the proximal duodenum compatible with severe duodenitis/peptic ulcer disease. There is a fluid collection anterior to the proximal duodenum which communicates with the lumen, likely large peptic ulcer. Left colonic diverticulosis.  No active diverticulitis. Electronically Signed   By: Rolm Baptise M.D.   On: 01/13/2020 11:32    Procedures Procedures (including critical care time)  CRITICAL CARE Performed by: Virgel Manifold Total critical care time: 35 minutes Critical care time was exclusive of separately billable procedures and treating other  patients. Critical care was necessary to treat or prevent imminent or life-threatening deterioration. Critical care was time spent personally by me on the following activities: development of treatment plan with patient and/or surrogate as well as nursing, discussions with consultants, evaluation of patient's response to treatment, examination of patient, obtaining history from patient or surrogate, ordering and performing treatments and interventions, ordering and review of laboratory studies, ordering and review of radiographic studies, pulse oximetry and re-evaluation of patient's condition.   Medications Ordered in ED Medications  piperacillin-tazobactam (ZOSYN) IVPB 3.375 g (has no administration in time range)  fluconazole (DIFLUCAN) IVPB 400 mg (has no administration in time range)  iohexol (OMNIPAQUE) 300 MG/ML solution 100 mL (100 mLs Intravenous Contrast Given 01/13/20 1056)  pantoprazole (PROTONIX) injection 40 mg (40 mg Intravenous Given 01/13/20 1152)  HYDROmorphone (DILAUDID) injection 1 mg (1 mg Intravenous Given 01/13/20 1246)    ED Course  I have reviewed the triage vital signs and the nursing notes.  Pertinent labs & imaging results that were available during my care of the patient were reviewed by me and considered in my medical decision making (see chart for details).    MDM Rules/Calculators/A&P                          74ym with abdominal pain. Imaging suggesting severe PUD with contained perforation? Discussed with Dr Constance Haw, general surgery. Recommends NGT, NPO, empiric abx including fungal coverage, PPI. Admission.   Final Clinical Impression(s) / ED Diagnoses Final diagnoses:  Perforated peptic ulcer ([place])  Rx / DC Orders ED Discharge Orders    None       Virgel Manifold, MD 01/15/20 1310

## 2020-01-13 NOTE — H&P (Signed)
History and Physical    Dennis Yu:[id number] DOB: Oct 25, 1945 DOA: 01/13/2020  PCP: [name], MD   Patient coming from: Home  Chief Complaint: Epigastric abdominal pain x1 week.  HPI: Dennis Yu is a 74 y.o. male with medical history significant for ischemic cardiomyopathy with LVEF 25%, type 2 diabetes, hypertension, dyslipidemia, prior CVA, and CKD stage IIIa who presented to the ED with complaints of worsening epigastric abdominal pain that began on Sunday of this week.  He cannot describe the pain.  He had apparently undergone colonoscopy with snare polypectomy on 6/8 with no complications noted.  He did not mention his epigastric abdominal pain to anyone at that time.  He states that he has not been eating anything since Sunday as he was scared to exacerbate his pain.  His wife confirms this and states that if he had had anything, it may have been some applesauce 1 or 2 days ago.  He states that he has not had any bowel movements since his colonoscopy prep.  He denies any fevers or chills.  He denies any exacerbating or alleviating factors.   ED Course: Stable vital signs noted and patient is noted to have leukocytosis of 15,200.  Creatinine 1.55 which is at or near his baseline of 1.3-1.7.  CT of the abdomen and pelvis demonstrates findings of duodenitis and large peptic ulcer with likely contained perforation.  Case was discussed with Dr. Constance Haw of general surgery who has recommended bowel rest with NG tube as well as antibiotics to include Zosyn and Diflucan which have been initiated.  Plans are to also placed on PPI twice daily and consider TPN/PICC line as needed pending further studies.   Review of Systems: Attempted, but difficult to obtain.  Past Medical History:  Diagnosis Date  . Bronchitis 11/2012  . Cardiomyopathy    LVEF 25%  . Diabetes mellitus   . Erectile dysfunction   . Essential hypertension   . GERD (gastroesophageal reflux disease)   . History  of colonic polyps   . Mixed hyperlipidemia   . 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 Obstructive sleep apnea   . Patent foramen ovale   . Phlebitis   . 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 Superficial phlebitis 12/10/2012  . Type 2 diabetes mellitus ([place])   . Vitamin D deficiency     Past Surgical History:  Procedure Laterality Date  . NO PAST SURGERIES       reports that he quit smoking about 16 years ago. His smoking use included cigarettes. He started smoking about 52 years ago. He has never used smokeless tobacco. He reports that he does not drink alcohol and does not use drugs.  No Known Allergies  Family History  Problem Relation Age of Onset  . Diabetes Father   . Hypertension Other   . Colon cancer Neg Hx     Prior to Admission medications   Medication Sig Start Date End Date Taking? Authorizing Provider  aspirin 81 MG tablet Take 81 mg by mouth daily.     Yes [provider]  carvedilol (COREG) 12.5 MG tablet TAKE 1 TABLET TWICE A DAY  WITH MEALS Patient taking differently: Take 12.5 mg by mouth 2 (two) times daily with a meal.  08/09/19  Yes Herminio Commons, MD  enoxaparin (LOVENOX) 120 MG/0.8ML injection Inject 0.8 mLs (120 mg total) into the skin daily for 10 days. 01/07/20 01/17/20 Yes McDowell,  Aloha Gell, MD  furosemide (LASIX) 40 MG tablet Take 40 mg by mouth daily.   Yes [provider]  gabapentin (NEURONTIN) 300 MG capsule Take 3 capsules (900 mg total) by mouth daily. Patient taking differently: Take 300-600 mg by mouth See admin instructions. 300 mg during the day, 600 mg at bedtime 12/08/13  Yes Kefalas, Manon Hilding, PA-C  glipiZIDE (GLUCOTROL) 5 MG tablet Take 5 mg by mouth daily.  04/11/19  Yes [provider]  NON FORMULARY legatrin pm Daily     Yes [provider]  olmesartan (BENICAR) 20 MG tablet Take 1 tablet (20 mg total) by mouth daily. 07/23/19  Yes Satira Sark, MD  Omega-3 Fatty Acids (FISH  OIL) 1000 MG CAPS Take 1,000 mg by mouth daily.    Yes [provider]  PROAIR HFA 108 (90 BASE) MCG/ACT inhaler Inhale 2 puffs into the lungs every 4 (four) hours as needed for wheezing or shortness of breath.  12/02/12  Yes [provider]  simvastatin (ZOCOR) 40 MG tablet Take 40 mg by mouth daily at 6 PM.  02/17/18  Yes [provider]  spironolactone (ALDACTONE) 25 MG tablet Take 0.5 tablets (12.5 mg total) by mouth daily. 07/23/19 01/13/20 Yes Satira Sark, MD  warfarin (JANTOVEN) 5 MG tablet TAKE 1 TABLET DAILY EXCEPT 2 TABLETS ON  FRIDAYS Patient taking differently: Take 5-10 mg by mouth See admin instructions. TAKE 1 (5 mg) TABLET DAILY EXCEPT 2 TABLETS (10 mg)  ON Mondays and FRIDAYS 11/15/19  Yes Satira Sark, MD    Physical Exam: Vitals:   01/13/20 1300 01/13/20 1330 01/13/20 1335 01/13/20 1400  BP: 121/82 (!) 131/99 116/76 114/73  Pulse: 72 100    Resp: 16 15 17 17   Temp:      TempSrc:      SpO2: 92% 93%    Weight:      Height:        Constitutional: NAD, calm, comfortable Vitals:   01/13/20 1300 01/13/20 1330 01/13/20 1335 01/13/20 1400  BP: 121/82 (!) 131/99 116/76 114/73  Pulse: 72 100    Resp: 16 15 17 17   Temp:      TempSrc:      SpO2: 92% 93%    Weight:      Height:       Eyes: lids and conjunctivae normal ENMT: Mucous membranes are moist.  Neck: normal, supple Respiratory: clear to auscultation bilaterally. Normal respiratory effort. No accessory muscle use.  Cardiovascular: Regular rate and rhythm, no murmurs. No extremity edema. Abdomen: no tenderness, no distention. Bowel sounds positive.  Musculoskeletal:  No joint deformity upper and lower extremities.   Skin: no rashes, lesions, ulcers.  Psychiatric: Normal judgment and insight. Alert and oriented x 3. Normal mood.   Labs on Admission: I have personally reviewed following labs and imaging studies  CBC: Recent Labs  Lab 01/13/20 0944  WBC 15.2*  HGB 13.3    HCT 42.4  MCV 90.8  PLT 413   Basic Metabolic Panel: Recent Labs  Lab 01/07/20 0922 01/13/20 0944  NA 136 138  K 4.8 4.5  CL 102 101  CO2 25 27  GLUCOSE 154* 169*  BUN 34* 31*  CREATININE 1.67* 1.55*  CALCIUM 10.3 10.9*   GFR: Estimated Creatinine Clearance: 47.3 mL/min (A) (by C-G formula based on SCr of 1.55 mg/dL (H)). Liver Function Tests: Recent Labs  Lab 01/13/20 0944  AST 26  ALT 41  ALKPHOS 60  BILITOT  0.7  PROT 8.4*  ALBUMIN 4.2   Recent Labs  Lab 01/13/20 0944  LIPASE 30   No results for input(s): AMMONIA in the last 168 hours. Coagulation Profile: Recent Labs  Lab 01/13/20 0944  INR 1.2   Cardiac Enzymes: No results for input(s): CKTOTAL, CKMB, CKMBINDEX, TROPONINI in the last 168 hours. BNP (last 3 results) No results for input(s): PROBNP in the last 8760 hours. HbA1C: No results for input(s): HGBA1C in the last 72 hours. CBG: Recent Labs  Lab 01/11/20 0854  GLUCAP 160*   Lipid Profile: No results for input(s): CHOL, HDL, LDLCALC, TRIG, CHOLHDL, LDLDIRECT in the last 72 hours. Thyroid Function Tests: No results for input(s): TSH, T4TOTAL, FREET4, T3FREE, THYROIDAB in the last 72 hours. Anemia Panel: No results for input(s): VITAMINB12, FOLATE, FERRITIN, TIBC, IRON, RETICCTPCT in the last 72 hours. Urine analysis:    Component Value Date/Time   COLORURINE YELLOW 04/06/2011 [place] 04/06/2011 0454   LABSPEC 1.022 04/06/2011 0454   PHURINE 6.0 04/06/2011 0454   GLUCOSEU NEGATIVE 04/06/2011 0454   HGBUR NEGATIVE 04/06/2011 0454   BILIRUBINUR NEGATIVE 04/06/2011 0454   KETONESUR NEGATIVE 04/06/2011 0454   PROTEINUR NEGATIVE 04/06/2011 0454   UROBILINOGEN 0.2 04/06/2011 0454   NITRITE NEGATIVE 04/06/2011 0454   LEUKOCYTESUR NEGATIVE 04/06/2011 0454    Radiological Exams on Admission: CT ABDOMEN PELVIS W CONTRAST  Result Date: 01/13/2020 CLINICAL DATA:  Recent colonoscopy.  Mid abdominal pain. EXAM: CT ABDOMEN  AND PELVIS WITH CONTRAST TECHNIQUE: Multidetector CT imaging of the abdomen and pelvis was performed using the standard protocol following bolus administration of intravenous contrast. CONTRAST:  14mL OMNIPAQUE IOHEXOL 300 MG/ML  SOLN COMPARISON:  None. FINDINGS: Lower chest: No acute abnormality. Hepatobiliary: No focal hepatic abnormality. Gallbladder unremarkable. Unusual configuration of the liver with gallbladder noted posteriorly. Pancreas: No focal abnormality or ductal dilatation. Spleen: No focal abnormality.  Normal size. Adrenals/Urinary Tract: No adrenal abnormality. No focal renal abnormality. No stones or hydronephrosis. Urinary bladder is unremarkable. Stomach/Bowel: Marked inflammation noted around the 1st and 2nd portions of the duodenum. There is a fluid collection noted anterior to the proximal descending duodenum which communicates with the duodenal lumen. This most likely reflects changes of severe duodenitis/peptic ulcer disease with a large peptic ulcer. Left colonic diverticulosis. No active diverticulitis. Normal appendix. Stomach and small bowel decompressed, unremarkable. Vascular/Lymphatic: Aortic atherosclerosis. No enlarged abdominal or pelvic lymph nodes. Reproductive: No visible focal abnormality. Other: No free fluid or free air. Musculoskeletal: No acute bony abnormality. IMPRESSION: Marked inflammation and wall thickening involving the proximal duodenum compatible with severe duodenitis/peptic ulcer disease. There is a fluid collection anterior to the proximal duodenum which communicates with the lumen, likely large peptic ulcer. Left colonic diverticulosis.  No active diverticulitis. Electronically Signed   By: Rolm Baptise M.D.   On: 01/13/2020 11:32    EKG: Independently reviewed.  Sinus rhythm 69 bpm with PVCs.  Assessment/Plan Active Problems:   Perforated ulcer (HCC)    Epigastric abdominal pain likely secondary to perforated duodenal ulcer with contained fluid  collection -Plan to keep n.p.o. and place NG tube per general surgery recommendations -IV PPI twice daily -Bowel rest, IV fluid with LR -Continue on IV Zosyn and fluconazole to cover for perforation -Plan for upper GI/SBFT study per general surgery by tomorrow for further evaluation -We will consider TPN/PICC placement if patient is noted to have significant findings on upcoming imaging studies that would require prolonged bowel rest -Patient was considered for transfer  to [facility] given the severity of his cardiomyopathy.  He would not be a candidate for surgery should he require this at [facility].  I spoke with Dr. Rosendo Gros who is on-call for [facility] surgery and he stated that patient should be okay to stay at [facility] for now.  History of ischemic cardiomyopathy with LVEF 25% -Follows with Dr. Domenic Polite in outpatient setting -Hold outpatient medications of aspirin, Coreg, Benicar, Aldactone, and Lasix until he can tolerate orals   CKD stage IIIa -Baseline creatinine approximately 1.3-1.7 -Monitor repeat labs -Monitor I's and O's and keep on IV fluid  Chronic iron deficiency anemia -Does not appear to be on iron supplementation -No overt bleeding identified, follow CBC  Mixed hyperlipidemia -Hold Zocor  History of CVA -Holding aspirin and statin as well as Coumadin  History of colonic polyps -Status post colonoscopy with snare polypectomy on 01/11/2020 with Dr. Gala Romney  History of hypertension -Holding home blood pressure agents for now -IV labetalol as needed for severe elevations  History of type 2 diabetes -Mild hyperglycemia currently noted -Hold home glipizide -Maintain on SSI every 4 hours while n.p.o.   DVT prophylaxis: SCDs/hold home Coumadin Code Status: Full Family Communication: Discussed with wife, [place] Liz on phone Disposition Plan: Admit for evaluation and management of perforated ulcer Consults called: General surgery-Dr. Constance Haw Admission  status: Inpatient, telemetry Status is: Inpatient  Remains inpatient appropriate because:Ongoing diagnostic testing needed not appropriate for outpatient work up, IV treatments appropriate due to intensity of illness or inability to take PO and Inpatient level of care appropriate due to severity of illness   Dispo: The patient is from: Home              Anticipated d/c is to: Home              Anticipated d/c date is: 3 days              Patient currently is not medically stable to d/c.   Bryana Froemming D Manuella Ghazi DO [facility]  If 7PM-7AM, please contact night-coverage [email or web address]  01/13/2020, 2:50 PM

## 2020-01-14 ENCOUNTER — Inpatient Hospital Stay: Payer: Self-pay

## 2020-01-14 LAB — CBC
HCT: 37.6 % — ABNORMAL LOW (ref 39.0–52.0)
Hemoglobin: 11.4 g/dL — ABNORMAL LOW (ref 13.0–17.0)
MCH: 28.1 pg (ref 26.0–34.0)
MCHC: 30.3 g/dL (ref 30.0–36.0)
MCV: 92.6 fL (ref 80.0–100.0)
Platelets: 266 10*3/uL (ref 150–400)
RBC: 4.06 MIL/uL — ABNORMAL LOW (ref 4.22–5.81)
RDW: 12.1 % (ref 11.5–15.5)
WBC: 12.5 10*3/uL — ABNORMAL HIGH (ref 4.0–10.5)
nRBC: 0 % (ref 0.0–0.2)

## 2020-01-14 LAB — COMPREHENSIVE METABOLIC PANEL
ALT: 26 U/L (ref 0–44)
AST: 16 U/L (ref 15–41)
Albumin: 3.5 g/dL (ref 3.5–5.0)
Alkaline Phosphatase: 49 U/L (ref 38–126)
Anion gap: 8 (ref 5–15)
BUN: 33 mg/dL — ABNORMAL HIGH (ref 8–23)
CO2: 26 mmol/L (ref 22–32)
Calcium: 9.3 mg/dL (ref 8.9–10.3)
Chloride: 103 mmol/L (ref 98–111)
Creatinine, Ser: 1.73 mg/dL — ABNORMAL HIGH (ref 0.61–1.24)
GFR calc Af Amer: 45 mL/min — ABNORMAL LOW (ref >60)
GFR calc non Af Amer: 39 mL/min — ABNORMAL LOW (ref >60)
Glucose, Bld: 137 mg/dL — ABNORMAL HIGH (ref 70–99)
Potassium: 4.5 mmol/L (ref 3.5–5.1)
Sodium: 137 mmol/L (ref 135–145)
Total Bilirubin: 0.6 mg/dL (ref 0.3–1.2)
Total Protein: 6.9 g/dL (ref 6.5–8.1)

## 2020-01-14 LAB — GLUCOSE, CAPILLARY
Glucose-Capillary: 101 mg/dL — ABNORMAL HIGH (ref 70–99)
Glucose-Capillary: 115 mg/dL — ABNORMAL HIGH (ref 70–99)
Glucose-Capillary: 117 mg/dL — ABNORMAL HIGH (ref 70–99)
Glucose-Capillary: 122 mg/dL — ABNORMAL HIGH (ref 70–99)
Glucose-Capillary: 139 mg/dL — ABNORMAL HIGH (ref 70–99)
Glucose-Capillary: 81 mg/dL (ref 70–99)

## 2020-01-14 LAB — MAGNESIUM: Magnesium: 2 mg/dL (ref 1.7–2.4)

## 2020-01-14 MED ORDER — LACTATED RINGERS IV SOLN: INTRAVENOUS | Status: AC

## 2020-01-14 MED ORDER — SODIUM CHLORIDE 0.9% FLUSH
10.0000 mL | Freq: Two times a day (BID) | INTRAVENOUS | Status: DC
  Administered 2020-01-14 – 2020-01-21 (×14): 10 mL

## 2020-01-14 MED ORDER — TRAVASOL 10 % IV SOLN: INTRAVENOUS | Status: DC

## 2020-01-14 MED ORDER — SODIUM CHLORIDE 0.9% FLUSH: 10.0000 mL | INTRAVENOUS | Status: DC | PRN

## 2020-01-14 MED ORDER — TRAVASOL 10 % IV SOLN
INTRAVENOUS | Status: AC
  Filled 2020-01-14: qty 384

## 2020-01-14 MED ORDER — CHLORHEXIDINE GLUCONATE CLOTH 2 % EX PADS
6.0000 | MEDICATED_PAD | Freq: Every day | CUTANEOUS | Status: DC
  Administered 2020-01-14 – 2020-01-21 (×7): 6 via TOPICAL

## 2020-01-14 NOTE — Progress Notes (Addendum)
PHARMACY - TOTAL PARENTERAL NUTRITION CONSULT NOTE   Indication: perforated bowel  Patient Measurements: Height: 6' (182.9 cm) Weight: 84.8 kg (186 lb 15.2 oz) IBW/kg (Calculated) : 77.6 TPN AdjBW (KG): 84.5 Body mass index is 25.35 kg/m.  Assessment:  74 yo male with hx of CKD-3, CVA, ischemic cardiomyopathy (EF-25%). He presents with abdominal pain. Surgery consulted. Bowel rest planned and pt unable to tolerate placement of NGT. CT-duodenitis and large peptic ulcer with perforation. Plan to start TPN  Glucose / Insulin: 115-160 Electrolytes: WNL range  Renal: Scr 1.73 LFTs / TGs: WNL Prealbumin / albumin: 3.5 Intake / Output; MIVF: 1085/600 GI Imaging:CT-duodenitis and large peptic ulcer with perforation  Surgeries / Procedures:   Central access: PICC TPN start date: 01/14/2020  Nutritional Goals (per RD recommendation on 6/21): kCal: 2125-2295, Protein: 68-75  Fluid: > 2 L/day Goal TPN rate is 80 mL/hr (provides 76.8 g of protein and 2188 kcals per day)  Current Nutrition:  NPO  Plan:  Start TPN at 63mL/hr at 1800 Electrolytes in TPN: 53mEq/L of Na, 53mEq/L of K, 66mEq/L of Ca, 51mEq/L of Mg, and 39mmol/L of Phos. Cl:Ac ratio 1:1. Current TPN providing Prot 38g , CHO 192 g ( 652 kcal), lipids 288 kcal. Total kcal 1094 kcal. Add standard MVI and trace elements to TPN Initiate Sensitive q4h SSI and adjust as needed  Reduce MIVF to 30 mL/hr  Monitor TPN labs on Mon/Thurs  Isac Sarna, BS Vena Austria, BCPS Clinical Pharmacist Pager [phone]  01/14/2020,11:53 AM

## 2020-01-14 NOTE — Progress Notes (Signed)
Peripherally Inserted Central Catheter Placement  The IV Nurse has discussed with the patient and/or persons authorized to consent for the patient, the purpose of this procedure and the potential benefits and risks involved with this procedure.  The benefits include less needle sticks, lab draws from the catheter, and the patient may be discharged home with the catheter. Risks include, but not limited to, infection, bleeding, blood clot (thrombus formation), and puncture of an artery; nerve damage and irregular heartbeat and possibility to perform a PICC exchange if needed/ordered by physician.  Alternatives to this procedure were also discussed.  Bard Power PICC patient education guide, fact sheet on infection prevention and patient information card has been provided to patient /or left at bedside.    PICC Placement Documentation  PICC Double Lumen 02/13/18 PICC Right Basilic 46 cm 2 cm (Active)  Indication for Insertion or Continuance of Line Administration of hyperosmolar/irritating solutions (i.e. TPN, Vancomycin, etc.) 01/14/20 1322  Exposed Catheter (cm) 2 cm 01/14/20 1322  Site Assessment Clean;Dry;Intact 01/14/20 1322  Lumen #1 Status Blood return noted;Flushed;Saline locked 01/14/20 1322  Lumen #2 Status Blood return noted;Flushed;Saline locked 01/14/20 1322  Dressing Type Securing device 01/14/20 1322  Dressing Status Clean;Dry;Intact;Antimicrobial disc in place 01/14/20 1322  Safety Lock Not Applicable 75/88/32 5498  Line Care Connections checked and tightened 01/14/20 1322  Dressing Change Due 01/21/20 01/14/20 [place], Lorrena Goranson M 01/14/2020, 1:24 PM

## 2020-01-14 NOTE — Progress Notes (Signed)
Initial Nutrition Assessment  DOCUMENTATION CODES:      INTERVENTION:  TPN per pharmacy   NUTRITION DIAGNOSIS:   Inadequate oral intake related to inability to eat, acute illness (abdominal pain/ duodenitis - requiring bowel rest) as evidenced by NPO status (surgical consult- pending).  GOAL:  Patient will meet greater than or equal to 90% of their needs    MONITOR:   I & O's, Weight trends, Diet advancement REASON FOR ASSESSMENT:   Consult New TPN/TNA  ASSESSMENT:  Patient is a 74 yo male with hx of CKD-3, CVA, ischemic cardiomyopathy (EF-25%). He presents with abdominal pain. Surgery consulted. Bowel rest planned and pt unable to tolerate placement of NGT. TNA pending.   CT-duodenitis and large peptic ulcer with perforation  Minimal po the past 5 days per pt: bites of applesauce and sugar-free beverages. He is not complaining this morning of pain.   Weight history: Patient reports usual weight around 200-204 lb. Bed weight this morning 84.8 kg (186.5 lb). Weight change/ loss of ~ 15 lbs/ 8% based on hospital records.      Intake/Output Summary (Last 24 hours) at 01/14/2020 1115 Last data filed at 01/14/2020 1000 Gross per 24 hour  Intake 1085.36 ml  Output 600 ml  Net 485.36 ml    Medications reviewed: insulin (0-9 units q 4hr), Zosyn  Labs: BUN 33 (H), Cr 1.73 (H), Glu 137 (H)    NUTRITION - FOCUSED PHYSICAL EXAM:  Unable to complete Nutrition-Focused physical exam at this time.     Diet Order:   Diet Order            Diet NPO time specified  Diet effective now                 EDUCATION NEEDS:   Not appropriate for education at this time Skin:  Skin Assessment: Reviewed RN Assessment  Last BM:  6/7 abdomen soft per nursing  Height:   Ht Readings from Last 1 Encounters:  01/13/20 6' (1.829 m)    Weight:   Wt Readings from Last 1 Encounters:  01/14/20 84.8 kg    Ideal Body Weight:   81 kg  BMI:  Body mass index is 25.35  kg/m.  Estimated Nutritional Needs:   Kcal:  2125-2295  Protein:  68-75 gr  Fluid:  >2 liters daily   Colman Cater MS,RD,CSG,LDN Pager: available through [facility]

## 2020-01-14 NOTE — Plan of Care (Signed)

## 2020-01-14 NOTE — Progress Notes (Signed)
[facility] Progress Note     Subjective: Doing fair. No abdominal pain or nausea.vomiting reported. Reviewed CT with radiology this AM and they recommended holding on the UGI until next Tuesday / 5 days from presentation given the large continuity with the cavity and the lumen.   Recommended PICC and TPN for nutrition given the duration of NPO.   Objective: Vital signs in last 24 hours: Temp:  [98.2 F (36.8 C)-98.6 F (37 C)] 98.5 F (36.9 C) (06/11 1346) Pulse Rate:  [64-75] 64 (06/11 1346) Resp:  [15-16] 15 (06/11 1346) BP: (109-120)/(76-79) 111/76 (06/11 1346) SpO2:  [92 %-98 %] 98 % (06/11 1346) Weight:  [84.8 kg] 84.8 kg (06/11 1000) Last BM Date: 01/10/20  Intake/Output from previous day: 06/10 0701 - 06/11 0700 In: 1085.4 [I.V.:812.5; IV Piggyback:272.9] Out: 300 [Urine:300] Intake/Output this shift: No intake/output data recorded.  General appearance: alert, cooperative and no distress Resp: normal work of breathing GI: soft, mildly distended, mildly tender epigastric region  Lab Results:  Recent Labs    01/13/20 0944 01/14/20 0559  WBC 15.2* 12.5*  HGB 13.3 11.4*  HCT 42.4 37.6*  PLT 312 266   BMET Recent Labs    01/13/20 0944 01/14/20 0559  NA 138 137  K 4.5 4.5  CL 101 103  CO2 27 26  GLUCOSE 169* 137*  BUN 31* 33*  CREATININE 1.55* 1.73*  CALCIUM 10.9* 9.3   PT/INR Recent Labs    01/13/20 0944  LABPROT 14.5  INR 1.2    Studies/Results: CT ABDOMEN PELVIS W CONTRAST  Result Date: 01/13/2020 CLINICAL DATA:  Recent colonoscopy.  Mid abdominal pain. EXAM: CT ABDOMEN AND PELVIS WITH CONTRAST TECHNIQUE: Multidetector CT imaging of the abdomen and pelvis was performed using the standard protocol following bolus administration of intravenous contrast. CONTRAST:  112mL OMNIPAQUE IOHEXOL 300 MG/ML  SOLN COMPARISON:  None. FINDINGS: Lower chest: No acute abnormality. Hepatobiliary: No focal hepatic abnormality. Gallbladder  unremarkable. Unusual configuration of the liver with gallbladder noted posteriorly. Pancreas: No focal abnormality or ductal dilatation. Spleen: No focal abnormality.  Normal size. Adrenals/Urinary Tract: No adrenal abnormality. No focal renal abnormality. No stones or hydronephrosis. Urinary bladder is unremarkable. Stomach/Bowel: Marked inflammation noted around the 1st and 2nd portions of the duodenum. There is a fluid collection noted anterior to the proximal descending duodenum which communicates with the duodenal lumen. This most likely reflects changes of severe duodenitis/peptic ulcer disease with a large peptic ulcer. Left colonic diverticulosis. No active diverticulitis. Normal appendix. Stomach and small bowel decompressed, unremarkable. Vascular/Lymphatic: Aortic atherosclerosis. No enlarged abdominal or pelvic lymph nodes. Reproductive: No visible focal abnormality. Other: No free fluid or free air. Musculoskeletal: No acute bony abnormality. IMPRESSION: Marked inflammation and wall thickening involving the proximal duodenum compatible with severe duodenitis/peptic ulcer disease. There is a fluid collection anterior to the proximal duodenum which communicates with the lumen, likely large peptic ulcer. Left colonic diverticulosis.  No active diverticulitis. Electronically Signed   By: Rolm Baptise M.D.   On: 01/13/2020 11:32   [place] EKG SITE RITE  Result Date: 01/14/2020 If Site Rite image not attached, placement could not be confirmed due to current cardiac rhythm.   Anti-infectives: Anti-infectives (From admission, onward)   Start     Dose/Rate Route Frequency Ordered Stop   01/13/20 2200  piperacillin-tazobactam (ZOSYN) IVPB 3.375 g     Discontinue     3.375 g 12.5 mL/hr over 240 Minutes Intravenous Every 8 hours 01/13/20 1513  01/13/20 1330  piperacillin-tazobactam (ZOSYN) IVPB 3.375 g        3.375 g 100 mL/hr over 30 Minutes Intravenous  Once 01/13/20 1315 01/13/20 1420   01/13/20  1330  fluconazole (DIFLUCAN) IVPB 400 mg     Discontinue     400 mg 100 mL/hr over 120 Minutes Intravenous Every 24 hours 01/13/20 1326        Assessment/Plan: Mr. Kotowski is a 74 yo with a perforated contained duodenal ulcer. Doing well overall. PRN for pain NPO TPN UGI on Tuesday with water soluble contrast to assess if we can feed  IV zosyn and fluconazole for intraabdominal perforation  Will need outpatient EGD in 6-8 weeks   Discussed with Dr. Manuella Ghazi.    LOS: 1 day    Virl Cagey 01/14/2020

## 2020-01-14 NOTE — Care Management Important Message (Signed)
Important Message  Patient Details  Name: Dennis Yu MRN: [id number] Date of Birth: 1946-06-20   Medicare Important Message Given:  Yes     Tommy Medal 01/14/2020, 1:40 PM

## 2020-01-14 NOTE — Progress Notes (Signed)
PROGRESS NOTE    Dennis Yu  QQI:[id number] DOB: Oct 28, 1945 DOA: 01/13/2020 PCP: [name], MD   Brief Narrative:  Per HPI: Dennis Yu is a 74 y.o. male with medical history significant for ischemic cardiomyopathy with LVEF 25%, type 2 diabetes, hypertension, dyslipidemia, prior CVA, and CKD stage IIIa who presented to the ED with complaints of worsening epigastric abdominal pain that began on Sunday of this week.  He cannot describe the pain.  He had apparently undergone colonoscopy with snare polypectomy on 6/8 with no complications noted.  He did not mention his epigastric abdominal pain to anyone at that time.  He states that he has not been eating anything since Sunday as he was scared to exacerbate his pain.  His wife confirms this and states that if he had had anything, it may have been some applesauce 1 or 2 days ago.  He states that he has not had any bowel movements since his colonoscopy prep.  He denies any fevers or chills.  He denies any exacerbating or alleviating factors.  -Plans to place PICC line and start TPN per general surgery recommendations after further review of imaging with radiology.  Continue IV Zosyn and fluconazole empirically.  Assessment & Plan:   Active Problems:   Perforated peptic ulcer ([place])   Epigastric abdominal pain likely secondary to perforated duodenal ulcer with contained fluid collection -Plan to keep n.p.o.  -NG tube could not be placed -IV PPI twice daily -Bowel rest, IV fluid today as patient should receive TPN -Continue on IV Zosyn and fluconazole to cover for perforation -Plan for upper GI/SBFT study per general surgery by tomorrow for further evaluation -Plans to start TPN/PICC line per general surgery recommendations today  History of ischemic cardiomyopathy with LVEF 25% -Follows with Dr. Domenic Polite in outpatient setting -Hold outpatient medications of aspirin, Coreg, Benicar, Aldactone, and Lasix until he can tolerate  orals   CKD stage IIIa -Baseline creatinine approximately 1.3-1.7 -Monitor repeat labs -Monitor I's and O's and will start TPN shortly  Chronic iron deficiency anemia-downtrending -Does not appear to be on iron supplementation -No overt bleeding identified, follow CBC  Mixed hyperlipidemia -Hold Zocor  History of CVA -Holding aspirin and statin as well as Coumadin  History of colonic polyps -Status post colonoscopy with snare polypectomy on 01/11/2020 with Dr. Gala Romney  History of hypertension-stable -Holding home blood pressure agents for now -IV labetalol as needed for severe elevations  History of type 2 diabetes -Mild hyperglycemia currently noted -Hemoglobin A1c 7.8% -Hold home glipizide -Maintain on SSI every 4 hours while n.p.o.   DVT prophylaxis:SCDs Code Status: Full code Family Communication: Discussed with wife at bedside 6/11 Disposition Plan: Patient to remain on bowel rest with TPN/PICC line placement today per general surgery. Status is: Inpatient  Remains inpatient appropriate because:Ongoing diagnostic testing needed not appropriate for outpatient work up and IV treatments appropriate due to intensity of illness or inability to take PO   Dispo: The patient is from: Home              Anticipated d/c is to: Home              Anticipated d/c date is: > 3 days              Patient currently is not medically stable to d/c.  Consultants:   General surgery  Procedures:   As noted below  Antimicrobials:  Anti-infectives (From admission, onward)   Start  Dose/Rate Route Frequency Ordered Stop   01/13/20 2200  piperacillin-tazobactam (ZOSYN) IVPB 3.375 g     Discontinue     3.375 g 12.5 mL/hr over 240 Minutes Intravenous Every 8 hours 01/13/20 1513     01/13/20 1330  piperacillin-tazobactam (ZOSYN) IVPB 3.375 g        3.375 g 100 mL/hr over 30 Minutes Intravenous  Once 01/13/20 1315 01/13/20 1420   01/13/20 1330  fluconazole (DIFLUCAN) IVPB  400 mg     Discontinue     400 mg 100 mL/hr over 120 Minutes Intravenous Every 24 hours 01/13/20 1326         Subjective: Patient seen and evaluated today with no new acute complaints or concerns. No acute concerns or events noted overnight.  He denies any abdominal pain.  No nausea or vomiting overnight and no bowel movements.  Objective: Vitals:   01/13/20 2034 01/14/20 0012 01/14/20 0410 01/14/20 1000  BP: 116/78 120/79 109/79   Pulse: 69 68 75   Resp: 16 16 16    Temp: 98.6 F (37 C) 98.2 F (36.8 C) 98.2 F (36.8 C)   TempSrc: Oral Oral Oral   SpO2: 97% 92% 95%   Weight:    84.8 kg  Height:        Intake/Output Summary (Last 24 hours) at 01/14/2020 1045 Last data filed at 01/14/2020 1000 Gross per 24 hour  Intake 1085.36 ml  Output 600 ml  Net 485.36 ml   Filed Weights   01/13/20 0932 01/13/20 1648 01/14/20 1000  Weight: 92.5 kg 84.5 kg 84.8 kg    Examination:  General exam: Appears calm and comfortable  Respiratory system: Clear to auscultation. Respiratory effort normal. Cardiovascular system: S1 & S2 heard, RRR. No JVD, murmurs, rubs, gallops or clicks. No pedal edema. Gastrointestinal system: Abdomen is nondistended, soft and nontender. No organomegaly or masses felt. Normal bowel sounds heard. Central nervous system: Alert and oriented. No focal neurological deficits. Extremities: Symmetric 5 x 5 power. Skin: No rashes, lesions or ulcers Psychiatry: Judgement and insight appear normal. Mood & affect appropriate.     Data Reviewed: I have personally reviewed following labs and imaging studies  CBC: Recent Labs  Lab 01/13/20 0944 01/14/20 0559  WBC 15.2* 12.5*  HGB 13.3 11.4*  HCT 42.4 37.6*  MCV 90.8 92.6  PLT 312 924   Basic Metabolic Panel: Recent Labs  Lab 01/13/20 0944 01/14/20 0559  NA 138 137  K 4.5 4.5  CL 101 103  CO2 27 26  GLUCOSE 169* 137*  BUN 31* 33*  CREATININE 1.55* 1.73*  CALCIUM 10.9* 9.3  MG  --  2.0    GFR: Estimated Creatinine Clearance: 42.4 mL/min (A) (by C-G formula based on SCr of 1.73 mg/dL (H)). Liver Function Tests: Recent Labs  Lab 01/13/20 0944 01/14/20 0559  AST 26 16  ALT 41 26  ALKPHOS 60 49  BILITOT 0.7 0.6  PROT 8.4* 6.9  ALBUMIN 4.2 3.5   Recent Labs  Lab 01/13/20 0944  LIPASE 30   No results for input(s): AMMONIA in the last 168 hours. Coagulation Profile: Recent Labs  Lab 01/13/20 0944  INR 1.2   Cardiac Enzymes: No results for input(s): CKTOTAL, CKMB, CKMBINDEX, TROPONINI in the last 168 hours. BNP (last 3 results) No results for input(s): PROBNP in the last 8760 hours. HbA1C: Recent Labs    01/13/20 0944  HGBA1C 7.8*   CBG: Recent Labs  Lab 01/13/20 1615 01/13/20 2014 01/14/20 0010 01/14/20 0407 01/14/20  0728  GLUCAP 148* 106* 122* 115* 117*   Lipid Profile: No results for input(s): CHOL, HDL, LDLCALC, TRIG, CHOLHDL, LDLDIRECT in the last 72 hours. Thyroid Function Tests: No results for input(s): TSH, T4TOTAL, FREET4, T3FREE, THYROIDAB in the last 72 hours. Anemia Panel: No results for input(s): VITAMINB12, FOLATE, FERRITIN, TIBC, IRON, RETICCTPCT in the last 72 hours. Sepsis Labs: No results for input(s): PROCALCITON, LATICACIDVEN in the last 168 hours.  Recent Results (from the past 240 hour(s))  SARS CORONAVIRUS 2 (TAT 6-24 HRS)     Status: None   Collection Time: 01/07/20 11:58 AM  Result Value Ref Range Status   SARS Coronavirus 2 NEGATIVE NEGATIVE Final    Comment: (NOTE) SARS-CoV-2 target nucleic acids are NOT DETECTED. The SARS-CoV-2 RNA is generally detectable in upper and lower respiratory specimens during the acute phase of infection. Negative results do not preclude SARS-CoV-2 infection, do not rule out co-infections with other pathogens, and should not be used as the sole basis for treatment or other patient management decisions. Negative results must be combined with clinical observations, patient history, and  epidemiological information. The expected result is Negative. Fact Sheet for Patients: SugarRoll.be Fact Sheet for Healthcare Providers: https://www.woods-mathews.com/ This test is not yet approved or cleared by the [place] FDA and  has been authorized for detection and/or diagnosis of SARS-CoV-2 by FDA under an Emergency Use Authorization (EUA). This EUA will remain  in effect (meaning this test can be used) for the duration of the COVID-19 declaration under Section 56 4(b)(1) of the Act, 21 U.S.C. section 360bbb-3(b)(1), unless the authorization is terminated or revoked sooner. Performed at [facility], [place] [address] [place], [place] [address]   SARS Coronavirus 2 by RT PCR (hospital order, performed in [facility] hospital lab) Nasopharyngeal Nasopharyngeal Swab     Status: None   Collection Time: 01/13/20  2:39 PM   Specimen: Nasopharyngeal Swab  Result Value Ref Range Status   SARS Coronavirus 2 NEGATIVE NEGATIVE Final    Comment: (NOTE) SARS-CoV-2 target nucleic acids are NOT DETECTED.  The SARS-CoV-2 RNA is generally detectable in upper and lower respiratory specimens during the acute phase of infection. The lowest concentration of SARS-CoV-2 viral copies this assay can detect is 250 copies / mL. A negative result does not preclude SARS-CoV-2 infection and should not be used as the sole basis for treatment or other patient management decisions.  A negative result may occur with improper specimen collection / handling, submission of specimen other than nasopharyngeal swab, presence of viral mutation(s) within the areas targeted by this assay, and inadequate number of viral copies (<250 copies / mL). A negative result must be combined with clinical observations, patient history, and epidemiological information.  Fact Sheet for Patients:   StrictlyIdeas.no  Fact Sheet for Healthcare  Providers: BankingDealers.co.za  This test is not yet approved or  cleared by the [place] FDA and has been authorized for detection and/or diagnosis of SARS-CoV-2 by FDA under an Emergency Use Authorization (EUA).  This EUA will remain in effect (meaning this test can be used) for the duration of the COVID-19 declaration under Section 564(b)(1) of the Act, 21 U.S.C. section 360bbb-3(b)(1), unless the authorization is terminated or revoked sooner.  Performed at [facility], [address] [place],  [address]          Radiology Studies: CT ABDOMEN PELVIS W CONTRAST  Result Date: 01/13/2020 CLINICAL DATA:  Recent colonoscopy.  Mid abdominal pain. EXAM: CT ABDOMEN AND  PELVIS WITH CONTRAST TECHNIQUE: Multidetector CT imaging of the abdomen and pelvis was performed using the standard protocol following bolus administration of intravenous contrast. CONTRAST:  153mL OMNIPAQUE IOHEXOL 300 MG/ML  SOLN COMPARISON:  None. FINDINGS: Lower chest: No acute abnormality. Hepatobiliary: No focal hepatic abnormality. Gallbladder unremarkable. Unusual configuration of the liver with gallbladder noted posteriorly. Pancreas: No focal abnormality or ductal dilatation. Spleen: No focal abnormality.  Normal size. Adrenals/Urinary Tract: No adrenal abnormality. No focal renal abnormality. No stones or hydronephrosis. Urinary bladder is unremarkable. Stomach/Bowel: Marked inflammation noted around the 1st and 2nd portions of the duodenum. There is a fluid collection noted anterior to the proximal descending duodenum which communicates with the duodenal lumen. This most likely reflects changes of severe duodenitis/peptic ulcer disease with a large peptic ulcer. Left colonic diverticulosis. No active diverticulitis. Normal appendix. Stomach and small bowel decompressed, unremarkable. Vascular/Lymphatic: Aortic atherosclerosis. No enlarged abdominal or pelvic lymph nodes. Reproductive: No  visible focal abnormality. Other: No free fluid or free air. Musculoskeletal: No acute bony abnormality. IMPRESSION: Marked inflammation and wall thickening involving the proximal duodenum compatible with severe duodenitis/peptic ulcer disease. There is a fluid collection anterior to the proximal duodenum which communicates with the lumen, likely large peptic ulcer. Left colonic diverticulosis.  No active diverticulitis. Electronically Signed   By: Rolm Baptise M.D.   On: 01/13/2020 11:32   [place] EKG SITE RITE  Result Date: 01/14/2020 If Site Rite image not attached, placement could not be confirmed due to current cardiac rhythm.       Scheduled Meds: . insulin aspart  0-9 Units Subcutaneous Q4H  . pantoprazole (PROTONIX) IV  40 mg Intravenous Q12H   Continuous Infusions: . fluconazole (DIFLUCAN) IV Stopped (01/13/20 1637)  . piperacillin-tazobactam (ZOSYN)  IV 3.375 g (01/14/20 0528)     LOS: 1 day    Time spent: 35 minutes    Allah Reason D Manuella Ghazi, DO [facility]  If 7PM-7AM, please contact night-coverage [email or web address] 01/14/2020, 10:45 AM

## 2020-01-15 LAB — GLUCOSE, CAPILLARY
Glucose-Capillary: 115 mg/dL — ABNORMAL HIGH (ref 70–99)
Glucose-Capillary: 135 mg/dL — ABNORMAL HIGH (ref 70–99)
Glucose-Capillary: 138 mg/dL — ABNORMAL HIGH (ref 70–99)
Glucose-Capillary: 161 mg/dL — ABNORMAL HIGH (ref 70–99)
Glucose-Capillary: 184 mg/dL — ABNORMAL HIGH (ref 70–99)
Glucose-Capillary: 185 mg/dL — ABNORMAL HIGH (ref 70–99)

## 2020-01-15 LAB — COMPREHENSIVE METABOLIC PANEL
ALT: 24 U/L (ref 0–44)
AST: 16 U/L (ref 15–41)
Albumin: 3.6 g/dL (ref 3.5–5.0)
Alkaline Phosphatase: 47 U/L (ref 38–126)
Anion gap: 9 (ref 5–15)
BUN: 28 mg/dL — ABNORMAL HIGH (ref 8–23)
CO2: 24 mmol/L (ref 22–32)
Calcium: 9 mg/dL (ref 8.9–10.3)
Chloride: 104 mmol/L (ref 98–111)
Creatinine, Ser: 1.47 mg/dL — ABNORMAL HIGH (ref 0.61–1.24)
GFR calc Af Amer: 54 mL/min — ABNORMAL LOW (ref >60)
GFR calc non Af Amer: 47 mL/min — ABNORMAL LOW (ref >60)
Glucose, Bld: 154 mg/dL — ABNORMAL HIGH (ref 70–99)
Potassium: 4.2 mmol/L (ref 3.5–5.1)
Sodium: 137 mmol/L (ref 135–145)
Total Bilirubin: 0.3 mg/dL (ref 0.3–1.2)
Total Protein: 6.9 g/dL (ref 6.5–8.1)

## 2020-01-15 LAB — DIFFERENTIAL
Abs Immature Granulocytes: 0.05 10*3/uL (ref 0.00–0.07)
Basophils Absolute: 0.1 10*3/uL (ref 0.0–0.1)
Basophils Relative: 1 %
Eosinophils Absolute: 0.4 10*3/uL (ref 0.0–0.5)
Eosinophils Relative: 4 %
Immature Granulocytes: 1 %
Lymphocytes Relative: 17 %
Lymphs Abs: 1.6 10*3/uL (ref 0.7–4.0)
Monocytes Absolute: 0.8 10*3/uL (ref 0.1–1.0)
Monocytes Relative: 9 %
Neutro Abs: 6.4 10*3/uL (ref 1.7–7.7)
Neutrophils Relative %: 68 %

## 2020-01-15 LAB — MAGNESIUM: Magnesium: 2.1 mg/dL (ref 1.7–2.4)

## 2020-01-15 LAB — CBC
HCT: 36.3 % — ABNORMAL LOW (ref 39.0–52.0)
Hemoglobin: 11.4 g/dL — ABNORMAL LOW (ref 13.0–17.0)
MCH: 28.6 pg (ref 26.0–34.0)
MCHC: 31.4 g/dL (ref 30.0–36.0)
MCV: 91.2 fL (ref 80.0–100.0)
Platelets: 240 10*3/uL (ref 150–400)
RBC: 3.98 MIL/uL — ABNORMAL LOW (ref 4.22–5.81)
RDW: 11.9 % (ref 11.5–15.5)
WBC: 9.3 10*3/uL (ref 4.0–10.5)
nRBC: 0 % (ref 0.0–0.2)

## 2020-01-15 LAB — PREALBUMIN: Prealbumin: 20.1 mg/dL (ref 18–38)

## 2020-01-15 LAB — PHOSPHORUS: Phosphorus: 3 mg/dL (ref 2.5–4.6)

## 2020-01-15 LAB — TRIGLYCERIDES: Triglycerides: 127 mg/dL (ref <150)

## 2020-01-15 MED ORDER — TRAVASOL 10 % IV SOLN
INTRAVENOUS | Status: AC
  Filled 2020-01-15: qty 768

## 2020-01-15 NOTE — Progress Notes (Signed)
PROGRESS NOTE    Dennis Yu  JWW:[id number] DOB: 1946-07-21 DOA: 01/13/2020 PCP: [name], MD   Brief Narrative:  Per HPI: Dennis Yu a 74 y.o.malewith medical history significant forischemic cardiomyopathy with LVEF 25%, type 2 diabetes, hypertension, dyslipidemia, prior CVA, and CKD stage IIIa who presented to the ED with complaints of worsening epigastric abdominal pain that began on "Sunday of this week. He cannot describe the pain. He had apparently undergone colonoscopy with snare polypectomy on 6/8 with no complications noted. He did not mention his epigastric abdominal pain to anyone at that time. He states that he has not been eating anything since Sunday as he was scared to exacerbate his pain. His wife confirms this and states that if he had had anything, it may have been some applesauce 1 or 2 days ago. He states that he has not had any bowel movements since his colonoscopy prep. He denies any fevers or chills. He denies any exacerbating or alleviating factors.  -Continues on TPN as well as antibiotics.  No significant complaints.  Assessment & Plan:   Active Problems:   Perforated peptic ulcer ([facility])   Epigastric abdominal pain likely secondary to perforated duodenal ulcer with contained fluid collection -Plan to keep n.p.o.  -NG tube could not be placed -IV PPI twice daily -Continue on IV Zosyn and fluconazole to cover for perforation for total 10-day course -Plan for upper GI/SBFT study per general surgery by tomorrow for further evaluation -Continue intravenous feeding via TPN/PICC line  History of ischemic cardiomyopathy with LVEF 25% -Follows with Dr. McDowell in outpatient setting -Hold outpatient medications of aspirin, Coreg, Benicar, Aldactone, and Lasix until he can tolerate orals   CKD stage IIIa -Baseline creatinine approximately 1.3-1.7 -Monitor repeat labs -Monitor I's and O's and will start TPN shortly  Chronic iron  deficiency anemia-downtrending -Does not appear to be on iron supplementation -No overt bleeding identified, follow CBC  Mixed hyperlipidemia -Hold Zocor  History of CVA -Holding aspirin and statin as well as Coumadin  History of colonic polyps -Status post colonoscopy with snare polypectomy on 01/11/2020 with Dr. Rourk  History of hypertension-stable -Holding home blood pressure agents for now -IV labetalol as needed for severe elevations  History of type 2 diabetes -Mild hyperglycemia currently noted -Hemoglobin A1c 7.8% -Hold home glipizide -Maintain on SSI every 4 hours while n.p.o.   DVT prophylaxis:SCDs Code Status: Full code Family Communication: Discussed with wife at bedside 6/11 Disposition Plan: Patient to remain on bowel rest with TPN/PICC line placement today per general surgery. Status is: Inpatient  Remains inpatient appropriate because:Ongoing diagnostic testing needed not appropriate for outpatient work up and IV treatments appropriate due to intensity of illness or inability to take PO   Dispo: The patient is from: Home  Anticipated d/c is to: Home  Anticipated d/c date is: > 3 days  Patient currently is not medically stable to d/c.  Consultants:   General surgery  Procedures:   As noted below  Antimicrobials:  Anti-infectives (From admission, onward)   Start     Dose/Rate Route Frequency Ordered Stop   01/13/20 2200  piperacillin-tazobactam (ZOSYN) IVPB 3.375 g     Discontinue     3.375 g 12.5 mL/hr over 240 Minutes Intravenous Every 8 hours 01/13/20 1513     06" /10/21 1330  piperacillin-tazobactam (ZOSYN) IVPB 3.375 g        3.375 g 100 mL/hr over 30 Minutes Intravenous  Once 01/13/20 1315 01/13/20 1420   01/13/20  1330  fluconazole (DIFLUCAN) IVPB 400 mg     Discontinue     400 mg 100 mL/hr over 120 Minutes Intravenous Every 24 hours 01/13/20 1326         Subjective: Patient seen and  evaluated today with no new acute complaints or concerns. No acute concerns or events noted overnight.  Objective: Vitals:   01/14/20 1346 01/14/20 2041 01/15/20 0021 01/15/20 0431  BP: 111/76 121/78 105/64 128/84  Pulse: 64 64 66 80  Resp: 15 20 16 16   Temp: 98.5 F (36.9 C) 98.9 F (37.2 C) 98.4 F (36.9 C) 98.1 F (36.7 C)  TempSrc: Oral Oral Oral Oral  SpO2: 98% 97% 97% 95%  Weight:      Height:        Intake/Output Summary (Last 24 hours) at 01/15/2020 1503 Last data filed at 01/15/2020 1032 Gross per 24 hour  Intake 1167.15 ml  Output 775 ml  Net 392.15 ml   Filed Weights   01/13/20 0932 01/13/20 1648 01/14/20 1000  Weight: 92.5 kg 84.5 kg 84.8 kg    Examination:  General exam: Appears calm and comfortable  Respiratory system: Clear to auscultation. Respiratory effort normal. Cardiovascular system: S1 & S2 heard, RRR. No JVD, murmurs, rubs, gallops or clicks. No pedal edema. Gastrointestinal system: Abdomen is nondistended, soft and nontender. No organomegaly or masses felt. Normal bowel sounds heard. Central nervous system: Alert and oriented. No focal neurological deficits. Extremities: Symmetric 5 x 5 power. Skin: No rashes, lesions or ulcers Psychiatry: Judgement and insight appear normal. Mood & affect appropriate.     Data Reviewed: I have personally reviewed following labs and imaging studies  CBC: Recent Labs  Lab 01/13/20 0944 01/14/20 0559 01/15/20 0646  WBC 15.2* 12.5* 9.3  NEUTROABS  --   --  6.4  HGB 13.3 11.4* 11.4*  HCT 42.4 37.6* 36.3*  MCV 90.8 92.6 91.2  PLT 312 266 861   Basic Metabolic Panel: Recent Labs  Lab 01/13/20 0944 01/14/20 0559 01/15/20 0646  NA 138 137 137  K 4.5 4.5 4.2  CL 101 103 104  CO2 27 26 24   GLUCOSE 169* 137* 154*  BUN 31* 33* 28*  CREATININE 1.55* 1.73* 1.47*  CALCIUM 10.9* 9.3 9.0  MG  --  2.0 2.1  PHOS  --   --  3.0   GFR: Estimated Creatinine Clearance: 49.9 mL/min (A) (by C-G formula based on  SCr of 1.47 mg/dL (H)). Liver Function Tests: Recent Labs  Lab 01/13/20 0944 01/14/20 0559 01/15/20 0646  AST 26 16 16   ALT 41 26 24  ALKPHOS 60 49 47  BILITOT 0.7 0.6 0.3  PROT 8.4* 6.9 6.9  ALBUMIN 4.2 3.5 3.6   Recent Labs  Lab 01/13/20 0944  LIPASE 30   No results for input(s): AMMONIA in the last 168 hours. Coagulation Profile: Recent Labs  Lab 01/13/20 0944  INR 1.2   Cardiac Enzymes: No results for input(s): CKTOTAL, CKMB, CKMBINDEX, TROPONINI in the last 168 hours. BNP (last 3 results) No results for input(s): PROBNP in the last 8760 hours. HbA1C: Recent Labs    01/13/20 0944  HGBA1C 7.8*   CBG: Recent Labs  Lab 01/14/20 2039 01/15/20 0024 01/15/20 0434 01/15/20 0750 01/15/20 1129  GLUCAP 139* 135* 161* 185* 138*   Lipid Profile: Recent Labs    01/15/20 0646  TRIG 127   Thyroid Function Tests: No results for input(s): TSH, T4TOTAL, FREET4, T3FREE, THYROIDAB in the last 72 hours. Anemia Panel:  No results for input(s): VITAMINB12, FOLATE, FERRITIN, TIBC, IRON, RETICCTPCT in the last 72 hours. Sepsis Labs: No results for input(s): PROCALCITON, LATICACIDVEN in the last 168 hours.  Recent Results (from the past 240 hour(s))  SARS CORONAVIRUS 2 (TAT 6-24 HRS)     Status: None   Collection Time: 01/07/20 11:58 AM  Result Value Ref Range Status   SARS Coronavirus 2 NEGATIVE NEGATIVE Final    Comment: (NOTE) SARS-CoV-2 target nucleic acids are NOT DETECTED. The SARS-CoV-2 RNA is generally detectable in upper and lower respiratory specimens during the acute phase of infection. Negative results do not preclude SARS-CoV-2 infection, do not rule out co-infections with other pathogens, and should not be used as the sole basis for treatment or other patient management decisions. Negative results must be combined with clinical observations, patient history, and epidemiological information. The expected result is Negative. Fact Sheet for  Patients: SugarRoll.be Fact Sheet for Healthcare Providers: https://www.woods-mathews.com/ This test is not yet approved or cleared by the [place] FDA and  has been authorized for detection and/or diagnosis of SARS-CoV-2 by FDA under an Emergency Use Authorization (EUA). This EUA will remain  in effect (meaning this test can be used) for the duration of the COVID-19 declaration under Section 56 4(b)(1) of the Act, 21 U.S.C. section 360bbb-3(b)(1), unless the authorization is terminated or revoked sooner. Performed at [facility], [place] [address], [place], [place] [address]   SARS Coronavirus 2 by RT PCR (hospital order, performed in [facility] hospital lab) Nasopharyngeal Nasopharyngeal Swab     Status: None   Collection Time: 01/13/20  2:39 PM   Specimen: Nasopharyngeal Swab  Result Value Ref Range Status   SARS Coronavirus 2 NEGATIVE NEGATIVE Final    Comment: (NOTE) SARS-CoV-2 target nucleic acids are NOT DETECTED.  The SARS-CoV-2 RNA is generally detectable in upper and lower respiratory specimens during the acute phase of infection. The lowest concentration of SARS-CoV-2 viral copies this assay can detect is 250 copies / mL. A negative result does not preclude SARS-CoV-2 infection and should not be used as the sole basis for treatment or other patient management decisions.  A negative result may occur with improper specimen collection / handling, submission of specimen other than nasopharyngeal swab, presence of viral mutation(s) within the areas targeted by this assay, and inadequate number of viral copies (<250 copies / mL). A negative result must be combined with clinical observations, patient history, and epidemiological information.  Fact Sheet for Patients:   StrictlyIdeas.no  Fact Sheet for Healthcare Providers: BankingDealers.co.za  This test is not yet approved or   cleared by the [place] FDA and has been authorized for detection and/or diagnosis of SARS-CoV-2 by FDA under an Emergency Use Authorization (EUA).  This EUA will remain in effect (meaning this test can be used) for the duration of the COVID-19 declaration under Section 564(b)(1) of the Act, 21 U.S.C. section 360bbb-3(b)(1), unless the authorization is terminated or revoked sooner.  Performed at [facility], [address] [place], [place] [address]          Radiology Studies: [place] EKG SITE RITE  Result Date: 01/14/2020 If Site Rite image not attached, placement could not be confirmed due to current cardiac rhythm.       Scheduled Meds: . Chlorhexidine Gluconate Cloth  6 each Topical Daily  . insulin aspart  0-9 Units Subcutaneous Q4H  . pantoprazole (PROTONIX) IV  40 mg Intravenous Q12H  . sodium chloride flush  10-40 mL  Intracatheter Q12H   Continuous Infusions: . fluconazole (DIFLUCAN) IV 400 mg (01/15/20 1235)  . lactated ringers 30 mL/hr at 01/14/20 1342  . piperacillin-tazobactam (ZOSYN)  IV Stopped (01/15/20 1032)  . TPN ADULT (ION) 40 mL/hr at 01/14/20 1808  . TPN ADULT (ION)       LOS: 2 days    Time spent: 30 minutes    Horace Lukas Darleen Crocker, DO [facility]  If 7PM-7AM, please contact night-coverage [email or web address] 01/15/2020, 3:03 PM

## 2020-01-15 NOTE — Progress Notes (Signed)
[facility] Progress Note     Subjective: Feeling good. No complaints. Re-iterated plan to patient and family for UGI on Tuesday given the duodenal perforation. TPN for now and IV antibiotics/ antifungals.   Objective: Vital signs in last 24 hours: Temp:  [98.1 F (36.7 C)-98.9 F (37.2 C)] 98.1 F (36.7 C) (06/12 0431) Pulse Rate:  [64-80] 80 (06/12 0431) Resp:  [16-20] 16 (06/12 0431) BP: (105-128)/(64-84) 128/84 (06/12 0431) SpO2:  [95 %-97 %] 95 % (06/12 0431) Last BM Date: 01/10/20  Intake/Output from previous day: 06/11 0701 - 06/12 0700 In: 937.4 [I.V.:587.5; IV Piggyback:350] Out: 700 [Urine:700] Intake/Output this shift: Total I/O In: 249.7 [I.V.:199.7; IV Piggyback:50] Out: 375 [Urine:375]  General appearance: alert, cooperative and no distress Resp: normal work of breathing GI: soft, nondistended, nontender  Lab Results:  Recent Labs    01/14/20 0559 01/15/20 0646  WBC 12.5* 9.3  HGB 11.4* 11.4*  HCT 37.6* 36.3*  PLT 266 240   BMET Recent Labs    01/14/20 0559 01/15/20 0646  NA 137 137  K 4.5 4.2  CL 103 104  CO2 26 24  GLUCOSE 137* 154*  BUN 33* 28*  CREATININE 1.73* 1.47*  CALCIUM 9.3 9.0   PT/INR Recent Labs    01/13/20 0944  LABPROT 14.5  INR 1.2    Studies/Results: [place] EKG SITE RITE  Result Date: 01/14/2020 If Site Rite image not attached, placement could not be confirmed due to current cardiac rhythm.   Anti-infectives: Anti-infectives (From admission, onward)   Start     Dose/Rate Route Frequency Ordered Stop   01/13/20 2200  piperacillin-tazobactam (ZOSYN) IVPB 3.375 g     Discontinue     3.375 g 12.5 mL/hr over 240 Minutes Intravenous Every 8 hours 01/13/20 1513     01/13/20 1330  piperacillin-tazobactam (ZOSYN) IVPB 3.375 g        3.375 g 100 mL/hr over 30 Minutes Intravenous  Once 01/13/20 1315 01/13/20 1420   01/13/20 1330  fluconazole (DIFLUCAN) IVPB 400 mg     Discontinue     400 mg 100 mL/hr  over 120 Minutes Intravenous Every 24 hours 01/13/20 1326        Assessment/Plan: Dennis Yu is a 74 yo with duodenal perforation and contained collection. Plan for UGI on Tuesday.  NPO for now IV protonix IV antibiotics and antifungal, would do 10 day course    LOS: 2 days    Dennis Yu 01/15/2020

## 2020-01-15 NOTE — Progress Notes (Addendum)
PHARMACY - TOTAL PARENTERAL NUTRITION CONSULT NOTE   Indication: perforated bowel  Patient Measurements: Height: 6' (182.9 cm) Weight: 84.8 kg (186 lb 15.2 oz) IBW/kg (Calculated) : 77.6 TPN AdjBW (KG): 84.5 Body mass index is 25.35 kg/m.  Assessment:  74 yo male with hx of CKD-3, CVA, ischemic cardiomyopathy (EF-25%). He presents with abdominal pain. Surgery consulted. Bowel rest planned and pt unable to tolerate placement of NGT. CT-duodenitis and large peptic ulcer with perforation. Plan to start TPN  Glucose / Insulin: 81-185 Electrolytes: WNL range  Renal: Scr 1.73>>1.47 LFTs / TGs: WNL Prealbumin / albumin: 3.6 Intake / Output; MIVF: 1085/600 GI Imaging:CT-duodenitis and large peptic ulcer with perforation  Surgeries / Procedures:   Central access: PICC TPN start date: 01/14/2020  Nutritional Goals (per RD recommendation on 6/21): kCal: 2125-2295, Protein: 68-75  Fluid: > 2 L/day Goal TPN rate is 80 mL/hr (provides 76.8 g of protein and 2188 kcals per day)  Current Nutrition:  NPO  Plan:  Increase TPN to 15mL/hr at 1800 Electrolytes in TPN: 11mEq/L of Na, 43mEq/L of K, 43mEq/L of Ca, 59mEq/L of Mg, and 65mmol/L of Phos. Cl:Ac ratio 1:1. Current TPN providing Prot 76g , CHO 384 g ( 1305 kcal), lipids 576 kcal. Total kcal 2188 kcal. Add standard MVI and trace elements to TPN Initiate Sensitive q4h SSI and adjust as needed  STOP IVF when new TPN initiated Monitor TPN labs on Mon/Thurs  Margot Ables, PharmD Clinical Pharmacist 01/15/2020 9:24 AM

## 2020-01-16 LAB — GLUCOSE, CAPILLARY
Glucose-Capillary: 141 mg/dL — ABNORMAL HIGH (ref 70–99)
Glucose-Capillary: 145 mg/dL — ABNORMAL HIGH (ref 70–99)
Glucose-Capillary: 168 mg/dL — ABNORMAL HIGH (ref 70–99)
Glucose-Capillary: 172 mg/dL — ABNORMAL HIGH (ref 70–99)
Glucose-Capillary: 199 mg/dL — ABNORMAL HIGH (ref 70–99)

## 2020-01-16 MED ORDER — TRAVASOL 10 % IV SOLN
INTRAVENOUS | Status: AC
  Filled 2020-01-16: qty 768

## 2020-01-16 NOTE — Progress Notes (Signed)
PROGRESS NOTE    Dennis Yu  MZU:[id number] DOB: 18-Jun-1946 DOA: 01/13/2020 PCP: [name], MD   Brief Narrative:  Per HPI: Dennis Yu a 74 y.o.malewith medical history significant forischemic cardiomyopathy with LVEF 25%, type 2 diabetes, hypertension, dyslipidemia, prior CVA, and CKD stage IIIa who presented to the ED with complaints of worsening epigastric abdominal pain that began on "Sunday of this week. He cannot describe the pain. He had apparently undergone colonoscopy with snare polypectomy on 6/8 with no complications noted. He did not mention his epigastric abdominal pain to anyone at that time. He states that he has not been eating anything since Sunday as he was scared to exacerbate his pain. His wife confirms this and states that if he had had anything, it may have been some applesauce 1 or 2 days ago. He states that he has not had any bowel movements since his colonoscopy prep. He denies any fevers or chills. He denies any exacerbating or alleviating factors.  -Continues on TPN as well as antibiotics.  No significant complaints.   Assessment & Plan:   Active Problems:   Perforated peptic ulcer (HCC)   Epigastric abdominal pain likely secondary to perforated duodenal ulcer with contained fluid collection -Plan to keep n.p.o. -NG tube could not be placed -IV PPI twice daily -Continue on IV Zosyn and fluconazole to cover for perforation for total 10-day course -Plan for upper GI/SBFT study per general surgery by tomorrow for further evaluation -Continue intravenous feeding via TPN/PICC line  History of ischemic cardiomyopathy with LVEF 25% -Follows with Dr. McDowell in outpatient setting -Hold outpatient medications of aspirin, Coreg, Benicar, Aldactone, and Lasix until he can tolerate orals   CKD stage IIIa -Baseline creatinine approximately 1.3-1.7 -Monitor repeat labs -Monitor I's and O's andwill start TPN shortly  Chronic iron  deficiency anemia-downtrending -Does not appear to be on iron supplementation -No overt bleeding identified, follow CBC  Mixed hyperlipidemia -Hold Zocor  History of CVA -Holding aspirin and statin as well as Coumadin  History of colonic polyps -Status post colonoscopy with snare polypectomy on 01/11/2020 with Dr. Rourk  History of hypertension-stable -Holding home blood pressure agents for now -IV labetalol as needed for severe elevations  History of type 2 diabetes -Mild hyperglycemia currently noted -Hemoglobin A1c 7.8% -Hold home glipizide -Maintain on SSI every 4 hours while n.p.o.   DVT prophylaxis:SCDs Code Status:Full code Family Communication:Discussed with wife at bedside6/11 Disposition Plan:Patient to remain on bowel rest with TPN/PICC until further GI studies planned for 6/15. Status is: Inpatient  Remains inpatient appropriate because:Ongoing diagnostic testing needed not appropriate for outpatient work up and IV treatments appropriate due to intensity of illness or inability to take PO   Dispo: The patient is from:Home Anticipated d/c is to:Home Anticipated d/c date is: > 3 days Patient currently is not medically stable to d/c.  Consultants:  General surgery  Procedures:  As noted below  Antimicrobials:  Anti-infectives (From admission, onward)   Start     Dose/Rate Route Frequency Ordered Stop   01/13/20 2200  piperacillin-tazobactam (ZOSYN) IVPB 3.375 g     Discontinue     3.375 g 12.5 mL/hr over 240 Minutes Intravenous Every 8 hours 01/13/20 1513     06" /10/21 1330  piperacillin-tazobactam (ZOSYN) IVPB 3.375 g        3.375 g 100 mL/hr over 30 Minutes Intravenous  Once 01/13/20 1315 01/13/20 1420   01/13/20 1330  fluconazole (DIFLUCAN) IVPB 400 mg  Discontinue     400 mg 100 mL/hr over 120 Minutes Intravenous Every 24 hours 01/13/20 1326         Subjective: Patient seen and  evaluated today with no new acute complaints or concerns. No acute concerns or events noted overnight.  Objective: Vitals:   01/15/20 0431 01/15/20 1614 01/15/20 2017 01/16/20 0602  BP: 128/84 123/74 (!) 116/58 118/77  Pulse: 80 73 72 78  Resp: 16 20 16 16   Temp: 98.1 F (36.7 C) 98.6 F (37 C) 99.3 F (37.4 C) 98.1 F (36.7 C)  TempSrc: Oral Oral Oral Oral  SpO2: 95% 100% 96% 96%  Weight:      Height:        Intake/Output Summary (Last 24 hours) at 01/16/2020 1038 Last data filed at 01/15/2020 1806 Gross per 24 hour  Intake 600.99 ml  Output 350 ml  Net 250.99 ml   Filed Weights   01/13/20 0932 01/13/20 1648 01/14/20 1000  Weight: 92.5 kg 84.5 kg 84.8 kg    Examination:  General exam: Appears calm and comfortable  Respiratory system: Clear to auscultation. Respiratory effort normal. Cardiovascular system: S1 & S2 heard, RRR. No JVD, murmurs, rubs, gallops or clicks. No pedal edema. Gastrointestinal system: Abdomen is nondistended, soft and nontender. No organomegaly or masses felt. Normal bowel sounds heard. Central nervous system: Alert and oriented. No focal neurological deficits. Extremities: Symmetric 5 x 5 power. Skin: No rashes, lesions or ulcers Psychiatry: Judgement and insight appear normal. Mood & affect appropriate.     Data Reviewed: I have personally reviewed following labs and imaging studies  CBC: Recent Labs  Lab 01/13/20 0944 01/14/20 0559 01/15/20 0646  WBC 15.2* 12.5* 9.3  NEUTROABS  --   --  6.4  HGB 13.3 11.4* 11.4*  HCT 42.4 37.6* 36.3*  MCV 90.8 92.6 91.2  PLT 312 266 893   Basic Metabolic Panel: Recent Labs  Lab 01/13/20 0944 01/14/20 0559 01/15/20 0646  NA 138 137 137  K 4.5 4.5 4.2  CL 101 103 104  CO2 27 26 24   GLUCOSE 169* 137* 154*  BUN 31* 33* 28*  CREATININE 1.55* 1.73* 1.47*  CALCIUM 10.9* 9.3 9.0  MG  --  2.0 2.1  PHOS  --   --  3.0   GFR: Estimated Creatinine Clearance: 49.9 mL/min (A) (by C-G formula based  on SCr of 1.47 mg/dL (H)). Liver Function Tests: Recent Labs  Lab 01/13/20 0944 01/14/20 0559 01/15/20 0646  AST 26 16 16   ALT 41 26 24  ALKPHOS 60 49 47  BILITOT 0.7 0.6 0.3  PROT 8.4* 6.9 6.9  ALBUMIN 4.2 3.5 3.6   Recent Labs  Lab 01/13/20 0944  LIPASE 30   No results for input(s): AMMONIA in the last 168 hours. Coagulation Profile: Recent Labs  Lab 01/13/20 0944  INR 1.2   Cardiac Enzymes: No results for input(s): CKTOTAL, CKMB, CKMBINDEX, TROPONINI in the last 168 hours. BNP (last 3 results) No results for input(s): PROBNP in the last 8760 hours. HbA1C: No results for input(s): HGBA1C in the last 72 hours. CBG: Recent Labs  Lab 01/15/20 1129 01/15/20 1610 01/15/20 2014 01/16/20 0013 01/16/20 0342  GLUCAP 138* 115* 184* 172* 199*   Lipid Profile: Recent Labs    01/15/20 0646  TRIG 127   Thyroid Function Tests: No results for input(s): TSH, T4TOTAL, FREET4, T3FREE, THYROIDAB in the last 72 hours. Anemia Panel: No results for input(s): VITAMINB12, FOLATE, FERRITIN, TIBC, IRON, RETICCTPCT in the  last 72 hours. Sepsis Labs: No results for input(s): PROCALCITON, LATICACIDVEN in the last 168 hours.  Recent Results (from the past 240 hour(s))  SARS CORONAVIRUS 2 (TAT 6-24 HRS)     Status: None   Collection Time: 01/07/20 11:58 AM  Result Value Ref Range Status   SARS Coronavirus 2 NEGATIVE NEGATIVE Final    Comment: (NOTE) SARS-CoV-2 target nucleic acids are NOT DETECTED. The SARS-CoV-2 RNA is generally detectable in upper and lower respiratory specimens during the acute phase of infection. Negative results do not preclude SARS-CoV-2 infection, do not rule out co-infections with other pathogens, and should not be used as the sole basis for treatment or other patient management decisions. Negative results must be combined with clinical observations, patient history, and epidemiological information. The expected result is Negative. Fact Sheet for  Patients: SugarRoll.be Fact Sheet for Healthcare Providers: https://www.woods-mathews.com/ This test is not yet approved or cleared by the [place] FDA and  has been authorized for detection and/or diagnosis of SARS-CoV-2 by FDA under an Emergency Use Authorization (EUA). This EUA will remain  in effect (meaning this test can be used) for the duration of the COVID-19 declaration under Section 56 4(b)(1) of the Act, 21 U.S.C. section 360bbb-3(b)(1), unless the authorization is terminated or revoked sooner. Performed at [facility], [place] [address] [place], [place] [address]   SARS Coronavirus 2 by RT PCR (hospital order, performed in [facility] hospital lab) Nasopharyngeal Nasopharyngeal Swab     Status: None   Collection Time: 01/13/20  2:39 PM   Specimen: Nasopharyngeal Swab  Result Value Ref Range Status   SARS Coronavirus 2 NEGATIVE NEGATIVE Final    Comment: (NOTE) SARS-CoV-2 target nucleic acids are NOT DETECTED.  The SARS-CoV-2 RNA is generally detectable in upper and lower respiratory specimens during the acute phase of infection. The lowest concentration of SARS-CoV-2 viral copies this assay can detect is 250 copies / mL. A negative result does not preclude SARS-CoV-2 infection and should not be used as the sole basis for treatment or other patient management decisions.  A negative result may occur with improper specimen collection / handling, submission of specimen other than nasopharyngeal swab, presence of viral mutation(s) within the areas targeted by this assay, and inadequate number of viral copies (<250 copies / mL). A negative result must be combined with clinical observations, patient history, and epidemiological information.  Fact Sheet for Patients:   StrictlyIdeas.no  Fact Sheet for Healthcare Providers: BankingDealers.co.za  This test is not yet approved or   cleared by the [place] FDA and has been authorized for detection and/or diagnosis of SARS-CoV-2 by FDA under an Emergency Use Authorization (EUA).  This EUA will remain in effect (meaning this test can be used) for the duration of the COVID-19 declaration under Section 564(b)(1) of the Act, 21 U.S.C. section 360bbb-3(b)(1), unless the authorization is terminated or revoked sooner.  Performed at [facility], [address] [place], [place] [address]          Radiology Studies: No results found.      Scheduled Meds: . Chlorhexidine Gluconate Cloth  6 each Topical Daily  . insulin aspart  0-9 Units Subcutaneous Q4H  . pantoprazole (PROTONIX) IV  40 mg Intravenous Q12H  . sodium chloride flush  10-40 mL Intracatheter Q12H   Continuous Infusions: . fluconazole (DIFLUCAN) IV 400 mg (01/15/20 1235)  . piperacillin-tazobactam (ZOSYN)  IV 3.375 g (01/16/20 0600)  . TPN ADULT (ION) 80 mL/hr at 01/15/20 1806  .  TPN ADULT (ION)       LOS: 3 days    Time spent: 30 minutes    Onika Gudiel Darleen Crocker, DO [facility]  If 7PM-7AM, please contact night-coverage [email or web address] 01/16/2020, 10:38 AM

## 2020-01-16 NOTE — Progress Notes (Signed)
PHARMACY - TOTAL PARENTERAL NUTRITION CONSULT NOTE   Indication: perforated bowel  Patient Measurements: Height: 6' (182.9 cm) Weight: 84.8 kg (186 lb 15.2 oz) IBW/kg (Calculated) : 77.6 TPN AdjBW (KG): 84.5 Body mass index is 25.35 kg/m.  Assessment:  74 yo male with hx of CKD-3, CVA, ischemic cardiomyopathy (EF-25%). He presents with abdominal pain. Surgery consulted. Bowel rest planned and pt unable to tolerate placement of NGT. CT-duodenitis and large peptic ulcer with perforation. Plan to start TPN  Glucose / Insulin: 115-199  7 units in last 24 hours Electrolytes: WNL range  Renal: Scr 1.73>>1.47 LFTs / TGs: WNL Prealbumin / albumin: 3.6 Intake / Output; MIVF: 1085/600 GI Imaging:CT-duodenitis and large peptic ulcer with perforation  Surgeries / Procedures:   Central access: PICC TPN start date: 01/14/2020  Nutritional Goals (per RD recommendation on 6/21): kCal: 2125-2295, Protein: 68-75  Fluid: > 2 L/day Goal TPN rate is 80 mL/hr (provides 76.8 g of protein and 2188 kcals per day)  Current Nutrition:  NPO  Plan:  Continue TPN to 77mL/hr at 1800 Electrolytes in TPN: 26mEq/L of Na, 109mEq/L of K, 79mEq/L of Ca, 30mEq/L of Mg, and 36mmol/L of Phos. Cl:Ac ratio 1:1. Current TPN providing Prot 76g , CHO 384 g ( 1305 kcal), lipids 576 kcal. Total kcal 2188 kcal. Add standard MVI and trace elements to TPN Initiate Sensitive q4h SSI and adjust as needed  Monitor TPN labs on Mon/Thurs  Margot Ables, PharmD Clinical Pharmacist 01/16/2020 9:18 AM

## 2020-01-16 NOTE — Progress Notes (Signed)
Pharmacy Antibiotic Note  Dennis Yu is a 74 y.o. male admitted on 01/13/2020 with intra abdominal infection.  Pharmacy has been consulted for zosyn dosing.  Plan: Zosyn 3.375g IV q8h (4 hour infusion).  Monitor labs, c/s, and patient improvement.  Height: 6' (182.9 cm) Weight: 84.8 kg (186 lb 15.2 oz) IBW/kg (Calculated) : 77.6  Temp (24hrs), Avg:98.7 F (37.1 C), Min:98.1 F (36.7 C), Max:99.3 F (37.4 C)  Recent Labs  Lab 01/13/20 0944 01/14/20 0559 01/15/20 0646  WBC 15.2* 12.5* 9.3  CREATININE 1.55* 1.73* 1.47*    Estimated Creatinine Clearance: 49.9 mL/min (A) (by C-G formula based on SCr of 1.47 mg/dL (H)).    No Known Allergies  Antimicrobials this admission: 6/10 zosyn >>  6/10 fluconzole >>   Microbiology results: 6/10 Covid 19: negative  Thank you for allowing pharmacy to be a part of this patient's care.  Ramond Craver 01/16/2020 9:22 AM

## 2020-01-16 NOTE — Progress Notes (Signed)
[facility] Progress Note     Subjective: No complaints. Sitting in chair. No pain.   Objective: Vital signs in last 24 hours: Temp:  [98.1 F (36.7 C)-99.3 F (37.4 C)] 98.1 F (36.7 C) (06/13 0602) Pulse Rate:  [72-78] 78 (06/13 0602) Resp:  [16-20] 16 (06/13 0602) BP: (116-123)/(58-77) 118/77 (06/13 0602) SpO2:  [96 %-100 %] 96 % (06/13 0602) Last BM Date: 01/10/20  Intake/Output from previous day: 06/12 0701 - 06/13 0700 In: 850.7 [I.V.:576.3; IV Piggyback:274.5] Out: 725 [Urine:725] Intake/Output this shift: No intake/output data recorded.  General appearance: alert, cooperative and no distress Resp: normal work of breathing GI: soft, nondistended and nontender   Lab Results:  Recent Labs    01/14/20 0559 01/15/20 0646  WBC 12.5* 9.3  HGB 11.4* 11.4*  HCT 37.6* 36.3*  PLT 266 240   BMET Recent Labs    01/14/20 0559 01/15/20 0646  NA 137 137  K 4.5 4.2  CL 103 104  CO2 26 24  GLUCOSE 137* 154*  BUN 33* 28*  CREATININE 1.73* 1.47*  CALCIUM 9.3 9.0   PT/INR No results for input(s): LABPROT, INR in the last 72 hours.  Studies/Results: No results found.  Anti-infectives: Anti-infectives (From admission, onward)   Start     Dose/Rate Route Frequency Ordered Stop   01/13/20 2200  piperacillin-tazobactam (ZOSYN) IVPB 3.375 g     Discontinue     3.375 g 12.5 mL/hr over 240 Minutes Intravenous Every 8 hours 01/13/20 1513     01/13/20 1330  piperacillin-tazobactam (ZOSYN) IVPB 3.375 g        3.375 g 100 mL/hr over 30 Minutes Intravenous  Once 01/13/20 1315 01/13/20 1420   01/13/20 1330  fluconazole (DIFLUCAN) IVPB 400 mg     Discontinue     400 mg 100 mL/hr over 120 Minutes Intravenous Every 24 hours 01/13/20 1326        Assessment/Plan: Dennis Yu is a 74 yo with perforated duodenal ulcer. Doing well.  PRN for pain NPO TPN UGI on Tuesday IV antibiotics/ antifungals for total 10 days    LOS: 3 days    Dennis Yu 01/16/2020

## 2020-01-17 ENCOUNTER — Encounter (HOSPITAL_COMMUNITY): Payer: Self-pay | Admitting: Internal Medicine

## 2020-01-17 LAB — GLUCOSE, CAPILLARY
Glucose-Capillary: 151 mg/dL — ABNORMAL HIGH (ref 70–99)
Glucose-Capillary: 154 mg/dL — ABNORMAL HIGH (ref 70–99)
Glucose-Capillary: 169 mg/dL — ABNORMAL HIGH (ref 70–99)
Glucose-Capillary: 209 mg/dL — ABNORMAL HIGH (ref 70–99)
Glucose-Capillary: 214 mg/dL — ABNORMAL HIGH (ref 70–99)
Glucose-Capillary: 214 mg/dL — ABNORMAL HIGH (ref 70–99)

## 2020-01-17 LAB — PREALBUMIN: Prealbumin: 23.3 mg/dL (ref 18–38)

## 2020-01-17 LAB — COMPREHENSIVE METABOLIC PANEL
ALT: 54 U/L — ABNORMAL HIGH (ref 0–44)
AST: 34 U/L (ref 15–41)
Albumin: 3.5 g/dL (ref 3.5–5.0)
Alkaline Phosphatase: 46 U/L (ref 38–126)
Anion gap: 9 (ref 5–15)
BUN: 21 mg/dL (ref 8–23)
CO2: 22 mmol/L (ref 22–32)
Calcium: 8.9 mg/dL (ref 8.9–10.3)
Chloride: 101 mmol/L (ref 98–111)
Creatinine, Ser: 1.48 mg/dL — ABNORMAL HIGH (ref 0.61–1.24)
GFR calc Af Amer: 54 mL/min — ABNORMAL LOW (ref >60)
GFR calc non Af Amer: 47 mL/min — ABNORMAL LOW (ref >60)
Glucose, Bld: 191 mg/dL — ABNORMAL HIGH (ref 70–99)
Potassium: 4.4 mmol/L (ref 3.5–5.1)
Sodium: 132 mmol/L — ABNORMAL LOW (ref 135–145)
Total Bilirubin: 0.3 mg/dL (ref 0.3–1.2)
Total Protein: 7.1 g/dL (ref 6.5–8.1)

## 2020-01-17 LAB — CBC
HCT: 37 % — ABNORMAL LOW (ref 39.0–52.0)
Hemoglobin: 11.4 g/dL — ABNORMAL LOW (ref 13.0–17.0)
MCH: 28.5 pg (ref 26.0–34.0)
MCHC: 30.8 g/dL (ref 30.0–36.0)
MCV: 92.5 fL (ref 80.0–100.0)
Platelets: 203 10*3/uL (ref 150–400)
RBC: 4 MIL/uL — ABNORMAL LOW (ref 4.22–5.81)
RDW: 11.9 % (ref 11.5–15.5)
WBC: 9 10*3/uL (ref 4.0–10.5)
nRBC: 0 % (ref 0.0–0.2)

## 2020-01-17 LAB — DIFFERENTIAL
Abs Immature Granulocytes: 0.03 10*3/uL (ref 0.00–0.07)
Basophils Absolute: 0.1 10*3/uL (ref 0.0–0.1)
Basophils Relative: 1 %
Eosinophils Absolute: 0.4 10*3/uL (ref 0.0–0.5)
Eosinophils Relative: 4 %
Immature Granulocytes: 0 %
Lymphocytes Relative: 13 %
Lymphs Abs: 1.2 10*3/uL (ref 0.7–4.0)
Monocytes Absolute: 0.7 10*3/uL (ref 0.1–1.0)
Monocytes Relative: 8 %
Neutro Abs: 6.7 10*3/uL (ref 1.7–7.7)
Neutrophils Relative %: 74 %

## 2020-01-17 LAB — TRIGLYCERIDES: Triglycerides: 143 mg/dL (ref <150)

## 2020-01-17 LAB — MAGNESIUM: Magnesium: 2.2 mg/dL (ref 1.7–2.4)

## 2020-01-17 LAB — PHOSPHORUS: Phosphorus: 2.9 mg/dL (ref 2.5–4.6)

## 2020-01-17 MED ORDER — TRAVASOL 10 % IV SOLN
INTRAVENOUS | Status: AC
  Filled 2020-01-17: qty 768

## 2020-01-17 NOTE — Progress Notes (Signed)
[facility]  No complaints. Abdomen soft and non tender, nondistended. Plan for UGI Tuesday morning. Order placed. Continue NPO, IV antibiotiocs, TPN for now.   Curlene Labrum, MD [facility] [address] [place], [place] [address] [phone] (office)

## 2020-01-17 NOTE — Care Management Important Message (Signed)
Important Message  Patient Details  Name: Dennis Yu MRN: [id number] Date of Birth: 1945/11/01   Medicare Important Message Given:  Yes     Tommy Medal 01/17/2020, 1:58 PM

## 2020-01-17 NOTE — Progress Notes (Signed)
PROGRESS NOTE    NITIN MCKOWEN  TCK:[id number] DOB: [birth year] DOA: 01/13/2020 PCP: [name], MD   Brief Narrative:  Per HPI: Dennis Yu a 74 y.o.malewith medical history significant forischemic cardiomyopathy with LVEF 25%, type 2 diabetes, hypertension, dyslipidemia, prior CVA, and CKD stage IIIa who presented to the ED with complaints of worsening epigastric abdominal pain that began on "Sunday of this week. He cannot describe the pain. He had apparently undergone colonoscopy with snare polypectomy on 6/8 with no complications noted. He did not mention his epigastric abdominal pain to anyone at that time. He states that he has not been eating anything since Sunday as he was scared to exacerbate his pain. His wife confirms this and states that if he had had anything, it may have been some applesauce 1 or 2 days ago. He states that he has not had any bowel movements since his colonoscopy prep. He denies any fevers or chills. He denies any exacerbating or alleviating factors.  -Continues on TPN as well as antibiotics. No significant complaints.  Plan for upper GI series per general surgery tomorrow AM.   Assessment & Plan:   Active Problems:   Perforated peptic ulcer (HCC)   Epigastric abdominal pain likely secondary to perforated duodenal ulcer with contained fluid collection -Plan to keep n.p.o.with upper GI series plan for further evaluation on 6/15 -NG tube could not be placed -IV PPI twice daily -Continue on IV Zosyn and fluconazole to cover for perforationfor total 10-day course (day 5/10) -Plan for upper GI/SBFT study per general surgery by tomorrow for further evaluation -Continue intravenous feeding via TPN/PICC line  History of ischemic cardiomyopathy with LVEF 25% -Follows with Dr. McDowell in outpatient setting -Hold outpatient medications of aspirin, Coreg, Benicar, Aldactone, and Lasix until he can tolerate orals   CKD stage  IIIa-stable -Baseline creatinine approximately 1.3-1.7 -Monitor repeat labs -Monitor I's and O's andwill start TPN shortly  Chronic iron deficiency anemia-stable -Does not appear to be on iron supplementation -No overt bleeding identified, follow CBC  Mixed hyperlipidemia -Hold Zocor  History of CVA -Holding aspirin and statin as well as Coumadin  History of colonic polyps -Status post colonoscopy with snare polypectomy on 01/11/2020 with Dr. Rourk  History of hypertension-stable -Holding home blood pressure agents for now -IV labetalol as needed for severe elevations  History of type 2 diabetes -Mild hyperglycemia currently noted -Hemoglobin A1c 7.8% -Hold home glipizide -Maintain on SSI every 4 hours while n.p.o.   DVT prophylaxis:SCDs Code Status:Full code Family Communication:Discussed with wife at bedside6/14 Disposition Plan:Patient to remain on bowel rest with TPN/PICC until further GI studies planned for 6/15. Status is: Inpatient  Remains inpatient appropriate because:Ongoing diagnostic testing needed not appropriate for outpatient work up and IV treatments appropriate due to intensity of illness or inability to take PO   Dispo: The patient is from:Home Anticipated d/c is to:Home Anticipated d/c date is: > 3 days Patient currently is not medically stable to d/c.  Consultants:  General surgery  Procedures:  As noted below  Antimicrobials:  Anti-infectives (From admission, onward)   Start     Dose/Rate Route Frequency Ordered Stop   01/13/20 2200  piperacillin-tazobactam (ZOSYN) IVPB 3.375 g     Discontinue     3.375 g 12.5 mL/hr over 240 Minutes Intravenous Every 8 hours 01/13/20 1513     06" /10/21 1330  piperacillin-tazobactam (ZOSYN) IVPB 3.375 g        3.375 g 100 mL/hr over 30 Minutes Intravenous  Once 01/13/20 1315 01/13/20 1420   01/13/20 1330  fluconazole (DIFLUCAN) IVPB 400 mg      Discontinue     400 mg 100 mL/hr over 120 Minutes Intravenous Every 24 hours 01/13/20 1326         Subjective: Patient seen and evaluated today with no new acute complaints or concerns. No acute concerns or events noted overnight.  Objective: Vitals:   01/16/20 0602 01/16/20 1715 01/16/20 2014 01/17/20 0550  BP: 118/77 119/81 118/79 117/80  Pulse: 78 75 71 80  Resp: 16 20 20 16   Temp: 98.1 F (36.7 C) 98.9 F (37.2 C) 99.1 F (37.3 C) 98.5 F (36.9 C)  TempSrc: Oral Oral Oral Oral  SpO2: 96% 100% 97% 96%  Weight:      Height:        Intake/Output Summary (Last 24 hours) at 01/17/2020 0905 Last data filed at 01/17/2020 0600 Gross per 24 hour  Intake 1422.74 ml  Output --  Net 1422.74 ml   Filed Weights   01/13/20 0932 01/13/20 1648 01/14/20 1000  Weight: 92.5 kg 84.5 kg 84.8 kg    Examination:  General exam: Appears calm and comfortable  Respiratory system: Clear to auscultation. Respiratory effort normal. Cardiovascular system: S1 & S2 heard, RRR. No JVD, murmurs, rubs, gallops or clicks. No pedal edema. Gastrointestinal system: Abdomen is nondistended, soft and nontender. No organomegaly or masses felt. Normal bowel sounds heard. Central nervous system: Alert and oriented. No focal neurological deficits. Extremities: Symmetric 5 x 5 power. Skin: No rashes, lesions or ulcers Psychiatry: Judgement and insight appear normal. Mood & affect appropriate.     Data Reviewed: I have personally reviewed following labs and imaging studies  CBC: Recent Labs  Lab 01/13/20 0944 01/14/20 0559 01/15/20 0646 01/17/20 0600  WBC 15.2* 12.5* 9.3 9.0  NEUTROABS  --   --  6.4 6.7  HGB 13.3 11.4* 11.4* 11.4*  HCT 42.4 37.6* 36.3* 37.0*  MCV 90.8 92.6 91.2 92.5  PLT 312 266 240 270   Basic Metabolic Panel: Recent Labs  Lab 01/13/20 0944 01/14/20 0559 01/15/20 0646 01/17/20 0600  NA 138 137 137 132*  K 4.5 4.5 4.2 4.4  CL 101 103 104 101  CO2 27 26 24 22   GLUCOSE  169* 137* 154* 191*  BUN 31* 33* 28* 21  CREATININE 1.55* 1.73* 1.47* 1.48*  CALCIUM 10.9* 9.3 9.0 8.9  MG  --  2.0 2.1 2.2  PHOS  --   --  3.0 2.9   GFR: Estimated Creatinine Clearance: 49.5 mL/min (A) (by C-G formula based on SCr of 1.48 mg/dL (H)). Liver Function Tests: Recent Labs  Lab 01/13/20 0944 01/14/20 0559 01/15/20 0646 01/17/20 0600  AST 26 16 16  34  ALT 41 26 24 54*  ALKPHOS 60 49 47 46  BILITOT 0.7 0.6 0.3 0.3  PROT 8.4* 6.9 6.9 7.1  ALBUMIN 4.2 3.5 3.6 3.5   Recent Labs  Lab 01/13/20 0944  LIPASE 30   No results for input(s): AMMONIA in the last 168 hours. Coagulation Profile: Recent Labs  Lab 01/13/20 0944  INR 1.2   Cardiac Enzymes: No results for input(s): CKTOTAL, CKMB, CKMBINDEX, TROPONINI in the last 168 hours. BNP (last 3 results) No results for input(s): PROBNP in the last 8760 hours. HbA1C: No results for input(s): HGBA1C in the last 72 hours. CBG: Recent Labs  Lab 01/16/20 1717 01/16/20 2012 01/17/20 0009 01/17/20 0437 01/17/20 0741  GLUCAP 145* 168* 214* 214* 154*  Lipid Profile: Recent Labs    01/15/20 0646  TRIG 127   Thyroid Function Tests: No results for input(s): TSH, T4TOTAL, FREET4, T3FREE, THYROIDAB in the last 72 hours. Anemia Panel: No results for input(s): VITAMINB12, FOLATE, FERRITIN, TIBC, IRON, RETICCTPCT in the last 72 hours. Sepsis Labs: No results for input(s): PROCALCITON, LATICACIDVEN in the last 168 hours.  Recent Results (from the past 240 hour(s))  SARS CORONAVIRUS 2 (TAT 6-24 HRS)     Status: None   Collection Time: 01/07/20 11:58 AM  Result Value Ref Range Status   SARS Coronavirus 2 NEGATIVE NEGATIVE Final    Comment: (NOTE) SARS-CoV-2 target nucleic acids are NOT DETECTED. The SARS-CoV-2 RNA is generally detectable in upper and lower respiratory specimens during the acute phase of infection. Negative results do not preclude SARS-CoV-2 infection, do not rule out co-infections with other  pathogens, and should not be used as the sole basis for treatment or other patient management decisions. Negative results must be combined with clinical observations, patient history, and epidemiological information. The expected result is Negative. Fact Sheet for Patients: SugarRoll.be Fact Sheet for Healthcare Providers: https://www.woods-mathews.com/ This test is not yet approved or cleared by the [place] FDA and  has been authorized for detection and/or diagnosis of SARS-CoV-2 by FDA under an Emergency Use Authorization (EUA). This EUA will remain  in effect (meaning this test can be used) for the duration of the COVID-19 declaration under Section 56 4(b)(1) of the Act, 21 U.S.C. section 360bbb-3(b)(1), unless the authorization is terminated or revoked sooner. Performed at [facility], [place] [address] [place], [place] [address]   SARS Coronavirus 2 by RT PCR (hospital order, performed in [facility] hospital lab) Nasopharyngeal Nasopharyngeal Swab     Status: None   Collection Time: 01/13/20  2:39 PM   Specimen: Nasopharyngeal Swab  Result Value Ref Range Status   SARS Coronavirus 2 NEGATIVE NEGATIVE Final    Comment: (NOTE) SARS-CoV-2 target nucleic acids are NOT DETECTED.  The SARS-CoV-2 RNA is generally detectable in upper and lower respiratory specimens during the acute phase of infection. The lowest concentration of SARS-CoV-2 viral copies this assay can detect is 250 copies / mL. A negative result does not preclude SARS-CoV-2 infection and should not be used as the sole basis for treatment or other patient management decisions.  A negative result may occur with improper specimen collection / handling, submission of specimen other than nasopharyngeal swab, presence of viral mutation(s) within the areas targeted by this assay, and inadequate number of viral copies (<250 copies / mL). A negative result must be combined  with clinical observations, patient history, and epidemiological information.  Fact Sheet for Patients:   StrictlyIdeas.no  Fact Sheet for Healthcare Providers: BankingDealers.co.za  This test is not yet approved or  cleared by the [place] FDA and has been authorized for detection and/or diagnosis of SARS-CoV-2 by FDA under an Emergency Use Authorization (EUA).  This EUA will remain in effect (meaning this test can be used) for the duration of the COVID-19 declaration under Section 564(b)(1) of the Act, 21 U.S.C. section 360bbb-3(b)(1), unless the authorization is terminated or revoked sooner.  Performed at [facility], [address] [place],  [address]          Radiology Studies: No results found.      Scheduled Meds: . Chlorhexidine Gluconate Cloth  6 each Topical Daily  . insulin aspart  0-9 Units Subcutaneous Q4H  . pantoprazole (PROTONIX) IV  40  mg Intravenous Q12H  . sodium chloride flush  10-40 mL Intracatheter Q12H   Continuous Infusions: . fluconazole (DIFLUCAN) IV 400 mg (01/16/20 1233)  . piperacillin-tazobactam (ZOSYN)  IV 3.375 g (01/17/20 0551)  . TPN ADULT (ION) 80 mL/hr at 01/17/20 0600  . TPN ADULT (ION)       LOS: 4 days    Time spent: 30 minutes    Tyesha Joffe Darleen Crocker, DO [facility]  If 7PM-7AM, please contact night-coverage [email or web address] 01/17/2020, 9:05 AM

## 2020-01-17 NOTE — Progress Notes (Addendum)
PHARMACY - TOTAL PARENTERAL NUTRITION CONSULT NOTE   Indication: perforated bowel  Patient Measurements: Height: 6' (182.9 cm) Weight: 84.8 kg (186 lb 15.2 oz) IBW/kg (Calculated) : 77.6 TPN AdjBW (KG): 84.5 Body mass index is 25.35 kg/m.  Assessment:  74 yo male with hx of CKD-3, CVA, ischemic cardiomyopathy (EF-25%). He presents with abdominal pain. Surgery consulted. Bowel rest planned and pt unable to tolerate placement of NGT. CT-duodenitis and large peptic ulcer with perforation. Plan to start TPN  Glucose / Insulin: 145-214. 12 units in last 24 hours Electrolytes: Na 132. Other electrolytes WNL Renal: Scr 1.73>>1.48  LFTs / TGs: ALT 54. Others WNL Prealbumin / albumin: 3.5 Intake / Output; MIVF: 1085/600 GI Imaging:CT-duodenitis and large peptic ulcer with perforation UGI 6/15  Surgeries / Procedures:   Central access: PICC TPN start date: 01/14/2020  Nutritional Goals (per RD recommendation on 6/21): kCal: 2125-2295, Protein: 68-75  Fluid: > 2 L/day Goal TPN rate is 80 mL/hr (provides 76.8 g of protein and 2188 kcals per day)  Current Nutrition:  NPO  Plan:  Continue TPN to 91mL/hr at 1800 Electrolytes in TPN: 40mEq/L of Na, 81mEq/L of K, 16mEq/L of Ca, 69mEq/L of Mg, and 41mmol/L of Phos. Cl:Ac ratio 1:1. Current TPN providing Prot 76g , CHO 384 g ( 1305 kcal), lipids 576 kcal. Total kcal 2188 kcal. Add standard MVI and trace elements to TPN Add 8 units insulin to TPN Initiate Sensitive q4h SSI and adjust as needed  Monitor TPN labs on Mon/Thurs  Margot Ables, PharmD Clinical Pharmacist 01/17/2020 8:30 AM

## 2020-01-18 ENCOUNTER — Inpatient Hospital Stay (HOSPITAL_COMMUNITY): Payer: Medicare Other

## 2020-01-18 DIAGNOSIS — E1121 Type 2 diabetes mellitus with diabetic nephropathy: Secondary | ICD-10-CM

## 2020-01-18 DIAGNOSIS — I5022 Chronic systolic (congestive) heart failure: Secondary | ICD-10-CM

## 2020-01-18 DIAGNOSIS — N1831 Chronic kidney disease, stage 3a: Secondary | ICD-10-CM

## 2020-01-18 DIAGNOSIS — K265 Chronic or unspecified duodenal ulcer with perforation: Principal | ICD-10-CM

## 2020-01-18 LAB — GLUCOSE, CAPILLARY
Glucose-Capillary: 161 mg/dL — ABNORMAL HIGH (ref 70–99)
Glucose-Capillary: 162 mg/dL — ABNORMAL HIGH (ref 70–99)
Glucose-Capillary: 170 mg/dL — ABNORMAL HIGH (ref 70–99)
Glucose-Capillary: 181 mg/dL — ABNORMAL HIGH (ref 70–99)
Glucose-Capillary: 215 mg/dL — ABNORMAL HIGH (ref 70–99)
Glucose-Capillary: 224 mg/dL — ABNORMAL HIGH (ref 70–99)

## 2020-01-18 MED ORDER — IOHEXOL 300 MG/ML  SOLN
150.0000 mL | Freq: Once | INTRAMUSCULAR | Status: AC | PRN
  Administered 2020-01-18: 150 mL via ORAL

## 2020-01-18 MED ORDER — TRAVASOL 10 % IV SOLN
INTRAVENOUS | Status: AC
  Filled 2020-01-18: qty 768

## 2020-01-18 NOTE — Progress Notes (Signed)
[facility] Progress Note     Subjective: UGI without leak. Feeling good.   Objective: Vital signs in last 24 hours: Temp:  [98.8 F (37.1 C)-99.2 F (37.3 C)] 98.9 F (37.2 C) (06/15 0425) Pulse Rate:  [74-79] 76 (06/15 0425) Resp:  [16-18] 16 (06/15 0425) BP: (110-121)/(71-84) 110/71 (06/15 0425) SpO2:  [93 %-99 %] 93 % (06/15 0425) Last BM Date: 01/17/20  Intake/Output from previous day: 06/14 0701 - 06/15 0700 In: 2011.1 [I.V.:1662.8; IV Piggyback:348.3] Out: 500 [Urine:500] Intake/Output this shift: No intake/output data recorded.  General appearance: alert, cooperative and no distress Resp: normal work of breathing GI: soft, nondistended and nontender  Lab Results:  Recent Labs    01/17/20 0600  WBC 9.0  HGB 11.4*  HCT 37.0*  PLT 203   BMET Recent Labs    01/17/20 0600  NA 132*  K 4.4  CL 101  CO2 22  GLUCOSE 191*  BUN 21  CREATININE 1.48*  CALCIUM 8.9   PT/INR No results for input(s): LABPROT, INR in the last 72 hours.  Studies/Results: DG UGI W SINGLE CM (SOL OR THIN BA)  Result Date: 01/18/2020 CLINICAL DATA:  Inflammation along the duodenum, possible perforated ulcer. EXAM: WATER SOLUBLE UPPER GI SERIES TECHNIQUE: Single-column upper GI series was performed using water soluble contrast. CONTRAST:  Omnipaque 300 COMPARISON:  CT abdomen 01/13/2020 FLUOROSCOPY TIME:  Fluoroscopy Time:  3 minutes, 18 seconds Radiation Exposure Index (if provided by the fluoroscopic device): 55.5 mGy Number of Acquired Spot Images: 2 FINDINGS: Initial KUB demonstrates lumbar spondylosis. No compelling findings of free intraperitoneal gas. The pharyngeal phase of swallowing was not assessed. Expected contour and appearance of the esophagus with some occasional secondary and tertiary contractions of the distal esophagus. Roughly normal gastric morphology. Proximal duodenal fold thickening noted suspicious for duodenitis. In corroboration with the CT  appearance, there are two outpouchings of the descending duodenum thought to probably be duodenal diverticula which may have some degree of inflammation. It is difficult to completely exclude the possibility of contained ulceration, although at least one of these outpouchings has a bulbous well-defined margin medially on image 1/16 more characteristic of a diverticulum. There is no leakage of contrast from either of these two small suspected outpouchings. Moreover, there is thought to be an anterior ulceration in the vicinity of the duodenal bulb, with heaped margins of mucosa along the ulceration shown on image 33/14. No extravasation of contrast. Finally, there is a larger diverticulum of the distal third portion of the duodenum shown on image 79/14. IMPRESSION: 1. Ulceration of the anterior duodenal bulb without visible extravasation/leak at this time. 2. Considerable proximal duodenal fold thickening compatible with duodenitis. 3. At least one and possibly two medial diverticula of the descending duodenum, contained and without visible leak. 4. Larger diverticulum of the distal duodenum, without current inflammatory findings associated with this distal duodenal diverticulum. 5. Today's exam was performed with water-soluble contrast medium, and was optimized to focus on the duodenum where there was greatest concern. Electronically Signed   By: Van Clines M.D.   On: 01/18/2020 09:04    Anti-infectives: Anti-infectives (From admission, onward)   Start     Dose/Rate Route Frequency Ordered Stop   01/13/20 2200  piperacillin-tazobactam (ZOSYN) IVPB 3.375 g     Discontinue     3.375 g 12.5 mL/hr over 240 Minutes Intravenous Every 8 hours 01/13/20 1513     01/13/20 1330  piperacillin-tazobactam (ZOSYN) IVPB 3.375 g  3.375 g 100 mL/hr over 30 Minutes Intravenous  Once 01/13/20 1315 01/13/20 1420   01/13/20 1330  fluconazole (DIFLUCAN) IVPB 400 mg     Discontinue     400 mg 100 mL/hr over 120  Minutes Intravenous Every 24 hours 01/13/20 1326        Assessment/Plan: Mr. Kotowski is a 74 yo with perforated duodenal ulcer doing well. UGI without leak. Clears and adv as tolerated. Would keep on Protonix BID for now. Will need GI to see and do EGD in 6 weeks Will need antibiotics / antifungal for 10 total days but can switch to orals when appropriate Do not need to renew TPN   LOS: 5 days    Virl Cagey 01/18/2020

## 2020-01-18 NOTE — Progress Notes (Signed)
PHARMACY - TOTAL PARENTERAL NUTRITION CONSULT NOTE   Indication: perforated bowel  Patient Measurements: Height: 6' (182.9 cm) Weight: 84.8 kg (186 lb 15.2 oz) IBW/kg (Calculated) : 77.6 TPN AdjBW (KG): 84.5 Body mass index is 25.35 kg/m.  Assessment:  74 yo male with hx of CKD-3, CVA, ischemic cardiomyopathy (EF-25%). He presents with abdominal pain. Surgery consulted. Bowel rest planned and pt unable to tolerate placement of NGT. CT-duodenitis and large peptic ulcer with perforation. Plan to start TPN  Glucose / Insulin: 169-215. 14 units in last 24 hours Electrolytes: Na 132. Other electrolytes WNL Renal: Scr 1.73>>1.48  LFTs / TGs: ALT 54. Others WNL Prealbumin / albumin: 3.5 Intake / Output; MIVF: 1511/500; 1 BM GI Imaging:CT-duodenitis and large peptic ulcer with perforation UGI 6/15  Surgeries / Procedures:   Central access: PICC TPN start date: 01/14/2020  Nutritional Goals (per RD recommendation on 6/21): kCal: 2125-2295, Protein: 68-75  Fluid: > 2 L/day Goal TPN rate is 80 mL/hr (provides 76.8 g of protein and 2188 kcals per day)  Current Nutrition:  NPO  Plan:  Continue TPN to 21mL/hr at 1800 Electrolytes in TPN: 13mEq/L of Na, 62mEq/L of K, 44mEq/L of Ca, 76mEq/L of Mg, and 13mmol/L of Phos. Cl:Ac ratio 1:1. Current TPN providing Prot 76g , CHO 384 g ( 1305 kcal), lipids 576 kcal. Total kcal 2188 kcal. Add standard MVI and trace elements to TPN Add 10 units insulin to TPN Initiate Sensitive q4h SSI and adjust as needed  Monitor TPN labs on Mon/Thurs  Isac Sarna, BS [place], [place] Clinical Pharmacist Pager [phone] 01/18/2020 8:46 AM

## 2020-01-18 NOTE — Plan of Care (Signed)

## 2020-01-18 NOTE — Progress Notes (Signed)
PROGRESS NOTE    Dennis Yu  OIB:[id number] DOB: 18-Oct-1945 DOA: 01/13/2020 PCP: [name], MD   Brief Narrative:  Per HPI: Dennis Yu a 74 y.o.malewith medical history significant forischemic cardiomyopathy with LVEF 25%, type 2 diabetes, hypertension, dyslipidemia, prior CVA, and CKD stage IIIa who presented to the ED with complaints of worsening epigastric abdominal pain that began on Sunday of this week. He cannot describe the pain. He had apparently undergone colonoscopy with snare polypectomy on 6/8 with no complications noted. He did not mention his epigastric abdominal pain to anyone at that time. He states that he has not been eating anything since Sunday as he was scared to exacerbate his pain. His wife confirms this and states that if he had had anything, it may have been some applesauce 1 or 2 days ago. He states that he has not had any bowel movements since his colonoscopy prep. He denies any fevers or chills. He denies any exacerbating or alleviating factors.  -Continues on TPN as well as antibiotics. No significant complaints.    -Upper GI series and small bowel follow-through demonstrated no leakage.  Patient is feeling good; no nausea, no vomiting, no chest pain, no shortness of breath, no abdominal pain.  Advance diet as per general surgery recommendations and stop TPN.  Continue IV antibiotics.   Assessment & Plan:   Active Problems:   Perforated duodenal ulcer ([place])   Epigastric abdominal pain likely secondary to perforated duodenal ulcer with contained fluid collection -No nausea, no vomiting, no abdominal pain.  Patient is afebrile. -continue IV PPI twice daily -Continue on IV Zosyn and fluconazole to cover for perforationfor total 10-day course (day 6/10) -Upper GI/SBFT study has demonstrated no leakage; no surgical intervention planned currently. -Continue intravenous feeding via TPN/PICC line for now; per general surgery plan is to  advance diet today and stop TPN.  Assess for tolerance.  History of ischemic cardiomyopathy with LVEF 25% -Follows with Dr. Domenic Polite in outpatient setting -Hold outpatient medications of aspirin, Coreg, Benicar, Aldactone, and Lasix until he can tolerate orals  -Continue to follow daily weights and strict intake and output -Condition appears to be compensated currently. -No shortness of breath or orthopnea reported.  CKD stage IIIa-stable -Baseline creatinine approximately 1.3-1.7 -continue to follow I's and O's -follow renal function trend and electrolytes.  Chronic iron deficiency anemia-stable -Does not appear to be on iron supplementation -No overt bleeding identified, follow Hgb trend intermittently   Mixed hyperlipidemia -continue holding Zocor for now.  History of CVA -Holding aspirin and statin as well as Coumadin while n.p.o. and pursuing bowel rest. -resume ASA and anticoagulation as per general surgery rec's.  History of colonic polyps -Status post colonoscopy with snare polypectomy on 01/11/2020 with Dr. Gala Romney -continue patient follow-up with GI.  History of hypertension-stable -continue Holding home blood pressure agents for now -IV labetalol as needed for severe elevations  History of type 2 diabetes -Mild hyperglycemia while receiving TPN noted. -Hemoglobin A1c 7.8% -Continue holding oral hypoglycemic agents.   -Maintain on SSI every 4 hours while n.p.o; then substituted to before every meal/nightly and add as needed meal coverage.   DVT prophylaxis:SCDs Code Status:Full code Family Communication:No family at bedside. Disposition Plan:Remains inpatient; following general surgery recommendations will advance diet and stop TPN today.  Patient UGI studies with leakage appreciated.  Status is: Inpatient  Remains inpatient appropriate because:Ongoing diagnostic testing needed not appropriate for outpatient work up and IV treatments appropriate due  to intensity of  illness or inability to take PO   Dispo: The patient is from:Home Anticipated d/c is SN:[facility] Anticipated d/c date is: >2 days Patient currently is not medically stable to d/c, diet will be slowly advanced, TPN discontinue and the patient will be kept on IV antibiotics.  Once able to fully tolerate orals will be able to go home..  Consultants:  General surgery  Procedures:  As noted below  Antimicrobials:  Anti-infectives (From admission, onward)   Start     Dose/Rate Route Frequency Ordered Stop   01/13/20 2200  piperacillin-tazobactam (ZOSYN) IVPB 3.375 g     Discontinue     3.375 g 12.5 mL/hr over 240 Minutes Intravenous Every 8 hours 01/13/20 1513     01/13/20 1330  piperacillin-tazobactam (ZOSYN) IVPB 3.375 g        3.375 g 100 mL/hr over 30 Minutes Intravenous  Once 01/13/20 1315 01/13/20 1420   01/13/20 1330  fluconazole (DIFLUCAN) IVPB 400 mg     Discontinue     400 mg 100 mL/hr over 120 Minutes Intravenous Every 24 hours 01/13/20 1326        Subjective: No events overnight; reports no chest pain, no nausea, no vomiting, no shortness of breath.  Feeling good today.  Objective: Vitals:   01/17/20 2018 01/17/20 2019 01/18/20 0425 01/18/20 1307  BP: 121/73  110/71   Pulse: 79  76 94  Resp: 17  16 20   Temp: 98.8 F (37.1 C)  98.9 F (37.2 C)   TempSrc:   Oral   SpO2: 97% 97% 93% 99%  Weight:      Height:        Intake/Output Summary (Last 24 hours) at 01/18/2020 1837 Last data filed at 01/18/2020 1700 Gross per 24 hour  Intake 1604.37 ml  Output 950 ml  Net 654.37 ml   Filed Weights   01/13/20 0932 01/13/20 1648 01/14/20 1000  Weight: 92.5 kg 84.5 kg 84.8 kg    Examination: General exam: Alert, awake, oriented x 3; no chest pain, no nausea, no vomiting.  Reports no abdominal pain.  He is feeling good.  Patient is afebrile Respiratory system: Clear to auscultation. Respiratory effort  normal. Cardiovascular system:RRR. No murmurs, rubs, gallops. Gastrointestinal system: Abdomen is nondistended, soft and nontender. No organomegaly or masses felt. Normal bowel sounds heard. Central nervous system: Alert and oriented. No focal neurological deficits. Extremities: No no cyanosis or clubbing. Skin: No rashes, no petechiae. Psychiatry: Judgement and insight appear normal. Mood & affect appropriate.    Data Reviewed: I have personally reviewed following labs and imaging studies  CBC: Recent Labs  Lab 01/13/20 0944 01/14/20 0559 01/15/20 0646 01/17/20 0600  WBC 15.2* 12.5* 9.3 9.0  NEUTROABS  --   --  6.4 6.7  HGB 13.3 11.4* 11.4* 11.4*  HCT 42.4 37.6* 36.3* 37.0*  MCV 90.8 92.6 91.2 92.5  PLT 312 266 240 767   Basic Metabolic Panel: Recent Labs  Lab 01/13/20 0944 01/14/20 0559 01/15/20 0646 01/17/20 0600  NA 138 137 137 132*  K 4.5 4.5 4.2 4.4  CL 101 103 104 101  CO2 27 26 24 22   GLUCOSE 169* 137* 154* 191*  BUN 31* 33* 28* 21  CREATININE 1.55* 1.73* 1.47* 1.48*  CALCIUM 10.9* 9.3 9.0 8.9  MG  --  2.0 2.1 2.2  PHOS  --   --  3.0 2.9   GFR: Estimated Creatinine Clearance: 49.5 mL/min (A) (by C-G formula based on SCr of 1.48 mg/dL (H)).  Liver Function Tests: Recent Labs  Lab 01/13/20 0944 01/14/20 0559 01/15/20 0646 01/17/20 0600  AST 26 16 16  34  ALT 41 26 24 54*  ALKPHOS 60 49 47 46  BILITOT 0.7 0.6 0.3 0.3  PROT 8.4* 6.9 6.9 7.1  ALBUMIN 4.2 3.5 3.6 3.5   Recent Labs  Lab 01/13/20 0944  LIPASE 30   Coagulation Profile: Recent Labs  Lab 01/13/20 0944  INR 1.2   Cardiac Enzymes: CBG: Recent Labs  Lab 01/18/20 0030 01/18/20 0426 01/18/20 0717 01/18/20 1056 01/18/20 1605  GLUCAP 224* 215* 181* 162* 161*   Lipid Profile: Recent Labs    01/17/20 0600  TRIG 143   Sepsis Labs: No results for input(s): PROCALCITON, LATICACIDVEN in the last 168 hours.  Recent Results (from the past 240 hour(s))  SARS Coronavirus 2 by RT  PCR (hospital order, performed in [facility] hospital lab) Nasopharyngeal Nasopharyngeal Swab     Status: None   Collection Time: 01/13/20  2:39 PM   Specimen: Nasopharyngeal Swab  Result Value Ref Range Status   SARS Coronavirus 2 NEGATIVE NEGATIVE Final    Comment: (NOTE) SARS-CoV-2 target nucleic acids are NOT DETECTED.  The SARS-CoV-2 RNA is generally detectable in upper and lower respiratory specimens during the acute phase of infection. The lowest concentration of SARS-CoV-2 viral copies this assay can detect is 250 copies / mL. A negative result does not preclude SARS-CoV-2 infection and should not be used as the sole basis for treatment or other patient management decisions.  A negative result may occur with improper specimen collection / handling, submission of specimen other than nasopharyngeal swab, presence of viral mutation(s) within the areas targeted by this assay, and inadequate number of viral copies (<250 copies / mL). A negative result must be combined with clinical observations, patient history, and epidemiological information.  Fact Sheet for Patients:   StrictlyIdeas.no  Fact Sheet for Healthcare Providers: BankingDealers.co.za  This test is not yet approved or  cleared by the [place] FDA and has been authorized for detection and/or diagnosis of SARS-CoV-2 by FDA under an Emergency Use Authorization (EUA).  This EUA will remain in effect (meaning this test can be used) for the duration of the COVID-19 declaration under Section 564(b)(1) of the Act, 21 U.S.C. section 360bbb-3(b)(1), unless the authorization is terminated or revoked sooner.  Performed at [facility], [address] [place], [place] [address]      Radiology Studies: DG UGI W SINGLE CM (SOL OR THIN BA)  Result Date: 01/18/2020 CLINICAL DATA:  Inflammation along the duodenum, possible perforated ulcer. EXAM: WATER SOLUBLE UPPER GI SERIES  TECHNIQUE: Single-column upper GI series was performed using water soluble contrast. CONTRAST:  Omnipaque 300 COMPARISON:  CT abdomen 01/13/2020 FLUOROSCOPY TIME:  Fluoroscopy Time:  3 minutes, 18 seconds Radiation Exposure Index (if provided by the fluoroscopic device): 55.5 mGy Number of Acquired Spot Images: 2 FINDINGS: Initial KUB demonstrates lumbar spondylosis. No compelling findings of free intraperitoneal gas. The pharyngeal phase of swallowing was not assessed. Expected contour and appearance of the esophagus with some occasional secondary and tertiary contractions of the distal esophagus. Roughly normal gastric morphology. Proximal duodenal fold thickening noted suspicious for duodenitis. In corroboration with the CT appearance, there are two outpouchings of the descending duodenum thought to probably be duodenal diverticula which may have some degree of inflammation. It is difficult to completely exclude the possibility of contained ulceration, although at least one of these outpouchings has a bulbous well-defined margin medially on  image 1/16 more characteristic of a diverticulum. There is no leakage of contrast from either of these two small suspected outpouchings. Moreover, there is thought to be an anterior ulceration in the vicinity of the duodenal bulb, with heaped margins of mucosa along the ulceration shown on image 33/14. No extravasation of contrast. Finally, there is a larger diverticulum of the distal third portion of the duodenum shown on image 79/14. IMPRESSION: 1. Ulceration of the anterior duodenal bulb without visible extravasation/leak at this time. 2. Considerable proximal duodenal fold thickening compatible with duodenitis. 3. At least one and possibly two medial diverticula of the descending duodenum, contained and without visible leak. 4. Larger diverticulum of the distal duodenum, without current inflammatory findings associated with this distal duodenal diverticulum. 5. Today's  exam was performed with water-soluble contrast medium, and was optimized to focus on the duodenum where there was greatest concern. Electronically Signed   By: Van Clines M.D.   On: 01/18/2020 09:04    Scheduled Meds: . Chlorhexidine Gluconate Cloth  6 each Topical Daily  . insulin aspart  0-9 Units Subcutaneous Q4H  . pantoprazole (PROTONIX) IV  40 mg Intravenous Q12H  . sodium chloride flush  10-40 mL Intracatheter Q12H   Continuous Infusions: . fluconazole (DIFLUCAN) IV 400 mg (01/18/20 1311)  . piperacillin-tazobactam (ZOSYN)  IV 3.375 g (01/18/20 1559)  . TPN ADULT (ION) 80 mL/hr at 01/18/20 1807     LOS: 5 days    Time spent: 30 minutes    Barton Dubois, MD [facility]  If 7PM-7AM, please contact night-coverage [email or web address] 01/18/2020, 6:37 PM

## 2020-01-19 LAB — GLUCOSE, CAPILLARY
Glucose-Capillary: 150 mg/dL — ABNORMAL HIGH (ref 70–99)
Glucose-Capillary: 200 mg/dL — ABNORMAL HIGH (ref 70–99)
Glucose-Capillary: 205 mg/dL — ABNORMAL HIGH (ref 70–99)
Glucose-Capillary: 216 mg/dL — ABNORMAL HIGH (ref 70–99)
Glucose-Capillary: 236 mg/dL — ABNORMAL HIGH (ref 70–99)
Glucose-Capillary: 245 mg/dL — ABNORMAL HIGH (ref 70–99)

## 2020-01-19 MED ORDER — DIPHENHYDRAMINE HCL 25 MG PO CAPS
25.0000 mg | ORAL_CAPSULE | Freq: Four times a day (QID) | ORAL | Status: DC | PRN
  Administered 2020-01-19: 25 mg via ORAL
  Filled 2020-01-19: qty 1

## 2020-01-19 MED ORDER — CAMPHOR-MENTHOL 0.5-0.5 % EX LOTN
TOPICAL_LOTION | CUTANEOUS | Status: DC | PRN
  Filled 2020-01-19: qty 222

## 2020-01-19 MED ORDER — TRAVASOL 10 % IV SOLN
INTRAVENOUS | Status: DC
  Filled 2020-01-19: qty 768

## 2020-01-19 NOTE — Progress Notes (Signed)
PROGRESS NOTE    Dennis Yu  FTD:[id number] DOB: 27-Oct-1945 DOA: 01/13/2020 PCP: [name], MD   Brief Narrative:  Per HPI: Dennis Yu a 74 y.o.malewith medical history significant forischemic cardiomyopathy with LVEF 25%, type 2 diabetes, hypertension, dyslipidemia, prior CVA, and CKD stage IIIa who presented to the ED with complaints of worsening epigastric abdominal pain that began on Sunday of this week. He cannot describe the pain. He had apparently undergone colonoscopy with snare polypectomy on 6/8 with no complications noted. He did not mention his epigastric abdominal pain to anyone at that time. He states that he has not been eating anything since Sunday as he was scared to exacerbate his pain. His wife confirms this and states that if he had had anything, it may have been some applesauce 1 or 2 days ago. He states that he has not had any bowel movements since his colonoscopy prep. He denies any fevers or chills. He denies any exacerbating or alleviating factors.  -Continues on TPN as well as antibiotics. No significant complaints.    -Upper GI series and small bowel follow-through demonstrated no leakage.  Patient is feeling good; no nausea, no vomiting, no chest pain, no shortness of breath, no abdominal pain.  Plan to continue current IV antibiotics.  Advance diet to full liquid today and discontinue TPN.  Anticipate discharge in the next 1 day if tolerating well.  He will need follow-up with GI in the outpatient setting.  Assessment & Plan:   Active Problems:   Perforated duodenal ulcer ([place])   Epigastric abdominal pain likely secondary to perforated duodenal ulcer with contained fluid collection -No nausea, no vomiting, no abdominal pain.  Patient is afebrile. -continue IV PPI twice daily for now discharged with oral PPI -Continue on IV Zosyn and fluconazole to cover for perforationfor total 10-day course (day 7/10) -Upper GI/SBFT study has  demonstrated no leakage; no surgical intervention planned currently. -Discontinue TPN feeding and start full liquid diet.  We will plan to discontinue PICC line by discharge.  History of ischemic cardiomyopathy with LVEF 25% -Follows with Dr. Domenic Polite in outpatient setting -Hold outpatient medications of aspirin, Coreg, Benicar, Aldactone, and Lasix until he can tolerate orals  -Continue to follow daily weights and strict intake and output -Condition appears to be compensated currently. -No shortness of breath or orthopnea reported.  CKD stage IIIa-stable -Baseline creatinine approximately 1.3-1.7 -continue to follow I's and O's -follow renal function trend and electrolytes.  Chronic iron deficiency anemia-stable -Does not appear to be on iron supplementation -No overt bleeding identified, follow Hgb trend intermittently   Mixed hyperlipidemia -continue holding Zocor for now.  History of CVA -Holding aspirin and statin as well as Coumadin while n.p.o. and pursuing bowel rest. -resume ASA and anticoagulation as per general surgery rec's.  History of colonic polyps -Status post colonoscopy with snare polypectomy on 01/11/2020 with Dr. Gala Romney -continue patient follow-up with GI.  History of hypertension-stable -continue Holding home blood pressure agents for now -IV labetalol as needed for severe elevations  History of type 2 diabetes -Mild hyperglycemia while receiving TPN noted. -Hemoglobin A1c 7.8% -Continue holding oral hypoglycemic agents.   -Maintain on SSI every 4 hours while n.p.o; then substituted to before every meal/nightly and add as needed meal coverage.   DVT prophylaxis:SCDs Code Status:Full code Family Communication:No family at bedside. Disposition Plan:Remains inpatient; following general surgery recommendations will advance diet and stop TPN today.  Patient UGI studies with leakage appreciated.  Status is: Inpatient  Remains inpatient  appropriate because:Ongoing diagnostic testing needed not appropriate for outpatient work up and IV treatments appropriate due to intensity of illness or inability to take PO   Dispo: The patient is from:Home Anticipated d/c is XL:[facility] Anticipated d/c date is: 1 day Patient currently is not medically stable to d/c, diet advanced to full liquid today.  TPN discontinued.  If tolerating, may be a candidate for discharge in a.m.  Consultants:  General surgery  Procedures:  As noted below  Antimicrobials:  Anti-infectives (From admission, onward)   Start     Dose/Rate Route Frequency Ordered Stop   01/13/20 2200  piperacillin-tazobactam (ZOSYN) IVPB 3.375 g     Discontinue     3.375 g 12.5 mL/hr over 240 Minutes Intravenous Every 8 hours 01/13/20 1513     01/13/20 1330  piperacillin-tazobactam (ZOSYN) IVPB 3.375 g        3.375 g 100 mL/hr over 30 Minutes Intravenous  Once 01/13/20 1315 01/13/20 1420   01/13/20 1330  fluconazole (DIFLUCAN) IVPB 400 mg     Discontinue     400 mg 100 mL/hr over 120 Minutes Intravenous Every 24 hours 01/13/20 1326         Subjective: Patient seen and evaluated today with no new acute complaints or concerns. No acute concerns or events noted overnight.  Tolerating clear liquid diet.  Objective: Vitals:   01/18/20 1307 01/18/20 2004 01/18/20 2116 01/19/20 0416  BP:  (!) 128/93  137/74  Pulse: 94 87  89  Resp: 20     Temp:  99 F (37.2 C)  98.6 F (37 C)  TempSrc:      SpO2: 99% 97% 97% 96%  Weight:      Height:        Intake/Output Summary (Last 24 hours) at 01/19/2020 1233 Last data filed at 01/19/2020 1002 Gross per 24 hour  Intake 1948.36 ml  Output 1450 ml  Net 498.36 ml   Filed Weights   01/13/20 0932 01/13/20 1648 01/14/20 1000  Weight: 92.5 kg 84.5 kg 84.8 kg    Examination:  General exam: Appears calm and comfortable  Respiratory system: Clear to auscultation. Respiratory  effort normal. Cardiovascular system: S1 & S2 heard, RRR. No JVD, murmurs, rubs, gallops or clicks. No pedal edema. Gastrointestinal system: Abdomen is nondistended, soft and nontender. No organomegaly or masses felt. Normal bowel sounds heard. Central nervous system: Alert and oriented. No focal neurological deficits. Extremities: Symmetric 5 x 5 power. Skin: No rashes, lesions or ulcers Psychiatry: Judgement and insight appear normal. Mood & affect appropriate.     Data Reviewed: I have personally reviewed following labs and imaging studies  CBC: Recent Labs  Lab 01/13/20 0944 01/14/20 0559 01/15/20 0646 01/17/20 0600  WBC 15.2* 12.5* 9.3 9.0  NEUTROABS  --   --  6.4 6.7  HGB 13.3 11.4* 11.4* 11.4*  HCT 42.4 37.6* 36.3* 37.0*  MCV 90.8 92.6 91.2 92.5  PLT 312 266 240 102   Basic Metabolic Panel: Recent Labs  Lab 01/13/20 0944 01/14/20 0559 01/15/20 0646 01/17/20 0600  NA 138 137 137 132*  K 4.5 4.5 4.2 4.4  CL 101 103 104 101  CO2 27 26 24 22   GLUCOSE 169* 137* 154* 191*  BUN 31* 33* 28* 21  CREATININE 1.55* 1.73* 1.47* 1.48*  CALCIUM 10.9* 9.3 9.0 8.9  MG  --  2.0 2.1 2.2  PHOS  --   --  3.0 2.9   GFR: Estimated Creatinine Clearance: 49.5  mL/min (A) (by C-G formula based on SCr of 1.48 mg/dL (H)). Liver Function Tests: Recent Labs  Lab 01/13/20 0944 01/14/20 0559 01/15/20 0646 01/17/20 0600  AST 26 16 16  34  ALT 41 26 24 54*  ALKPHOS 60 49 47 46  BILITOT 0.7 0.6 0.3 0.3  PROT 8.4* 6.9 6.9 7.1  ALBUMIN 4.2 3.5 3.6 3.5   Recent Labs  Lab 01/13/20 0944  LIPASE 30   No results for input(s): AMMONIA in the last 168 hours. Coagulation Profile: Recent Labs  Lab 01/13/20 0944  INR 1.2   Cardiac Enzymes: No results for input(s): CKTOTAL, CKMB, CKMBINDEX, TROPONINI in the last 168 hours. BNP (last 3 results) No results for input(s): PROBNP in the last 8760 hours. HbA1C: No results for input(s): HGBA1C in the last 72 hours. CBG: Recent Labs  Lab  01/18/20 2006 01/19/20 0001 01/19/20 0414 01/19/20 0801 01/19/20 1122  GLUCAP 170* 205* 236* 200* 216*   Lipid Profile: Recent Labs    01/17/20 0600  TRIG 143   Thyroid Function Tests: No results for input(s): TSH, T4TOTAL, FREET4, T3FREE, THYROIDAB in the last 72 hours. Anemia Panel: No results for input(s): VITAMINB12, FOLATE, FERRITIN, TIBC, IRON, RETICCTPCT in the last 72 hours. Sepsis Labs: No results for input(s): PROCALCITON, LATICACIDVEN in the last 168 hours.  Recent Results (from the past 240 hour(s))  SARS Coronavirus 2 by RT PCR (hospital order, performed in [facility] hospital lab) Nasopharyngeal Nasopharyngeal Swab     Status: None   Collection Time: 01/13/20  2:39 PM   Specimen: Nasopharyngeal Swab  Result Value Ref Range Status   SARS Coronavirus 2 NEGATIVE NEGATIVE Final    Comment: (NOTE) SARS-CoV-2 target nucleic acids are NOT DETECTED.  The SARS-CoV-2 RNA is generally detectable in upper and lower respiratory specimens during the acute phase of infection. The lowest concentration of SARS-CoV-2 viral copies this assay can detect is 250 copies / mL. A negative result does not preclude SARS-CoV-2 infection and should not be used as the sole basis for treatment or other patient management decisions.  A negative result may occur with improper specimen collection / handling, submission of specimen other than nasopharyngeal swab, presence of viral mutation(s) within the areas targeted by this assay, and inadequate number of viral copies (<250 copies / mL). A negative result must be combined with clinical observations, patient history, and epidemiological information.  Fact Sheet for Patients:   StrictlyIdeas.no  Fact Sheet for Healthcare Providers: BankingDealers.co.za  This test is not yet approved or  cleared by the [place] FDA and has been authorized for detection and/or diagnosis of SARS-CoV-2  by FDA under an Emergency Use Authorization (EUA).  This EUA will remain in effect (meaning this test can be used) for the duration of the COVID-19 declaration under Section 564(b)(1) of the Act, 21 U.S.C. section 360bbb-3(b)(1), unless the authorization is terminated or revoked sooner.  Performed at [facility], [address] [place], [place] [address]          Radiology Studies: DG UGI W SINGLE CM (SOL OR THIN BA)  Result Date: 01/18/2020 CLINICAL DATA:  Inflammation along the duodenum, possible perforated ulcer. EXAM: WATER SOLUBLE UPPER GI SERIES TECHNIQUE: Single-column upper GI series was performed using water soluble contrast. CONTRAST:  Omnipaque 300 COMPARISON:  CT abdomen 01/13/2020 FLUOROSCOPY TIME:  Fluoroscopy Time:  3 minutes, 18 seconds Radiation Exposure Index (if provided by the fluoroscopic device): 55.5 mGy Number of Acquired Spot Images: 2 FINDINGS: Initial KUB demonstrates lumbar  spondylosis. No compelling findings of free intraperitoneal gas. The pharyngeal phase of swallowing was not assessed. Expected contour and appearance of the esophagus with some occasional secondary and tertiary contractions of the distal esophagus. Roughly normal gastric morphology. Proximal duodenal fold thickening noted suspicious for duodenitis. In corroboration with the CT appearance, there are two outpouchings of the descending duodenum thought to probably be duodenal diverticula which may have some degree of inflammation. It is difficult to completely exclude the possibility of contained ulceration, although at least one of these outpouchings has a bulbous well-defined margin medially on image 1/16 more characteristic of a diverticulum. There is no leakage of contrast from either of these two small suspected outpouchings. Moreover, there is thought to be an anterior ulceration in the vicinity of the duodenal bulb, with heaped margins of mucosa along the ulceration shown on image 33/14. No  extravasation of contrast. Finally, there is a larger diverticulum of the distal third portion of the duodenum shown on image 79/14. IMPRESSION: 1. Ulceration of the anterior duodenal bulb without visible extravasation/leak at this time. 2. Considerable proximal duodenal fold thickening compatible with duodenitis. 3. At least one and possibly two medial diverticula of the descending duodenum, contained and without visible leak. 4. Larger diverticulum of the distal duodenum, without current inflammatory findings associated with this distal duodenal diverticulum. 5. Today's exam was performed with water-soluble contrast medium, and was optimized to focus on the duodenum where there was greatest concern. Electronically Signed   By: Van Clines M.D.   On: 01/18/2020 09:04        Scheduled Meds: . Chlorhexidine Gluconate Cloth  6 each Topical Daily  . insulin aspart  0-9 Units Subcutaneous Q4H  . pantoprazole (PROTONIX) IV  40 mg Intravenous Q12H  . sodium chloride flush  10-40 mL Intracatheter Q12H   Continuous Infusions: . fluconazole (DIFLUCAN) IV Stopped (01/18/20 1511)  . piperacillin-tazobactam (ZOSYN)  IV 3.375 g (01/19/20 0506)  . TPN ADULT (ION) 80 mL/hr at 01/19/20 0506     LOS: 6 days    Time spent: 30 minutes    Jeb Schloemer Darleen Crocker, DO [facility]  If 7PM-7AM, please contact night-coverage [email or web address] 01/19/2020, 12:33 PM

## 2020-01-19 NOTE — Progress Notes (Signed)
Nutrition Follow-up  DOCUMENTATION CODES:   Not applicable  INTERVENTION:  Magic cup TID with meals, each supplement provides 290 kcal and 9 grams of protein (vanilla)  Advance diet as medically feasible  NUTRITION DIAGNOSIS:   Inadequate oral intake related to inability to eat, acute illness (abdominal pain/ duodenitis - requiring bowel rest) as evidenced by NPO status (surgical consult- pending). Progressing, diet advanced to full liquids  GOAL:   Patient will meet greater than or equal to 90% of their needs Progressing   MONITOR:   I & O's, Weight trends, Diet advancement  REASON FOR ASSESSMENT:   Consult New TPN/TNA  ASSESSMENT:   Patient is a 74 yo male with hx of CKD-3, CVA, ischemic cardiomyopathy (EF-25%). He presents with abdominal pain. Surgery consulted. Bowel rest planned and pt unable to tolerate placement of NGT. TNA pending.  6/15 - UGI and small bowel follow-through without leak, diet advanced, TPN discontinued.  Patient out of bed sitting in chair, wife in room this afternoon. Patient denies abdominal pain, N/V and is tolerating liquid diet. Wife reports that patient had chicken broth for breakfast and did not like it. She states that he is ready for real food. Encouraged patient that diet advanced to [facility] at 0834 allowing for items with more substance. Patient declined Ensure supplement, amenable to vanilla [employer].   I/Os: +4740 ml since admit    +898 ml x 24 hrs UOP: 1050 ml x 24 hrs  Medications reviewed and include: SSI, Protonix Diflucan, Zosyn TPN @ 80 ml/hr (d/c @ 1759) Labs: CBGs 200,236,205,170,161, Na 132 (L), Cr 1.48 (H) K/Mg/P - WNL Lab Results  Component Value Date   HGBA1C 7.8 (H) 01/13/2020    Diet Order:   Diet Order            Diet full liquid Room service appropriate? Yes; Fluid consistency: Thin  Diet effective now                 EDUCATION NEEDS:   Not appropriate for education at this time  Skin:  Skin Assessment:  Reviewed RN Assessment  Last BM:  6/16 type 7  Height:   Ht Readings from Last 1 Encounters:  01/13/20 6' (1.829 m)    Weight:   Wt Readings from Last 1 Encounters:  01/14/20 84.8 kg    BMI:  Body mass index is 25.35 kg/m.  Estimated Nutritional Needs:   Kcal:  2125-2295  Protein:  68-75 gr  Fluid:  >2 liters daily   Lajuan Lines, RD, LDN Clinical Nutrition After Hours/Weekend Pager # in [place]

## 2020-01-19 NOTE — Progress Notes (Signed)
Patient found to have raised red rash looking area on inside of bilateral arms, abdomen, and chest. Patient reports that it itches. Dr. Manuella Ghazi made aware via text page and ordered PRN benadryl.

## 2020-01-19 NOTE — Plan of Care (Signed)

## 2020-01-19 NOTE — Progress Notes (Signed)
Pharmacy Antibiotic Note  Dennis Yu is a 74 y.o. male admitted on 01/13/2020 with intra abdominal infection.  Pharmacy has been consulted for zosyn dosing.  Plan: Zosyn 3.375g IV q8h (4 hour infusion).  Plan for 10 days of zosyn/fluconazole Monitor labs, c/s, and patient improvement.  Height: 6' (182.9 cm) Weight: 84.8 kg (186 lb 15.2 oz) IBW/kg (Calculated) : 77.6  Temp (24hrs), Avg:98.8 F (37.1 C), Min:98.6 F (37 C), Max:99 F (37.2 C)  Recent Labs  Lab 01/13/20 0944 01/14/20 0559 01/15/20 0646 01/17/20 0600  WBC 15.2* 12.5* 9.3 9.0  CREATININE 1.55* 1.73* 1.47* 1.48*    Estimated Creatinine Clearance: 49.5 mL/min (A) (by C-G formula based on SCr of 1.48 mg/dL (H)).    No Known Allergies  Antimicrobials this admission: 6/10 zosyn >>  6/10 fluconzole >>   Microbiology results: 6/10 Covid 19: negative  Thank you for allowing pharmacy to be a part of this patient's care.  Ramond Craver 01/19/2020 8:52 AM

## 2020-01-19 NOTE — Progress Notes (Signed)
[facility] Progress Note     Subjective: No major issues. Tolerating clears.   Objective: Vital signs in last 24 hours: Temp:  [98.6 F (37 C)-99 F (37.2 C)] 98.6 F (37 C) (06/16 0416) Pulse Rate:  [87-94] 89 (06/16 0416) Resp:  [20] 20 (06/15 1307) BP: (128-137)/(74-93) 137/74 (06/16 0416) SpO2:  [96 %-99 %] 96 % (06/16 0416) Last BM Date: 01/18/20  Intake/Output from previous day: 06/15 0701 - 06/16 0700 In: 1948.4 [P.O.:720; I.V.:878.4; IV Piggyback:350] Out: 1050 [Urine:1050] Intake/Output this shift: No intake/output data recorded.  General appearance: alert, cooperative, and no distress GI: soft, non-tender; bowel sounds normal; no masses,  no organomegaly  Lab Results:  Recent Labs    01/17/20 0600  WBC 9.0  HGB 11.4*  HCT 37.0*  PLT 203   BMET Recent Labs    01/17/20 0600  NA 132*  K 4.4  CL 101  CO2 22  GLUCOSE 191*  BUN 21  CREATININE 1.48*  CALCIUM 8.9   PT/INR No results for input(s): LABPROT, INR in the last 72 hours.  Studies/Results: DG UGI W SINGLE CM (SOL OR THIN BA)  Result Date: 01/18/2020 CLINICAL DATA:  Inflammation along the duodenum, possible perforated ulcer. EXAM: WATER SOLUBLE UPPER GI SERIES TECHNIQUE: Single-column upper GI series was performed using water soluble contrast. CONTRAST:  Omnipaque 300 COMPARISON:  CT abdomen 01/13/2020 FLUOROSCOPY TIME:  Fluoroscopy Time:  3 minutes, 18 seconds Radiation Exposure Index (if provided by the fluoroscopic device): 55.5 mGy Number of Acquired Spot Images: 2 FINDINGS: Initial KUB demonstrates lumbar spondylosis. No compelling findings of free intraperitoneal gas. The pharyngeal phase of swallowing was not assessed. Expected contour and appearance of the esophagus with some occasional secondary and tertiary contractions of the distal esophagus. Roughly normal gastric morphology. Proximal duodenal fold thickening noted suspicious for duodenitis. In corroboration with the  CT appearance, there are two outpouchings of the descending duodenum thought to probably be duodenal diverticula which may have some degree of inflammation. It is difficult to completely exclude the possibility of contained ulceration, although at least one of these outpouchings has a bulbous well-defined margin medially on image 1/16 more characteristic of a diverticulum. There is no leakage of contrast from either of these two small suspected outpouchings. Moreover, there is thought to be an anterior ulceration in the vicinity of the duodenal bulb, with heaped margins of mucosa along the ulceration shown on image 33/14. No extravasation of contrast. Finally, there is a larger diverticulum of the distal third portion of the duodenum shown on image 79/14. IMPRESSION: 1. Ulceration of the anterior duodenal bulb without visible extravasation/leak at this time. 2. Considerable proximal duodenal fold thickening compatible with duodenitis. 3. At least one and possibly two medial diverticula of the descending duodenum, contained and without visible leak. 4. Larger diverticulum of the distal duodenum, without current inflammatory findings associated with this distal duodenal diverticulum. 5. Today's exam was performed with water-soluble contrast medium, and was optimized to focus on the duodenum where there was greatest concern. Electronically Signed   By: Van Clines M.D.   On: 01/18/2020 09:04    Anti-infectives: Anti-infectives (From admission, onward)    Start     Dose/Rate Route Frequency Ordered Stop   01/13/20 2200  piperacillin-tazobactam (ZOSYN) IVPB 3.375 g     Discontinue     3.375 g 12.5 mL/hr over 240 Minutes Intravenous Every 8 hours 01/13/20 1513     01/13/20 1330  piperacillin-tazobactam (ZOSYN) IVPB 3.375 g  3.375 g 100 mL/hr over 30 Minutes Intravenous  Once 01/13/20 1315 01/13/20 1420   01/13/20 1330  fluconazole (DIFLUCAN) IVPB 400 mg     Discontinue     400 mg 100 mL/hr over  120 Minutes Intravenous Every 24 hours 01/13/20 1326         Assessment/Plan: Dennis Yu is doing well and healing from his perforated ulcer. UGI good yesterday. Adv diet to full liquids. Would sat on full /soft food for next 2 weeks. Needs EGD in 6+weeks to assess. Protonix BID. Finish up 10 day course antibiotics/ antifungal, can transition to orals as needed.   LOS: 6 days    Virl Cagey 01/19/2020

## 2020-01-19 NOTE — Progress Notes (Deleted)
PHARMACY - TOTAL PARENTERAL NUTRITION CONSULT NOTE   Indication: perforated bowel  Patient Measurements: Height: 6' (182.9 cm) Weight: 84.8 kg (186 lb 15.2 oz) IBW/kg (Calculated) : 77.6 TPN AdjBW (KG): 84.5 Body mass index is 25.35 kg/m.  Assessment:  74 yo male with hx of CKD-3, CVA, ischemic cardiomyopathy (EF-25%). He presents with abdominal pain. Surgery consulted. Bowel rest planned and pt unable to tolerate placement of NGT. CT-duodenitis and large peptic ulcer with perforation. Plan to start TPN  Glucose / Insulin: 161-236. 14 units in last 24 hours Electrolytes: Na 132. Other electrolytes WNL Renal: Scr 1.73>>1.48  LFTs / TGs: ALT 54. Others WNL Prealbumin / albumin: 3.5 Intake / Output; MIVF: 1511/500; 1 BM GI Imaging:CT-duodenitis and large peptic ulcer with perforation UGI 6/15  Surgeries / Procedures:   Central access: PICC TPN start date: 01/14/2020  Nutritional Goals (per RD recommendation on 6/21): kCal: 2125-2295, Protein: 68-75  Fluid: > 2 L/day Goal TPN rate is 80 mL/hr (provides 76.8 g of protein and 2188 kcals per day)  Current Nutrition:  NPO  Plan:  Continue TPN to 21mL/hr at 1800 Electrolytes in TPN: 25mEq/L of Na, 8mEq/L of K, 69mEq/L of Ca, 41mEq/L of Mg, and 60mmol/L of Phos. Cl:Ac ratio 1:1. Current TPN providing Prot 76g , CHO 384 g ( 1305 kcal), lipids 576 kcal. Total kcal 2188 kcal. Add standard MVI and trace elements to TPN Add 15 units insulin to TPN Initiate Sensitive q4h SSI and adjust as needed  Monitor TPN labs on Mon/Thurs  Margot Ables, PharmD Clinical Pharmacist 01/19/2020 8:48 AM

## 2020-01-19 NOTE — Care Management Important Message (Signed)
Important Message  Patient Details  Name: Dennis Yu MRN: [id number] Date of Birth: [birth year]   Medicare Important Message Given:  Yes     Tommy Medal 01/19/2020, 10:12 AM

## 2020-01-20 LAB — GLUCOSE, CAPILLARY
Glucose-Capillary: 119 mg/dL — ABNORMAL HIGH (ref 70–99)
Glucose-Capillary: 138 mg/dL — ABNORMAL HIGH (ref 70–99)
Glucose-Capillary: 143 mg/dL — ABNORMAL HIGH (ref 70–99)
Glucose-Capillary: 151 mg/dL — ABNORMAL HIGH (ref 70–99)
Glucose-Capillary: 167 mg/dL — ABNORMAL HIGH (ref 70–99)
Glucose-Capillary: 80 mg/dL (ref 70–99)

## 2020-01-20 LAB — CBC
HCT: 40.4 % (ref 39.0–52.0)
Hemoglobin: 12.3 g/dL — ABNORMAL LOW (ref 13.0–17.0)
MCH: 27.9 pg (ref 26.0–34.0)
MCHC: 30.4 g/dL (ref 30.0–36.0)
MCV: 91.6 fL (ref 80.0–100.0)
Platelets: 191 10*3/uL (ref 150–400)
RBC: 4.41 MIL/uL (ref 4.22–5.81)
RDW: 11.7 % (ref 11.5–15.5)
WBC: 10.4 10*3/uL (ref 4.0–10.5)
nRBC: 0 % (ref 0.0–0.2)

## 2020-01-20 LAB — COMPREHENSIVE METABOLIC PANEL
ALT: 74 U/L — ABNORMAL HIGH (ref 0–44)
AST: 32 U/L (ref 15–41)
Albumin: 3.7 g/dL (ref 3.5–5.0)
Alkaline Phosphatase: 56 U/L (ref 38–126)
Anion gap: 9 (ref 5–15)
BUN: 20 mg/dL (ref 8–23)
CO2: 23 mmol/L (ref 22–32)
Calcium: 9.2 mg/dL (ref 8.9–10.3)
Chloride: 104 mmol/L (ref 98–111)
Creatinine, Ser: 1.59 mg/dL — ABNORMAL HIGH (ref 0.61–1.24)
GFR calc Af Amer: 50 mL/min — ABNORMAL LOW (ref >60)
GFR calc non Af Amer: 43 mL/min — ABNORMAL LOW (ref >60)
Glucose, Bld: 141 mg/dL — ABNORMAL HIGH (ref 70–99)
Potassium: 5.6 mmol/L — ABNORMAL HIGH (ref 3.5–5.1)
Sodium: 136 mmol/L (ref 135–145)
Total Bilirubin: 0.5 mg/dL (ref 0.3–1.2)
Total Protein: 7.6 g/dL (ref 6.5–8.1)

## 2020-01-20 LAB — MAGNESIUM: Magnesium: 2 mg/dL (ref 1.7–2.4)

## 2020-01-20 LAB — PHOSPHORUS: Phosphorus: 2.8 mg/dL (ref 2.5–4.6)

## 2020-01-20 MED ORDER — INSULIN ASPART 100 UNIT/ML ~~LOC~~ SOLN
0.0000 [IU] | Freq: Three times a day (TID) | SUBCUTANEOUS | Status: DC
  Administered 2020-01-20 – 2020-01-21 (×3): 2 [IU] via SUBCUTANEOUS

## 2020-01-20 MED ORDER — SODIUM ZIRCONIUM CYCLOSILICATE 10 G PO PACK: 10.0000 g | PACK | Freq: Once | ORAL | Status: DC

## 2020-01-20 MED ORDER — WARFARIN SODIUM 5 MG PO TABS: 5.0000 mg | ORAL_TABLET | ORAL | Status: DC

## 2020-01-20 MED ORDER — GABAPENTIN 300 MG PO CAPS
300.0000 mg | ORAL_CAPSULE | Freq: Every day | ORAL | Status: DC
  Administered 2020-01-20 – 2020-01-21 (×2): 300 mg via ORAL
  Filled 2020-01-20 (×2): qty 1

## 2020-01-20 MED ORDER — SIMVASTATIN 20 MG PO TABS
40.0000 mg | ORAL_TABLET | Freq: Every day | ORAL | Status: DC
  Administered 2020-01-20: 40 mg via ORAL
  Filled 2020-01-20: qty 2

## 2020-01-20 MED ORDER — WARFARIN - PHARMACIST DOSING INPATIENT: Freq: Every day | Status: DC

## 2020-01-20 MED ORDER — SPIRONOLACTONE 12.5 MG HALF TABLET: 12.5000 mg | ORAL_TABLET | Freq: Every day | ORAL | Status: DC

## 2020-01-20 MED ORDER — FUROSEMIDE 40 MG PO TABS: 40.0000 mg | ORAL_TABLET | Freq: Every day | ORAL | Status: DC

## 2020-01-20 MED ORDER — WARFARIN SODIUM 5 MG PO TABS
10.0000 mg | ORAL_TABLET | Freq: Once | ORAL | Status: AC
  Administered 2020-01-20: 10 mg via ORAL
  Filled 2020-01-20: qty 2

## 2020-01-20 MED ORDER — ASPIRIN EC 81 MG PO TBEC
81.0000 mg | DELAYED_RELEASE_TABLET | Freq: Every day | ORAL | Status: DC
  Administered 2020-01-20 – 2020-01-21 (×2): 81 mg via ORAL
  Filled 2020-01-20 (×2): qty 1

## 2020-01-20 MED ORDER — SODIUM ZIRCONIUM CYCLOSILICATE 10 G PO PACK
10.0000 g | PACK | Freq: Once | ORAL | Status: AC
  Administered 2020-01-20: 10 g via ORAL
  Filled 2020-01-20: qty 1

## 2020-01-20 MED ORDER — SPIRONOLACTONE 25 MG PO TABS
12.5000 mg | ORAL_TABLET | Freq: Every day | ORAL | Status: DC
  Administered 2020-01-20: 12.5 mg via ORAL
  Filled 2020-01-20 (×2): qty 0.5
  Filled 2020-01-20: qty 1
  Filled 2020-01-20: qty 0.5
  Filled 2020-01-20: qty 1
  Filled 2020-01-20 (×2): qty 0.5

## 2020-01-20 MED ORDER — GABAPENTIN 300 MG PO CAPS: 300.0000 mg | ORAL_CAPSULE | ORAL | Status: DC

## 2020-01-20 MED ORDER — FUROSEMIDE 40 MG PO TABS
40.0000 mg | ORAL_TABLET | Freq: Every day | ORAL | Status: DC
  Administered 2020-01-20: 40 mg via ORAL
  Filled 2020-01-20 (×2): qty 1

## 2020-01-20 MED ORDER — INSULIN ASPART 100 UNIT/ML ~~LOC~~ SOLN: 0.0000 [IU] | Freq: Every day | SUBCUTANEOUS | Status: DC

## 2020-01-20 MED ORDER — CARVEDILOL 12.5 MG PO TABS
12.5000 mg | ORAL_TABLET | Freq: Two times a day (BID) | ORAL | Status: DC
  Administered 2020-01-20: 12.5 mg via ORAL
  Filled 2020-01-20 (×2): qty 1

## 2020-01-20 MED ORDER — CARVEDILOL 12.5 MG PO TABS: 12.5000 mg | ORAL_TABLET | Freq: Two times a day (BID) | ORAL | Status: DC

## 2020-01-20 MED ORDER — GABAPENTIN 300 MG PO CAPS
600.0000 mg | ORAL_CAPSULE | Freq: Every day | ORAL | Status: DC
  Administered 2020-01-20: 600 mg via ORAL
  Filled 2020-01-20: qty 2

## 2020-01-20 NOTE — Progress Notes (Signed)
[facility]  Tolerating diet and having Bms. Feeling ok. Wants to go home. Dr. Manuella Ghazi is diuresing today due to K and hypervolemia from TPN in setting of CHF EF 25%.  BP 125/83 (BP Location: Left Arm)   Pulse 93   Temp 97.7 F (36.5 C) (Oral)   Resp 17   Ht 6' (1.829 m)   Wt 84.8 kg   SpO2 96%   BMI 25.35 kg/m  NAD Normal work breathing Soft, nontender, nondistended   Patient s/p perforated duodenal ulcer. Doing well. UGI with no leak. Home in next 24 hours.  Full liquids for next 2 weeks Protonix BID Complete 10 day course antibiotic/ antifungal Started back on home coumadin/ aspirin. Outpatient EGD Can follow up with me PRN  Curlene Labrum, MD [facility] [address] [place], [place] [phone] (office)

## 2020-01-20 NOTE — Plan of Care (Signed)

## 2020-01-20 NOTE — Progress Notes (Signed)
PROGRESS NOTE    Dennis Yu  RKY:[id number] DOB: 1945/10/18 DOA: 01/13/2020 PCP: [name], MD   Brief Narrative:  Per HPI: Dennis Yu a 74 y.o.malewith medical history significant forischemic cardiomyopathy with LVEF 25%, type 2 diabetes, hypertension, dyslipidemia, prior CVA, and CKD stage IIIa who presented to the ED with complaints of worsening epigastric abdominal pain that began on Sunday of this week. He cannot describe the pain. He had apparently undergone colonoscopy with snare polypectomy on 6/8 with no complications noted. He did not mention his epigastric abdominal pain to anyone at that time. He states that he has not been eating anything since Sunday as he was scared to exacerbate his pain. His wife confirms this and states that if he had had anything, it may have been some applesauce 1 or 2 days ago. He states that he has not had any bowel movements since his colonoscopy prep. He denies any fevers or chills. He denies any exacerbating or alleviating factors.  -Continues on TPN as well as antibiotics. No significant complaints.  -Upper GI series and small bowel follow-through demonstrated no leakage. Patient is feeling good; no nausea, no vomiting, no chest pain, no shortness of breath, no abdominal pain.  Plan to continue current IV antibiotics.  Continue on full liquid diet.  Lokelma for hyperkalemia which will need to be followed.  Resume home Coumadin as well as blood pressure medications and diuretics and follow.   Assessment & Plan:   Active Problems:   Perforated duodenal ulcer ([place])   Epigastric abdominal pain likely secondary to perforated duodenal ulcer with contained fluid collection -No nausea, no vomiting, no abdominal pain. Patient is afebrile. -continueIV PPI twice daily for now discharged with oral PPI -Continue on IV Zosyn and fluconazole to cover for perforationfor total 10-day course (day8/10) -Upper GI/SBFT studyhas  demonstrated no leakage;no surgical intervention planned currently. -Discontinue TPN feeding and start full liquid diet.  We will plan to discontinue PICC line by discharge.  Hyperkalemia -Lokelma to be given today -Resume home Lasix and Aldactone -Follow labs in a.m.  Abdominal rash -Continue Benadryl and Sarna as prescribed -Appears to be related to chlorhexidine wipes which will be discontinued -Telemetry will be DC'd so patient may shower  History of ischemic cardiomyopathy with LVEF 25% -Follows with Dr. Domenic Polite in outpatient setting -Hold outpatient medications of aspirin, Coreg, Benicar, Aldactone, and Lasix until he can tolerate orals -Continue to follow daily weights and strict intake and output -Condition appears to be compensated currently. -No shortness of breath or orthopnea reported.  CKD stage IIIa-stable -Baseline creatinine approximately 1.3-1.7 -continue to follow I's and O's -follow renal function trend and electrolytes.  Chronic iron deficiency anemia-stable -Does not appear to be on iron supplementation -No overt bleeding identified, followHgb trend intermittently  Mixed hyperlipidemia -Resume statin  History of CVA -Resume home aspirin and statin -Resume Coumadin  History of colonic polyps -Status post colonoscopy with snare polypectomy on 01/11/2020 with Dr. Gala Romney -continuepatient follow-up with GI.  History of hypertension-stable -Resume home blood pressure medications of metoprolol, Lasix, and Aldactone -IV labetalol as needed for severe elevations  History of type 2 diabetes -Hyperglycemia stable -Hemoglobin A1c 7.8% -Continue holding oral hypoglycemic agents. -SSI adjusted to meal coverage   DVT prophylaxis:SCDs, resuming home Coumadin as well Code Status:Full code Family Communication:Discussed with wife at bedside 6/17 Disposition Plan:Remains inpatient while restarting home medications and treating hyperkalemia  today.  Anticipate discharge in the next 1-2 days if stable.  Status is:  Inpatient  Remains inpatient appropriate because:Ongoing diagnostic testing needed not appropriate for outpatient work up and IV treatments appropriate due to intensity of illness or inability to take PO   Dispo: The patient is from:Home Anticipated d/c is VQ:QVZD Anticipated d/c date is: 1 day Patient currently is not medically stable to d/c,diet advanced to full liquid which is being tolerated.  Patient noted to have hyperkalemia that requires treatment and will resume home medications.  Consultants:  General surgery  Procedures:  As noted below  Antimicrobials:  Anti-infectives (From admission, onward)   Start     Dose/Rate Route Frequency Ordered Stop   01/13/20 2200  piperacillin-tazobactam (ZOSYN) IVPB 3.375 g     Discontinue     3.375 g 12.5 mL/hr over 240 Minutes Intravenous Every 8 hours 01/13/20 1513     01/13/20 1330  piperacillin-tazobactam (ZOSYN) IVPB 3.375 g        3.375 g 100 mL/hr over 30 Minutes Intravenous  Once 01/13/20 1315 01/13/20 1420   01/13/20 1330  fluconazole (DIFLUCAN) IVPB 400 mg     Discontinue     400 mg 100 mL/hr over 120 Minutes Intravenous Every 24 hours 01/13/20 1326         Subjective: Patient seen and evaluated today with no new acute complaints or concerns. No acute concerns or events noted overnight.  He appears to be tolerating his full liquid diet with no concerns noted.  He was noted to have an abdominal rash that started yesterday evening and has been associated with some itching.  Objective: Vitals:   01/19/20 1327 01/19/20 2017 01/19/20 2023 01/20/20 0412  BP: 126/78  99/80 125/83  Pulse: 88  88 93  Resp: 17  17   Temp: 98.2 F (36.8 C)  98.5 F (36.9 C) 97.7 F (36.5 C)  TempSrc: Oral  Oral Oral  SpO2: 99% 92% 95% 96%  Weight:      Height:        Intake/Output Summary (Last 24 hours) at  01/20/2020 0946 Last data filed at 01/19/2020 1700 Gross per 24 hour  Intake 660 ml  Output 1100 ml  Net -440 ml   Filed Weights   01/13/20 0932 01/13/20 1648 01/14/20 1000  Weight: 92.5 kg 84.5 kg 84.8 kg    Examination:  General exam: Appears calm and comfortable  Respiratory system: Clear to auscultation. Respiratory effort normal. Cardiovascular system: S1 & S2 heard, RRR. No JVD, murmurs, rubs, gallops or clicks. No pedal edema. Gastrointestinal system: Abdomen is nondistended, soft and nontender. No organomegaly or masses felt. Normal bowel sounds heard.  Rash noted on surface of the abdomen. Central nervous system: Alert and oriented. No focal neurological deficits. Extremities: Symmetric 5 x 5 power. Skin: No rashes, lesions or ulcers Psychiatry: Judgement and insight appear normal. Mood & affect appropriate.     Data Reviewed: I have personally reviewed following labs and imaging studies  CBC: Recent Labs  Lab 01/14/20 0559 01/15/20 0646 01/17/20 0600 01/20/20 0506  WBC 12.5* 9.3 9.0 10.4  NEUTROABS  --  6.4 6.7  --   HGB 11.4* 11.4* 11.4* 12.3*  HCT 37.6* 36.3* 37.0* 40.4  MCV 92.6 91.2 92.5 91.6  PLT 266 240 203 638   Basic Metabolic Panel: Recent Labs  Lab 01/14/20 0559 01/15/20 0646 01/17/20 0600 01/20/20 0506  NA 137 137 132* 136  K 4.5 4.2 4.4 5.6*  CL 103 104 101 104  CO2 26 24 22 23   GLUCOSE 137* 154* 191* 141*  BUN 33* 28* 21 20  CREATININE 1.73* 1.47* 1.48* 1.59*  CALCIUM 9.3 9.0 8.9 9.2  MG 2.0 2.1 2.2 2.0  PHOS  --  3.0 2.9 2.8   GFR: Estimated Creatinine Clearance: 46.1 mL/min (A) (by C-G formula based on SCr of 1.59 mg/dL (H)). Liver Function Tests: Recent Labs  Lab 01/14/20 0559 01/15/20 0646 01/17/20 0600 01/20/20 0506  AST 16 16 34 32  ALT 26 24 54* 74*  ALKPHOS 49 47 46 56  BILITOT 0.6 0.3 0.3 0.5  PROT 6.9 6.9 7.1 7.6  ALBUMIN 3.5 3.6 3.5 3.7   No results for input(s): LIPASE, AMYLASE in the last 168 hours. No  results for input(s): AMMONIA in the last 168 hours. Coagulation Profile: No results for input(s): INR, PROTIME in the last 168 hours. Cardiac Enzymes: No results for input(s): CKTOTAL, CKMB, CKMBINDEX, TROPONINI in the last 168 hours. BNP (last 3 results) No results for input(s): PROBNP in the last 8760 hours. HbA1C: No results for input(s): HGBA1C in the last 72 hours. CBG: Recent Labs  Lab 01/19/20 1556 01/19/20 2025 01/20/20 0049 01/20/20 0414 01/20/20 0812  GLUCAP 245* 150* 119* 143* 138*   Lipid Profile: No results for input(s): CHOL, HDL, LDLCALC, TRIG, CHOLHDL, LDLDIRECT in the last 72 hours. Thyroid Function Tests: No results for input(s): TSH, T4TOTAL, FREET4, T3FREE, THYROIDAB in the last 72 hours. Anemia Panel: No results for input(s): VITAMINB12, FOLATE, FERRITIN, TIBC, IRON, RETICCTPCT in the last 72 hours. Sepsis Labs: No results for input(s): PROCALCITON, LATICACIDVEN in the last 168 hours.  Recent Results (from the past 240 hour(s))  SARS Coronavirus 2 by RT PCR (hospital order, performed in [facility] hospital lab) Nasopharyngeal Nasopharyngeal Swab     Status: None   Collection Time: 01/13/20  2:39 PM   Specimen: Nasopharyngeal Swab  Result Value Ref Range Status   SARS Coronavirus 2 NEGATIVE NEGATIVE Final    Comment: (NOTE) SARS-CoV-2 target nucleic acids are NOT DETECTED.  The SARS-CoV-2 RNA is generally detectable in upper and lower respiratory specimens during the acute phase of infection. The lowest concentration of SARS-CoV-2 viral copies this assay can detect is 250 copies / mL. A negative result does not preclude SARS-CoV-2 infection and should not be used as the sole basis for treatment or other patient management decisions.  A negative result may occur with improper specimen collection / handling, submission of specimen other than nasopharyngeal swab, presence of viral mutation(s) within the areas targeted by this assay, and inadequate  number of viral copies (<250 copies / mL). A negative result must be combined with clinical observations, patient history, and epidemiological information.  Fact Sheet for Patients:   StrictlyIdeas.no  Fact Sheet for Healthcare Providers: BankingDealers.co.za  This test is not yet approved or  cleared by the [place] FDA and has been authorized for detection and/or diagnosis of SARS-CoV-2 by FDA under an Emergency Use Authorization (EUA).  This EUA will remain in effect (meaning this test can be used) for the duration of the COVID-19 declaration under Section 564(b)(1) of the Act, 21 U.S.C. section 360bbb-3(b)(1), unless the authorization is terminated or revoked sooner.  Performed at [facility], [address], [place], [place] [address]          Radiology Studies: No results found.      Scheduled Meds: . aspirin EC  81 mg Oral Daily  . carvedilol  12.5 mg Oral BID WC  . Chlorhexidine Gluconate Cloth  6 each Topical Daily  . furosemide  40 mg Oral Daily  . gabapentin  300 mg Oral Daily  . gabapentin  600 mg Oral QHS  . insulin aspart  0-9 Units Subcutaneous Q4H  . pantoprazole (PROTONIX) IV  40 mg Intravenous Q12H  . simvastatin  40 mg Oral q1800  . sodium chloride flush  10-40 mL Intracatheter Q12H  . sodium zirconium cyclosilicate  10 g Oral Once  . spironolactone  12.5 mg Oral Daily  . warfarin  5-10 mg Oral See admin instructions   Continuous Infusions: . fluconazole (DIFLUCAN) IV Stopped (01/19/20 1538)  . piperacillin-tazobactam (ZOSYN)  IV 3.375 g (01/20/20 0513)     LOS: 7 days    Time spent: 30 minutes    Glori Machnik Darleen Crocker, DO [facility]  If 7PM-7AM, please contact night-coverage [email or web address] 01/20/2020, 9:46 AM

## 2020-01-20 NOTE — Plan of Care (Signed)
  Problem: Education: Goal: Knowledge of General Education information will improve Description: Including pain rating scale, medication(s)/side effects and non-pharmacologic comfort measures Outcome: Progressing   Problem: Clinical Measurements: Goal: Will remain free from infection Outcome: Progressing   

## 2020-01-20 NOTE — Progress Notes (Signed)
ANTICOAGULATION CONSULT NOTE - Initial Consult  Pharmacy Consult for Coumadin Indication: DVT  No Known Allergies  Patient Measurements: Height: 6' (182.9 cm) Weight: 84.8 kg (186 lb 15.2 oz) IBW/kg (Calculated) : 77.6  Vital Signs: Temp: 97.7 F (36.5 C) (06/17 0412) Temp Source: Oral (06/17 0412) BP: 125/83 (06/17 0412) Pulse Rate: 93 (06/17 0412)  Labs: Recent Labs    01/20/20 0506  HGB 12.3*  HCT 40.4  PLT 191  CREATININE 1.59*    Estimated Creatinine Clearance: 46.1 mL/min (A) (by C-G formula based on SCr of 1.59 mg/dL (H)).   Medical History: Past Medical History:  Diagnosis Date  . Bronchitis 11/2012  . Cardiomyopathy    LVEF 25%  . Diabetes mellitus   . Erectile dysfunction   . Essential hypertension   . GERD (gastroesophageal reflux disease)   . History of colonic polyps   . Mixed hyperlipidemia   . 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 Obstructive sleep apnea   . Patent foramen ovale   . Phlebitis   . 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 Superficial phlebitis 12/10/2012  . Type 2 diabetes mellitus ([place])   . Vitamin D deficiency     Medications:  Medications Prior to Admission  Medication Sig Dispense Refill Last Dose  . aspirin 81 MG tablet Take 81 mg by mouth daily.     01/12/2020 at Unknown time  . carvedilol (COREG) 12.5 MG tablet TAKE 1 TABLET TWICE A DAY  WITH MEALS (Patient taking differently: Take 12.5 mg by mouth 2 (two) times daily with a meal. ) 180 tablet 1 01/12/2020 at 1830  . enoxaparin (LOVENOX) 120 MG/0.8ML injection Inject 0.8 mLs (120 mg total) into the skin daily for 10 days. 8 mL 0 01/12/2020 at Unknown time  . furosemide (LASIX) 40 MG tablet Take 40 mg by mouth daily.   Past Week at Unknown time  . gabapentin (NEURONTIN) 300 MG capsule Take 3 capsules (900 mg total) by mouth daily. (Patient taking differently: Take 300-600 mg by mouth See admin instructions. 300 mg during the day, 600 mg at bedtime) 90 capsule 11  01/12/2020 at Unknown time  . glipiZIDE (GLUCOTROL) 5 MG tablet Take 5 mg by mouth daily.    01/10/2020  . NON FORMULARY legatrin pm Daily     Past Week at Unknown time  . olmesartan (BENICAR) 20 MG tablet Take 1 tablet (20 mg total) by mouth daily. 90 tablet 3 01/12/2020 at Unknown time  . Omega-3 Fatty Acids (FISH OIL) 1000 MG CAPS Take 1,000 mg by mouth daily.    Past Week at Unknown time  . PROAIR HFA 108 (90 BASE) MCG/ACT inhaler Inhale 2 puffs into the lungs every 4 (four) hours as needed for wheezing or shortness of breath.    Past Month at Unknown time  . simvastatin (ZOCOR) 40 MG tablet Take 40 mg by mouth daily at 6 PM.    01/12/2020 at Unknown time  . spironolactone (ALDACTONE) 25 MG tablet Take 0.5 tablets (12.5 mg total) by mouth daily. 45 tablet 3 01/12/2020 at Unknown time  . warfarin (JANTOVEN) 5 MG tablet TAKE 1 TABLET DAILY EXCEPT 2 TABLETS ON  FRIDAYS (Patient taking differently: Take 5-10 mg by mouth See admin instructions. TAKE 1 (5 mg) TABLET DAILY EXCEPT 2 TABLETS (10 mg)  ON Mondays and FRIDAYS) 100 tablet 3 01/12/2020 at 0800    Assessment: Patient presented initially with worsening epigastric pain. Abdominal pain  likely secondary to perforated duodenal ulcer . Patient has been NPO since admission and on TPN 6/11. Patient now transitioned to po meds, tolerating. MD asked to restart coumadin.  6/10 INR 1.2 Regimen PTA: Coumadin 10mg  Mon and Fri; 5mg  the rest of the week  Goal of Therapy:  INR 2-3 Monitor platelets by anticoagulation protocol: Yes   Plan:  Coumadin 10mg  po x 1 today PT-INR daily Monitor for s/s of bleeding  Isac Sarna, BS [place], BCPS Clinical Pharmacist Pager [phone] 01/20/2020,10:20 AM

## 2020-01-21 ENCOUNTER — Telehealth: Payer: Self-pay | Admitting: Gastroenterology

## 2020-01-21 LAB — BASIC METABOLIC PANEL
Anion gap: 10 (ref 5–15)
BUN: 30 mg/dL — ABNORMAL HIGH (ref 8–23)
CO2: 24 mmol/L (ref 22–32)
Calcium: 9.1 mg/dL (ref 8.9–10.3)
Chloride: 101 mmol/L (ref 98–111)
Creatinine, Ser: 1.89 mg/dL — ABNORMAL HIGH (ref 0.61–1.24)
GFR calc Af Amer: 40 mL/min — ABNORMAL LOW (ref >60)
GFR calc non Af Amer: 35 mL/min — ABNORMAL LOW (ref >60)
Glucose, Bld: 145 mg/dL — ABNORMAL HIGH (ref 70–99)
Potassium: 4.7 mmol/L (ref 3.5–5.1)
Sodium: 135 mmol/L (ref 135–145)

## 2020-01-21 LAB — GLUCOSE, CAPILLARY
Glucose-Capillary: 154 mg/dL — ABNORMAL HIGH (ref 70–99)
Glucose-Capillary: 169 mg/dL — ABNORMAL HIGH (ref 70–99)

## 2020-01-21 LAB — PROTIME-INR
INR: 1.3 — ABNORMAL HIGH (ref 0.8–1.2)
Prothrombin Time: 15.6 seconds — ABNORMAL HIGH (ref 11.4–15.2)

## 2020-01-21 MED ORDER — AMOXICILLIN-POT CLAVULANATE 875-125 MG PO TABS
1.0000 | ORAL_TABLET | Freq: Two times a day (BID) | ORAL | 0 refills | Status: AC
Start: 2020-01-21 — End: 2020-01-23

## 2020-01-21 MED ORDER — FLUCONAZOLE 100 MG PO TABS
100.0000 mg | ORAL_TABLET | Freq: Every day | ORAL | 0 refills | Status: AC
Start: 2020-01-21 — End: 2020-01-23

## 2020-01-21 MED ORDER — PANTOPRAZOLE SODIUM 40 MG PO TBEC
40.0000 mg | DELAYED_RELEASE_TABLET | Freq: Two times a day (BID) | ORAL | 1 refills | Status: DC
Start: 2020-01-21 — End: 2020-03-06

## 2020-01-21 MED ORDER — ONDANSETRON HCL 4 MG PO TABS: 4.0000 mg | ORAL_TABLET | Freq: Four times a day (QID) | ORAL | 0 refills | Status: DC | PRN

## 2020-01-21 NOTE — Progress Notes (Signed)
[facility]  Reports going home today. Follow up with GI for EGD. Liquid/ soft diet for 2 weeks. Protonix BID. Complete antibiotic course.  Curlene Labrum, MD [facility] [address] [place], [place] [address] [phone] (office)

## 2020-01-21 NOTE — Progress Notes (Signed)
PICC line removed from right upper arm, pressure dressing applied, no bleeding noted, patient tolerated well.  Reviewed AVS with patient and patient's wife, Blanch Media, both verbalized understanding.  Patient to be taken to front lobby via wheelchair and transported home by his sister.

## 2020-01-21 NOTE — Discharge Summary (Signed)
Physician Discharge Summary  Dennis Yu QIW:[id number] DOB: 01/14/1946 DOA: 01/13/2020  PCP: [name], MD  Admit date: 01/13/2020  Discharge date: 01/21/2020  Admitted From:Home  Disposition:  Home  Recommendations for Outpatient Follow-up:  1. Follow up with PCP in 1-2 weeks 2. Please obtain BMP/CBC in one week 3. Follow-up with cardiologist Dr. Domenic Polite in 1-2 weeks to reassess blood pressure and resume home diuretic as well as blood pressure medications as appropriate 4. Follow-up with gastroenterology with EGD that will be scheduled in 6-8 weeks 5. Continue PPI twice daily as prescribed 6. Remain on full liquid diet for 2 weeks and then advance to soft thereafter 7. Remain on Augmentin and fluconazole as prescribed for 2 more days to complete 10-day course of treatment 8. Okay to continue Coumadin and aspirin  Home Health: None  Equipment/Devices: None  Discharge Condition: Stable  CODE STATUS: Full  Diet recommendation: Full liquid diet for 2 weeks and then advance to soft  Brief/Interim Summary: Per HPI: Dennis A Thomasis a 74 y.o.malewith medical history significant forischemic cardiomyopathy with LVEF 25%, type 2 diabetes, hypertension, dyslipidemia, prior CVA, and CKD stage IIIa who presented to the ED with complaints of worsening epigastric abdominal pain that began on Sunday of this week. He cannot describe the pain. He had apparently undergone colonoscopy with snare polypectomy on 6/8 with no complications noted. He did not mention his epigastric abdominal pain to anyone at that time. He states that he has not been eating anything since Sunday as he was scared to exacerbate his pain. His wife confirms this and states that if he had had anything, it may have been some applesauce 1 or 2 days ago. He states that he has not had any bowel movements since his colonoscopy prep. He denies any fevers or chills. He denies any exacerbating or alleviating  factors.  -Patient was admitted with epigastric abdominal pain likely secondary to perforated duodenal ulcer and was maintained on TPN for several days as well as IV Zosyn and fluconazole empirically.  He did have upper GI/SBFT study with no leakage and diet was advanced.  No complications noted this admission and he is stable and eager for discharge today.  Epigastric abdominal pain likely secondary to perforated duodenal ulcer with contained fluid collection -No nausea, no vomiting, no abdominal pain. Patient is afebrile. -continue oral PPI twice daily for now until seen by GI -He will be scheduled for EGD in 6-8 weeks to assess further -Remain on fluconazole and Augmentin as prescribed for 2 more days to complete 10-day course of treatment -Discussed full liquid diet for 2 more weeks and then advance to soft as tolerated  Hyperkalemia -Resolved after Lokelma administered 6/17  Abdominal rash-improved -Continue OTC Benadryl as needed -Likely secondary to chlorhexidine wash cloths  History of ischemic cardiomyopathy with LVEF 25% -Follows with Dr. Domenic Polite in outpatient setting -We will plan to reschedule as multiple medications have been held on account of softer blood pressure readings as well as elevated creatinine levels noted today.  CKD stage IIIa-stable -Baseline creatinine approximately 1.3-1.7 -Creatinine on day of discharge 1.89 -follow renal function trend and electrolytes in 1 week and resume medications as appropriate  Chronic iron deficiency anemia-stable -Plan to hold for now and monitor -May resume after EGD performed outpatient  Mixed hyperlipidemia -Continue statin  History of CVA -Continue aspirin, statin, and Coumadin and follow-up INR in outpatient setting  History of colonic polyps -Status post colonoscopy with snare polypectomy on 01/11/2020 with Dr.  Rourk -continuepatient follow-up with GI.  History of hypertension-stable -Patient began to  have some hypotension with reinitiation of home medications -Plan to hold Coreg, olmesartan, Lasix, and Aldactone for now until seen back by cardiology which will be scheduled  History of type 2 diabetes -Hyperglycemia stable -Hemoglobin A1c 7.8% -Okay to resume home glipizide and monitor carefully  Discharge Diagnoses:  Active Problems:   Perforated duodenal ulcer ([place])    Discharge Instructions  Discharge Instructions    Diet - low sodium heart healthy   Complete by: As directed    Increase activity slowly   Complete by: As directed      Allergies as of 01/21/2020   No Known Allergies     Medication List    STOP taking these medications   carvedilol 12.5 MG tablet Commonly known as: COREG   furosemide 40 MG tablet Commonly known as: LASIX   olmesartan 20 MG tablet Commonly known as: Benicar   spironolactone 25 MG tablet Commonly known as: ALDACTONE     TAKE these medications   amoxicillin-clavulanate 875-125 MG tablet Commonly known as: Augmentin Take 1 tablet by mouth 2 (two) times daily for 2 days.   aspirin 81 MG tablet Take 81 mg by mouth daily.   enoxaparin 120 MG/0.8ML injection Commonly known as: LOVENOX Inject 0.8 mLs (120 mg total) into the skin daily for 10 days.   Fish Oil 1000 MG Caps Take 1,000 mg by mouth daily.   fluconazole 100 MG tablet Commonly known as: Diflucan Take 1 tablet (100 mg total) by mouth daily for 2 days.   gabapentin 300 MG capsule Commonly known as: NEURONTIN Take 3 capsules (900 mg total) by mouth daily. What changed:   how much to take  when to take this  additional instructions   glipiZIDE 5 MG tablet Commonly known as: GLUCOTROL Take 5 mg by mouth daily.   NON FORMULARY legatrin pm Daily   ondansetron 4 MG tablet Commonly known as: ZOFRAN Take 1 tablet (4 mg total) by mouth every 6 (six) hours as needed for nausea.   pantoprazole 40 MG tablet Commonly known as: Protonix Take 1 tablet (40 mg  total) by mouth 2 (two) times daily.   ProAir HFA 108 (90 Base) MCG/ACT inhaler Generic drug: albuterol Inhale 2 puffs into the lungs every 4 (four) hours as needed for wheezing or shortness of breath.   simvastatin 40 MG tablet Commonly known as: ZOCOR Take 40 mg by mouth daily at 6 PM.   warfarin 5 MG tablet Commonly known as: Jantoven Take as directed. If you are unsure how to take this medication, talk to your nurse or doctor. Original instructions: TAKE 1 TABLET DAILY EXCEPT 2 TABLETS ON  FRIDAYS What changed:   how much to take  how to take this  when to take this  additional instructions       Follow-up Information    [name], MD Follow up in 1 week(s).   Specialty: Internal Medicine Contact information: [facility] [address] [phone]        Satira Sark, MD Follow up in 1 week(s).   Specialty: Cardiology Contact information: [place] [address] [phone]        [facility] Follow up in 6 week(s).   Contact information: [address] [place] [phone]             No Known Allergies  Consultations:  General surgery-Dr. Constance Haw   Procedures/Studies: CT ABDOMEN PELVIS W CONTRAST  Result Date: 01/13/2020 CLINICAL DATA:  Recent colonoscopy.  Mid abdominal pain. EXAM: CT ABDOMEN AND PELVIS WITH CONTRAST TECHNIQUE: Multidetector CT imaging of the abdomen and pelvis was performed using the standard protocol following bolus administration of intravenous contrast. CONTRAST:  156mL OMNIPAQUE IOHEXOL 300 MG/ML  SOLN COMPARISON:  None. FINDINGS: Lower chest: No acute abnormality. Hepatobiliary: No focal hepatic abnormality. Gallbladder unremarkable. Unusual configuration of the liver with gallbladder noted posteriorly. Pancreas: No focal abnormality or ductal dilatation. Spleen: No focal abnormality.  Normal size. Adrenals/Urinary Tract: No  adrenal abnormality. No focal renal abnormality. No stones or hydronephrosis. Urinary bladder is unremarkable. Stomach/Bowel: Marked inflammation noted around the 1st and 2nd portions of the duodenum. There is a fluid collection noted anterior to the proximal descending duodenum which communicates with the duodenal lumen. This most likely reflects changes of severe duodenitis/peptic ulcer disease with a large peptic ulcer. Left colonic diverticulosis. No active diverticulitis. Normal appendix. Stomach and small bowel decompressed, unremarkable. Vascular/Lymphatic: Aortic atherosclerosis. No enlarged abdominal or pelvic lymph nodes. Reproductive: No visible focal abnormality. Other: No free fluid or free air. Musculoskeletal: No acute bony abnormality. IMPRESSION: Marked inflammation and wall thickening involving the proximal duodenum compatible with severe duodenitis/peptic ulcer disease. There is a fluid collection anterior to the proximal duodenum which communicates with the lumen, likely large peptic ulcer. Left colonic diverticulosis.  No active diverticulitis. Electronically Signed   By: Rolm Baptise M.D.   On: 01/13/2020 11:32   DG UGI W SINGLE CM (SOL OR THIN BA)  Result Date: 01/18/2020 CLINICAL DATA:  Inflammation along the duodenum, possible perforated ulcer. EXAM: WATER SOLUBLE UPPER GI SERIES TECHNIQUE: Single-column upper GI series was performed using water soluble contrast. CONTRAST:  Omnipaque 300 COMPARISON:  CT abdomen 01/13/2020 FLUOROSCOPY TIME:  Fluoroscopy Time:  3 minutes, 18 seconds Radiation Exposure Index (if provided by the fluoroscopic device): 55.5 mGy Number of Acquired Spot Images: 2 FINDINGS: Initial KUB demonstrates lumbar spondylosis. No compelling findings of free intraperitoneal gas. The pharyngeal phase of swallowing was not assessed. Expected contour and appearance of the esophagus with some occasional secondary and tertiary contractions of the distal esophagus. Roughly  normal gastric morphology. Proximal duodenal fold thickening noted suspicious for duodenitis. In corroboration with the CT appearance, there are two outpouchings of the descending duodenum thought to probably be duodenal diverticula which may have some degree of inflammation. It is difficult to completely exclude the possibility of contained ulceration, although at least one of these outpouchings has a bulbous well-defined margin medially on image 1/16 more characteristic of a diverticulum. There is no leakage of contrast from either of these two small suspected outpouchings. Moreover, there is thought to be an anterior ulceration in the vicinity of the duodenal bulb, with heaped margins of mucosa along the ulceration shown on image 33/14. No extravasation of contrast. Finally, there is a larger diverticulum of the distal third portion of the duodenum shown on image 79/14. IMPRESSION: 1. Ulceration of the anterior duodenal bulb without visible extravasation/leak at this time. 2. Considerable proximal duodenal fold thickening compatible with duodenitis. 3. At least one and possibly two medial diverticula of the descending duodenum, contained and without visible leak. 4. Larger diverticulum of the distal duodenum, without current inflammatory findings associated with this distal duodenal diverticulum. 5. Today's exam was performed with water-soluble contrast medium, and was optimized to focus on the duodenum where there was greatest concern. Electronically Signed   By: Thayer Jew  Janeece Fitting M.D.   On: 01/18/2020 09:04   [place] EKG SITE RITE  Result Date: 01/14/2020 If Site Rite image not attached, placement could not be confirmed due to current cardiac rhythm.    Discharge Exam: Vitals:   01/21/20 0430 01/21/20 0753  BP: 108/72   Pulse: 75   Resp: 16   Temp: 98.1 F (36.7 C)   SpO2: 95% 94%   Vitals:   01/20/20 2121 01/21/20 0430 01/21/20 0753 01/21/20 0900  BP: 100/62 108/72    Pulse:  75    Resp:  16     Temp:  98.1 F (36.7 C)    TempSrc:  Oral    SpO2:  95% 94%   Weight:    83.5 kg  Height:        General: Pt is alert, awake, not in acute distress Cardiovascular: RRR, S1/S2 +, no rubs, no gallops Respiratory: CTA bilaterally, no wheezing, no rhonchi Abdominal: Soft, NT, ND, bowel sounds + Extremities: no edema, no cyanosis    The results of significant diagnostics from this hospitalization (including imaging, microbiology, ancillary and laboratory) are listed below for reference.     Microbiology: Recent Results (from the past 240 hour(s))  SARS Coronavirus 2 by RT PCR (hospital order, performed in [facility] hospital lab) Nasopharyngeal Nasopharyngeal Swab     Status: None   Collection Time: 01/13/20  2:39 PM   Specimen: Nasopharyngeal Swab  Result Value Ref Range Status   SARS Coronavirus 2 NEGATIVE NEGATIVE Final    Comment: (NOTE) SARS-CoV-2 target nucleic acids are NOT DETECTED.  The SARS-CoV-2 RNA is generally detectable in upper and lower respiratory specimens during the acute phase of infection. The lowest concentration of SARS-CoV-2 viral copies this assay can detect is 250 copies / mL. A negative result does not preclude SARS-CoV-2 infection and should not be used as the sole basis for treatment or other patient management decisions.  A negative result may occur with improper specimen collection / handling, submission of specimen other than nasopharyngeal swab, presence of viral mutation(s) within the areas targeted by this assay, and inadequate number of viral copies (<250 copies / mL). A negative result must be combined with clinical observations, patient history, and epidemiological information.  Fact Sheet for Patients:   StrictlyIdeas.no  Fact Sheet for Healthcare Providers: BankingDealers.co.za  This test is not yet approved or  cleared by the [place] FDA and has been authorized for detection  and/or diagnosis of SARS-CoV-2 by FDA under an Emergency Use Authorization (EUA).  This EUA will remain in effect (meaning this test can be used) for the duration of the COVID-19 declaration under Section 564(b)(1) of the Act, 21 U.S.C. section 360bbb-3(b)(1), unless the authorization is terminated or revoked sooner.  Performed at [facility], [address] [place], [place] [address]      Labs: BNP (last 3 results) No results for input(s): BNP in the last 8760 hours. Basic Metabolic Panel: Recent Labs  Lab 01/15/20 0646 01/17/20 0600 01/20/20 0506 01/21/20 0515  NA 137 132* 136 135  K 4.2 4.4 5.6* 4.7  CL 104 101 104 101  CO2 24 22 23 24   GLUCOSE 154* 191* 141* 145*  BUN 28* 21 20 30*  CREATININE 1.47* 1.48* 1.59* 1.89*  CALCIUM 9.0 8.9 9.2 9.1  MG 2.1 2.2 2.0  --   PHOS 3.0 2.9 2.8  --    Liver Function Tests: Recent Labs  Lab 01/15/20 0646 01/17/20 0600 01/20/20 0506  AST 16 34  32  ALT 24 54* 74*  ALKPHOS 47 46 56  BILITOT 0.3 0.3 0.5  PROT 6.9 7.1 7.6  ALBUMIN 3.6 3.5 3.7   No results for input(s): LIPASE, AMYLASE in the last 168 hours. No results for input(s): AMMONIA in the last 168 hours. CBC: Recent Labs  Lab 01/15/20 0646 01/17/20 0600 01/20/20 0506  WBC 9.3 9.0 10.4  NEUTROABS 6.4 6.7  --   HGB 11.4* 11.4* 12.3*  HCT 36.3* 37.0* 40.4  MCV 91.2 92.5 91.6  PLT 240 203 191   Cardiac Enzymes: No results for input(s): CKTOTAL, CKMB, CKMBINDEX, TROPONINI in the last 168 hours. BNP: Invalid input(s): POCBNP CBG: Recent Labs  Lab 01/20/20 0812 01/20/20 1135 01/20/20 1638 01/20/20 2005 01/21/20 0737  GLUCAP 138* 167* 80 151* 154*   D-Dimer No results for input(s): DDIMER in the last 72 hours. Hgb A1c No results for input(s): HGBA1C in the last 72 hours. Lipid Profile No results for input(s): CHOL, HDL, LDLCALC, TRIG, CHOLHDL, LDLDIRECT in the last 72 hours. Thyroid function studies No results for input(s): TSH, T4TOTAL, T3FREE,  THYROIDAB in the last 72 hours.  Invalid input(s): FREET3 Anemia work up No results for input(s): VITAMINB12, FOLATE, FERRITIN, TIBC, IRON, RETICCTPCT in the last 72 hours. Urinalysis    Component Value Date/Time   COLORURINE YELLOW 04/06/2011 [place] 04/06/2011 0454   LABSPEC 1.022 04/06/2011 0454   PHURINE 6.0 04/06/2011 0454   GLUCOSEU NEGATIVE 04/06/2011 0454   HGBUR NEGATIVE 04/06/2011 0454   BILIRUBINUR NEGATIVE 04/06/2011 0454   KETONESUR NEGATIVE 04/06/2011 0454   PROTEINUR NEGATIVE 04/06/2011 0454   UROBILINOGEN 0.2 04/06/2011 0454   NITRITE NEGATIVE 04/06/2011 0454   LEUKOCYTESUR NEGATIVE 04/06/2011 0454   Sepsis Labs Invalid input(s): PROCALCITONIN,  WBC,  LACTICIDVEN Microbiology Recent Results (from the past 240 hour(s))  SARS Coronavirus 2 by RT PCR (hospital order, performed in [facility] hospital lab) Nasopharyngeal Nasopharyngeal Swab     Status: None   Collection Time: 01/13/20  2:39 PM   Specimen: Nasopharyngeal Swab  Result Value Ref Range Status   SARS Coronavirus 2 NEGATIVE NEGATIVE Final    Comment: (NOTE) SARS-CoV-2 target nucleic acids are NOT DETECTED.  The SARS-CoV-2 RNA is generally detectable in upper and lower respiratory specimens during the acute phase of infection. The lowest concentration of SARS-CoV-2 viral copies this assay can detect is 250 copies / mL. A negative result does not preclude SARS-CoV-2 infection and should not be used as the sole basis for treatment or other patient management decisions.  A negative result may occur with improper specimen collection / handling, submission of specimen other than nasopharyngeal swab, presence of viral mutation(s) within the areas targeted by this assay, and inadequate number of viral copies (<250 copies / mL). A negative result must be combined with clinical observations, patient history, and epidemiological information.  Fact Sheet for Patients:    StrictlyIdeas.no  Fact Sheet for Healthcare Providers: BankingDealers.co.za  This test is not yet approved or  cleared by the [place] FDA and has been authorized for detection and/or diagnosis of SARS-CoV-2 by FDA under an Emergency Use Authorization (EUA).  This EUA will remain in effect (meaning this test can be used) for the duration of the COVID-19 declaration under Section 564(b)(1) of the Act, 21 U.S.C. section 360bbb-3(b)(1), unless the authorization is terminated or revoked sooner.  Performed at [facility], [address], [place], [place] [address]      Time coordinating discharge: 35 minutes  SIGNED:   Rodena Goldmann, DO [facility] 01/21/2020, 10:41 AM  If 7PM-7AM, please contact night-coverage [email or web address]

## 2020-01-21 NOTE — Telephone Encounter (Signed)
Can we have patient return in 6-8 weeks, recently hospitalized with perforated duodenal ulcer. Last seen by Randall Hiss. Thanks!

## 2020-01-30 NOTE — Progress Notes (Signed)
Cardiology Office Note  Date: 01/31/2020   ID: Yu, Dennis 05-18-1946, MRN [id number]  PCP:  Dennis Squibb, MD  Cardiologist:  Dennis Lesches, MD Electrophysiologist:  None   Chief Complaint: Follow-up ischemic cardiomyopathy  History of Present Illness: Dennis Yu is a 74 y.o. male with a history of ischemic cardiomyopathy, DM, HTN, GERD, HLD, OSA, CVA  Last encounter 11/26/2019 with Dr. Domenic Yu.  His EF was 25% and his fluid status was controlled.  He preferred conservative management without invasive testing.  He was continuing his aspirin, Coreg, Benicar, Aldactone, Lasix, Zocor.  On Coumadin for stroke prophylaxis followed in Coumadin clinic.  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 Maintained on TPN , Zosyn , Fluconazole. GI/SBFT with no leakage noted. Had colonoscopy on 01/11/2020 with snare polypectomy and had not been eating d/t epigastric pain and had no BM since colonoscopy prep. He was to be scheduled for repeat EGD 6-8 weeks to further assess.  He had experienced some hypotension with the reinitiation of home medications.  His Coreg, olmesartan, Lasix and Aldactone were held due to hypotension at discharge.  His weight today is 197.  He is up 13 pounds from last weight of 184 on 01/21/2020.  States he has noticed increased edema in both legs since being without his Lasix for the last 10 days.  Past Medical History:  Diagnosis Date  . Bronchitis 11/2012  . Cardiomyopathy    LVEF 25%  . Diabetes mellitus   . Erectile dysfunction   . Essential hypertension   . GERD (gastroesophageal reflux disease)   . History of colonic polyps   . Mixed hyperlipidemia   . 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 Obstructive sleep apnea   . Patent foramen ovale   . Phlebitis   . 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 Superficial phlebitis 12/10/2012  . Type 2  diabetes mellitus ([place])   . Vitamin D deficiency     Past Surgical History:  Procedure Laterality Date  . BIOPSY  01/11/2020   Procedure: BIOPSY;  Surgeon: Dennis Dolin, MD;  Location[facility];  Service: Endoscopy;;  . COLONOSCOPY WITH PROPOFOL N/A 01/11/2020   Procedure: COLONOSCOPY WITH PROPOFOL;  Surgeon: Dennis Dolin, MD;  Location[facility];  Service: Endoscopy;  Laterality: N/A;  10:15am  . NO PAST SURGERIES    . POLYPECTOMY  01/11/2020   Procedure: POLYPECTOMY;  Surgeon: Dennis Dolin, MD;  [facility];  Service: Endoscopy;;    Current Outpatient Medications  Medication Sig Dispense Refill  . aspirin 81 MG tablet Take 81 mg by mouth daily.      [name] gabapentin (NEURONTIN) 300 MG capsule Take 3 capsules (900 mg total) by mouth daily. (Patient taking differently: Take 300-600 mg by mouth See admin instructions. 300 mg during the day, 600 mg at bedtime) 90 capsule 11  . glipiZIDE (GLUCOTROL) 5 MG tablet Take 5 mg by mouth daily.     . NON FORMULARY legatrin pm Daily      . Omega-3 Fatty Acids (FISH OIL) 1000 MG CAPS Take 1,000 mg by mouth daily.     . ondansetron (ZOFRAN) 4 MG tablet Take 1 tablet (4 mg total) by mouth every 6 (six) hours as needed for nausea. 20 tablet 0  . pantoprazole (PROTONIX) 40 MG tablet Take 1 tablet (  40 mg total) by mouth 2 (two) times daily. 30 tablet 1  . PROAIR HFA 108 (90 BASE) MCG/ACT inhaler Inhale 2 puffs into the lungs every 4 (four) hours as needed for wheezing or shortness of breath.     . simvastatin (ZOCOR) 40 MG tablet Take 40 mg by mouth daily at 6 PM.     . warfarin (JANTOVEN) 5 MG tablet TAKE 1 TABLET DAILY EXCEPT 2 TABLETS ON  FRIDAYS 100 tablet 3   No current facility-administered medications for this visit.   Allergies:  Patient has no known allergies.   Social History: The patient  reports that he quit smoking about 16 years ago. His smoking use included cigarettes. He started smoking about 52 years ago. He has  never used smokeless tobacco. He reports that he does not drink alcohol and does not use drugs.   Family History: The patient's family history includes Diabetes in his father; Hypertension in an other family member.   ROS:  Please see the history of present illness. Otherwise, complete review of systems is positive for none.  All other systems are reviewed and negative.   Physical Exam: VS:  BP 132/88   Pulse 83   Ht 6\' 3"  (1.905 m)   Wt 197 lb (89.4 kg)   SpO2 95%   BMI 24.62 kg/m , BMI Body mass index is 24.62 kg/m.  Wt Readings from Last 3 Encounters:  01/31/20 197 lb (89.4 kg)  01/21/20 184 lb (83.5 kg)  01/07/20 204 lb (92.5 kg)    General: Patient appears comfortable at rest. Neck: Supple, no elevated JVP or carotid bruits, no thyromegaly. Lungs: Clear to auscultation, nonlabored breathing at rest. Cardiac: Regular rate and rhythm, no S3 or significant systolic murmur, no pericardial rub. Extremities: Mild pitting edema bilaterally, distal pulses 2+. Skin: Warm and dry. Musculoskeletal: No kyphosis. Neuropsychiatric: Alert and oriented x3, affect grossly appropriate.  ECG:  EKG 01/13/2020 sinus rhythm 69, multiform ventricular premature complexes, probable inferior infarct, old, anterior lateral infarct, age indeterminate  Recent Labwork: 01/20/2020: ALT 74; AST 32; Hemoglobin 12.3; Magnesium 2.0; Platelets 191 01/21/2020: BUN 30; Creatinine, Ser 1.89; Potassium 4.7; Sodium 135     Component Value Date/Time   CHOL 145 04/05/2011 0545   TRIG 143 01/17/2020 0600   HDL 40 04/05/2011 0545   CHOLHDL 3.6 04/05/2011 0545   VLDL 13 04/05/2011 0545   LDLCALC 92 04/05/2011 0545    Other Studies Reviewed Today: Echocardiogram 04/30/2019: 1. Left ventricular ejection fraction, by visual estimation, is 25%. The left ventricle has severely decreased function. Moderately increased left ventricular size. There is mildly increased left ventricular hypertrophy. 2. Multiple segmental  abnormalities exist. See findings. 3. Definity contrast agent was given IV to delineate the left ventricular endocardial borders. 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 Inferior apical calcification consistent with old thrombus. 4. Left ventricular diastolic Doppler parameters are consistent with pseudonormalization pattern of LV diastolic filling. 5. Global right ventricle has normal systolic function.The right ventricular size is normal. No increase in right ventricular wall thickness. 6. Left atrial size was severely dilated. 7. Right atrial size was normal. 8. Mild to moderate aortic valve annular calcification. 9. The mitral valve is grossly normal. Mild mitral valve regurgitation. 10. The tricuspid valve is grossly normal. Tricuspid valve regurgitation is trivial. 11. The aortic valve is tricuspid Aortic valve regurgitation was not visualized by color flow Doppler. 12. The pulmonic valve was grossly normal. Pulmonic valve regurgitation is trivial by color flow  Doppler. 13. Normal pulmonary artery systolic pressure. 14. The inferior vena cava is normal in size with greater than 50% respiratory variability, suggesting right atrial pressure of 3 mmHg. 15. The tricuspid regurgitant velocity is 2.33 m/s, and with an assumed right atrial pressure of 3 mmHg, the estimated right ventricular systolic pressure is normal at 24.7 mmHg  Assessment and Plan:  1. Ischemic cardiomyopathy   2. History of stroke   3. Essential hypertension, benign    1. Ischemic cardiomyopathy History of ischemic cardiomyopathy with last echocardiogram showing EF of 25% mildly increased LV size, mild increased LVH multiple segmental abnormalities, mild MR, trivial TR trivial PR.  Repeat echo to assess LV function and valvular disease.  Restart Lasix 40 mg daily.  Restart carvedilol at reduced dose of 6.25 mg p.o. twice daily.  Continue aspirin 81 mg daily.  Continue  Zocor 40 mg daily once results of echocardiogram are back and blood pressure tolerates, we may reintroduce olmesartan and Aldactone.  Get BMP in 2 weeks   2. History of stroke No focal neurological deficits.  No CVA or TIA-like symptoms.  Continue Coumadin 5 mg daily  3. Essential hypertension, benign Patient was hypotensive at discharge from recent hospital visit.  His carvedilol, olmesartan, Lasix, Aldactone were stopped prior to discharge.  I am cautiously restarting carvedilol at 6.25 mg p.o. twice daily.  He was previously on 12.5 mg p.o. twice daily   Medication Adjustments/Labs and Tests Ordered: Current medicines are reviewed at length with the patient today.  Concerns regarding medicines are outlined above.   Disposition: Follow-up with Dr. Domenic Yu or [facility] 4 to 6 weeks Signed, Levell July, NP 01/31/2020 3:09 PM    [facility] at [place], [place], [place] [address] Phone: [phone]; Fax: [phone]

## 2020-01-31 ENCOUNTER — Encounter: Payer: Self-pay | Admitting: Family Medicine

## 2020-01-31 ENCOUNTER — Ambulatory Visit (INDEPENDENT_AMBULATORY_CARE_PROVIDER_SITE_OTHER): Payer: Medicare Other | Admitting: Family Medicine

## 2020-01-31 ENCOUNTER — Ambulatory Visit (INDEPENDENT_AMBULATORY_CARE_PROVIDER_SITE_OTHER): Payer: Medicare Other | Admitting: *Deleted

## 2020-01-31 VITALS — BP 132/88 | HR 83 | Ht 75.0 in | Wt 197.0 lb

## 2020-01-31 DIAGNOSIS — I10 Essential (primary) hypertension: Secondary | ICD-10-CM

## 2020-01-31 DIAGNOSIS — Z5181 Encounter for therapeutic drug level monitoring: Secondary | ICD-10-CM

## 2020-01-31 DIAGNOSIS — I255 Ischemic cardiomyopathy: Secondary | ICD-10-CM

## 2020-01-31 DIAGNOSIS — I6350 Cerebral infarction due to unspecified occlusion or stenosis of unspecified cerebral artery: Secondary | ICD-10-CM

## 2020-01-31 DIAGNOSIS — Z86718 Personal history of other venous thrombosis and embolism: Secondary | ICD-10-CM

## 2020-01-31 DIAGNOSIS — Z8673 Personal history of transient ischemic attack (TIA), and cerebral infarction without residual deficits: Secondary | ICD-10-CM

## 2020-01-31 DIAGNOSIS — I639 Cerebral infarction, unspecified: Secondary | ICD-10-CM | POA: Diagnosis not present

## 2020-01-31 DIAGNOSIS — Z7901 Long term (current) use of anticoagulants: Secondary | ICD-10-CM

## 2020-01-31 LAB — POCT INR: INR: 4.5 — AB (ref 2.0–3.0)

## 2020-01-31 MED ORDER — FUROSEMIDE 40 MG PO TABS: 40.0000 mg | ORAL_TABLET | Freq: Every day | ORAL | 3 refills | Status: DC

## 2020-01-31 MED ORDER — WARFARIN SODIUM 5 MG PO TABS: ORAL_TABLET | ORAL | 3 refills | Status: DC

## 2020-01-31 MED ORDER — FUROSEMIDE 40 MG PO TABS
40.0000 mg | ORAL_TABLET | Freq: Every day | ORAL | 3 refills | Status: DC
Start: 2020-01-31 — End: 2020-01-31

## 2020-01-31 MED ORDER — CARVEDILOL 6.25 MG PO TABS
6.2500 mg | ORAL_TABLET | Freq: Two times a day (BID) | ORAL | 3 refills | Status: DC
Start: 2020-01-31 — End: 2020-01-31

## 2020-01-31 MED ORDER — CARVEDILOL 6.25 MG PO TABS: 6.2500 mg | ORAL_TABLET | Freq: Two times a day (BID) | ORAL | 3 refills | Status: DC

## 2020-01-31 NOTE — Patient Instructions (Signed)
Hold warfarin tonight, take 1/2 tablet tomorrow night then resume 1 tablet daily except 2 tablets on Fridays.  Recheck in 2 weeks

## 2020-01-31 NOTE — Patient Instructions (Addendum)
Medication Instructions:   Your physician has recommended you make the following change in your medication:   Restart furosemide 40 mg by mouth daily  Restart carvedilol at 6.25 mg by mouth twice daily. Please break your 12.5 mg tablets in half twice daily  Continue other medications the same *If you need a refill on your cardiac medications before your next appointment, please call your pharmacy*   Lab Work:  Your physician recommends that you schedule a follow-up appointment in: 2 weeks to check your BMET. This may be done at [facility] or [facility] [phone] [place]) Monday-Friday from 8:00 am - 4:00 pm. No appointment needed.  If you have labs (blood work) drawn today and your tests are completely normal, you will receive your results only by: [name] MyChart Message (if you have MyChart) OR . A paper copy in the mail If you have any lab test that is abnormal or we need to change your treatment, we will call you to review the results.   Testing/Procedures: Your physician has requested that you have an echocardiogram. Echocardiography is a painless test that uses sound waves to create images of your heart. It provides your doctor with information about the size and shape of your heart and how well your heart's chambers and valves are working. This procedure takes approximately one hour. There are no restrictions for this procedure.   Follow-Up: At [facility], you and your health needs are our priority.  As part of our continuing mission to provide you with exceptional heart care, we have created designated Provider Care Teams.  These Care Teams include your primary Cardiologist (physician) and Advanced Practice Providers (APPs -  Physician Assistants and Nurse Practitioners) who all work together to provide you with the care you need, when you need it.  We recommend signing up for the patient portal called "MyChart".  Sign up information is provided on this After Visit  Summary.  MyChart is used to connect with patients for Virtual Visits (Telemedicine).  Patients are able to view lab/test results, encounter notes, upcoming appointments, etc.  Non-urgent messages can be sent to your provider as well.   To learn more about what you can do with MyChart, go to [email or web address].    Your next appointment:    1 month  The format for your next appointment:    In Person  Provider:   You may see Rozann Lesches, MD or the following Advanced Practice Provider on your designated Care Team:    Katina Dung, NP

## 2020-01-31 NOTE — Addendum Note (Signed)
Addended by: Malen Gauze on: 01/31/2020 02:23 PM   Modules accepted: Orders

## 2020-02-03 ENCOUNTER — Other Ambulatory Visit: Payer: Self-pay | Admitting: Cardiovascular Disease

## 2020-02-03 DIAGNOSIS — E782 Mixed hyperlipidemia: Secondary | ICD-10-CM | POA: Diagnosis not present

## 2020-02-03 DIAGNOSIS — I5032 Chronic diastolic (congestive) heart failure: Secondary | ICD-10-CM | POA: Diagnosis not present

## 2020-02-03 DIAGNOSIS — Z0189 Encounter for other specified special examinations: Secondary | ICD-10-CM | POA: Diagnosis not present

## 2020-02-03 DIAGNOSIS — I10 Essential (primary) hypertension: Secondary | ICD-10-CM | POA: Diagnosis not present

## 2020-02-03 DIAGNOSIS — E1122 Type 2 diabetes mellitus with diabetic chronic kidney disease: Secondary | ICD-10-CM | POA: Diagnosis not present

## 2020-02-03 DIAGNOSIS — N189 Chronic kidney disease, unspecified: Secondary | ICD-10-CM | POA: Diagnosis not present

## 2020-02-03 DIAGNOSIS — I130 Hypertensive heart and chronic kidney disease with heart failure and stage 1 through stage 4 chronic kidney disease, or unspecified chronic kidney disease: Secondary | ICD-10-CM | POA: Diagnosis not present

## 2020-02-08 ENCOUNTER — Ambulatory Visit: Payer: Medicare Other | Admitting: Student

## 2020-02-14 ENCOUNTER — Other Ambulatory Visit: Payer: Self-pay

## 2020-02-14 ENCOUNTER — Ambulatory Visit (INDEPENDENT_AMBULATORY_CARE_PROVIDER_SITE_OTHER): Payer: Medicare Other | Admitting: *Deleted

## 2020-02-14 DIAGNOSIS — Z7901 Long term (current) use of anticoagulants: Secondary | ICD-10-CM

## 2020-02-14 DIAGNOSIS — I6350 Cerebral infarction due to unspecified occlusion or stenosis of unspecified cerebral artery: Secondary | ICD-10-CM | POA: Diagnosis not present

## 2020-02-14 DIAGNOSIS — I6349 Cerebral infarction due to embolism of other cerebral artery: Secondary | ICD-10-CM

## 2020-02-14 DIAGNOSIS — Z86718 Personal history of other venous thrombosis and embolism: Secondary | ICD-10-CM

## 2020-02-14 DIAGNOSIS — Z5181 Encounter for therapeutic drug level monitoring: Secondary | ICD-10-CM | POA: Diagnosis not present

## 2020-02-14 DIAGNOSIS — I639 Cerebral infarction, unspecified: Secondary | ICD-10-CM

## 2020-02-14 LAB — POCT INR: INR: 2.5 (ref 2.0–3.0)

## 2020-02-14 NOTE — Patient Instructions (Signed)
Continue 1 tablet daily except 2 tablets on Fridays.  Recheck in 4 weeks 

## 2020-02-23 ENCOUNTER — Ambulatory Visit (INDEPENDENT_AMBULATORY_CARE_PROVIDER_SITE_OTHER): Payer: Medicare Other

## 2020-02-23 DIAGNOSIS — I255 Ischemic cardiomyopathy: Secondary | ICD-10-CM

## 2020-02-23 LAB — ECHOCARDIOGRAM COMPLETE
Area-P 1/2: 3.74 cm2
Calc EF: 28.7 %
MV M vel: 4.39 m/s
MV Peak grad: 77 mmHg
S' Lateral: 4.58 cm
Single Plane A2C EF: 35.2 %
Single Plane A4C EF: 25.1 %

## 2020-02-24 ENCOUNTER — Encounter: Payer: Self-pay | Admitting: *Deleted

## 2020-02-24 ENCOUNTER — Ambulatory Visit: Payer: Medicare Other | Admitting: Cardiology

## 2020-02-24 ENCOUNTER — Telehealth: Payer: Self-pay | Admitting: *Deleted

## 2020-02-24 NOTE — Telephone Encounter (Signed)
Patient informed. Copy sent to PCP °

## 2020-02-24 NOTE — Telephone Encounter (Signed)
-----   Message from Dennis Yu., NP sent at 02/23/2020  6:17 PM EDT ----- Please call the patient and let him know the echocardiogram shows his pumping function is the same as last time at 25%. He has a calcified thrombus in his left ventricle but he is on anticoagulant. Mitral valve is moderately leaky. It was mildly leaky on last echo. Thanks

## 2020-02-25 ENCOUNTER — Inpatient Hospital Stay (HOSPITAL_COMMUNITY): Payer: Medicare Other | Attending: Hematology

## 2020-02-25 ENCOUNTER — Other Ambulatory Visit (HOSPITAL_COMMUNITY)
Admission: RE | Admit: 2020-02-25 | Discharge: 2020-02-25 | Disposition: A | Discharge status: Home / Self Care | Payer: Medicare Other | Source: Ambulatory Visit | Attending: Internal Medicine | Admitting: Internal Medicine

## 2020-02-25 ENCOUNTER — Inpatient Hospital Stay (HOSPITAL_BASED_OUTPATIENT_CLINIC_OR_DEPARTMENT_OTHER): Payer: Medicare Other | Admitting: Nurse Practitioner

## 2020-02-25 ENCOUNTER — Other Ambulatory Visit: Payer: Self-pay

## 2020-02-25 DIAGNOSIS — Z8719 Personal history of other diseases of the digestive system: Secondary | ICD-10-CM | POA: Insufficient documentation

## 2020-02-25 DIAGNOSIS — Z79899 Other long term (current) drug therapy: Secondary | ICD-10-CM | POA: Diagnosis not present

## 2020-02-25 DIAGNOSIS — Z7982 Long term (current) use of aspirin: Secondary | ICD-10-CM | POA: Insufficient documentation

## 2020-02-25 DIAGNOSIS — E782 Mixed hyperlipidemia: Secondary | ICD-10-CM | POA: Diagnosis not present

## 2020-02-25 DIAGNOSIS — Z87891 Personal history of nicotine dependence: Secondary | ICD-10-CM | POA: Diagnosis not present

## 2020-02-25 DIAGNOSIS — N189 Chronic kidney disease, unspecified: Secondary | ICD-10-CM | POA: Diagnosis not present

## 2020-02-25 DIAGNOSIS — K219 Gastro-esophageal reflux disease without esophagitis: Secondary | ICD-10-CM | POA: Insufficient documentation

## 2020-02-25 DIAGNOSIS — Z8673 Personal history of transient ischemic attack (TIA), and cerebral infarction without residual deficits: Secondary | ICD-10-CM | POA: Insufficient documentation

## 2020-02-25 DIAGNOSIS — D509 Iron deficiency anemia, unspecified: Secondary | ICD-10-CM

## 2020-02-25 DIAGNOSIS — Z8249 Family history of ischemic heart disease and other diseases of the circulatory system: Secondary | ICD-10-CM | POA: Insufficient documentation

## 2020-02-25 DIAGNOSIS — E119 Type 2 diabetes mellitus without complications: Secondary | ICD-10-CM | POA: Diagnosis not present

## 2020-02-25 DIAGNOSIS — I10 Essential (primary) hypertension: Secondary | ICD-10-CM | POA: Insufficient documentation

## 2020-02-25 DIAGNOSIS — Z7984 Long term (current) use of oral hypoglycemic drugs: Secondary | ICD-10-CM | POA: Diagnosis not present

## 2020-02-25 DIAGNOSIS — G4733 Obstructive sleep apnea (adult) (pediatric): Secondary | ICD-10-CM | POA: Insufficient documentation

## 2020-02-25 DIAGNOSIS — Z7901 Long term (current) use of anticoagulants: Secondary | ICD-10-CM | POA: Insufficient documentation

## 2020-02-25 DIAGNOSIS — Z833 Family history of diabetes mellitus: Secondary | ICD-10-CM | POA: Insufficient documentation

## 2020-02-25 DIAGNOSIS — I255 Ischemic cardiomyopathy: Secondary | ICD-10-CM | POA: Insufficient documentation

## 2020-02-25 LAB — VITAMIN B12: Vitamin B-12: 644 pg/mL (ref 180–914)

## 2020-02-25 LAB — COMPREHENSIVE METABOLIC PANEL
ALT: 35 U/L (ref 0–44)
AST: 33 U/L (ref 15–41)
Albumin: 4 g/dL (ref 3.5–5.0)
Alkaline Phosphatase: 57 U/L (ref 38–126)
Anion gap: 10 (ref 5–15)
BUN: 18 mg/dL (ref 8–23)
CO2: 24 mmol/L (ref 22–32)
Calcium: 9.1 mg/dL (ref 8.9–10.3)
Chloride: 105 mmol/L (ref 98–111)
Creatinine, Ser: 1.43 mg/dL — ABNORMAL HIGH (ref 0.61–1.24)
GFR calc Af Amer: 56 mL/min — ABNORMAL LOW (ref >60)
GFR calc non Af Amer: 49 mL/min — ABNORMAL LOW (ref >60)
Glucose, Bld: 267 mg/dL — ABNORMAL HIGH (ref 70–99)
Potassium: 4 mmol/L (ref 3.5–5.1)
Sodium: 139 mmol/L (ref 135–145)
Total Bilirubin: 0.4 mg/dL (ref 0.3–1.2)
Total Protein: 7.2 g/dL (ref 6.5–8.1)

## 2020-02-25 LAB — CBC WITH DIFFERENTIAL/PLATELET
Abs Immature Granulocytes: 0 10*3/uL (ref 0.00–0.07)
Basophils Absolute: 0 10*3/uL (ref 0.0–0.1)
Basophils Relative: 1 %
Eosinophils Absolute: 0.3 10*3/uL (ref 0.0–0.5)
Eosinophils Relative: 6 %
HCT: 40.3 % (ref 39.0–52.0)
Hemoglobin: 11.9 g/dL — ABNORMAL LOW (ref 13.0–17.0)
Immature Granulocytes: 0 %
Lymphocytes Relative: 32 %
Lymphs Abs: 1.4 10*3/uL (ref 0.7–4.0)
MCH: 28 pg (ref 26.0–34.0)
MCHC: 29.5 g/dL — ABNORMAL LOW (ref 30.0–36.0)
MCV: 94.8 fL (ref 80.0–100.0)
Monocytes Absolute: 0.3 10*3/uL (ref 0.1–1.0)
Monocytes Relative: 8 %
Neutro Abs: 2.5 10*3/uL (ref 1.7–7.7)
Neutrophils Relative %: 53 %
Platelets: 194 10*3/uL (ref 150–400)
RBC: 4.25 MIL/uL (ref 4.22–5.81)
RDW: 13 % (ref 11.5–15.5)
WBC: 4.6 10*3/uL (ref 4.0–10.5)
nRBC: 0 % (ref 0.0–0.2)

## 2020-02-25 LAB — IRON AND TIBC
Iron: 69 ug/dL (ref 45–182)
Saturation Ratios: 19 % (ref 17.9–39.5)
TIBC: 367 ug/dL (ref 250–450)
UIBC: 298 ug/dL

## 2020-02-25 LAB — LACTATE DEHYDROGENASE: LDH: 183 U/L (ref 98–192)

## 2020-02-25 LAB — FERRITIN: Ferritin: 90 ng/mL (ref 24–336)

## 2020-02-25 LAB — VITAMIN D 25 HYDROXY (VIT D DEFICIENCY, FRACTURES): Vit D, 25-Hydroxy: 26.16 ng/mL — ABNORMAL LOW (ref 30–100)

## 2020-02-25 NOTE — Progress Notes (Signed)
[place], [place] [address]   CLINIC:  Medical Oncology/Hematology  PCP:  [name], MD [address] [place] [address] [phone]   REASON FOR VISIT: Follow-up for iron deficiency anemia   CURRENT THERAPY: Oral iron therapy   INTERVAL HISTORY:  Mr. Payer 74 y.o. male returns for routine follow-up for iron deficiency anemia.  Patient reports he is feeling fine since his last visit.  He denies any bright red bleeding per rectum or melena.  Denies any easy bruising or bleeding.  He does have occasional diarrhea versus constipation. Denies any nausea or vomiting. Denies any new pains. Had not noticed any recent bleeding such as epistaxis, hematuria or hematochezia. Denies recent chest pain on exertion, shortness of breath on minimal exertion, pre-syncopal episodes, or palpitations. Denies any numbness or tingling in hands or feet. Denies any recent fevers, infections, or recent hospitalizations. Patient reports appetite at 100% and energy level at 25%.  He is eating well maintain his weight at this time.     REVIEW OF SYSTEMS:  Review of Systems  Constitutional: Positive for fatigue.  All other systems reviewed and are negative.    PAST MEDICAL/SURGICAL HISTORY:  Past Medical History:  Diagnosis Date  . Bronchitis 11/2012  . Cardiomyopathy    LVEF 25%  . Diabetes mellitus   . Erectile dysfunction   . Essential hypertension   . GERD (gastroesophageal reflux disease)   . History of colonic polyps   . Mixed hyperlipidemia   . 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 Obstructive sleep apnea   . Patent foramen ovale   . Phlebitis   . Stroke [facility])    Embolic left temporal 6/62 s/p tPA  . Superficial phlebitis 12/10/2012  . Type 2 diabetes mellitus ([place])   . Vitamin D deficiency    Past Surgical History:  Procedure Laterality Date  . BIOPSY  01/11/2020   Procedure: BIOPSY;  Surgeon: Daneil Dolin, MD;  [facility];  Service: Endoscopy;;  . COLONOSCOPY WITH PROPOFOL N/A 01/11/2020   Procedure: COLONOSCOPY WITH PROPOFOL;  Surgeon: Daneil Dolin, MD;  Location[facility];  Service: Endoscopy;  Laterality: N/A;  10:15am  . NO PAST SURGERIES    . POLYPECTOMY  01/11/2020   Procedure: POLYPECTOMY;  Surgeon: Daneil Dolin, MD;  [facility];  Service: Endoscopy;;     SOCIAL HISTORY:  Social History   Socioeconomic History  . Marital status: Married    Spouse name: Not on file  . Number of children: Not on file  . Years of education: Not on file  . Highest education level: Not on file  Occupational History  . Occupation: Truck Education administrator: RETIRED  Tobacco Use  . Smoking status: Former Smoker    Types: Cigarettes    Start date: 08/06/1967    Quit date: 08/06/2003    Years since quitting: 16.5  . Smokeless tobacco: Never Used  Vaping Use  . Vaping Use: Never used  Substance and Sexual Activity  . Alcohol use: No    Alcohol/week: 0.0 standard drinks  . Drug use: No  . Sexual activity: Not Currently  Other Topics Concern  . Not on file  Social History Narrative  . Not on file   Social Determinants of Health   Financial Resource Strain:   . Difficulty of Paying Living Expenses:   Food Insecurity:   . Worried About  Running Out of Food in the Last Year:   . [place] in the Last Year:   Transportation Needs:   . Lack of Transportation (Medical):   [name] Lack of Transportation (Non-Medical):   Physical Activity:   . Days of Exercise per Week:   . Minutes of Exercise per Session:   Stress:   . Feeling of Stress :   Social Connections:   . Frequency of Communication with Friends and Family:   . Frequency of Social Gatherings with Friends and Family:   . Attends Religious Services:   . Active Member of Clubs or Organizations:   . Attends Archivist Meetings:   [name] Marital Status:   Intimate Partner Violence:   . Fear of Current or Ex-Partner:   .  Emotionally Abused:   [name] Physically Abused:   . Sexually Abused:     FAMILY HISTORY:  Family History  Problem Relation Age of Onset  . Diabetes Father   . Hypertension Other   . Colon cancer Neg Hx     CURRENT MEDICATIONS:  Outpatient Encounter Medications as of 02/25/2020  Medication Sig  . aspirin 81 MG tablet Take 81 mg by mouth daily.    . carvedilol (COREG) 6.25 MG tablet Take 1 tablet (6.25 mg total) by mouth 2 (two) times daily.  . furosemide (LASIX) 40 MG tablet Take 1 tablet (40 mg total) by mouth daily.  [name] gabapentin (NEURONTIN) 300 MG capsule Take 3 capsules (900 mg total) by mouth daily. (Patient taking differently: Take 300-600 mg by mouth See admin instructions. 300 mg during the day, 600 mg at bedtime)  . glipiZIDE (GLUCOTROL) 5 MG tablet Take 5 mg by mouth daily.   . NON FORMULARY legatrin pm Daily    . Omega-3 Fatty Acids (FISH OIL) 1000 MG CAPS Take 1,000 mg by mouth daily.   . simvastatin (ZOCOR) 40 MG tablet Take 40 mg by mouth daily at 6 PM.   . warfarin (JANTOVEN) 5 MG tablet TAKE 1 TABLET DAILY EXCEPT 2 TABLETS ON  FRIDAYS  . ondansetron (ZOFRAN) 4 MG tablet Take 1 tablet (4 mg total) by mouth every 6 (six) hours as needed for nausea. (Patient not taking: Reported on 02/25/2020)  . pantoprazole (PROTONIX) 40 MG tablet Take 1 tablet (40 mg total) by mouth 2 (two) times daily.  [name] PROAIR HFA 108 (90 BASE) MCG/ACT inhaler Inhale 2 puffs into the lungs every 4 (four) hours as needed for wheezing or shortness of breath.  (Patient not taking: Reported on 02/25/2020)   No facility-administered encounter medications on file as of 02/25/2020.    ALLERGIES:  No Known Allergies   PHYSICAL EXAM:  ECOG Performance status: 1  Vitals:   02/25/20 1107  BP: 126/73  Pulse: 73  Resp: 16  Temp: 98.5 F (36.9 C)  SpO2: 95%   Filed Weights   02/25/20 1107  Weight: (!) 203 lb 14.4 oz (92.5 kg)   Physical Exam Constitutional:      Appearance: Normal appearance. He is  normal weight.  Cardiovascular:     Rate and Rhythm: Normal rate and regular rhythm.     Heart sounds: Normal heart sounds.  Pulmonary:     Effort: Pulmonary effort is normal.     Breath sounds: Normal breath sounds.  Abdominal:     General: Bowel sounds are normal.     Palpations: Abdomen is soft.  Musculoskeletal:        General: Normal range of motion.  Skin:    General: Skin is warm.  Neurological:     Mental Status: He is alert. Mental status is at baseline.  Psychiatric:        Mood and Affect: Mood normal.        Behavior: Behavior normal.        Thought Content: Thought content normal.        Judgment: Judgment normal.      LABORATORY DATA:  I have reviewed the labs as listed.  CBC    Component Value Date/Time   WBC 4.6 02/25/2020 0958   RBC 4.25 02/25/2020 0958   HGB 11.9 (L) 02/25/2020 0958   HCT 40.3 02/25/2020 0958   PLT 194 02/25/2020 0958   MCV 94.8 02/25/2020 0958   MCH 28.0 02/25/2020 0958   MCHC 29.5 (L) 02/25/2020 0958   RDW 13.0 02/25/2020 0958   LYMPHSABS 1.4 02/25/2020 0958   MONOABS 0.3 02/25/2020 0958   EOSABS 0.3 02/25/2020 0958   BASOSABS 0.0 02/25/2020 0958   CMP Latest Ref Rng & Units 02/25/2020 01/21/2020 01/20/2020  Glucose 70 - 99 mg/dL 267(H) 145(H) 141(H)  BUN 8 - 23 mg/dL 18 30(H) 20  Creatinine 0.61 - 1.24 mg/dL 1.43(H) 1.89(H) 1.59(H)  Sodium 135 - 145 mmol/L 139 135 136  Potassium 3.5 - 5.1 mmol/L 4.0 4.7 5.6(H)  Chloride 98 - 111 mmol/L 105 101 104  CO2 22 - 32 mmol/L 24 24 23   Calcium 8.9 - 10.3 mg/dL 9.1 9.1 9.2  Total Protein 6.5 - 8.1 g/dL 7.2 - 7.6  Total Bilirubin 0.3 - 1.2 mg/dL 0.4 - 0.5  Alkaline Phos 38 - 126 U/L 57 - 56  AST 15 - 41 U/L 33 - 32  ALT 0 - 44 U/L 35 - 74(H)    All questions were answered to patient's stated satisfaction. Encouraged patient to call with any new concerns or questions before his next visit to the cancer center and we can certain see him sooner, if needed.     ASSESSMENT & PLAN:   Iron deficiency anemia 1.  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 -He reports increased fatigue over the past few months. - He has never tried oral iron in the past.  He started taking 1 iron tablet daily.  He stopped it for a while due to diarrhea and was unsure if it was due to the iron.  He has now started the oral iron back and will try it again. -Labs on 02/25/2020 showed his hemoglobin 11.9, ferritin 90 , percent saturation 19. -Patient reports his energy levels are decreased.  He does not want to try any IV iron at this time he wants to try his iron tablets again.   - We will follow-up in 6 months with repeat labs.  2.  Chronic kidney disease: -Labs done on 02/25/2020 showed his creatinine 1.43 which is increased from his baseline. -Patient reports he does not have a nephrologist. -We will refer him to nephrology.  3.  Diarrhea: -Patient reports he is having diarrhea multiple times a week.  He denies blood in his stool. -Patient reports he has not had a colonoscopy in over 10 years. -We will refer him back to his gastric doctor for checkup and colonoscopy.     Orders placed this encounter:  Orders Placed This Encounter  Procedures  . Lactate dehydrogenase  . CBC with Differential/Platelet  . Comprehensive metabolic panel  . Ferritin  . Iron and TIBC  .  Vitamin B12  . VITAMIN D 25 Hydroxy (Vit-D Deficiency, Fractures)      Francene Finders, FNP-C [place] [phone]

## 2020-02-25 NOTE — Assessment & Plan Note (Signed)
1.  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 -He reports increased fatigue over the past few months. - He has never tried oral iron in the past.  He started taking 1 iron tablet daily.  He stopped it for a while due to diarrhea and was unsure if it was due to the iron.  He has now started the oral iron back and will try it again. -Labs on 02/25/2020 showed his hemoglobin 11.9, ferritin 90 , percent saturation 19. -Patient reports his energy levels are decreased.  He does not want to try any IV iron at this time he wants to try his iron tablets again.   - We will follow-up in 6 months with repeat labs.  2.  Chronic kidney disease: -Labs done on 02/25/2020 showed his creatinine 1.43 which is increased from his baseline. -Patient reports he does not have a nephrologist. -We will refer him to nephrology.  3.  Diarrhea: -Patient reports he is having diarrhea multiple times a week.  He denies blood in his stool. -Patient reports he has not had a colonoscopy in over 10 years. -We will refer him back to his gastric doctor for checkup and colonoscopy.

## 2020-03-06 ENCOUNTER — Ambulatory Visit (INDEPENDENT_AMBULATORY_CARE_PROVIDER_SITE_OTHER): Payer: Medicare Other | Admitting: Cardiology

## 2020-03-06 ENCOUNTER — Encounter: Payer: Self-pay | Admitting: Cardiology

## 2020-03-06 ENCOUNTER — Other Ambulatory Visit: Payer: Self-pay

## 2020-03-06 VITALS — BP 116/78 | HR 74 | Ht 75.0 in | Wt 202.8 lb

## 2020-03-06 DIAGNOSIS — Z79899 Other long term (current) drug therapy: Secondary | ICD-10-CM | POA: Diagnosis not present

## 2020-03-06 DIAGNOSIS — I6350 Cerebral infarction due to unspecified occlusion or stenosis of unspecified cerebral artery: Secondary | ICD-10-CM | POA: Diagnosis not present

## 2020-03-06 DIAGNOSIS — I5042 Chronic combined systolic (congestive) and diastolic (congestive) heart failure: Secondary | ICD-10-CM

## 2020-03-06 DIAGNOSIS — Z8673 Personal history of transient ischemic attack (TIA), and cerebral infarction without residual deficits: Secondary | ICD-10-CM

## 2020-03-06 DIAGNOSIS — I255 Ischemic cardiomyopathy: Secondary | ICD-10-CM

## 2020-03-06 MED ORDER — FUROSEMIDE 40 MG PO TABS: 60.0000 mg | ORAL_TABLET | Freq: Every day | ORAL | 3 refills | Status: DC

## 2020-03-06 MED ORDER — SPIRONOLACTONE 25 MG PO TABS
12.5000 mg | ORAL_TABLET | Freq: Every day | ORAL | 3 refills | Status: DC
Start: 2020-03-06 — End: 2020-06-05

## 2020-03-06 NOTE — Progress Notes (Signed)
Cardiology Office Note  Date: 03/06/2020   ID: Dennis Yu 01/16/1946, MRN [id number]  PCP:  [name], MD  Cardiologist:  Rozann Lesches, MD Electrophysiologist:  None   Chief Complaint  Patient presents with  . Cardiac follow-up    History of Present Illness: Dennis Yu is a 74 y.o. male last seen in June by Mr. Leonides Sake NP. I reviewed the previous note as well as discussion of interval history and follow-up echocardiogram as noted below.  At the last visit he was started back on Lasix 40 mg daily, Coreg 6.25 mg twice daily, and otherwise remained on Coumadin with follow-up in the anticoagulation clinic.  He had previously also been on olmesartan and Aldactone, these were held due to hypotension.  Weight has not yet returned to baseline, he reports NYHA class II-III dyspnea.  Recent lab work is outlined below.  Creatinine 1.43 down from 1.89.  Past Medical History:  Diagnosis Date  . Bronchitis 11/2012  . Cardiomyopathy    LVEF 25%  . Diabetes mellitus   . Erectile dysfunction   . Essential hypertension   . GERD (gastroesophageal reflux disease)   . History of colonic polyps   . Mixed hyperlipidemia   . 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 Obstructive sleep apnea   . Patent foramen ovale   . Phlebitis   . 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 Superficial phlebitis 12/10/2012  . Type 2 diabetes mellitus ([place])   . Vitamin D deficiency     Past Surgical History:  Procedure Laterality Date  . BIOPSY  01/11/2020   Procedure: BIOPSY;  Surgeon: Daneil Dolin, MD;  Location: [facility];  Service: Endoscopy;;  . COLONOSCOPY WITH PROPOFOL N/A 01/11/2020   Procedure: COLONOSCOPY WITH PROPOFOL;  Surgeon: Daneil Dolin, MD;  Location: [facility];  Service: Endoscopy;  Laterality: N/A;  10:15am  . NO PAST SURGERIES    . POLYPECTOMY  01/11/2020   Procedure: POLYPECTOMY;  Surgeon: Daneil Dolin, MD;  [facility]: [facility];  Service:  Endoscopy;;    Current Outpatient Medications  Medication Sig Dispense Refill  . aspirin 81 MG tablet Take 81 mg by mouth daily.      . carvedilol (COREG) 6.25 MG tablet Take 1 tablet (6.25 mg total) by mouth 2 (two) times daily. 180 tablet 3  . furosemide (LASIX) 40 MG tablet Take 1.5 tablets (60 mg total) by mouth daily. 135 tablet 3  . gabapentin (NEURONTIN) 300 MG capsule Take 3 capsules (900 mg total) by mouth daily. (Patient taking differently: Take 300-600 mg by mouth See admin instructions. 300 mg during the day, 600 mg at bedtime) 90 capsule 11  . glipiZIDE (GLUCOTROL) 5 MG tablet Take 5 mg by mouth daily.     . NON FORMULARY legatrin pm Daily      . Omega-3 Fatty Acids (FISH OIL) 1000 MG CAPS Take 1,000 mg by mouth daily.     [name] PROAIR HFA 108 (90 BASE) MCG/ACT inhaler Inhale 2 puffs into the lungs every 4 (four) hours as needed for wheezing or shortness of breath.     . simvastatin (ZOCOR) 40 MG tablet Take 40 mg by mouth daily at 6 PM.     . warfarin (JANTOVEN) 5 MG tablet TAKE 1 TABLET DAILY EXCEPT 2 TABLETS ON  FRIDAYS 100 tablet 3  . spironolactone (ALDACTONE) 25 MG tablet Take 0.5 tablets (12.5 mg total)  by mouth daily. 45 tablet 3   No current facility-administered medications for this visit.   Allergies:  Patient has no known allergies.   ROS:   No significant leg swelling.  No orthopnea.  Physical Exam: VS:  BP 116/78   Pulse 74   Ht 6\' 3"  (1.905 m)   Wt 202 lb 12.8 oz (92 kg)   SpO2 96%   BMI 25.35 kg/m , BMI Body mass index is 25.35 kg/m.  Wt Readings from Last 3 Encounters:  03/06/20 202 lb 12.8 oz (92 kg)  02/25/20 (!) 203 lb 14.4 oz (92.5 kg)  01/31/20 197 lb (89.4 kg)    General: Patient appears comfortable at rest. HEENT: Conjunctiva and lids normal, wearing a mask. Neck: Supple, no elevated JVP or carotid bruits, no thyromegaly. Lungs: Clear to auscultation, nonlabored breathing at rest. Cardiac: Regular rate and rhythm, no S3, 2/6 systolic  murmur, no pericardial rub. Abdomen: Protuberant, bowel sounds present, no guarding or rebound. Extremities: No pitting edema, distal pulses 2+.  ECG:  An ECG dated 01/13/2020 was personally reviewed today and demonstrated:  Sinus rhythm with PVCs, old inferior infarct pattern, nonspecific ST changes.  Recent Labwork: 01/20/2020: Magnesium 2.0 02/25/2020: ALT 35; AST 33; BUN 18; Creatinine, Ser 1.43; Hemoglobin 11.9; Platelets 194; Potassium 4.0; Sodium 139     Component Value Date/Time   CHOL 145 04/05/2011 0545   TRIG 143 01/17/2020 0600   HDL 40 04/05/2011 0545   CHOLHDL 3.6 04/05/2011 0545   VLDL 13 04/05/2011 0545   LDLCALC 92 04/05/2011 0545    Other Studies Reviewed Today:  Echocardiogram 02/23/2020: 1. Left ventricular ejection fraction, by estimation, is approximately  25%. The left ventricle has severely decreased function. The left  ventricle demonstrates regional wall motion abnormalities (see scoring  diagram/findings for description). The left  ventricular internal cavity size was mildly dilated. Left ventricular  diastolic parameters are indeterminate.  2. Calcified apical LV murla thrombus present. Also prominent apical  trabeculation.  3. Right ventricular systolic function is normal. The right ventricular  size is normal. Tricuspid regurgitation signal is inadequate for assessing  PA pressure.  4. Left atrial size was mildly dilated.  5. The mitral valve is abnormal, mild to moderate annular calcification  and restricted posterior leaflet motion. Moderate mitral valve  regurgitation.  6. The aortic valve is tricuspid, moderate annular calcification. Aortic  valve regurgitation is not visualized.  7. The inferior vena cava is normal in size with greater than 50%  respiratory variability, suggesting right atrial pressure of 3 mmHg.   Assessment and Plan:  1.  Ischemic cardiomyopathy with LVEF approximately 25% and normal RV contraction.  Recent  echocardiogram noted above.  He has chronic combined heart failure and has preferred overall conservative management.  Fluid status still not optimal.  Plan is to continue Coreg at present dose, add back Aldactone 12.5 mg daily, increase Lasix to 60 mg daily.  Check BMET in the next 7 to 10 days and then clinical reassessment within a month. Might be able to work back toward low-dose ARB next (was on Benicar).  We did try Entresto previously, however he states that he was not able to afford it.  2.  History of stroke, calcified apical LV mural thrombus noted as well as prominent apical trabeculation.  He continues on Coumadin and aspirin.  3.  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  Recent creatinine 1.43 with normal potassium.  Medication Adjustments/Labs and Tests Ordered: Current medicines are reviewed at length  with the patient today.  Concerns regarding medicines are outlined above.   Tests Ordered: Orders Placed This Encounter  Procedures  . Basic metabolic panel    Medication Changes: Meds ordered this encounter  Medications  . furosemide (LASIX) 40 MG tablet    Sig: Take 1.5 tablets (60 mg total) by mouth daily.    Dispense:  135 tablet    Refill:  3    03/06/2020 dose increase  . spironolactone (ALDACTONE) 25 MG tablet    Sig: Take 0.5 tablets (12.5 mg total) by mouth daily.    Dispense:  45 tablet    Refill:  3    03/06/2020 restart    Disposition:  Follow up 1 month in the [place] office.  Signed, Satira Sark, MD, [facility] 03/06/2020 8:53 AM    [place] [facility] [place], [place], [place] [address] Phone: [phone]; Fax: [phone]

## 2020-03-06 NOTE — Patient Instructions (Addendum)
Medication Instructions:   Your physician has recommended you make the following change in your medication:   Increase furosemide to 60 mg daily. Take 1 & 1/2 of your 40 mg daily  Restart spironolactone 12.5 mg daily  Continue other medications the same  Labwork:  Your physician recommends that you return for lab work in: 7-10 days to check your BMET. This may be done at [facility] or [facility] Monday-Friday from 8:00 am - 4:00 pm. No appointment is needed.  Testing/Procedures:  NONE  Follow-Up:  Your physician recommends that you schedule a follow-up appointment in: 3-4 weeks at the [place] office.  Any Other Special Instructions Will Be Listed Below (If Applicable).  If you need a refill on your cardiac medications before your next appointment, please call your pharmacy.

## 2020-03-15 ENCOUNTER — Ambulatory Visit: Payer: Medicare Other | Admitting: Nurse Practitioner

## 2020-03-19 NOTE — Progress Notes (Deleted)
Cardiology Office Note  Date: 03/19/2020   ID: Brix, Brearley 05-May-1946, MRN [id number]  PCP:  [name], MD  Cardiologist:  Rozann Lesches, MD Electrophysiologist:  None   Chief Complaint: Ischemic cardiomyopathy  History of Present Illness: Dennis Yu is a 74 y.o. male with a history of ischemic cardiomyopathy, DM, HTN, GERD, HLD, LV thrombus, OSA, PFO, CVA, ED.  Last saw Dr. Domenic Polite on 03/06/2020.  Weight had not yet returned to baseline.  He reported NYHA class II-III dyspnea.  Recent lab work showed creatinine of 1.43 down from 1.89.  Plan was to continue Coreg at present dose.  Add back Aldactone 12.5 mg daily, increase Lasix to 60 mg daily.  Check BMP in the next 7 to 10 days and clinical reassessment in a month.  Might be able to work back towards low dose ARB next (was on Benicar).  He was not able to hold for [facility].  He was continuing on Coumadin and aspirin for history of CVA and LV thrombus.  His CKD stage IIIb was at baseline with recent creatinine 1.43  Past Medical History:  Diagnosis Date   Bronchitis 11/2012   Cardiomyopathy    LVEF 25%   Diabetes mellitus    Erectile dysfunction    Essential hypertension    GERD (gastroesophageal reflux disease)    History of colonic polyps    Mixed hyperlipidemia    Mural thrombus of left ventricle    Obstructive sleep apnea    Patent foramen ovale    Phlebitis    Stroke ([place])    Embolic left temporal 8/65 s/p tPA   Superficial phlebitis 12/10/2012   Type 2 diabetes mellitus ([place])    Vitamin D deficiency     Past Surgical History:  Procedure Laterality Date   BIOPSY  01/11/2020   Procedure: BIOPSY;  Surgeon: Daneil Dolin, MD;  [facility];  Service: Endoscopy;;   COLONOSCOPY WITH PROPOFOL N/A 01/11/2020   Procedure: COLONOSCOPY WITH PROPOFOL;  Surgeon: Daneil Dolin, MD;  [facility];  Service: Endoscopy;  Laterality: N/A;  10:15am   NO PAST SURGERIES       POLYPECTOMY  01/11/2020   Procedure: POLYPECTOMY;  Surgeon: Daneil Dolin, MD;  [facility];  Service: Endoscopy;;    Current Outpatient Medications  Medication Sig Dispense Refill   aspirin 81 MG tablet Take 81 mg by mouth daily.       carvedilol (COREG) 6.25 MG tablet Take 1 tablet (6.25 mg total) by mouth 2 (two) times daily. 180 tablet 3   furosemide (LASIX) 40 MG tablet Take 1.5 tablets (60 mg total) by mouth daily. 135 tablet 3   gabapentin (NEURONTIN) 300 MG capsule Take 3 capsules (900 mg total) by mouth daily. (Patient taking differently: Take 300-600 mg by mouth See admin instructions. 300 mg during the day, 600 mg at bedtime) 90 capsule 11   glipiZIDE (GLUCOTROL) 5 MG tablet Take 5 mg by mouth daily.      NON FORMULARY legatrin pm Daily       Omega-3 Fatty Acids (FISH OIL) 1000 MG CAPS Take 1,000 mg by mouth daily.      PROAIR HFA 108 (90 BASE) MCG/ACT inhaler Inhale 2 puffs into the lungs every 4 (four) hours as needed for wheezing or shortness of breath.      simvastatin (ZOCOR) 40 MG tablet Take 40 mg by mouth daily at 6 PM.  spironolactone (ALDACTONE) 25 MG tablet Take 0.5 tablets (12.5 mg total) by mouth daily. 45 tablet 3   warfarin (JANTOVEN) 5 MG tablet TAKE 1 TABLET DAILY EXCEPT 2 TABLETS ON  FRIDAYS 100 tablet 3   No current facility-administered medications for this visit.   Allergies:  Patient has no known allergies.   Social History: The patient  reports that he quit smoking about 16 years ago. His smoking use included cigarettes. He started smoking about 52 years ago. He has never used smokeless tobacco. He reports that he does not drink alcohol and does not use drugs.   Family History: The patient's family history includes Diabetes in his father; Hypertension in an other family member.   ROS:  Please see the history of present illness. Otherwise, complete review of systems is positive for {NONE DEFAULTED:18576::"none"}.  All other  systems are reviewed and negative.   Physical Exam: VS:  There were no vitals taken for this visit., BMI There is no height or weight on file to calculate BMI.  Wt Readings from Last 3 Encounters:  03/06/20 202 lb 12.8 oz (92 kg)  02/25/20 (!) 203 lb 14.4 oz (92.5 kg)  01/31/20 197 lb (89.4 kg)    General: Patient appears comfortable at rest. HEENT: Conjunctiva and lids normal, oropharynx clear with moist mucosa. Neck: Supple, no elevated JVP or carotid bruits, no thyromegaly. Lungs: Clear to auscultation, nonlabored breathing at rest. Cardiac: Regular rate and rhythm, no S3 or significant systolic murmur, no pericardial rub. Abdomen: Soft, nontender, no hepatomegaly, bowel sounds present, no guarding or rebound. Extremities: No pitting edema, distal pulses 2+. Skin: Warm and dry. Musculoskeletal: No kyphosis. Neuropsychiatric: Alert and oriented x3, affect grossly appropriate.  ECG:  {EKG/Telemetry Strips [phone]}  Recent Labwork: 01/20/2020: Magnesium 2.0 02/25/2020: ALT 35; AST 33; BUN 18; Creatinine, Ser 1.43; Hemoglobin 11.9; Platelets 194; Potassium 4.0; Sodium 139     Component Value Date/Time   CHOL 145 04/05/2011 0545   TRIG 143 01/17/2020 0600   HDL 40 04/05/2011 0545   CHOLHDL 3.6 04/05/2011 0545   VLDL 13 04/05/2011 0545   LDLCALC 92 04/05/2011 0545    Other Studies Reviewed Today:  Echocardiogram 02/23/2020: 1. Left ventricular ejection fraction, by estimation, is approximately  25%. The left ventricle has severely decreased function. The left  ventricle demonstrates regional wall motion abnormalities (see scoring  diagram/findings for description). The left  ventricular internal cavity size was mildly dilated. Left ventricular  diastolic parameters are indeterminate.  2. Calcified apical LV murla thrombus present. Also prominent apical  trabeculation.  3. Right ventricular systolic function is normal. The right ventricular  size is normal.  Tricuspid regurgitation signal is inadequate for assessing  PA pressure.  4. Left atrial size was mildly dilated.  5. The mitral valve is abnormal, mild to moderate annular calcification  and restricted posterior leaflet motion. Moderate mitral valve  regurgitation.  6. The aortic valve is tricuspid, moderate annular calcification. Aortic  valve regurgitation is not visualized.  7. The inferior vena cava is normal in size with greater than 50%  respiratory variability, suggesting right atrial pressure of 3 mmHg.   Assessment and Plan:  1. Ischemic cardiomyopathy   2. History of CVA (cerebrovascular accident)   3. Stage 3b chronic kidney disease    1. Ischemic cardiomyopathy ***  2. History of CVA (cerebrovascular accident)  / LV thrombus ***  3. Stage 3b chronic kidney disease ***  Medication Adjustments/Labs and Tests Ordered: Current medicines are reviewed at length  with the patient today.  Concerns regarding medicines are outlined above.   Disposition: Follow-up with ***  Signed, Levell July, NP 03/19/2020 9:32 PM    [place] at [facility] [place], [place], [place] [address] Phone: [phone]; Fax: [phone]

## 2020-03-20 ENCOUNTER — Ambulatory Visit (INDEPENDENT_AMBULATORY_CARE_PROVIDER_SITE_OTHER): Payer: Medicare Other | Admitting: *Deleted

## 2020-03-20 DIAGNOSIS — Z5181 Encounter for therapeutic drug level monitoring: Secondary | ICD-10-CM

## 2020-03-20 DIAGNOSIS — Z7901 Long term (current) use of anticoagulants: Secondary | ICD-10-CM

## 2020-03-20 DIAGNOSIS — N1832 Chronic kidney disease, stage 3b: Secondary | ICD-10-CM

## 2020-03-20 DIAGNOSIS — I513 Intracardiac thrombosis, not elsewhere classified: Secondary | ICD-10-CM

## 2020-03-20 DIAGNOSIS — I255 Ischemic cardiomyopathy: Secondary | ICD-10-CM | POA: Diagnosis not present

## 2020-03-20 DIAGNOSIS — I639 Cerebral infarction, unspecified: Secondary | ICD-10-CM | POA: Diagnosis not present

## 2020-03-20 DIAGNOSIS — Z86718 Personal history of other venous thrombosis and embolism: Secondary | ICD-10-CM | POA: Diagnosis not present

## 2020-03-20 DIAGNOSIS — Z8673 Personal history of transient ischemic attack (TIA), and cerebral infarction without residual deficits: Secondary | ICD-10-CM

## 2020-03-20 DIAGNOSIS — I6350 Cerebral infarction due to unspecified occlusion or stenosis of unspecified cerebral artery: Secondary | ICD-10-CM

## 2020-03-20 LAB — POCT INR: INR: 2.7 (ref 2.0–3.0)

## 2020-03-20 NOTE — Patient Instructions (Signed)
Continue 1 tablet daily except 2 tablets on Fridays.  Recheck in 4 weeks

## 2020-03-24 ENCOUNTER — Other Ambulatory Visit: Payer: Self-pay

## 2020-03-24 ENCOUNTER — Encounter (HOSPITAL_COMMUNITY): Payer: Self-pay

## 2020-03-24 DIAGNOSIS — Z5321 Procedure and treatment not carried out due to patient leaving prior to being seen by health care provider: Secondary | ICD-10-CM | POA: Diagnosis not present

## 2020-03-24 DIAGNOSIS — R739 Hyperglycemia, unspecified: Secondary | ICD-10-CM | POA: Diagnosis present

## 2020-03-24 LAB — CBC
HCT: 40.7 % (ref 39.0–52.0)
Hemoglobin: 12.4 g/dL — ABNORMAL LOW (ref 13.0–17.0)
MCH: 27.9 pg (ref 26.0–34.0)
MCHC: 30.5 g/dL (ref 30.0–36.0)
MCV: 91.7 fL (ref 80.0–100.0)
Platelets: 438 10*3/uL — ABNORMAL HIGH (ref 150–400)
RBC: 4.44 MIL/uL (ref 4.22–5.81)
RDW: 12.4 % (ref 11.5–15.5)
WBC: 17.2 10*3/uL — ABNORMAL HIGH (ref 4.0–10.5)
nRBC: 0 % (ref 0.0–0.2)

## 2020-03-24 LAB — BASIC METABOLIC PANEL
Anion gap: 10 (ref 5–15)
BUN: 29 mg/dL — ABNORMAL HIGH (ref 8–23)
CO2: 23 mmol/L (ref 22–32)
Calcium: 9.1 mg/dL (ref 8.9–10.3)
Chloride: 103 mmol/L (ref 98–111)
Creatinine, Ser: 1.52 mg/dL — ABNORMAL HIGH (ref 0.61–1.24)
GFR calc Af Amer: 52 mL/min — ABNORMAL LOW (ref >60)
GFR calc non Af Amer: 45 mL/min — ABNORMAL LOW (ref >60)
Glucose, Bld: 169 mg/dL — ABNORMAL HIGH (ref 70–99)
Potassium: 4.4 mmol/L (ref 3.5–5.1)
Sodium: 136 mmol/L (ref 135–145)

## 2020-03-24 LAB — CBG MONITORING, ED: Glucose-Capillary: 183 mg/dL — ABNORMAL HIGH (ref 70–99)

## 2020-03-24 NOTE — ED Triage Notes (Signed)
Pt states he has not felt well for about 3 weeks, states sugars running in the 300's, had not seen his pmd.  Pt c/o nausea, no vomiting.

## 2020-03-25 ENCOUNTER — Emergency Department (HOSPITAL_COMMUNITY)
Admission: EM | Admit: 2020-03-25 | Discharge: 2020-03-25 | Disposition: A | Discharge status: Home / Self Care | Payer: Medicare Other | Attending: Emergency Medicine | Admitting: Emergency Medicine

## 2020-03-25 DIAGNOSIS — R5382 Chronic fatigue, unspecified: Secondary | ICD-10-CM | POA: Diagnosis not present

## 2020-03-25 DIAGNOSIS — R630 Anorexia: Secondary | ICD-10-CM | POA: Diagnosis not present

## 2020-03-25 DIAGNOSIS — I255 Ischemic cardiomyopathy: Secondary | ICD-10-CM | POA: Diagnosis not present

## 2020-03-27 ENCOUNTER — Encounter: Payer: Self-pay | Admitting: *Deleted

## 2020-03-27 ENCOUNTER — Telehealth: Payer: Self-pay | Admitting: Cardiology

## 2020-03-27 DIAGNOSIS — R0602 Shortness of breath: Secondary | ICD-10-CM | POA: Diagnosis not present

## 2020-03-27 DIAGNOSIS — I129 Hypertensive chronic kidney disease with stage 1 through stage 4 chronic kidney disease, or unspecified chronic kidney disease: Secondary | ICD-10-CM | POA: Diagnosis not present

## 2020-03-27 DIAGNOSIS — Z79899 Other long term (current) drug therapy: Secondary | ICD-10-CM

## 2020-03-27 DIAGNOSIS — R944 Abnormal results of kidney function studies: Secondary | ICD-10-CM | POA: Diagnosis not present

## 2020-03-27 DIAGNOSIS — N1831 Chronic kidney disease, stage 3a: Secondary | ICD-10-CM | POA: Diagnosis not present

## 2020-03-27 DIAGNOSIS — E0822 Diabetes mellitus due to underlying condition with diabetic chronic kidney disease: Secondary | ICD-10-CM | POA: Diagnosis not present

## 2020-03-27 DIAGNOSIS — E782 Mixed hyperlipidemia: Secondary | ICD-10-CM | POA: Diagnosis not present

## 2020-03-27 DIAGNOSIS — I482 Chronic atrial fibrillation, unspecified: Secondary | ICD-10-CM | POA: Diagnosis not present

## 2020-03-27 DIAGNOSIS — R1084 Generalized abdominal pain: Secondary | ICD-10-CM | POA: Diagnosis not present

## 2020-03-27 DIAGNOSIS — E119 Type 2 diabetes mellitus without complications: Secondary | ICD-10-CM | POA: Diagnosis not present

## 2020-03-27 DIAGNOSIS — G9009 Other idiopathic peripheral autonomic neuropathy: Secondary | ICD-10-CM | POA: Diagnosis not present

## 2020-03-27 DIAGNOSIS — D509 Iron deficiency anemia, unspecified: Secondary | ICD-10-CM | POA: Diagnosis not present

## 2020-03-27 DIAGNOSIS — I10 Essential (primary) hypertension: Secondary | ICD-10-CM | POA: Diagnosis not present

## 2020-03-27 NOTE — Addendum Note (Signed)
Addended by: Levonne Hubert on: 03/27/2020 05:12 PM   Modules accepted: Orders

## 2020-03-27 NOTE — Telephone Encounter (Signed)
Please see my most recent office note in August.  I already started him back on Aldactone and increased his Lasix.  He should be due for a follow-up visit relatively soon based on my last note.  I would wait until he is seen with repeat BMET before we try him again on Entresto.

## 2020-03-27 NOTE — Telephone Encounter (Signed)
Wife notified and voiced understanding. Labs requested from PCP and lab slip mailed.

## 2020-03-27 NOTE — Telephone Encounter (Signed)
New message    Patient wife called , patient was in ED this weekend and they said his heart is only pumping 25 percent and she would like to start the [place] again , can you call it in  RX on [address] in [place]

## 2020-03-28 ENCOUNTER — Telehealth: Payer: Self-pay | Admitting: Cardiology

## 2020-03-28 NOTE — Telephone Encounter (Signed)
Dennis Yu states she is the niece of Pt and feels he should be seen sooner than Sept 7th   PLease call Dennis Yu [phone]     Thanks renee

## 2020-03-29 ENCOUNTER — Emergency Department (HOSPITAL_COMMUNITY): Payer: Medicare Other

## 2020-03-29 ENCOUNTER — Emergency Department (HOSPITAL_COMMUNITY)
Admission: EM | Admit: 2020-03-29 | Discharge: 2020-03-29 | Disposition: A | Discharge status: Home / Self Care | Payer: Medicare Other | Attending: Emergency Medicine | Admitting: Emergency Medicine

## 2020-03-29 ENCOUNTER — Other Ambulatory Visit: Payer: Self-pay

## 2020-03-29 ENCOUNTER — Encounter (HOSPITAL_COMMUNITY): Payer: Self-pay | Admitting: Emergency Medicine

## 2020-03-29 DIAGNOSIS — E119 Type 2 diabetes mellitus without complications: Secondary | ICD-10-CM | POA: Diagnosis not present

## 2020-03-29 DIAGNOSIS — R1013 Epigastric pain: Secondary | ICD-10-CM | POA: Diagnosis not present

## 2020-03-29 DIAGNOSIS — K5900 Constipation, unspecified: Secondary | ICD-10-CM | POA: Diagnosis not present

## 2020-03-29 DIAGNOSIS — Z7901 Long term (current) use of anticoagulants: Secondary | ICD-10-CM | POA: Diagnosis not present

## 2020-03-29 DIAGNOSIS — Z7982 Long term (current) use of aspirin: Secondary | ICD-10-CM | POA: Diagnosis not present

## 2020-03-29 DIAGNOSIS — Z7984 Long term (current) use of oral hypoglycemic drugs: Secondary | ICD-10-CM | POA: Diagnosis not present

## 2020-03-29 DIAGNOSIS — K279 Peptic ulcer, site unspecified, unspecified as acute or chronic, without hemorrhage or perforation: Secondary | ICD-10-CM | POA: Diagnosis not present

## 2020-03-29 DIAGNOSIS — R1084 Generalized abdominal pain: Secondary | ICD-10-CM | POA: Diagnosis not present

## 2020-03-29 DIAGNOSIS — Z87891 Personal history of nicotine dependence: Secondary | ICD-10-CM | POA: Insufficient documentation

## 2020-03-29 DIAGNOSIS — K429 Umbilical hernia without obstruction or gangrene: Secondary | ICD-10-CM | POA: Diagnosis not present

## 2020-03-29 DIAGNOSIS — I2510 Atherosclerotic heart disease of native coronary artery without angina pectoris: Secondary | ICD-10-CM | POA: Insufficient documentation

## 2020-03-29 DIAGNOSIS — Z20822 Contact with and (suspected) exposure to covid-19: Secondary | ICD-10-CM | POA: Diagnosis not present

## 2020-03-29 DIAGNOSIS — Z79899 Other long term (current) drug therapy: Secondary | ICD-10-CM | POA: Insufficient documentation

## 2020-03-29 DIAGNOSIS — K271 Acute peptic ulcer, site unspecified, with perforation: Secondary | ICD-10-CM | POA: Diagnosis not present

## 2020-03-29 DIAGNOSIS — K5750 Diverticulosis of both small and large intestine without perforation or abscess without bleeding: Secondary | ICD-10-CM | POA: Diagnosis not present

## 2020-03-29 DIAGNOSIS — R0781 Pleurodynia: Secondary | ICD-10-CM | POA: Diagnosis not present

## 2020-03-29 DIAGNOSIS — I10 Essential (primary) hypertension: Secondary | ICD-10-CM | POA: Insufficient documentation

## 2020-03-29 DIAGNOSIS — R5381 Other malaise: Secondary | ICD-10-CM | POA: Diagnosis not present

## 2020-03-29 DIAGNOSIS — R531 Weakness: Secondary | ICD-10-CM | POA: Diagnosis present

## 2020-03-29 DIAGNOSIS — K3189 Other diseases of stomach and duodenum: Secondary | ICD-10-CM | POA: Diagnosis not present

## 2020-03-29 DIAGNOSIS — R52 Pain, unspecified: Secondary | ICD-10-CM | POA: Diagnosis not present

## 2020-03-29 DIAGNOSIS — I700 Atherosclerosis of aorta: Secondary | ICD-10-CM | POA: Diagnosis not present

## 2020-03-29 LAB — COMPREHENSIVE METABOLIC PANEL
ALT: 24 U/L (ref 0–44)
AST: 20 U/L (ref 15–41)
Albumin: 3.1 g/dL — ABNORMAL LOW (ref 3.5–5.0)
Alkaline Phosphatase: 58 U/L (ref 38–126)
Anion gap: 8 (ref 5–15)
BUN: 20 mg/dL (ref 8–23)
CO2: 23 mmol/L (ref 22–32)
Calcium: 9 mg/dL (ref 8.9–10.3)
Chloride: 105 mmol/L (ref 98–111)
Creatinine, Ser: 1.34 mg/dL — ABNORMAL HIGH (ref 0.61–1.24)
GFR calc Af Amer: >60 mL/min (ref >60)
GFR calc non Af Amer: 53 mL/min — ABNORMAL LOW (ref >60)
Glucose, Bld: 226 mg/dL — ABNORMAL HIGH (ref 70–99)
Potassium: 4.4 mmol/L (ref 3.5–5.1)
Sodium: 136 mmol/L (ref 135–145)
Total Bilirubin: <0.1 mg/dL — ABNORMAL LOW (ref 0.3–1.2)
Total Protein: 7 g/dL (ref 6.5–8.1)

## 2020-03-29 LAB — CBC
HCT: 36.3 % — ABNORMAL LOW (ref 39.0–52.0)
Hemoglobin: 11 g/dL — ABNORMAL LOW (ref 13.0–17.0)
MCH: 27.8 pg (ref 26.0–34.0)
MCHC: 30.3 g/dL (ref 30.0–36.0)
MCV: 91.7 fL (ref 80.0–100.0)
Platelets: 408 10*3/uL — ABNORMAL HIGH (ref 150–400)
RBC: 3.96 MIL/uL — ABNORMAL LOW (ref 4.22–5.81)
RDW: 12.2 % (ref 11.5–15.5)
WBC: 11.7 10*3/uL — ABNORMAL HIGH (ref 4.0–10.5)
nRBC: 0 % (ref 0.0–0.2)

## 2020-03-29 LAB — URINALYSIS, ROUTINE W REFLEX MICROSCOPIC
Bilirubin Urine: NEGATIVE
Glucose, UA: NEGATIVE mg/dL
Hgb urine dipstick: NEGATIVE
Ketones, ur: NEGATIVE mg/dL
Leukocytes,Ua: NEGATIVE
Nitrite: NEGATIVE
Protein, ur: NEGATIVE mg/dL
Specific Gravity, Urine: 1.024 (ref 1.005–1.030)
pH: 5 (ref 5.0–8.0)

## 2020-03-29 LAB — TROPONIN I (HIGH SENSITIVITY)
Troponin I (High Sensitivity): 12 ng/L (ref <18)
Troponin I (High Sensitivity): 13 ng/L (ref <18)

## 2020-03-29 LAB — LIPASE, BLOOD: Lipase: 41 U/L (ref 11–51)

## 2020-03-29 LAB — PROTIME-INR
INR: 2.4 — ABNORMAL HIGH (ref 0.8–1.2)
Prothrombin Time: 25 seconds — ABNORMAL HIGH (ref 11.4–15.2)

## 2020-03-29 LAB — SARS CORONAVIRUS 2 BY RT PCR (HOSPITAL ORDER, PERFORMED IN ~~LOC~~ HOSPITAL LAB): SARS Coronavirus 2: NEGATIVE

## 2020-03-29 MED ORDER — OXYCODONE-ACETAMINOPHEN 5-325 MG PO TABS
1.0000 | ORAL_TABLET | Freq: Once | ORAL | Status: AC
  Administered 2020-03-29: 1 via ORAL
  Filled 2020-03-29: qty 1

## 2020-03-29 MED ORDER — ACETAMINOPHEN 325 MG PO TABS
650.0000 mg | ORAL_TABLET | Freq: Once | ORAL | Status: AC
  Administered 2020-03-29: 650 mg via ORAL
  Filled 2020-03-29: qty 2

## 2020-03-29 MED ORDER — PANTOPRAZOLE SODIUM 40 MG PO TBEC: 40.0000 mg | DELAYED_RELEASE_TABLET | Freq: Every day | ORAL | 0 refills | Status: DC

## 2020-03-29 MED ORDER — IOHEXOL 300 MG/ML  SOLN
100.0000 mL | Freq: Once | INTRAMUSCULAR | Status: AC | PRN
  Administered 2020-03-29: 100 mL via INTRAVENOUS

## 2020-03-29 MED ORDER — OXYCODONE-ACETAMINOPHEN 5-325 MG PO TABS
1.0000 | ORAL_TABLET | Freq: Four times a day (QID) | ORAL | 0 refills | Status: AC | PRN
Start: 2020-03-29 — End: 2020-04-01

## 2020-03-29 MED ORDER — DICYCLOMINE HCL 20 MG PO TABS: 20.0000 mg | ORAL_TABLET | Freq: Two times a day (BID) | ORAL | 0 refills | Status: DC | PRN

## 2020-03-29 MED ORDER — AMOXICILLIN-POT CLAVULANATE 875-125 MG PO TABS: 1.0000 | ORAL_TABLET | Freq: Two times a day (BID) | ORAL | 0 refills | Status: DC

## 2020-03-29 NOTE — ED Triage Notes (Signed)
Pt to triage via [facility] from home.  Reports pain under bilateral ribs since Friday/Saturday.  Seen at [facility] on 8/20 and [facility].  Seen by PCP on Monday.  Reports generalized weakness, fatigue, and decreased appetite since having colonoscopy in June.  States he always has to take laxative to have BM but hasn't had one in 4 days.  States abd feels tight.  Denies nausea and vomiting.  CBG 229.

## 2020-03-29 NOTE — ED Provider Notes (Signed)
[place] EMERGENCY DEPARTMENT Provider Note   CSN: [id number] Arrival date & time: 03/29/20  1043     History Chief Complaint  Patient presents with  . Abdominal Pain    Dennis Yu is a 73 y.o. male.  Presents to ER with myriad symptoms.  Reports ever since his admission in June, he has had generalized weakness, fatigue, poor appetite.  He occasionally has some upper abdominal discomfort but normally does not have any frank pain.  Describes fullness sensation.  He has been seen by his primary doctor and cardiologist, they report they did not have any answers.  He has had increased constipation but is able to have a bowel movement whenever he takes a laxative.  Had a bowel movement earlier today.  No fevers, no vomiting, no diarrhea.  Is not taking a proton pump inhibitor currently.  Completed chart review, recent admission in June, suspected perforated duodenal ulcer, managed conservatively with antibiotics.  HPI     Past Medical History:  Diagnosis Date  . Bronchitis 11/2012  . Cardiomyopathy    LVEF 25%  . Diabetes mellitus   . Erectile dysfunction   . Essential hypertension   . GERD (gastroesophageal reflux disease)   . History of colonic polyps   . Mixed hyperlipidemia   . 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 Obstructive sleep apnea   . Patent foramen ovale   . Phlebitis   . 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 Superficial phlebitis 12/10/2012  . Type 2 diabetes mellitus ([place])   . Vitamin D deficiency     Patient Active Problem List   Diagnosis Date Noted  . Perforated duodenal ulcer ([place]) 01/13/2020  . Diarrhea 11/17/2019  . History of colonic polyps 11/17/2019  . Iron deficiency anemia 01/15/2019  . Encounter for therapeutic drug monitoring 09/02/2013  . Superficial phlebitis 12/10/2012  . 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 Long term (current) use of anticoagulants 05/27/2011  . Embolic stroke ([place])  18/84/1660  . Patent foramen ovale 05/27/2011  . Coronary atherosclerosis of native coronary artery 05/27/2011  . Mural thrombus of left ventricle 05/27/2011  . DIABETES MELLITUS, TYPE II, CONTROLLED, W/NEURO COMPS 07/01/2006  . Mixed hyperlipidemia 07/01/2006  . Essential hypertension, benign 07/01/2006    Past Surgical History:  Procedure Laterality Date  . BIOPSY  01/11/2020   Procedure: BIOPSY;  Surgeon: Daneil Dolin, MD;  Location: [facility] END[facility];  Service: Endoscopy;;  . COLONOSCOPY WITH PROPOFOL N/A 01/11/2020   Procedure: COLONOSCOPY WITH PROPOFOL;  Surgeon: Daneil Dolin, MD;  Location: [facility] ENDO SUITE;  Service: Endoscopy;  Laterality: N/A;  10:15am  . NO PAST SURGERIES    . POLYPECTOMY  01/11/2020   Procedure: POLYPECTOMY;  Surgeon: Daneil Dolin, MD;  Location: [facility] ENDO SUITE;  Service: Endoscopy;;       Family History  Problem Relation Age of Onset  . Diabetes Father   . Hypertension Other   . Colon cancer Neg Hx     Social History   Tobacco Use  . Smoking status: Former Smoker    Types: Cigarettes    Start date: 08/06/1967    Quit date: 08/06/2003    Years since quitting: 16.6  . Smokeless tobacco: Never Used  Vaping Use  . Vaping Use: Never used  Substance Use Topics  . Alcohol use: No    Alcohol/week: 0.0 standard drinks  . Drug use: No    Home Medications  Prior to Admission medications   Medication Sig Start Date End Date Taking? Authorizing Provider  amoxicillin-clavulanate (AUGMENTIN) 875-125 MG tablet Take 1 tablet by mouth 2 (two) times daily for 7 days. 03/29/20 04/05/20  Lucrezia Starch, MD  aspirin 81 MG tablet Take 81 mg by mouth daily.      [provider]  carvedilol (COREG) 6.25 MG tablet Take 1 tablet (6.25 mg total) by mouth 2 (two) times daily. 01/31/20   Verta Ellen., NP  dicyclomine (BENTYL) 20 MG tablet Take 1 tablet (20 mg total) by mouth 2 (two) times daily as needed for spasms. 03/29/20   Lucrezia Starch, MD    furosemide (LASIX) 40 MG tablet Take 1.5 tablets (60 mg total) by mouth daily. 03/06/20 06/04/20  Satira Sark, MD  gabapentin (NEURONTIN) 300 MG capsule Take 3 capsules (900 mg total) by mouth daily. Patient taking differently: Take 300-600 mg by mouth See admin instructions. 300 mg during the day, 600 mg at bedtime 12/08/13   Baird Cancer, PA-C  glipiZIDE (GLUCOTROL) 5 MG tablet Take 5 mg by mouth daily.  04/11/19   [provider]  NON FORMULARY legatrin pm Daily      [provider]  Omega-3 Fatty Acids (FISH OIL) 1000 MG CAPS Take 1,000 mg by mouth daily.     [provider]  oxyCODONE-acetaminophen (PERCOCET) 5-325 MG tablet Take 1 tablet by mouth every 6 (six) hours as needed for up to 3 days for severe pain. 03/29/20 04/01/20  Lucrezia Starch, MD  pantoprazole (PROTONIX) 40 MG tablet Take 1 tablet (40 mg total) by mouth daily. 03/29/20   Lucrezia Starch, MD  PROAIR HFA 108 (90 BASE) MCG/ACT inhaler Inhale 2 puffs into the lungs every 4 (four) hours as needed for wheezing or shortness of breath.  12/02/12   [provider]  simvastatin (ZOCOR) 40 MG tablet Take 40 mg by mouth daily at 6 PM.  02/17/18   [provider]  spironolactone (ALDACTONE) 25 MG tablet Take 0.5 tablets (12.5 mg total) by mouth daily. 03/06/20 06/04/20  Satira Sark, MD  warfarin (JANTOVEN) 5 MG tablet TAKE 1 TABLET DAILY EXCEPT 2 TABLETS ON  FRIDAYS 01/31/20   Satira Sark, MD    Allergies    Patient has no known allergies.  Review of Systems   Review of Systems  Constitutional: Positive for appetite change and fatigue. Negative for chills and fever.  HENT: Negative for ear pain and sore throat.   Eyes: Negative for pain and visual disturbance.  Respiratory: Negative for cough and shortness of breath.   Cardiovascular: Negative for chest pain and palpitations.  Gastrointestinal: Positive for abdominal distention and abdominal pain. Negative for  vomiting.  Genitourinary: Negative for dysuria and hematuria.  Musculoskeletal: Negative for arthralgias and back pain.  Skin: Negative for color change and rash.  Neurological: Negative for seizures and syncope.  All other systems reviewed and are negative.   Physical Exam Updated Vital Signs BP 132/87   Pulse 78   Temp 98.2 F (36.8 C) (Oral)   Resp 20   SpO2 99%   Physical Exam Vitals and nursing note reviewed.  Constitutional:      Appearance: He is well-developed.  HENT:     Head: Normocephalic and atraumatic.  Eyes:     Conjunctiva/sclera: Conjunctivae normal.  Cardiovascular:     Rate and Rhythm: Normal rate and regular rhythm.     Heart sounds: No murmur  heard.   Pulmonary:     Effort: Pulmonary effort is normal. No respiratory distress.     Breath sounds: Normal breath sounds.  Abdominal:     General: Bowel sounds are normal. There is no distension.     Palpations: Abdomen is soft.     Tenderness: There is no abdominal tenderness. There is no rebound.  Musculoskeletal:     Cervical back: Neck supple.  Skin:    General: Skin is warm and dry.     Capillary Refill: Capillary refill takes less than 2 seconds.  Neurological:     General: No focal deficit present.     Mental Status: He is alert.     ED Results / Procedures / Treatments   Labs (all labs ordered are listed, but only abnormal results are displayed) Labs Reviewed  COMPREHENSIVE METABOLIC PANEL - Abnormal; Notable for the following components:      Result Value   Glucose, Bld 226 (*)    Creatinine, Ser 1.34 (*)    Albumin 3.1 (*)    Total Bilirubin <0.1 (*)    GFR calc non Af Amer 53 (*)    All other components within normal limits  CBC - Abnormal; Notable for the following components:   WBC 11.7 (*)    RBC 3.96 (*)    Hemoglobin 11.0 (*)    HCT 36.3 (*)    Platelets 408 (*)    All other components within normal limits  PROTIME-INR - Abnormal; Notable for the following components:    Prothrombin Time 25.0 (*)    INR 2.4 (*)    All other components within normal limits  SARS CORONAVIRUS 2 BY RT PCR (HOSPITAL ORDER, [facility])  LIPASE, BLOOD  URINALYSIS, ROUTINE W REFLEX MICROSCOPIC  TROPONIN I (HIGH SENSITIVITY)  TROPONIN I (HIGH SENSITIVITY)    EKG EKG Interpretation  Date/Time:  Wednesday March 29 2020 10:48:43 EDT Ventricular Rate:  87 PR Interval:  166 QRS Duration: 92 QT Interval:  362 QTC Calculation: 435 R Axis:   -47 Text Interpretation: Sinus rhythm with occasional Premature ventricular complexes Left axis deviation Possible Inferior infarct , age undetermined Anterior infarct , age undetermined T wave abnormality, consider lateral ischemia Abnormal ECG no acute change when compared to ecg on 01/13/20 Confirmed by Madalyn Rob [phone]) on 03/29/2020 5:50:12 PM   Radiology DG Chest 2 View  Result Date: 03/29/2020 CLINICAL DATA:  Acute bilateral rib pain. EXAM: CHEST - 2 VIEW COMPARISON:  None. FINDINGS: The heart size and mediastinal contours are within normal limits. Both lungs are clear. No pneumothorax or pleural effusion is noted. The visualized skeletal structures are unremarkable. IMPRESSION: No active cardiopulmonary disease. Electronically Signed   By: Marijo Conception M.D.   On: 03/29/2020 11:20   CT ABDOMEN PELVIS W CONTRAST  Result Date: 03/29/2020 CLINICAL DATA:  Acute nonlocalized abdominal pain. History of duodenal ulcer perforation, concern for abscess. Patient reports generalized weakness, fatigue and diminished appetite since colonoscopy in June. No bowel movement for 4 days. EXAM: CT ABDOMEN AND PELVIS WITH CONTRAST TECHNIQUE: Multidetector CT imaging of the abdomen and pelvis was performed using the standard protocol following bolus administration of intravenous contrast. CONTRAST:  150m OMNIPAQUE IOHEXOL 300 MG/ML  SOLN COMPARISON:  Abdominal CT 01/13/2020 FINDINGS: Lower chest: Basilar emphysema. No  consolidation or pleural fluid. Subsegmental atelectasis in the left lower lobe. Decompressed distal esophagus. Hepatobiliary: No focal hepatic abnormality. Unusual configuration of the liver with gallbladder directed posteriorly, unchanged  from prior exam. There is no biliary dilatation or calcified gallstone. Pancreas: No ductal dilatation or inflammation. Spleen: Normal in size without focal abnormality. Adrenals/Urinary Tract: No adrenal nodule. No hydronephrosis or perinephric edema. Homogeneous renal enhancement with symmetric excretion on delayed phase imaging. Urinary bladder is near completely empty. Stomach/Bowel: Again noted to be fat stranding and inflammation adjacent to the first, second and third portions of the duodenum with wall thickening. There is mild mucosal enhancement. Fluid within the duodenal bulb and third portion of the duodenum without evidence of perforation. There is a medially projecting duodenal diverticulum arising from the descending duodenum, series 3, image 35. Large diverticulum involving the duodenal jejunal junction without inflammation, series 3, image 31. Inflammatory changes extend into the right upper quadrant where there is wall thickening of the adjacent to the hepatic flexure of the colon. Scattered fluid within nondilated or inflamed small bowel. Occasional small bowel diverticulosis without diverticulitis. Normal appendix. Fluid/liquid stool in the colon. Wall thickening at the hepatic flexure adjacent to duodenal inflammation. Mixed liquid and solid stool in the more distal colon. Colonic diverticula from the descending through the sigmoid. No acute colonic diverticulitis. Vascular/Lymphatic: Prominent Peri duodenal nodes including an anterior 8 mm node series 3, image 32. There is a acute duodenal node measuring 10 mm, series 3, image 29. there is a pancreatic portal node measuring 10 mm, series 3, image 25. The portal vein is patent. Aortic atherosclerosis. No  aortic aneurysm. Reproductive: Prostate is unremarkable. Other: Inflammatory changes in the right upper quadrant with fat stranding and trace non organized free fluid which tracks in the anterior right pararenal space. No free air or abscess. Small fat containing umbilical hernia. Musculoskeletal: Multilevel degenerative disc disease and facet hypertrophy in the lumbar spine. There are no acute or suspicious osseous abnormalities. IMPRESSION: 1. Inflammatory changes adjacent to the duodenum in the right upper quadrant with fat stranding and trace non organized free fluid which tracks in the anterior right pararenal space. No evidence of perforation or abscess. Again seen duodenal wall thickening with small duodenal diverticula. There is no evidence of perforation or abscess. Differential considerations include peptic ulcer disease or duodenal diverticulitis 2. Colonic diverticulosis from the descending through the sigmoid colon. There is also a distal small bowel diverticulosis. No evidence of small bowel or colonic diverticulitis. 3. Prominent peri duodenal nodes are likely reactive, but nonspecific. Aortic Atherosclerosis (ICD10-I70.0) and Emphysema (ICD10-J43.9). Electronically Signed   By: Keith Rake M.D.   On: 03/29/2020 20:03    Procedures Procedures (including critical care time)  Medications Ordered in ED Medications  acetaminophen (TYLENOL) tablet 650 mg (650 mg Oral Given 03/29/20 1901)  iohexol (OMNIPAQUE) 300 MG/ML solution 100 mL (100 mLs Intravenous Contrast Given 03/29/20 1937)  oxyCODONE-acetaminophen (PERCOCET/ROXICET) 5-325 MG per tablet 1 tablet (1 tablet Oral Given 03/29/20 2119)    ED Course  I have reviewed the triage vital signs and the nursing notes.  Pertinent labs & imaging results that were available during my care of the patient were reviewed by me and considered in my medical decision making (see chart for details).    MDM Rules/Calculators/A&P                          74 year old male presents to ER with concern for generalized fatigue, decreased appetite.  When attempting to clarify patient's symptoms, both he and wife consistently report that he has not had any acute change in his symptoms and that these  have all been lingering since a colonoscopy was performed in early June.  He was admitted in mid June with concern for perforated duodenal ulcer, severe peptic ulcer disease.  Dr. Constance Haw had consulted on the patient at the time, he was managed conservatively with IV antibiotics, upper GI/SBFT study with no leakage, discharged home.  I reviewed the case tonight with Dr. Constance Haw who reviewed patient's CT scan.  She suspects that this is more chronic and not new acute process today.  She reports that she had recommended patient have an outpatient endoscopy to further assess likely severe peptic ulcer disease.  After reviewing case and patient's current clinical picture, she feels patient can be managed in the outpatient setting with plan for PPI and close GI follow-up.  Reviewed case in detail with patient and wife at bedside.  Repeat abdominal exam remains soft and nontender, he does not have any rebound or guarding.  No fever, no leukocytosis.  The radiologist felt this could be either peptic ulcer disease or diverticulitis today.  Suspect most likely PUD.  Patient reports no longer taking a PPI.  Will discharge patient with plan for treatment with PPI, will cover with antibiotic for possibility of infection.  Stressed need with patient and wife for close follow-up with Dr. Gala Romney their primary gastroenterologist.  Patient has appointment with primary doctor on Friday and encourage patient to keep this appointment.    After the discussed management above, the patient was determined to be safe for discharge.  The patient was in agreement with this plan and all questions regarding their care were answered.  ED return precautions were discussed and the patient will return  to the ED with any significant worsening of condition.    Final Clinical Impression(s) / ED Diagnoses Final diagnoses:  Peptic ulcer disease  Epigastric discomfort  Malaise    Rx / DC Orders ED Discharge Orders         Ordered    pantoprazole (PROTONIX) 40 MG tablet  Daily        03/29/20 2124    oxyCODONE-acetaminophen (PERCOCET) 5-325 MG tablet  Every 6 hours PRN        03/29/20 2124    amoxicillin-clavulanate (AUGMENTIN) 875-125 MG tablet  2 times daily        03/29/20 2124    dicyclomine (BENTYL) 20 MG tablet  2 times daily PRN        03/29/20 2127           Lucrezia Starch, MD 03/29/20 2136

## 2020-03-29 NOTE — Discharge Instructions (Signed)
Please call your gastroenterology office tomorrow morning to get a close follow-up appointment, ideally to be seen within the next couple days.  Please keep your follow-up appoint with your primary doctor as well.  Please take the antibiotics and the Protonix as prescribed.  You can use the pain medication prescribed as needed for breakthrough pain.  If you are having worsening pain, vomiting, fever, please return to ER for reassessment.  Note the pain medicine can make you drowsy and should not be taken treatment operating heavy machinery.  This can also cause constipation, and I would recommend taking daily MiraLAX if you are taking any of these pain pills.

## 2020-03-29 NOTE — Telephone Encounter (Signed)
Spoke with Dennis Yu niece and informed that pt has been taken to the ED in [facility]. He has decreased appetite and no output, and very fatigued.  Encouraged family to stay in the ER. Please advise.

## 2020-03-30 ENCOUNTER — Telehealth: Payer: Self-pay

## 2020-03-30 ENCOUNTER — Encounter: Payer: Self-pay | Admitting: Internal Medicine

## 2020-03-30 DIAGNOSIS — I5032 Chronic diastolic (congestive) heart failure: Secondary | ICD-10-CM | POA: Diagnosis not present

## 2020-03-30 DIAGNOSIS — N189 Chronic kidney disease, unspecified: Secondary | ICD-10-CM | POA: Diagnosis not present

## 2020-03-30 DIAGNOSIS — I10 Essential (primary) hypertension: Secondary | ICD-10-CM | POA: Diagnosis not present

## 2020-03-30 DIAGNOSIS — I130 Hypertensive heart and chronic kidney disease with heart failure and stage 1 through stage 4 chronic kidney disease, or unspecified chronic kidney disease: Secondary | ICD-10-CM | POA: Diagnosis not present

## 2020-03-30 DIAGNOSIS — E782 Mixed hyperlipidemia: Secondary | ICD-10-CM | POA: Diagnosis not present

## 2020-03-30 DIAGNOSIS — E1122 Type 2 diabetes mellitus with diabetic chronic kidney disease: Secondary | ICD-10-CM | POA: Diagnosis not present

## 2020-03-30 NOTE — Telephone Encounter (Signed)
Niece Danae Chen notified and voiced understanding.

## 2020-03-30 NOTE — Telephone Encounter (Signed)
Communication sent to my inbox after clinic hours, I found it this morning and reviewed the chart.  It looks like Dennis Yu was seen in the ER last night with suspicion of predominantly GI etiology symptoms and recommendation for him to follow-up with Dr. Gala Romney in the near future.

## 2020-03-30 NOTE — Telephone Encounter (Signed)
Pt's spouse Blanch Media called to schedule an appointment for pt. Pt was seen at the ED last night and asked to follow up with our office soon. [phone]

## 2020-03-31 DIAGNOSIS — N189 Chronic kidney disease, unspecified: Secondary | ICD-10-CM | POA: Diagnosis not present

## 2020-03-31 DIAGNOSIS — G4733 Obstructive sleep apnea (adult) (pediatric): Secondary | ICD-10-CM | POA: Diagnosis not present

## 2020-03-31 DIAGNOSIS — D638 Anemia in other chronic diseases classified elsewhere: Secondary | ICD-10-CM | POA: Diagnosis not present

## 2020-03-31 DIAGNOSIS — I10 Essential (primary) hypertension: Secondary | ICD-10-CM | POA: Diagnosis not present

## 2020-03-31 DIAGNOSIS — D509 Iron deficiency anemia, unspecified: Secondary | ICD-10-CM | POA: Diagnosis not present

## 2020-03-31 DIAGNOSIS — N1831 Chronic kidney disease, stage 3a: Secondary | ICD-10-CM | POA: Diagnosis not present

## 2020-03-31 DIAGNOSIS — E1122 Type 2 diabetes mellitus with diabetic chronic kidney disease: Secondary | ICD-10-CM | POA: Diagnosis not present

## 2020-03-31 DIAGNOSIS — I482 Chronic atrial fibrillation, unspecified: Secondary | ICD-10-CM | POA: Diagnosis not present

## 2020-03-31 DIAGNOSIS — D72829 Elevated white blood cell count, unspecified: Secondary | ICD-10-CM | POA: Diagnosis not present

## 2020-03-31 DIAGNOSIS — I5032 Chronic diastolic (congestive) heart failure: Secondary | ICD-10-CM | POA: Diagnosis not present

## 2020-03-31 DIAGNOSIS — I129 Hypertensive chronic kidney disease with stage 1 through stage 4 chronic kidney disease, or unspecified chronic kidney disease: Secondary | ICD-10-CM | POA: Diagnosis not present

## 2020-03-31 DIAGNOSIS — G9009 Other idiopathic peripheral autonomic neuropathy: Secondary | ICD-10-CM | POA: Diagnosis not present

## 2020-03-31 DIAGNOSIS — Z0001 Encounter for general adult medical examination with abnormal findings: Secondary | ICD-10-CM | POA: Diagnosis not present

## 2020-03-31 DIAGNOSIS — Z8673 Personal history of transient ischemic attack (TIA), and cerebral infarction without residual deficits: Secondary | ICD-10-CM | POA: Diagnosis not present

## 2020-03-31 DIAGNOSIS — E782 Mixed hyperlipidemia: Secondary | ICD-10-CM | POA: Diagnosis not present

## 2020-03-31 DIAGNOSIS — R0602 Shortness of breath: Secondary | ICD-10-CM | POA: Diagnosis not present

## 2020-03-31 DIAGNOSIS — I5042 Chronic combined systolic (congestive) and diastolic (congestive) heart failure: Secondary | ICD-10-CM | POA: Diagnosis not present

## 2020-04-04 ENCOUNTER — Encounter: Payer: Self-pay | Admitting: Internal Medicine

## 2020-04-05 ENCOUNTER — Telehealth: Payer: Self-pay

## 2020-04-05 ENCOUNTER — Other Ambulatory Visit: Payer: Self-pay

## 2020-04-05 ENCOUNTER — Encounter: Payer: Self-pay | Admitting: Internal Medicine

## 2020-04-05 ENCOUNTER — Encounter: Payer: Self-pay | Admitting: Nurse Practitioner

## 2020-04-05 ENCOUNTER — Ambulatory Visit (INDEPENDENT_AMBULATORY_CARE_PROVIDER_SITE_OTHER): Payer: Medicare Other | Admitting: Nurse Practitioner

## 2020-04-05 VITALS — BP 119/74 | HR 82 | Temp 97.3°F | Ht 72.0 in | Wt 195.6 lb

## 2020-04-05 DIAGNOSIS — R109 Unspecified abdominal pain: Secondary | ICD-10-CM | POA: Insufficient documentation

## 2020-04-05 DIAGNOSIS — K265 Chronic or unspecified duodenal ulcer with perforation: Secondary | ICD-10-CM | POA: Diagnosis not present

## 2020-04-05 DIAGNOSIS — I6350 Cerebral infarction due to unspecified occlusion or stenosis of unspecified cerebral artery: Secondary | ICD-10-CM

## 2020-04-05 DIAGNOSIS — D509 Iron deficiency anemia, unspecified: Secondary | ICD-10-CM

## 2020-04-05 DIAGNOSIS — R1010 Upper abdominal pain, unspecified: Secondary | ICD-10-CM

## 2020-04-05 MED ORDER — PANTOPRAZOLE SODIUM 40 MG PO TBEC: 40.0000 mg | DELAYED_RELEASE_TABLET | Freq: Two times a day (BID) | ORAL | 1 refills | Status: DC

## 2020-04-05 NOTE — Telephone Encounter (Signed)
Dr. Domenic Polite, pt saw Walden Field, PA in office today 04/05/20 and is due for a colonoscopy and upper endoscopy. Walden Field, PA would like clearance for pt to have procedures. Is it ok for pt to hold Eliquis 3 days prior to procedures? Is Lovenox bridge needed? Please advise. Thank you.

## 2020-04-05 NOTE — Telephone Encounter (Signed)
I will forward to our anticoagulation clinic for protocol recommendations related to Lovenox bridge since Eliquis requested to be held for 3 days prior to procedure.  From a cardiac perspective he should otherwise be able to proceed with EGD and colonoscopy at an acceptable risk.

## 2020-04-05 NOTE — Progress Notes (Signed)
Referring Provider: [name], MD Primary Care Physician:  [name], MD Primary GI:  Dr. Gala Romney  Chief Complaint  Patient presents with  . Hospitalization Follow-up    ok now    HPI:   Dennis Yu is a 74 y.o. male who presents for hospital follow-up.  The patient was last seen in our office 11/17/2019 for IDA, diarrhea, history of colon polyps.  Previous visit was referral from hematology/oncology due to colonoscopy recommendation.  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  At his last visit noted he just started oral iron and notes diarrhea almost every day with about 3-4 stools a day.  No hematochezia or melena, although stools do become dark when he started iron.  No recent sick contacts, travel.  Not on Metformin.  Diarrhea ongoing for 6 months and no previous stool studies.  Is diabetic but does not check his CBGs at home, although he feels he is doing well based on PCP visit results.  Noted history of ischemic cardiomyopathy last evaluated by cardiology at the end of 2020 and Coumadin dosing followed by anticoagulation clinic.  LVEF approximately 25% and unable to stay on Entresto due to cost so he is now on Benicar and Aldactone.  Also on Coreg and Lasix.  Recommended stool studies, Imodium as needed, colonoscopy, follow-up with hematology, follow-up in our office in 4 months.  Colonoscopy was completed 01/11/2020 which found diverticular disease, a single 5 mm polyp in the ascending colon, otherwise normal.  Surgical pathology found the polyp to be sessile serrated polyp without cytologic dysplasia.  Colon biopsies found no significant pathological findings.  Recommended repeat colonoscopy in 5 years only if overall health permits (0518).  Unfortunately the patient was admitted to the hospital from 01/13/2020 to 01/21/2020.  Noted significant ischemic cardiomyopathy with LVEF 25%.  He began having  epigastric abdominal pain that began a few days prior.  He notes that he did not mention his epigastric abdominal pain to anyone at the time prior to work-up for colonoscopy or during his colonoscopy.  Leading up to his ER presentation he was to avoid exacerbating his pain.  He was admitted for likely perforated duodenal ulcer and maintained on TPN for several days as well as Zosyn and fluconazole.  He had an upper GI/SBFT study with no leakage and diet was advanced.  He was discharged in satisfactory condition.  Recommended continue PPI twice daily until seen by GI, recommend EGD in 6 to 8 weeks, complete antibiotics and antifungal, full liquid diet for 2 more weeks and then advance to soft as tolerated.  Patient again presented to the emergency department 03/29/2020 for a myriad of symptoms with noted generalized weakness, fatigue, poor appetite.  Some upper abdominal discomfort but no further frank pain.  Increased constipation but does have a bowel movement whenever he takes a laxative.  Thankfully hemoglobin was stable at 11.0 at that time.  CT scans noted inflammatory changes adjacent to the duodenum in the right upper quadrant with fat stranding and trace nonorganized free fluid without evidence of perforation or abscess, again seen duodenal wall thickening with small duodenal diverticula without evidence of perforation or abscess.  Differentials include peptic ulcer disease or duodenal diverticulitis.  Also noted prominent periduodenal nodes likely reactive and colonic diverticulosis.  The ER physician reviewed CT scan with Dr. Constance Haw from surgery who felt symptoms were more likely chronic rather  than acute process.  Recommended outpatient colonoscopy as previously recommended.  It was noted at that time the patient reported he was no longer taking his PPI as recommended.  He was discharged with recommendations of resume PPI and follow-up with GI.  Today he states he is doing okay overall. He has had  resolution of his abdominal pain for now. Pain last week was rough, but is doing better now. Hasn't taken Carafate; it was sent to [facility] on [facility] rather than his regular pharmacy at [employer] [facility]; plans to pick up Carafate after his appointment. His abdominal pain began when he started bowel prep prior to colonoscopy. He was having dark stools previously, not currently on iron. Black stools have resolved. Denies N/V, hematochezia, fever, chills. Admits unintentional weight loss of about 15 lb since last OV (subjective; objectively down about 6 lbs in 4 months). His appetite is good now. Denies URI or flu-like symptoms. Denies loss of sense of taste or smell. The patient has received COVID-19 vaccination(s). States he has had dyspnea since coloscopy, stable at this time. Denies chest pain, dizziness, lightheadedness, syncope, near syncope. Denies any other upper or lower GI symptoms.  Has CHF, no worsening of fluid retention (sees Cardiology who recently increased his diuretic, per the patient).  Past Medical History:  Diagnosis Date  . Bronchitis 11/2012  . Cardiomyopathy    LVEF 25%  . Diabetes mellitus   . Erectile dysfunction   . Essential hypertension   . GERD (gastroesophageal reflux disease)   . History of colonic polyps   . Mixed hyperlipidemia   . 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 Obstructive sleep apnea   . Patent foramen ovale   . Phlebitis   . 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 Superficial phlebitis 12/10/2012  . Type 2 diabetes mellitus ([place])   . Vitamin D deficiency     Past Surgical History:  Procedure Laterality Date  . BIOPSY  01/11/2020   Procedure: BIOPSY;  Surgeon: Daneil Dolin, MD;  Location: [facility];  Service: Endoscopy;;  . COLONOSCOPY WITH PROPOFOL N/A 01/11/2020   Procedure: COLONOSCOPY WITH PROPOFOL;  Surgeon: Daneil Dolin, MD;  Location: [facility];  Service: Endoscopy;  Laterality: N/A;  10:15am  . NO PAST SURGERIES    . POLYPECTOMY   01/11/2020   Procedure: POLYPECTOMY;  Surgeon: Daneil Dolin, MD;  Location: [facility];  Service: Endoscopy;;    Current Outpatient Medications  Medication Sig Dispense Refill  . aspirin 81 MG tablet Take 81 mg by mouth daily.      [name] dicyclomine (BENTYL) 20 MG tablet Take 1 tablet (20 mg total) by mouth 2 (two) times daily as needed for spasms. 10 tablet 0  . furosemide (LASIX) 40 MG tablet Take 1.5 tablets (60 mg total) by mouth daily. 135 tablet 3  . gabapentin (NEURONTIN) 300 MG capsule Take 3 capsules (900 mg total) by mouth daily. (Patient taking differently: Take 300-600 mg by mouth See admin instructions. 300 mg during the day, 600 mg at bedtime) 90 capsule 11  . glipiZIDE (GLUCOTROL) 5 MG tablet Take 5 mg by mouth daily.     . NON FORMULARY legatrin pm Daily      . Omega-3 Fatty Acids (FISH OIL) 1000 MG CAPS Take 1,000 mg by mouth daily.     . pantoprazole (PROTONIX) 40 MG tablet Take 1 tablet (40 mg total) by mouth daily. (Patient taking differently: Take  40 mg by mouth 2 (two) times daily before a meal. ) 30 tablet 0  . PROAIR HFA 108 (90 BASE) MCG/ACT inhaler Inhale 2 puffs into the lungs every 4 (four) hours as needed for wheezing or shortness of breath.     . simvastatin (ZOCOR) 40 MG tablet Take 40 mg by mouth daily at 6 PM.     . spironolactone (ALDACTONE) 25 MG tablet Take 0.5 tablets (12.5 mg total) by mouth daily. 45 tablet 3  . warfarin (JANTOVEN) 5 MG tablet TAKE 1 TABLET DAILY EXCEPT 2 TABLETS ON  FRIDAYS 100 tablet 3  . pantoprazole (PROTONIX) 40 MG tablet Take 1 tablet (40 mg total) by mouth 2 (two) times daily before a meal. 180 tablet 1   No current facility-administered medications for this visit.    Allergies as of 04/05/2020  . (No Known Allergies)    Family History  Problem Relation Age of Onset  . Diabetes Father   . Hypertension Other   . Colon cancer Neg Hx     Social History   Socioeconomic History  . Marital status: Married    Spouse name:  Not on file  . Number of children: Not on file  . Years of education: Not on file  . Highest education level: Not on file  Occupational History  . Occupation: Truck Education administrator: RETIRED  Tobacco Use  . Smoking status: Former Smoker    Types: Cigarettes    Start date: 08/06/1967    Quit date: 08/06/2003    Years since quitting: 16.6  . Smokeless tobacco: Never Used  Vaping Use  . Vaping Use: Never used  Substance and Sexual Activity  . Alcohol use: No    Alcohol/week: 0.0 standard drinks  . Drug use: No  . Sexual activity: Not Currently  Other Topics Concern  . Not on file  Social History Narrative  . Not on file   Social Determinants of Health   Financial Resource Strain:   . Difficulty of Paying Living Expenses: Not on file  Food Insecurity:   . Worried About Charity fundraiser in the Last Year: Not on file  . Ran Out of Food in the Last Year: Not on file  Transportation Needs:   . Lack of Transportation (Medical): Not on file  . Lack of Transportation (Non-Medical): Not on file  Physical Activity:   . Days of Exercise per Week: Not on file  . Minutes of Exercise per Session: Not on file  Stress:   . Feeling of Stress : Not on file  Social Connections:   . Frequency of Communication with Friends and Family: Not on file  . Frequency of Social Gatherings with Friends and Family: Not on file  . Attends Religious Services: Not on file  . Active Member of Clubs or Organizations: Not on file  . Attends Archivist Meetings: Not on file  . Marital Status: Not on file    Subjective: Review of Systems  Constitutional: Negative for chills, fever, malaise/fatigue and weight loss.  HENT: Negative for congestion and sore throat.   Respiratory: Negative for cough and shortness of breath.   Cardiovascular: Negative for chest pain and palpitations.  Gastrointestinal: Negative for abdominal pain, blood in stool, diarrhea, melena, nausea and vomiting.    Musculoskeletal: Negative for joint pain and myalgias.  Skin: Negative for rash.  Neurological: Negative for dizziness and weakness.  Endo/Heme/Allergies: Does not bruise/bleed easily.  Psychiatric/Behavioral: Negative for depression. The  patient is not nervous/anxious.   All other systems reviewed and are negative.    Objective: BP 119/74   Pulse 82   Temp (!) 97.3 F (36.3 C) (Temporal)   Ht 6' (1.829 m)   Wt 195 lb 9.6 oz (88.7 kg)   BMI 26.53 kg/m  Physical Exam Vitals and nursing note reviewed.  Constitutional:      General: He is not in acute distress.    Appearance: Normal appearance. He is not ill-appearing, toxic-appearing or diaphoretic.  HENT:     Head: Normocephalic and atraumatic.     Nose: No congestion or rhinorrhea.  Eyes:     General: No scleral icterus. Cardiovascular:     Rate and Rhythm: Normal rate and regular rhythm.     Heart sounds: Normal heart sounds.  Pulmonary:     Effort: Pulmonary effort is normal.     Breath sounds: Normal breath sounds.  Abdominal:     General: Bowel sounds are normal. There is no distension.     Palpations: Abdomen is soft. There is no hepatomegaly, splenomegaly or mass.     Tenderness: There is no abdominal tenderness. There is no guarding or rebound.     Hernia: No hernia is present.  Musculoskeletal:     Cervical back: Neck supple.  Skin:    General: Skin is warm and dry.     Coloration: Skin is not jaundiced.     Findings: No bruising or rash.  Neurological:     General: No focal deficit present.     Mental Status: He is alert and oriented to person, place, and time. Mental status is at baseline.  Psychiatric:        Mood and Affect: Mood normal.        Behavior: Behavior normal.        Thought Content: Thought content normal.      Assessment:  Very pleasant 74 year old male who was admitted to the hospital for about a week with a perforated duodenal ulcer treated conservatively with antibiotics.  He was  on TPN for several days.  Upon discharge he was recommended to take PPI.  However, based on ER notes at [facility] it indicates that he was not taking his PPI as recommended and subsequently having more epigastric pain.  I recommended he do take his PPI twice a day (reinforced by primary care) and has since had, essentially, resolution of his abdominal pain.  He still does not feel great overall.  Perforated ulcer: We will need to plan for endoscopy to further evaluate.  His abdominal imaging is abnormal and likely consistent with duodenal ulcer.  He is on PPI twice daily.  No further abdominal pain.  I recommended he call us for any worsening symptoms  IDA: I reviewed labs drawn by primary care which is stable hemoglobin.  Likely multifactorial in nature as per hematology/oncology notes.  Unclear how much overall his duodenal ulcer was playing as he was not generally symptomatic prior to his hospitalization, other than a few days before.  Proceed with EGD on propofol/MAC with Dr. Gala Romney in near future: the risks, benefits, and alternatives have been discussed with the patient in detail. The patient states understanding and desires to proceed.  The patient is currently on Neurontin, glipizide, warfarin.  The patient is not on any other anticoagulants, anxiolytics, chronic pain medications, antidepressants, antidiabetics, or iron supplements. We will clear for warfarin hold with cardiology.  We will make adjustments to diabetic meds.  We  will plan for the procedure on propofol/MAC to promote adequate sedation and medical monitoring.  ASA III/IV   Plan: 1. Continue PPI 2. Fill Carafate and take this as recommendedF 3. We will plan for tentative EGD.  Will need to clear with cardiology due to EF.  We will also need to clear Dr. Gala Romney due to EF.  Will need to get cardiology okay to hold warfarin for 3 days prior and question if need for Lovenox bridge 4. Call for any worsening symptoms 5. Follow-up in 3  months   Thank you for allowing [place] to participate in the care of [place], DNP, AGNP-C Adult & Gerontological Nurse Practitioner [facility]   04/05/2020 10:41 AM   Disclaimer: This note was dictated with voice recognition software. Similar sounding words can inadvertently be transcribed and may not be corrected upon review.

## 2020-04-05 NOTE — Patient Instructions (Addendum)
Your health issues we discussed today were:   Previous ulcer with perforation and abdominal pain: 1. I am glad you are feeling better! 2. Continue taking your acid blocker Protonix (pantoprazole) twice a day.  I sent a refill to your mail order pharmacy for you 3. We will work on scheduling your upper endoscopy to evaluate the area that had the ulcer 4. We will need to reach out to your cardiologist to clear you for the procedure 5. We will call to schedule and make any recommendations based on our discussion with your cardiologist 6. Call us if you have any worsening or severe symptoms  Overall I recommend:  1. Continue your other current medications 2. Return for follow-up in 3 months 3. Call us if you have any questions or concerns   ---------------------------------------------------------------  I am glad you have gotten your COVID-19 vaccination!  Even though you are fully vaccinated you should continue to follow CDC and state/local guidelines.  ---------------------------------------------------------------   At [facility] we value your feedback. You may receive a survey about your visit today. Please share your experience as we strive to create trusting relationships with our patients to provide genuine, compassionate, quality care.  We appreciate your understanding and patience as we review any laboratory studies, imaging, and other diagnostic tests that are ordered as we care for you. Our office policy is 5 business days for review of these results, and any emergent or urgent results are addressed in a timely manner for your best interest. If you do not hear from our office in 1 week, please contact us.   We also encourage the use of MyChart, which contains your medical information for your review as well. If you are not enrolled in this feature, an access code is on this after visit summary for your convenience. Thank you for allowing [place] to be involved in your  care.  It was great to see you today!  I hope you have a great rest of your summer!!

## 2020-04-05 NOTE — Telephone Encounter (Signed)
Noted. Routing to [facility]

## 2020-04-06 NOTE — Telephone Encounter (Signed)
Pt will be called to schedule procedure when November schedule if available.

## 2020-04-07 NOTE — Telephone Encounter (Signed)
Noted  

## 2020-04-11 ENCOUNTER — Encounter: Payer: Self-pay | Admitting: Cardiology

## 2020-04-11 ENCOUNTER — Other Ambulatory Visit: Payer: Self-pay

## 2020-04-11 ENCOUNTER — Ambulatory Visit (INDEPENDENT_AMBULATORY_CARE_PROVIDER_SITE_OTHER): Payer: Medicare Other | Admitting: Cardiology

## 2020-04-11 VITALS — BP 120/80 | HR 78 | Ht 72.0 in | Wt 195.4 lb

## 2020-04-11 DIAGNOSIS — N1832 Chronic kidney disease, stage 3b: Secondary | ICD-10-CM

## 2020-04-11 DIAGNOSIS — I255 Ischemic cardiomyopathy: Secondary | ICD-10-CM | POA: Diagnosis not present

## 2020-04-11 DIAGNOSIS — I6350 Cerebral infarction due to unspecified occlusion or stenosis of unspecified cerebral artery: Secondary | ICD-10-CM

## 2020-04-11 DIAGNOSIS — Z8673 Personal history of transient ischemic attack (TIA), and cerebral infarction without residual deficits: Secondary | ICD-10-CM

## 2020-04-11 MED ORDER — ENTRESTO 24-26 MG PO TABS: 1.0000 | ORAL_TABLET | Freq: Two times a day (BID) | ORAL | 11 refills | Status: DC

## 2020-04-11 NOTE — Telephone Encounter (Signed)
LMOVM for pt 

## 2020-04-11 NOTE — Patient Instructions (Addendum)
Medication Instructions:  Your physician recommends that you continue on your current medications as directed. Please refer to the Current Medication list given to you today.  Please fill out and return patient assistance form for Entresto   *If you need a refill on your cardiac medications before your next appointment, please call your pharmacy*   Lab Work: Your physician recommends that you return for lab work 2 weeks 04/27/20  If you have labs (blood work) drawn today and your tests are completely normal, you will receive your results only by: [name] MyChart Message (if you have MyChart) OR . A paper copy in the mail If you have any lab test that is abnormal or we need to change your treatment, we will call you to review the results.   Testing/Procedures: NONE     Follow-Up: At [facility], you and your health needs are our priority.  As part of our continuing mission to provide you with exceptional heart care, we have created designated Provider Care Teams.  These Care Teams include your primary Cardiologist (physician) and Advanced Practice Providers (APPs -  Physician Assistants and Nurse Practitioners) who all work together to provide you with the care you need, when you need it.  We recommend signing up for the patient portal called "MyChart".  Sign up information is provided on this After Visit Summary.  MyChart is used to connect with patients for Virtual Visits (Telemedicine).  Patients are able to view lab/test results, encounter notes, upcoming appointments, etc.  Non-urgent messages can be sent to your provider as well.   To learn more about what you can do with MyChart, go to [email or web address].    Your next appointment:   4-6 week(s)  The format for your next appointment:   In Person  Provider:   Bernerd Pho, PA-C   Other Instructions Thank you for choosing [facility]!

## 2020-04-11 NOTE — Progress Notes (Signed)
Cardiology Office Note  Date: 04/11/2020   ID: LOT [name], DOB 07/04/1946, MRN [id number]  PCP:  [name], MD  Cardiologist:  Rozann Lesches, MD Electrophysiologist:  None   Chief Complaint  Patient presents with  . Cardiac follow-up    History of Present Illness: Dennis Yu is a 74 y.o. male last seen in August.  He presents for a follow-up visit.  I reviewed the interval chart, also recent follow-up with gastroenterology and plan for further endoscopic evaluation.  From a cardiac perspective, he states that he has been doing reasonably well, reports NYHA class II dyspnea at present and no active angina symptoms.  Weight has been stable in the last few weeks.  He does not report any orthopnea or PND.  He is on Coumadin with follow-up in the anticoagulation clinic.  He does not report any spontaneous bleeding problems.  Recent lab work in late August revealed creatinine 1.34 down from 1.52, potassium normal at 4.4.  I went over his medications, our list was not complete.  In addition to Aldactone, Lasix, and Zocor, he states that he is also been taking Coreg as we reviewed at last office encounter.  We are clarifying with pharmacy.  At this point will also try to initiate low-dose Entresto.  Past Medical History:  Diagnosis Date  . Bronchitis 11/2012  . Cardiomyopathy    LVEF 25%  . Erectile dysfunction   . Essential hypertension   . GERD (gastroesophageal reflux disease)   . History of colonic polyps   . Mixed hyperlipidemia   . 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 Obstructive sleep apnea   . Patent foramen ovale   . Phlebitis   . 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 Superficial phlebitis 12/10/2012  . Type 2 diabetes mellitus (Dennis Yu)   . Vitamin D deficiency     Past Surgical History:  Procedure Laterality Date  . BIOPSY  01/11/2020   Procedure: BIOPSY;  Surgeon: Daneil Dolin, MD;  [facility];  Service: Endoscopy;;  .  COLONOSCOPY WITH PROPOFOL N/A 01/11/2020   Procedure: COLONOSCOPY WITH PROPOFOL;  Surgeon: Daneil Dolin, MD;  [facility];  Service: Endoscopy;  Laterality: N/A;  10:15am  . NO PAST SURGERIES    . POLYPECTOMY  01/11/2020   Procedure: POLYPECTOMY;  Surgeon: Daneil Dolin, MD;  [facility];  Service: Endoscopy;;    Current Outpatient Medications  Medication Sig Dispense Refill  . aspirin 81 MG tablet Take 81 mg by mouth daily.      [name] dicyclomine (BENTYL) 20 MG tablet Take 1 tablet (20 mg total) by mouth 2 (two) times daily as needed for spasms. 10 tablet 0  . furosemide (LASIX) 40 MG tablet Take 1.5 tablets (60 mg total) by mouth daily. 135 tablet 3  . gabapentin (NEURONTIN) 300 MG capsule Take 3 capsules (900 mg total) by mouth daily. (Patient taking differently: Take 300-600 mg by mouth See admin instructions. 300 mg during the day, 600 mg at bedtime) 90 capsule 11  . glipiZIDE (GLUCOTROL) 5 MG tablet Take 5 mg by mouth daily.     . NON FORMULARY legatrin pm Daily      . Omega-3 Fatty Acids (FISH OIL) 1000 MG CAPS Take 1,000 mg by mouth daily.     . pantoprazole (PROTONIX) 40 MG tablet Take 1 tablet (40 mg total) by mouth daily. (Patient taking differently: Take  40 mg by mouth 2 (two) times daily before a meal. ) 30 tablet 0  . pantoprazole (PROTONIX) 40 MG tablet Take 1 tablet (40 mg total) by mouth 2 (two) times daily before a meal. 180 tablet 1  . PROAIR HFA 108 (90 BASE) MCG/ACT inhaler Inhale 2 puffs into the lungs every 4 (four) hours as needed for wheezing or shortness of breath.     . simvastatin (ZOCOR) 40 MG tablet Take 40 mg by mouth daily at 6 PM.     . spironolactone (ALDACTONE) 25 MG tablet Take 0.5 tablets (12.5 mg total) by mouth daily. 45 tablet 3  . warfarin (JANTOVEN) 5 MG tablet TAKE 1 TABLET DAILY EXCEPT 2 TABLETS ON  FRIDAYS 100 tablet 3  . sacubitril-valsartan (ENTRESTO) 24-26 MG Take 1 tablet by mouth 2 (two) times daily. 60 tablet 11   No  current facility-administered medications for this visit.   Allergies:  Patient has no known allergies.   ROS:  No palpitations or syncope.  Physical Exam: VS:  BP 120/80   Pulse 78   Ht 6' (1.829 m)   Wt 195 lb 6.4 oz (88.6 kg)   SpO2 97%   BMI 26.50 kg/m , BMI Body mass index is 26.5 kg/m.  Wt Readings from Last 3 Encounters:  04/11/20 195 lb 6.4 oz (88.6 kg)  04/05/20 195 lb 9.6 oz (88.7 kg)  03/24/20 202 lb (91.6 kg)    General: Patient appears comfortable at rest. HEENT: Conjunctiva and lids normal, wearing a mask. Neck: Supple, no elevated JVP or carotid bruits, no thyromegaly. Lungs: Clear to auscultation, nonlabored breathing at rest. Cardiac: Indistinct PMI, regular rate and rhythm, no S3, soft systolic murmur, no pericardial rub. Extremities: No pitting edema.  ECG:  An ECG dated 03/29/2020 was personally reviewed today and demonstrated:  Sinus rhythm with old inferior infarct pattern, fusion beat.  Recent Labwork: 01/20/2020: Magnesium 2.0 03/29/2020: ALT 24; AST 20; BUN 20; Creatinine, Ser 1.34; Hemoglobin 11.0; Platelets 408; Potassium 4.4; Sodium 136     Component Value Date/Time   CHOL 145 04/05/2011 0545   TRIG 143 01/17/2020 0600   HDL 40 04/05/2011 0545   CHOLHDL 3.6 04/05/2011 0545   VLDL 13 04/05/2011 0545   LDLCALC 92 04/05/2011 0545    Other Studies Reviewed Today:  Echocardiogram 02/23/2020: 1. Left ventricular ejection fraction, by estimation, is approximately  25%. The left ventricle has severely decreased function. The left  ventricle demonstrates regional wall motion abnormalities (see scoring  diagram/findings for description). The left  ventricular internal cavity size was mildly dilated. Left ventricular  diastolic parameters are indeterminate.  2. Calcified apical LV mural thrombus present. Also prominent apical  trabeculation.  3. Right ventricular systolic function is normal. The right ventricular  size is normal. Tricuspid  regurgitation signal is inadequate for assessing  PA pressure.  4. Left atrial size was mildly dilated.  5. The mitral valve is abnormal, mild to moderate annular calcification  and restricted posterior leaflet motion. Moderate mitral valve  regurgitation.  6. The aortic valve is tricuspid, moderate annular calcification. Aortic  valve regurgitation is not visualized.  7. The inferior vena cava is normal in size with greater than 50%  respiratory variability, suggesting right atrial pressure of 3 mmHg.   Assessment and Plan:  1.  Ischemic cardiomyopathy with LVEF approximately 25%, stable over time and with normal RV contraction.  He has preferred conservative management.  Continue medical therapy including Coreg, Aldactone, Lasix, and Zocor.  We  will check on coverage for Entresto and initiate 24/26 mg twice daily.  If he tolerates this check BMET for next office visit in the next 4 weeks.  2.  History of prior stroke, calcified apical LV thrombus.  He is on Coumadin, we have continued low-dose aspirin as well which he has tolerated.  3.  CKD stage IIIb, recent creatinine relatively stable, 1.34 with normal potassium.  Medication Adjustments/Labs and Tests Ordered: Current medicines are reviewed at length with the patient today.  Concerns regarding medicines are outlined above.   Tests Ordered: Orders Placed This Encounter  Procedures  . Basic Metabolic Panel (BMET)    Medication Changes: Meds ordered this encounter  Medications  . sacubitril-valsartan (ENTRESTO) 24-26 MG    Sig: Take 1 tablet by mouth 2 (two) times daily.    Dispense:  60 tablet    Refill:  11    Disposition:  Follow up 4 weeks with [place] in the [place] office.  Signed, Satira Sark, MD, [place] [facility] 04/11/2020 2:43 PM    [place] [facility] [address] [place], [place] [address] Phone: [phone]; Fax: [phone]

## 2020-04-12 NOTE — Telephone Encounter (Signed)
Patient returned call. He is scheduled for EGD with Dr. Gala Romney with propofol on 11/22 at 11:15am. Randall Hiss please advise regarding patient holding coumadin and ? If he needs lovenox bridge. Messages not clear on what is needed

## 2020-04-14 ENCOUNTER — Telehealth: Payer: Self-pay

## 2020-04-14 NOTE — Telephone Encounter (Signed)
Dennis Oh, RN, pt was seen by Walden Field, PA on 04/05/20 and is due for a colonoscopy and upper endoscopy. Walden Field, PA would like clearance for pt to have procedures. Is it ok for pt to hold Warfarin 3 days prior to procedures? Is Lovenox bridge needed? Please advise. Thank you.

## 2020-04-14 NOTE — Telephone Encounter (Signed)
Noted. I haven't heard back. It looks like Dr. Domenic Polite routed message to the nurse and no response was given. I will send a new message to the nurse.

## 2020-04-14 NOTE — Telephone Encounter (Signed)
We should err on the side of caution and use Lovenox bridge in his case as done previously.

## 2020-04-14 NOTE — Telephone Encounter (Signed)
Routing to Eric Gill, PA 

## 2020-04-14 NOTE — Telephone Encounter (Signed)
Patient with diagnosis of prior stroke, calcified apical LV thrombus on warfarin for anticoagulation.    Procedure: colonoscopy/upper endoscopy Date of procedure: 06/26/20  Pt takes warfarin for history of stroke in 2012 (also has history of PFO). He did have a DVT > 10 years ago but did not continue on long term warfarin for this. Also has a history of LV mural thrombus that has since resolved. No history of afib. Long term warfarin was continued for history of stroke and PFO.  Back in June patient held warfarin for 5 days prior to procedure and Dr. Domenic Polite recommended a lovenox bridge.  MD is requesting a 3 day hold this time. I will again defer to Dr. Domenic Polite as to if he would like patient bridged with lovenox.

## 2020-04-14 NOTE — Telephone Encounter (Signed)
Dennis Yu, did we ever hear back from the coumadin clinic on holding Coumadin (and if need for Lovenox Bridge?) FYI the original phone note to Dr. Domenic Polite mentions Eliquis, but he's on Warfarin

## 2020-04-14 NOTE — Telephone Encounter (Signed)
Dennis Yu, pt will need lovenox bridge for procedure. Procedure is not until Nov

## 2020-04-17 NOTE — Telephone Encounter (Signed)
So it sounds like CV will handle the Lovenox bridge.

## 2020-04-18 NOTE — Telephone Encounter (Signed)
Noted. I have mailed instructions with pre-op/covid test appt.

## 2020-04-19 ENCOUNTER — Ambulatory Visit (INDEPENDENT_AMBULATORY_CARE_PROVIDER_SITE_OTHER): Payer: Medicare Other | Admitting: *Deleted

## 2020-04-19 ENCOUNTER — Other Ambulatory Visit: Payer: Self-pay

## 2020-04-19 DIAGNOSIS — I6350 Cerebral infarction due to unspecified occlusion or stenosis of unspecified cerebral artery: Secondary | ICD-10-CM | POA: Diagnosis not present

## 2020-04-19 DIAGNOSIS — I639 Cerebral infarction, unspecified: Secondary | ICD-10-CM

## 2020-04-19 DIAGNOSIS — Z86718 Personal history of other venous thrombosis and embolism: Secondary | ICD-10-CM | POA: Diagnosis not present

## 2020-04-19 DIAGNOSIS — Z5181 Encounter for therapeutic drug level monitoring: Secondary | ICD-10-CM | POA: Diagnosis not present

## 2020-04-19 DIAGNOSIS — Z7901 Long term (current) use of anticoagulants: Secondary | ICD-10-CM | POA: Diagnosis not present

## 2020-04-19 LAB — POCT INR: INR: 3.4 — AB (ref 2.0–3.0)

## 2020-04-19 NOTE — Patient Instructions (Signed)
Hold warfarin tonight then decrease dose to 1 tablet daily  Recheck in 4 weeks Needs Lovenox instructions for procedure 9/22

## 2020-04-20 DIAGNOSIS — E1122 Type 2 diabetes mellitus with diabetic chronic kidney disease: Secondary | ICD-10-CM | POA: Diagnosis not present

## 2020-04-20 DIAGNOSIS — N183 Chronic kidney disease, stage 3 unspecified: Secondary | ICD-10-CM | POA: Diagnosis not present

## 2020-04-20 DIAGNOSIS — E7849 Other hyperlipidemia: Secondary | ICD-10-CM | POA: Diagnosis not present

## 2020-04-20 DIAGNOSIS — I129 Hypertensive chronic kidney disease with stage 1 through stage 4 chronic kidney disease, or unspecified chronic kidney disease: Secondary | ICD-10-CM | POA: Diagnosis not present

## 2020-04-21 DIAGNOSIS — E1142 Type 2 diabetes mellitus with diabetic polyneuropathy: Secondary | ICD-10-CM | POA: Diagnosis not present

## 2020-04-21 DIAGNOSIS — B351 Tinea unguium: Secondary | ICD-10-CM | POA: Diagnosis not present

## 2020-05-17 ENCOUNTER — Ambulatory Visit (INDEPENDENT_AMBULATORY_CARE_PROVIDER_SITE_OTHER): Payer: Medicare Other | Admitting: *Deleted

## 2020-05-17 DIAGNOSIS — Z7901 Long term (current) use of anticoagulants: Secondary | ICD-10-CM | POA: Diagnosis not present

## 2020-05-17 DIAGNOSIS — Z86718 Personal history of other venous thrombosis and embolism: Secondary | ICD-10-CM | POA: Diagnosis not present

## 2020-05-17 DIAGNOSIS — I639 Cerebral infarction, unspecified: Secondary | ICD-10-CM

## 2020-05-17 DIAGNOSIS — Z5181 Encounter for therapeutic drug level monitoring: Secondary | ICD-10-CM | POA: Diagnosis not present

## 2020-05-17 DIAGNOSIS — I6350 Cerebral infarction due to unspecified occlusion or stenosis of unspecified cerebral artery: Secondary | ICD-10-CM

## 2020-05-17 LAB — POCT INR: INR: 2.6 (ref 2.0–3.0)

## 2020-05-17 NOTE — Patient Instructions (Signed)
Continue warfarin 1 tablet daily  Recheck in 4 weeks Pt cancelling EGD scheduled for November

## 2020-05-23 ENCOUNTER — Ambulatory Visit: Payer: Medicare Other | Admitting: Nurse Practitioner

## 2020-05-23 ENCOUNTER — Ambulatory Visit: Payer: Medicare Other | Admitting: Internal Medicine

## 2020-05-24 ENCOUNTER — Telehealth: Payer: Self-pay | Admitting: Internal Medicine

## 2020-05-24 ENCOUNTER — Ambulatory Visit: Payer: Medicare Other | Admitting: Student

## 2020-05-24 NOTE — Telephone Encounter (Signed)
Called endo and advised to cancel procedure. FYI to [employer]

## 2020-05-24 NOTE — Telephone Encounter (Signed)
Patient came in office and said that he did not want to have his procedure done

## 2020-05-25 NOTE — Progress Notes (Signed)
Cardiology Office Note  Date: 05/26/2020   ID: Karanveer, Ramakrishnan [birth year], MRN [id number]  PCP:  [name], MD  Cardiologist:  Rozann Lesches, MD Electrophysiologist:  None   Chief Complaint  Patient presents with  . Cardiac follow-up    History of Present Illness: Dennis Yu is a 74 y.o. male last seen in August.  He presents for a follow-up visit.  He has not yet started on the free trial of Entresto, turned in paperwork for the assistance program today and I am hopeful that this will go through.  He also sees his nephrologist next week with repeat lab work, last creatinine was 1.34 with normal potassium in August.  His weight is up 5 pounds, but he reports no significant leg swelling at this time or increasing shortness of breath on current dose of Lasix.  Also no angina symptoms.  He remains on Coumadin with follow-up in the anticoagulation clinic.  Recent INR 2.6.  No spontaneous bleeding problems reported.  I reviewed the remainder of his medications which are outlined below.  Past Medical History:  Diagnosis Date  . Bronchitis 11/2012  . Cardiomyopathy    LVEF 25%  . Erectile dysfunction   . Essential hypertension   . GERD (gastroesophageal reflux disease)   . History of colonic polyps   . Mixed hyperlipidemia   . 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 Obstructive sleep apnea   . Patent foramen ovale   . Phlebitis   . Stroke [facility])    Embolic left temporal 3/26 s/p tPA  . Superficial phlebitis 12/10/2012  . Type 2 diabetes mellitus ([place])   . Vitamin D deficiency     Past Surgical History:  Procedure Laterality Date  . BIOPSY  01/11/2020   Procedure: BIOPSY;  Surgeon: Daneil Dolin, MD;  [facility];  Service: Endoscopy;;  . COLONOSCOPY WITH PROPOFOL N/A 01/11/2020   Procedure: COLONOSCOPY WITH PROPOFOL;  Surgeon: Daneil Dolin, MD;  [facility];  Service: Endoscopy;  Laterality: N/A;  10:15am  . NO PAST SURGERIES    .  POLYPECTOMY  01/11/2020   Procedure: POLYPECTOMY;  Surgeon: Daneil Dolin, MD;  [facility];  Service: Endoscopy;;    Current Outpatient Medications  Medication Sig Dispense Refill  . aspirin 81 MG tablet Take 81 mg by mouth daily.      . carvedilol (COREG) 6.25 MG tablet Take 6.25 mg by mouth 2 (two) times daily with a meal.    . dicyclomine (BENTYL) 20 MG tablet Take 1 tablet (20 mg total) by mouth 2 (two) times daily as needed for spasms. 10 tablet 0  . furosemide (LASIX) 40 MG tablet Take 1.5 tablets (60 mg total) by mouth daily. 135 tablet 3  . gabapentin (NEURONTIN) 300 MG capsule Take 3 capsules (900 mg total) by mouth daily. (Patient taking differently: Take 300-600 mg by mouth See admin instructions. 300 mg during the day, 600 mg at bedtime) 90 capsule 11  . glipiZIDE (GLUCOTROL) 5 MG tablet Take 5 mg by mouth daily.     . NON FORMULARY legatrin pm Daily      . Omega-3 Fatty Acids (FISH OIL) 1000 MG CAPS Take 1,000 mg by mouth daily.     . pantoprazole (PROTONIX) 40 MG tablet Take 1 tablet (40 mg total) by mouth daily. (Patient taking differently: Take 40 mg by mouth 2 (two) times daily before a meal. ) 30 tablet 0  .  PROAIR HFA 108 (90 BASE) MCG/ACT inhaler Inhale 2 puffs into the lungs every 4 (four) hours as needed for wheezing or shortness of breath.     . simvastatin (ZOCOR) 40 MG tablet Take 40 mg by mouth daily at 6 PM.     . spironolactone (ALDACTONE) 25 MG tablet Take 0.5 tablets (12.5 mg total) by mouth daily. 45 tablet 3  . warfarin (JANTOVEN) 5 MG tablet TAKE 1 TABLET DAILY EXCEPT 2 TABLETS ON  FRIDAYS 100 tablet 3  . pantoprazole (PROTONIX) 40 MG tablet Take 1 tablet (40 mg total) by mouth 2 (two) times daily before a meal. 180 tablet 1  . sacubitril-valsartan (ENTRESTO) 24-26 MG Take 1 tablet by mouth 2 (two) times daily. (Patient not taking: Reported on 05/26/2020) 60 tablet 11   No current facility-administered medications for this visit.   Allergies:   Patient has no known allergies.   ROS: No syncope.  Physical Exam: VS:  BP 122/70   Pulse 68   Ht 6' (1.829 m)   Wt 200 lb 3.2 oz (90.8 kg)   SpO2 99%   BMI 27.15 kg/m , BMI Body mass index is 27.15 kg/m.  Wt Readings from Last 3 Encounters:  05/26/20 200 lb 3.2 oz (90.8 kg)  04/11/20 195 lb 6.4 oz (88.6 kg)  04/05/20 195 lb 9.6 oz (88.7 kg)    General: Patient appears comfortable at rest. HEENT: Conjunctiva and lids normal, wearing a mask. Neck: Supple, no elevated JVP or carotid bruits, no thyromegaly. Lungs: Clear to auscultation, nonlabored breathing at rest. Cardiac: Regular rate and rhythm, no S3, soft systolic murmur, no pericardial rub. Extremities: Trace ankle edema.  ECG:  An ECG dated 03/29/2020 was personally reviewed today and demonstrated:  Sinus rhythm with old inferior infarct pattern, fusion beat.  Recent Labwork: 01/20/2020: Magnesium 2.0 03/29/2020: ALT 24; AST 20; BUN 20; Creatinine, Ser 1.34; Hemoglobin 11.0; Platelets 408; Potassium 4.4; Sodium 136   Other Studies Reviewed Today:  Echocardiogram 02/23/2020: 1. Left ventricular ejection fraction, by estimation, is approximately  25%. The left ventricle has severely decreased function. The left  ventricle demonstrates regional wall motion abnormalities (see scoring  diagram/findings for description). The left  ventricular internal cavity size was mildly dilated. Left ventricular  diastolic parameters are indeterminate.  2. Calcified apical LV mural thrombus present. Also prominent apical  trabeculation.  3. Right ventricular systolic function is normal. The right ventricular  size is normal. Tricuspid regurgitation signal is inadequate for assessing  PA pressure.  4. Left atrial size was mildly dilated.  5. The mitral valve is abnormal, mild to moderate annular calcification  and restricted posterior leaflet motion. Moderate mitral valve  regurgitation.  6. The aortic valve is tricuspid,  moderate annular calcification. Aortic  valve regurgitation is not visualized.  7. The inferior vena cava is normal in size with greater than 50%  respiratory variability, suggesting right atrial pressure of 3 mmHg.   Assessment and Plan:  1.  Ischemic cardiomyopathy with LVEF approximately 25% and normal RV contraction.  He has chronic combined heart failure, clinically stable with reasonable fluid status at this time.  He continues to prefer conservative management, we have not pursued a cardiac catheterization.  Presuming Entresto assistance program goes through, this will be initiated following assessment of lab work at nephrologist next week.  Also continue Coreg, Aldactone, and Lasix.  2.  Previous history of stroke with known calcified apical LV mural thrombus.  Continue Coumadin along with aspirin which he has  tolerated well.  Recent INR therapeutic.  3.  CKD stage IIIb.  Keep follow-up with Dr. Theador Hawthorne.  Repeat lab work pending for next week.  Medication Adjustments/Labs and Tests Ordered: Current medicines are reviewed at length with the patient today.  Concerns regarding medicines are outlined above.   Tests Ordered: No orders of the defined types were placed in this encounter.   Medication Changes: No orders of the defined types were placed in this encounter.   Disposition:  Follow up 3 months in the [place] office.  Signed, Satira Sark, MD, [facility] 05/26/2020 9:02 AM    [facility] at [facility] [address] [place], [place] [address] Phone: [phone]; Fax: [phone]

## 2020-05-26 ENCOUNTER — Encounter: Payer: Self-pay | Admitting: Cardiology

## 2020-05-26 ENCOUNTER — Other Ambulatory Visit: Payer: Self-pay

## 2020-05-26 ENCOUNTER — Ambulatory Visit (INDEPENDENT_AMBULATORY_CARE_PROVIDER_SITE_OTHER): Payer: Medicare Other | Admitting: Cardiology

## 2020-05-26 VITALS — BP 122/70 | HR 68 | Ht 72.0 in | Wt 200.2 lb

## 2020-05-26 DIAGNOSIS — N1832 Chronic kidney disease, stage 3b: Secondary | ICD-10-CM | POA: Diagnosis not present

## 2020-05-26 DIAGNOSIS — I255 Ischemic cardiomyopathy: Secondary | ICD-10-CM | POA: Diagnosis not present

## 2020-05-26 DIAGNOSIS — I6350 Cerebral infarction due to unspecified occlusion or stenosis of unspecified cerebral artery: Secondary | ICD-10-CM

## 2020-05-26 NOTE — Patient Instructions (Signed)
Medication Instructions:  Your physician recommends that you continue on your current medications as directed. Please refer to the Current Medication list given to you today.  *If you need a refill on your cardiac medications before your next appointment, please call your pharmacy*   Lab Work: None today If you have labs (blood work) drawn today and your tests are completely normal, you will receive your results only by: . MyChart Message (if you have MyChart) OR . A paper copy in the mail If you have any lab test that is abnormal or we need to change your treatment, we will call you to review the results.   Testing/Procedures: None today   Follow-Up: At [facility], you and your health needs are our priority.  As part of our continuing mission to provide you with exceptional heart care, we have created designated Provider Care Teams.  These Care Teams include your primary Cardiologist (physician) and Advanced Practice Providers (APPs -  Physician Assistants and Nurse Practitioners) who all work together to provide you with the care you need, when you need it.  We recommend signing up for the patient portal called "MyChart".  Sign up information is provided on this After Visit Summary.  MyChart is used to connect with patients for Virtual Visits (Telemedicine).  Patients are able to view lab/test results, encounter notes, upcoming appointments, etc.  Non-urgent messages can be sent to your provider as well.   To learn more about what you can do with MyChart, go to [email or web address].com.    Your next appointment:   3 month(s)  The format for your next appointment:   In Person  Provider:   Samuel McDowell, MD   Other Instructions None      Thank you for choosing [facility] !         

## 2020-05-29 ENCOUNTER — Telehealth: Payer: Self-pay | Admitting: *Deleted

## 2020-05-29 NOTE — Telephone Encounter (Signed)
Called to notify pt that he has been approved for the [employer] patient assistance. No answer, unable to leave msg.

## 2020-05-30 DIAGNOSIS — I129 Hypertensive chronic kidney disease with stage 1 through stage 4 chronic kidney disease, or unspecified chronic kidney disease: Secondary | ICD-10-CM | POA: Diagnosis not present

## 2020-05-30 DIAGNOSIS — E7849 Other hyperlipidemia: Secondary | ICD-10-CM | POA: Diagnosis not present

## 2020-05-30 DIAGNOSIS — N183 Chronic kidney disease, stage 3 unspecified: Secondary | ICD-10-CM | POA: Diagnosis not present

## 2020-05-30 DIAGNOSIS — E1122 Type 2 diabetes mellitus with diabetic chronic kidney disease: Secondary | ICD-10-CM | POA: Diagnosis not present

## 2020-05-31 DIAGNOSIS — N189 Chronic kidney disease, unspecified: Secondary | ICD-10-CM | POA: Diagnosis not present

## 2020-05-31 DIAGNOSIS — E1122 Type 2 diabetes mellitus with diabetic chronic kidney disease: Secondary | ICD-10-CM | POA: Diagnosis not present

## 2020-05-31 DIAGNOSIS — D638 Anemia in other chronic diseases classified elsewhere: Secondary | ICD-10-CM | POA: Diagnosis not present

## 2020-05-31 DIAGNOSIS — I5042 Chronic combined systolic (congestive) and diastolic (congestive) heart failure: Secondary | ICD-10-CM | POA: Diagnosis not present

## 2020-05-31 DIAGNOSIS — I129 Hypertensive chronic kidney disease with stage 1 through stage 4 chronic kidney disease, or unspecified chronic kidney disease: Secondary | ICD-10-CM | POA: Diagnosis not present

## 2020-06-01 ENCOUNTER — Telehealth: Payer: Self-pay

## 2020-06-01 LAB — BASIC METABOLIC PANEL
BUN/Creatinine Ratio: 13 (calc) (ref 6–22)
BUN: 28 mg/dL — ABNORMAL HIGH (ref 7–25)
CO2: 25 mmol/L (ref 20–32)
Calcium: 8.9 mg/dL (ref 8.6–10.3)
Chloride: 104 mmol/L (ref 98–110)
Creat: 2.21 mg/dL — ABNORMAL HIGH (ref 0.70–1.18)
Glucose, Bld: 252 mg/dL — ABNORMAL HIGH (ref 65–139)
Potassium: 4.9 mmol/L (ref 3.5–5.3)
Sodium: 138 mmol/L (ref 135–146)

## 2020-06-01 NOTE — Telephone Encounter (Signed)
-----   Message from Merlene Laughter, RN sent at 06/01/2020  9:18 AM EDT -----  ----- Message ----- From: Satira Sark, MD Sent: 06/01/2020   8:33 AM EDT To: Merlene Laughter, RN  Results reviewed.  Creatinine has bumped up to 2.21, previously 1.34.  Potassium normal at 4.9.  Is he actually back on Entresto or not?

## 2020-06-01 NOTE — Telephone Encounter (Signed)
Left message to return call-cc

## 2020-06-02 ENCOUNTER — Telehealth: Payer: Self-pay

## 2020-06-02 DIAGNOSIS — Z79899 Other long term (current) drug therapy: Secondary | ICD-10-CM

## 2020-06-02 NOTE — Telephone Encounter (Signed)
I spoke with patient.He states he is awaiting shipment of Entresto any day now.

## 2020-06-02 NOTE — Telephone Encounter (Signed)
-----   Message from Merlene Laughter, RN sent at 06/01/2020  9:18 AM EDT -----  ----- Message ----- From: Satira Sark, MD Sent: 06/01/2020   8:33 AM EDT To: Merlene Laughter, RN  Results reviewed.  Creatinine has bumped up to 2.21, previously 1.34.  Potassium normal at 4.9.  Is he actually back on Entresto or not?

## 2020-06-03 DIAGNOSIS — Z23 Encounter for immunization: Secondary | ICD-10-CM | POA: Diagnosis not present

## 2020-06-04 NOTE — Telephone Encounter (Signed)
In that case let's have him stop Aldactone and repeat BMET in about a week.  Hold off on starting Entresto until he we see his follow-up lab work.

## 2020-06-05 NOTE — Telephone Encounter (Signed)
I spoke with patient.He will stop aldactone and not take Entresto yet.He will repeat BMET next Monday 06/12/20

## 2020-06-05 NOTE — Addendum Note (Signed)
Addended by: Barbarann Ehlers A on: 06/05/2020 09:34 AM   Modules accepted: Orders

## 2020-06-08 DIAGNOSIS — N170 Acute kidney failure with tubular necrosis: Secondary | ICD-10-CM | POA: Diagnosis not present

## 2020-06-08 DIAGNOSIS — N189 Chronic kidney disease, unspecified: Secondary | ICD-10-CM | POA: Diagnosis not present

## 2020-06-08 DIAGNOSIS — D638 Anemia in other chronic diseases classified elsewhere: Secondary | ICD-10-CM | POA: Diagnosis not present

## 2020-06-08 DIAGNOSIS — I5042 Chronic combined systolic (congestive) and diastolic (congestive) heart failure: Secondary | ICD-10-CM | POA: Diagnosis not present

## 2020-06-08 DIAGNOSIS — I129 Hypertensive chronic kidney disease with stage 1 through stage 4 chronic kidney disease, or unspecified chronic kidney disease: Secondary | ICD-10-CM | POA: Diagnosis not present

## 2020-06-08 DIAGNOSIS — E1122 Type 2 diabetes mellitus with diabetic chronic kidney disease: Secondary | ICD-10-CM | POA: Diagnosis not present

## 2020-06-12 ENCOUNTER — Other Ambulatory Visit (HOSPITAL_COMMUNITY)
Admission: RE | Admit: 2020-06-12 | Discharge: 2020-06-12 | Disposition: A | Discharge status: Home / Self Care | Payer: Medicare Other | Source: Ambulatory Visit | Attending: Cardiology | Admitting: Cardiology

## 2020-06-12 ENCOUNTER — Telehealth: Payer: Self-pay

## 2020-06-12 DIAGNOSIS — Z79899 Other long term (current) drug therapy: Secondary | ICD-10-CM | POA: Insufficient documentation

## 2020-06-12 LAB — BASIC METABOLIC PANEL
Anion gap: 8 (ref 5–15)
BUN: 28 mg/dL — ABNORMAL HIGH (ref 8–23)
CO2: 24 mmol/L (ref 22–32)
Calcium: 9.2 mg/dL (ref 8.9–10.3)
Chloride: 105 mmol/L (ref 98–111)
Creatinine, Ser: 1.64 mg/dL — ABNORMAL HIGH (ref 0.61–1.24)
GFR, Estimated: 44 mL/min — ABNORMAL LOW (ref >60)
Glucose, Bld: 143 mg/dL — ABNORMAL HIGH (ref 70–99)
Potassium: 4.7 mmol/L (ref 3.5–5.1)
Sodium: 137 mmol/L (ref 135–145)

## 2020-06-12 NOTE — Telephone Encounter (Signed)
-----   Message from Satira Sark, MD sent at 06/12/2020 11:35 AM EST ----- Results reviewed.  Renal function looks somewhat better, creatinine down to 1.64 potassium normal.  We stopped Aldactone. already  If Dennis Yu was approved, let's cautiously start 24/26 mg twice daily.  Keep an eye on blood pressure.  Recheck BMET in 7 to 10 days.

## 2020-06-12 NOTE — Telephone Encounter (Signed)
Patient given lab results, will start Entresto 24-26 mg BID. He will monitor BP for any low readings. He is having BMET on 11/17 by nephrologist, we will request results.

## 2020-06-15 ENCOUNTER — Ambulatory Visit (INDEPENDENT_AMBULATORY_CARE_PROVIDER_SITE_OTHER): Payer: Medicare Other | Admitting: *Deleted

## 2020-06-15 DIAGNOSIS — Z5181 Encounter for therapeutic drug level monitoring: Secondary | ICD-10-CM

## 2020-06-15 DIAGNOSIS — I6350 Cerebral infarction due to unspecified occlusion or stenosis of unspecified cerebral artery: Secondary | ICD-10-CM

## 2020-06-15 DIAGNOSIS — Z86718 Personal history of other venous thrombosis and embolism: Secondary | ICD-10-CM | POA: Diagnosis not present

## 2020-06-15 DIAGNOSIS — Z7901 Long term (current) use of anticoagulants: Secondary | ICD-10-CM | POA: Diagnosis not present

## 2020-06-15 DIAGNOSIS — I639 Cerebral infarction, unspecified: Secondary | ICD-10-CM | POA: Diagnosis not present

## 2020-06-15 LAB — POCT INR: INR: 2.3 (ref 2.0–3.0)

## 2020-06-15 NOTE — Patient Instructions (Signed)
Continue warfarin 1 tablet daily  Recheck in 6 weeks Pt cancelling EGD scheduled for November

## 2020-06-19 DIAGNOSIS — D638 Anemia in other chronic diseases classified elsewhere: Secondary | ICD-10-CM | POA: Diagnosis not present

## 2020-06-19 DIAGNOSIS — N189 Chronic kidney disease, unspecified: Secondary | ICD-10-CM | POA: Diagnosis not present

## 2020-06-19 DIAGNOSIS — I5042 Chronic combined systolic (congestive) and diastolic (congestive) heart failure: Secondary | ICD-10-CM | POA: Diagnosis not present

## 2020-06-19 DIAGNOSIS — I129 Hypertensive chronic kidney disease with stage 1 through stage 4 chronic kidney disease, or unspecified chronic kidney disease: Secondary | ICD-10-CM | POA: Diagnosis not present

## 2020-06-19 DIAGNOSIS — N170 Acute kidney failure with tubular necrosis: Secondary | ICD-10-CM | POA: Diagnosis not present

## 2020-06-19 DIAGNOSIS — E1122 Type 2 diabetes mellitus with diabetic chronic kidney disease: Secondary | ICD-10-CM | POA: Diagnosis not present

## 2020-06-20 DIAGNOSIS — I5032 Chronic diastolic (congestive) heart failure: Secondary | ICD-10-CM | POA: Diagnosis not present

## 2020-06-20 DIAGNOSIS — I130 Hypertensive heart and chronic kidney disease with heart failure and stage 1 through stage 4 chronic kidney disease, or unspecified chronic kidney disease: Secondary | ICD-10-CM | POA: Diagnosis not present

## 2020-06-20 DIAGNOSIS — E1122 Type 2 diabetes mellitus with diabetic chronic kidney disease: Secondary | ICD-10-CM | POA: Diagnosis not present

## 2020-06-20 DIAGNOSIS — I10 Essential (primary) hypertension: Secondary | ICD-10-CM | POA: Diagnosis not present

## 2020-06-20 DIAGNOSIS — N189 Chronic kidney disease, unspecified: Secondary | ICD-10-CM | POA: Diagnosis not present

## 2020-06-20 DIAGNOSIS — E782 Mixed hyperlipidemia: Secondary | ICD-10-CM | POA: Diagnosis not present

## 2020-06-22 ENCOUNTER — Other Ambulatory Visit (HOSPITAL_COMMUNITY): Payer: Medicare Other

## 2020-06-23 DIAGNOSIS — N170 Acute kidney failure with tubular necrosis: Secondary | ICD-10-CM | POA: Diagnosis not present

## 2020-06-23 DIAGNOSIS — E875 Hyperkalemia: Secondary | ICD-10-CM | POA: Diagnosis not present

## 2020-06-23 DIAGNOSIS — I129 Hypertensive chronic kidney disease with stage 1 through stage 4 chronic kidney disease, or unspecified chronic kidney disease: Secondary | ICD-10-CM | POA: Diagnosis not present

## 2020-06-23 DIAGNOSIS — D638 Anemia in other chronic diseases classified elsewhere: Secondary | ICD-10-CM | POA: Diagnosis not present

## 2020-06-23 DIAGNOSIS — E1122 Type 2 diabetes mellitus with diabetic chronic kidney disease: Secondary | ICD-10-CM | POA: Diagnosis not present

## 2020-06-23 DIAGNOSIS — N189 Chronic kidney disease, unspecified: Secondary | ICD-10-CM | POA: Diagnosis not present

## 2020-06-26 ENCOUNTER — Ambulatory Visit (HOSPITAL_COMMUNITY): Admit: 2020-06-26 | Payer: Medicare Other | Admitting: Internal Medicine

## 2020-06-26 ENCOUNTER — Encounter (HOSPITAL_COMMUNITY): Payer: Self-pay

## 2020-06-26 SURGERY — ESOPHAGOGASTRODUODENOSCOPY (EGD) WITH PROPOFOL
Anesthesia: Monitor Anesthesia Care

## 2020-07-03 DIAGNOSIS — I129 Hypertensive chronic kidney disease with stage 1 through stage 4 chronic kidney disease, or unspecified chronic kidney disease: Secondary | ICD-10-CM | POA: Diagnosis not present

## 2020-07-03 DIAGNOSIS — N170 Acute kidney failure with tubular necrosis: Secondary | ICD-10-CM | POA: Diagnosis not present

## 2020-07-03 DIAGNOSIS — E875 Hyperkalemia: Secondary | ICD-10-CM | POA: Diagnosis not present

## 2020-07-03 DIAGNOSIS — N189 Chronic kidney disease, unspecified: Secondary | ICD-10-CM | POA: Diagnosis not present

## 2020-07-03 DIAGNOSIS — E1122 Type 2 diabetes mellitus with diabetic chronic kidney disease: Secondary | ICD-10-CM | POA: Diagnosis not present

## 2020-07-03 DIAGNOSIS — D638 Anemia in other chronic diseases classified elsewhere: Secondary | ICD-10-CM | POA: Diagnosis not present

## 2020-07-05 ENCOUNTER — Ambulatory Visit (INDEPENDENT_AMBULATORY_CARE_PROVIDER_SITE_OTHER): Payer: Medicare Other | Admitting: Nurse Practitioner

## 2020-07-05 ENCOUNTER — Other Ambulatory Visit: Payer: Self-pay

## 2020-07-05 ENCOUNTER — Encounter: Payer: Self-pay | Admitting: Nurse Practitioner

## 2020-07-05 ENCOUNTER — Encounter: Payer: Self-pay | Admitting: Internal Medicine

## 2020-07-05 VITALS — BP 132/79 | HR 72 | Temp 96.9°F | Ht 72.0 in | Wt 206.8 lb

## 2020-07-05 DIAGNOSIS — R1010 Upper abdominal pain, unspecified: Secondary | ICD-10-CM | POA: Diagnosis not present

## 2020-07-05 DIAGNOSIS — K265 Chronic or unspecified duodenal ulcer with perforation: Secondary | ICD-10-CM

## 2020-07-05 DIAGNOSIS — I6350 Cerebral infarction due to unspecified occlusion or stenosis of unspecified cerebral artery: Secondary | ICD-10-CM

## 2020-07-05 NOTE — Progress Notes (Signed)
Cc'ed to pcp °

## 2020-07-05 NOTE — Progress Notes (Signed)
Referring Provider: [name], MD Primary Care Physician:  [name], MD Primary GI:  Dr. Gala Romney  Chief Complaint  Patient presents with  . Anemia    HPI:   Dennis Yu is a 74 y.o. male who presents for follow-up on IDA and abdominal pain.  The patient was last seen in our office 04/05/2020 for perforated duodenal ulcer, IDA, upper abdominal pain.  Previously referred by hematology/oncology for colonoscopy recommendation due to 6 months of diarrhea, IDA managed by hematology with uptrend in ferritin over the past 10 months.  Previous loose stools since starting iron, history of ischemic cardiomyopathy last evaluated by cardiology at the end of 2020 and on Coumadin dosing followed by anticoagulation clinic.  LVEF approximately 25% and unable to stay on Entresto due to cost so he is now on Benicar and Aldactone.  Also on Coreg and Lasix.  Colonoscopy updated 01/11/2020 with diverticular disease, a single sessile serrated polyp without cytologic dysplasia and negative colon biopsies.  Recommended repeat in 5 years (2026) only if overall health permits.  Previous hospitalization in June 2021 for ischemic cardiomyopathy.  At which point he started having epigastric abdominal pain admitted for likely perforated duodenal ulcer and maintained on TPN for several days as well as Zosyn and fluconazole.  Upper GI/SBFT study with no leakage and diet was advanced and discharged on PPI twice daily with recommended EGD in 6 to 8 weeks.  He again reported to the emergency department in August of this year for a myriad of symptoms.  At that time he noted some upper abdominal discomfort but pain improved, increased constipation relieved with a laxative.  Hemoglobin was stable at 11.0.  CT scans noted inflammatory changes adjacent to the duodenum without evidence of perforation or abscess, duodenal wall thickening.  Differentials for his imaging included peptic ulcer disease or duodenal diverticulitis.   Symptoms felt to be more likely chronic than acute process.  It was also noted that the patient was no longer taking his PPI as previously recommended and at discharge he was recommended to continue with his PPI.  At his last visit he noted resolution of his pain for now.  Not taking Carafate due to sent to the wrong pharmacy, although he did not notify us of this.  Dark stools have resolved.  Some unintentional weight loss of 15 pounds subjectively since his last office visit, objectively down about 6 pounds in 4 months.  No worsening symptoms of CHF, currently following with cardiology.  Recommended continue PPI, fill Carafate and take as recommended, plan for EGD after clearance by cardiology, follow-up in 3 months.  The patient's EGD was initially scheduled for 06/26/2020, although he canceled indicating he did not want to have the procedure.  Today states doing okay overall. Abdominal pain remains resolved. Denies N/V, no GERD symptoms (currently Protonix bid). Denies hematochezia, melena. Dark stools stopped when he stopped taking iron. Denies fever, chills, unintentional weight loss. Denies URI or flu-like symptoms. Denies loss of sense of taste or smell. The patient has received COVID-19 vaccination(s). Denies chest pain, dyspnea, dizziness, lightheadedness, syncope, near syncope. Denies any other upper or lower GI symptoms. CHF doing pretty good, no significant edema, chest pain. Does have intermittent dyspnea which he feels is improving.  He continues to decline upper endoscopy.  Past Medical History:  Diagnosis Date  . Bronchitis 11/2012  . Cardiomyopathy    LVEF 25%  . Erectile dysfunction   . Essential hypertension   .  GERD (gastroesophageal reflux disease)   . History of colonic polyps   . Mixed hyperlipidemia   . 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 Obstructive sleep apnea   . Patent foramen ovale   . Phlebitis   . 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  Superficial phlebitis 12/10/2012  . Type 2 diabetes mellitus ([place])   . Vitamin D deficiency     Past Surgical History:  Procedure Laterality Date  . BIOPSY  01/11/2020   Procedure: BIOPSY;  Surgeon: Daneil Dolin, MD;  [facility];  Service: Endoscopy;;  . COLONOSCOPY WITH PROPOFOL N/A 01/11/2020   Procedure: COLONOSCOPY WITH PROPOFOL;  Surgeon: Daneil Dolin, MD;  [facility];  Service: Endoscopy;  Laterality: N/A;  10:15am  . NO PAST SURGERIES    . POLYPECTOMY  01/11/2020   Procedure: POLYPECTOMY;  Surgeon: Daneil Dolin, MD;  [facility];  Service: Endoscopy;;    Current Outpatient Medications  Medication Sig Dispense Refill  . aspirin 81 MG tablet Take 81 mg by mouth daily.      . carvedilol (COREG) 6.25 MG tablet Take 6.25 mg by mouth 2 (two) times daily with a meal.    . dicyclomine (BENTYL) 20 MG tablet Take 1 tablet (20 mg total) by mouth 2 (two) times daily as needed for spasms. 10 tablet 0  . furosemide (LASIX) 40 MG tablet Take 1.5 tablets (60 mg total) by mouth daily. 135 tablet 3  . gabapentin (NEURONTIN) 300 MG capsule Take 3 capsules (900 mg total) by mouth daily. (Patient taking differently: Take 300-600 mg by mouth See admin instructions. 300 mg during the day, 600 mg at bedtime) 90 capsule 11  . glipiZIDE (GLUCOTROL) 5 MG tablet Take 5 mg by mouth daily.     . NON FORMULARY legatrin pm Daily      . Omega-3 Fatty Acids (FISH OIL) 1000 MG CAPS Take 1,000 mg by mouth daily.     . pantoprazole (PROTONIX) 40 MG tablet Take 1 tablet (40 mg total) by mouth daily. (Patient taking differently: Take 40 mg by mouth 2 (two) times daily before a meal. ) 30 tablet 0  . pantoprazole (PROTONIX) 40 MG tablet Take 1 tablet (40 mg total) by mouth 2 (two) times daily before a meal. 180 tablet 1  . PROAIR HFA 108 (90 BASE) MCG/ACT inhaler Inhale 2 puffs into the lungs every 4 (four) hours as needed for wheezing or shortness of breath.     .  sacubitril-valsartan (ENTRESTO) 24-26 MG Take 1 tablet by mouth 2 (two) times daily. 60 tablet 11  . simvastatin (ZOCOR) 40 MG tablet Take 40 mg by mouth daily at 6 PM.     . warfarin (JANTOVEN) 5 MG tablet TAKE 1 TABLET DAILY EXCEPT 2 TABLETS ON  FRIDAYS 100 tablet 3   No current facility-administered medications for this visit.    Allergies as of 07/05/2020  . (No Known Allergies)    Family History  Problem Relation Age of Onset  . Diabetes Father   . Hypertension Other   . Colon cancer Neg Hx     Social History   Socioeconomic History  . Marital status: Married    Spouse name: Not on file  . Number of children: Not on file  . Years of education: Not on file  . Highest education level: Not on file  Occupational History  . Occupation: Administrator  Employer: RETIRED  Tobacco Use  . Smoking status: Former Smoker    Types: Cigarettes    Start date: 08/06/1967    Quit date: 08/06/2003    Years since quitting: 16.9  . Smokeless tobacco: Never Used  Vaping Use  . Vaping Use: Never used  Substance and Sexual Activity  . Alcohol use: No    Alcohol/week: 0.0 standard drinks  . Drug use: No  . Sexual activity: Not Currently  Other Topics Concern  . Not on file  Social History Narrative  . Not on file   Social Determinants of Health   Financial Resource Strain:   . Difficulty of Paying Living Expenses: Not on file  Food Insecurity:   . Worried About Charity fundraiser in the Last Year: Not on file  . Ran Out of Food in the Last Year: Not on file  Transportation Needs:   . Lack of Transportation (Medical): Not on file  . Lack of Transportation (Non-Medical): Not on file  Physical Activity:   . Days of Exercise per Week: Not on file  . Minutes of Exercise per Session: Not on file  Stress:   . Feeling of Stress : Not on file  Social Connections:   . Frequency of Communication with Friends and Family: Not on file  . Frequency of Social Gatherings with Friends and  Family: Not on file  . Attends Religious Services: Not on file  . Active Member of Clubs or Organizations: Not on file  . Attends Archivist Meetings: Not on file  . Marital Status: Not on file    Subjective: Review of Systems  Constitutional: Negative for chills, fever, malaise/fatigue and weight loss.  HENT: Negative for congestion and sore throat.   Respiratory: Negative for cough and shortness of breath.   Cardiovascular: Negative for chest pain and palpitations.  Gastrointestinal: Negative for abdominal pain, blood in stool, diarrhea, heartburn, melena, nausea and vomiting.  Musculoskeletal: Negative for joint pain and myalgias.  Skin: Negative for rash.  Neurological: Negative for dizziness and weakness.  Endo/Heme/Allergies: Does not bruise/bleed easily.  Psychiatric/Behavioral: Negative for depression. The patient is not nervous/anxious.   All other systems reviewed and are negative.    Objective: BP 132/79   Pulse 72   Temp (!) 96.9 F (36.1 C) (Temporal)   Ht 6' (1.829 m)   Wt 206 lb 12.8 oz (93.8 kg)   BMI 28.05 kg/m  Physical Exam Vitals and nursing note reviewed.  Constitutional:      General: He is not in acute distress.    Appearance: Normal appearance. He is normal weight. He is not ill-appearing, toxic-appearing or diaphoretic.  HENT:     Head: Normocephalic and atraumatic.     Nose: No congestion or rhinorrhea.  Eyes:     General: No scleral icterus. Cardiovascular:     Rate and Rhythm: Normal rate and regular rhythm.     Heart sounds: Normal heart sounds.  Pulmonary:     Effort: Pulmonary effort is normal.     Breath sounds: Normal breath sounds.  Abdominal:     General: Bowel sounds are normal. There is no distension.     Palpations: Abdomen is soft. There is no hepatomegaly, splenomegaly or mass.     Tenderness: There is no abdominal tenderness. There is no guarding or rebound.     Hernia: No hernia is present.  Musculoskeletal:      Cervical back: Neck supple.  Skin:    General: Skin  is warm and dry.     Coloration: Skin is not jaundiced.     Findings: No bruising or rash.  Neurological:     General: No focal deficit present.     Mental Status: He is alert and oriented to person, place, and time. Mental status is at baseline.  Psychiatric:        Mood and Affect: Mood normal.        Behavior: Behavior normal.        Thought Content: Thought content normal.      Assessment:  Very pleasant 74 year old male presents for follow-up on abdominal pain, GERD, and recent CT imaging with a likely perforated duodenal ulcer.  His symptoms has generally improved over the past several months.  CT imaging showed appears to be resolution with no obvious perforation.  However, there is still duodenal wall thickening and he was previously recommended to have an EGD which he called to cancel stating he did not want the procedure.  No red flag/warning signs or symptoms today.  Also following up on iron deficiency anemia.  GERD with abdominal pain and history of likely perforated duodenal ulcer: Fortunately, he has been compliant with his PPI previously he was and had recurrent ER visits for abdominal pain.  He continues to decline EGD for evaluation of his upper GI tract.  Given that his symptoms are well managed on Protonix 40 mg twice daily I recommend he continue this for now.  IDA: Denies any obvious hematochezia or melena.  Most recent CBC in end of August this year with stable hemoglobin at 11.0.  Likely multifactorial anemia with significant component of anemia of chronic disease, currently followed by nephrology.  Recommend he notify us of any obvious bleeding.   Plan: 1. Continue current medications including Protonix 40 mg twice daily 2. Follow-up in 6 months 3. Call for worsening symptoms.    Thank you for allowing [place] to participate in the care of [place], DNP, AGNP-C Adult & Gerontological Nurse  Practitioner [facility]   07/05/2020 9:45 AM   Disclaimer: This note was dictated with voice recognition software. Similar sounding words can inadvertently be transcribed and may not be corrected upon review.

## 2020-07-05 NOTE — Patient Instructions (Addendum)
Your health issues we discussed today were:   GERD (reflux/heartburn) with abdominal pain: 1. I am glad you are feeling better! 2. Continue taking your current medication, specifically Protonix 40 mg twice daily 3. Let us know if you have any worsening or severe symptoms 4. Let us know if you change your mind about wanting an upper endoscopy  Overall I recommend:  1. Continue your other current medications 2. Return for follow-up in 6 months 3. Call us if you have any questions or concerns   ---------------------------------------------------------------  I am glad you have gotten your COVID-19 vaccination!  Even though you are fully vaccinated you should continue to follow CDC and state/local guidelines.  ---------------------------------------------------------------   At [facility] we value your feedback. You may receive a survey about your visit today. Please share your experience as we strive to create trusting relationships with our patients to provide genuine, compassionate, quality care.  We appreciate your understanding and patience as we review any laboratory studies, imaging, and other diagnostic tests that are ordered as we care for you. Our office policy is 5 business days for review of these results, and any emergent or urgent results are addressed in a timely manner for your best interest. If you do not hear from our office in 1 week, please contact us.   We also encourage the use of MyChart, which contains your medical information for your review as well. If you are not enrolled in this feature, an access code is on this after visit summary for your convenience. Thank you for allowing [place] to be involved in your care.  It was great to see you today!  I hope you have a Merry Christmas and Happy Holidays!!

## 2020-07-14 DIAGNOSIS — E1142 Type 2 diabetes mellitus with diabetic polyneuropathy: Secondary | ICD-10-CM | POA: Diagnosis not present

## 2020-07-14 DIAGNOSIS — B351 Tinea unguium: Secondary | ICD-10-CM | POA: Diagnosis not present

## 2020-07-21 DIAGNOSIS — R0602 Shortness of breath: Secondary | ICD-10-CM | POA: Diagnosis not present

## 2020-07-21 DIAGNOSIS — G9009 Other idiopathic peripheral autonomic neuropathy: Secondary | ICD-10-CM | POA: Diagnosis not present

## 2020-07-21 DIAGNOSIS — R944 Abnormal results of kidney function studies: Secondary | ICD-10-CM | POA: Diagnosis not present

## 2020-07-21 DIAGNOSIS — I129 Hypertensive chronic kidney disease with stage 1 through stage 4 chronic kidney disease, or unspecified chronic kidney disease: Secondary | ICD-10-CM | POA: Diagnosis not present

## 2020-07-21 DIAGNOSIS — E782 Mixed hyperlipidemia: Secondary | ICD-10-CM | POA: Diagnosis not present

## 2020-07-21 DIAGNOSIS — R1084 Generalized abdominal pain: Secondary | ICD-10-CM | POA: Diagnosis not present

## 2020-07-21 DIAGNOSIS — D509 Iron deficiency anemia, unspecified: Secondary | ICD-10-CM | POA: Diagnosis not present

## 2020-07-21 DIAGNOSIS — I482 Chronic atrial fibrillation, unspecified: Secondary | ICD-10-CM | POA: Diagnosis not present

## 2020-07-21 DIAGNOSIS — I10 Essential (primary) hypertension: Secondary | ICD-10-CM | POA: Diagnosis not present

## 2020-07-21 DIAGNOSIS — N1831 Chronic kidney disease, stage 3a: Secondary | ICD-10-CM | POA: Diagnosis not present

## 2020-07-21 DIAGNOSIS — E0822 Diabetes mellitus due to underlying condition with diabetic chronic kidney disease: Secondary | ICD-10-CM | POA: Diagnosis not present

## 2020-07-21 DIAGNOSIS — E119 Type 2 diabetes mellitus without complications: Secondary | ICD-10-CM | POA: Diagnosis not present

## 2020-07-25 ENCOUNTER — Ambulatory Visit (INDEPENDENT_AMBULATORY_CARE_PROVIDER_SITE_OTHER): Payer: Medicare Other | Admitting: *Deleted

## 2020-07-25 DIAGNOSIS — I6350 Cerebral infarction due to unspecified occlusion or stenosis of unspecified cerebral artery: Secondary | ICD-10-CM

## 2020-07-25 DIAGNOSIS — Z7901 Long term (current) use of anticoagulants: Secondary | ICD-10-CM

## 2020-07-25 DIAGNOSIS — Z5181 Encounter for therapeutic drug level monitoring: Secondary | ICD-10-CM

## 2020-07-25 DIAGNOSIS — Z86718 Personal history of other venous thrombosis and embolism: Secondary | ICD-10-CM

## 2020-07-25 DIAGNOSIS — I639 Cerebral infarction, unspecified: Secondary | ICD-10-CM

## 2020-07-25 LAB — POCT INR: INR: 2.2 (ref 2.0–3.0)

## 2020-07-25 NOTE — Patient Instructions (Signed)
Continue warfarin 1 tablet daily  Recheck in 6 weeks.  

## 2020-07-26 DIAGNOSIS — E1122 Type 2 diabetes mellitus with diabetic chronic kidney disease: Secondary | ICD-10-CM | POA: Diagnosis not present

## 2020-07-26 DIAGNOSIS — I5032 Chronic diastolic (congestive) heart failure: Secondary | ICD-10-CM | POA: Diagnosis not present

## 2020-07-26 DIAGNOSIS — G9009 Other idiopathic peripheral autonomic neuropathy: Secondary | ICD-10-CM | POA: Diagnosis not present

## 2020-07-26 DIAGNOSIS — D509 Iron deficiency anemia, unspecified: Secondary | ICD-10-CM | POA: Diagnosis not present

## 2020-07-26 DIAGNOSIS — Z6827 Body mass index (BMI) 27.0-27.9, adult: Secondary | ICD-10-CM | POA: Diagnosis not present

## 2020-07-26 DIAGNOSIS — N1831 Chronic kidney disease, stage 3a: Secondary | ICD-10-CM | POA: Diagnosis not present

## 2020-07-26 DIAGNOSIS — Z8673 Personal history of transient ischemic attack (TIA), and cerebral infarction without residual deficits: Secondary | ICD-10-CM | POA: Diagnosis not present

## 2020-07-26 DIAGNOSIS — I482 Chronic atrial fibrillation, unspecified: Secondary | ICD-10-CM | POA: Diagnosis not present

## 2020-07-26 DIAGNOSIS — E663 Overweight: Secondary | ICD-10-CM | POA: Diagnosis not present

## 2020-07-26 DIAGNOSIS — E782 Mixed hyperlipidemia: Secondary | ICD-10-CM | POA: Diagnosis not present

## 2020-07-26 DIAGNOSIS — G4733 Obstructive sleep apnea (adult) (pediatric): Secondary | ICD-10-CM | POA: Diagnosis not present

## 2020-07-26 DIAGNOSIS — I129 Hypertensive chronic kidney disease with stage 1 through stage 4 chronic kidney disease, or unspecified chronic kidney disease: Secondary | ICD-10-CM | POA: Diagnosis not present

## 2020-08-29 DIAGNOSIS — D638 Anemia in other chronic diseases classified elsewhere: Secondary | ICD-10-CM | POA: Diagnosis not present

## 2020-08-29 DIAGNOSIS — N170 Acute kidney failure with tubular necrosis: Secondary | ICD-10-CM | POA: Diagnosis not present

## 2020-08-29 DIAGNOSIS — E875 Hyperkalemia: Secondary | ICD-10-CM | POA: Diagnosis not present

## 2020-08-29 DIAGNOSIS — N189 Chronic kidney disease, unspecified: Secondary | ICD-10-CM | POA: Diagnosis not present

## 2020-08-29 DIAGNOSIS — I129 Hypertensive chronic kidney disease with stage 1 through stage 4 chronic kidney disease, or unspecified chronic kidney disease: Secondary | ICD-10-CM | POA: Diagnosis not present

## 2020-08-29 DIAGNOSIS — E1122 Type 2 diabetes mellitus with diabetic chronic kidney disease: Secondary | ICD-10-CM | POA: Diagnosis not present

## 2020-08-30 DIAGNOSIS — E1122 Type 2 diabetes mellitus with diabetic chronic kidney disease: Secondary | ICD-10-CM | POA: Diagnosis not present

## 2020-08-30 DIAGNOSIS — E875 Hyperkalemia: Secondary | ICD-10-CM | POA: Diagnosis not present

## 2020-08-30 DIAGNOSIS — E559 Vitamin D deficiency, unspecified: Secondary | ICD-10-CM | POA: Diagnosis not present

## 2020-08-30 DIAGNOSIS — I129 Hypertensive chronic kidney disease with stage 1 through stage 4 chronic kidney disease, or unspecified chronic kidney disease: Secondary | ICD-10-CM | POA: Diagnosis not present

## 2020-08-30 DIAGNOSIS — I5042 Chronic combined systolic (congestive) and diastolic (congestive) heart failure: Secondary | ICD-10-CM | POA: Diagnosis not present

## 2020-08-30 DIAGNOSIS — N189 Chronic kidney disease, unspecified: Secondary | ICD-10-CM | POA: Diagnosis not present

## 2020-08-30 DIAGNOSIS — D638 Anemia in other chronic diseases classified elsewhere: Secondary | ICD-10-CM | POA: Diagnosis not present

## 2020-08-30 NOTE — Progress Notes (Signed)
Cardiology Office Note  Date: 08/31/2020   ID: Kitt, Ledet [birth year], MRN [id number]  PCP:  [name], MD  Cardiologist:  Rozann Lesches, MD Electrophysiologist:  None   Chief Complaint  Patient presents with  . Cardiac follow-up    History of Present Illness: Dennis Yu is a 75 y.o. male last seen in October 2021.  He presents for a follow-up visit.  He is now on Entresto, tolerating well so far. He had a recent visit with Dr. Theador Hawthorne, I reviewed the note.  Recent creatinine 1.65 with potassium 4.6.  He remains on Coumadin with follow-up in the anticoagulation clinic.  Last INR was 2.2.  No reported bleeding problems.  I reviewed his medications which are outlined below.  Still on Lasix 660 mg daily.  He reports some leg swelling at the end of the day when he has been up, resolves by the next morning.  Weight relatively stable by home scales.  No orthopnea or PND.  He reports NYHA class II dyspnea with typical activities.   Past Medical History:  Diagnosis Date  . Bronchitis 11/2012  . Cardiomyopathy    LVEF 25%  . Erectile dysfunction   . Essential hypertension   . GERD (gastroesophageal reflux disease)   . History of colonic polyps   . Mixed hyperlipidemia   . 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 Obstructive sleep apnea   . Patent foramen ovale   . Phlebitis   . Stroke [facility])    Embolic left temporal 5/00 s/p tPA  . Superficial phlebitis 12/10/2012  . Type 2 diabetes mellitus ([place])   . Vitamin D deficiency     Past Surgical History:  Procedure Laterality Date  . BIOPSY  01/11/2020   Procedure: BIOPSY;  Surgeon: Daneil Dolin, MD;  [facility];  Service: Endoscopy;;  . COLONOSCOPY WITH PROPOFOL N/A 01/11/2020   Procedure: COLONOSCOPY WITH PROPOFOL;  Surgeon: Daneil Dolin, MD;  [facility];  Service: Endoscopy;  Laterality: N/A;  10:15am  . NO PAST SURGERIES    . POLYPECTOMY  01/11/2020   Procedure: POLYPECTOMY;   Surgeon: Daneil Dolin, MD;  [facility];  Service: Endoscopy;;    Current Outpatient Medications  Medication Sig Dispense Refill  . aspirin 81 MG tablet Take 81 mg by mouth daily.    . carvedilol (COREG) 6.25 MG tablet Take 6.25 mg by mouth 2 (two) times daily with a meal.    . dicyclomine (BENTYL) 20 MG tablet Take 1 tablet (20 mg total) by mouth 2 (two) times daily as needed for spasms. 10 tablet 0  . furosemide (LASIX) 40 MG tablet Take 1.5 tablets (60 mg total) by mouth daily. 135 tablet 3  . gabapentin (NEURONTIN) 300 MG capsule Take 3 capsules (900 mg total) by mouth daily. (Patient taking differently: Take 300-600 mg by mouth See admin instructions. 300 mg during the day, 600 mg at bedtime) 90 capsule 11  . glipiZIDE (GLUCOTROL) 5 MG tablet Take 5 mg by mouth daily.     . NON FORMULARY legatrin pm Daily    . Omega-3 Fatty Acids (FISH OIL) 1000 MG CAPS Take 1,000 mg by mouth daily.     . pantoprazole (PROTONIX) 40 MG tablet Take 1 tablet (40 mg total) by mouth daily. (Patient taking differently: Take 40 mg by mouth 2 (two) times daily before a meal.) 30 tablet 0  . pantoprazole (PROTONIX) 40 MG tablet Take  1 tablet (40 mg total) by mouth 2 (two) times daily before a meal. 180 tablet 1  . PROAIR HFA 108 (90 BASE) MCG/ACT inhaler Inhale 2 puffs into the lungs every 4 (four) hours as needed for wheezing or shortness of breath.     . simvastatin (ZOCOR) 40 MG tablet Take 40 mg by mouth daily at 6 PM.     . warfarin (JANTOVEN) 5 MG tablet TAKE 1 TABLET DAILY EXCEPT 2 TABLETS ON  FRIDAYS 100 tablet 3  . sacubitril-valsartan (ENTRESTO) 24-26 MG Take 1 tablet by mouth 2 (two) times daily. 180 tablet 2   No current facility-administered medications for this visit.   Allergies:  Patient has no known allergies.   ROS: No palpitations or syncope.  Physical Exam: VS:  BP 104/64   Pulse 62   Ht 6' (1.829 m)   Wt 205 lb (93 kg)   SpO2 96%   BMI 27.80 kg/m , BMI Body mass index  is 27.8 kg/m.  Wt Readings from Last 3 Encounters:  08/31/20 205 lb (93 kg)  07/05/20 206 lb 12.8 oz (93.8 kg)  05/26/20 200 lb 3.2 oz (90.8 kg)    General: Patient appears comfortable at rest. HEENT: Conjunctiva and lids normal, wearing a mask. Neck: Supple, no elevated JVP or carotid bruits, no thyromegaly. Lungs: Clear to auscultation, nonlabored breathing at rest. Cardiac: Regular rate and rhythm, no S3, soft systolic murmur, no pericardial rub. Extremities: Trace lower leg edema.  Compression socks in place.  ECG:  An ECG dated 03/29/2020 was personally reviewed today and demonstrated:  Sinus rhythm with old inferior infarct pattern, fusion beat.  Recent Labwork: 01/20/2020: Magnesium 2.0 03/29/2020: ALT 24; AST 20; Hemoglobin 11.0; Platelets 408 06/12/2020: BUN 28; Creatinine, Ser 1.64; Potassium 4.7; Sodium 137     Component Value Date/Time   CHOL 145 04/05/2011 0545   TRIG 143 01/17/2020 0600   HDL 40 04/05/2011 0545   CHOLHDL 3.6 04/05/2011 0545   VLDL 13 04/05/2011 0545   LDLCALC 92 04/05/2011 0545    Other Studies Reviewed Today:  Echocardiogram 02/23/2020: 1. Left ventricular ejection fraction, by estimation, is approximately  25%. The left ventricle has severely decreased function. The left  ventricle demonstrates regional wall motion abnormalities (see scoring  diagram/findings for description). The left  ventricular internal cavity size was mildly dilated. Left ventricular  diastolic parameters are indeterminate.  2. Calcified apical LV muralthrombus present. Also prominent apical  trabeculation.  3. Right ventricular systolic function is normal. The right ventricular  size is normal. Tricuspid regurgitation signal is inadequate for assessing  PA pressure.  4. Left atrial size was mildly dilated.  5. The mitral valve is abnormal, mild to moderate annular calcification  and restricted posterior leaflet motion. Moderate mitral valve  regurgitation.  6.  The aortic valve is tricuspid, moderate annular calcification. Aortic  valve regurgitation is not visualized.  7. The inferior vena cava is normal in size with greater than 50%  respiratory variability, suggesting right atrial pressure of 3 mmHg.   Assessment and Plan:  1.  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  He has preferred conservative management, we have not pursued cardiac catheterization or ICD.  He is now tolerating Entresto, otherwise continue Coreg, Lasix, aspirin, and Zocor.  He has renal insufficiency with propensity to hyperkalemia, has been taken off of Aldactone.  No reported angina symptoms, no palpitations or syncope.  2.  CKD stage IIIb, renal function stable  with recent creatinine 1.65 and normal potassium.  He follows with Dr. Theador Hawthorne.  3.  History of prior stroke and known calcified apical LV mural thrombus.  He remains on Coumadin with follow-up in the anticoagulation clinic.  Medication Adjustments/Labs and Tests Ordered: Current medicines are reviewed at length with the patient today.  Concerns regarding medicines are outlined above.   Tests Ordered: No orders of the defined types were placed in this encounter.   Medication Changes: Meds ordered this encounter  Medications  . sacubitril-valsartan (ENTRESTO) 24-26 MG    Sig: Take 1 tablet by mouth 2 (two) times daily.    Dispense:  180 tablet    Refill:  2    Disposition:  Follow up 3 months in the [place] office.  Signed, Satira Sark, MD, [facility] 08/31/2020 8:39 AM    [place] [facility] [place], [place], [place] [address] Phone: [phone]; Fax: [phone]

## 2020-08-31 ENCOUNTER — Encounter: Payer: Self-pay | Admitting: Cardiology

## 2020-08-31 ENCOUNTER — Other Ambulatory Visit: Payer: Self-pay

## 2020-08-31 ENCOUNTER — Ambulatory Visit (INDEPENDENT_AMBULATORY_CARE_PROVIDER_SITE_OTHER): Payer: Medicare Other | Admitting: Cardiology

## 2020-08-31 ENCOUNTER — Encounter: Payer: Self-pay | Admitting: *Deleted

## 2020-08-31 VITALS — BP 104/64 | HR 62 | Ht 72.0 in | Wt 205.0 lb

## 2020-08-31 DIAGNOSIS — I5042 Chronic combined systolic (congestive) and diastolic (congestive) heart failure: Secondary | ICD-10-CM | POA: Insufficient documentation

## 2020-08-31 DIAGNOSIS — I255 Ischemic cardiomyopathy: Secondary | ICD-10-CM | POA: Diagnosis not present

## 2020-08-31 DIAGNOSIS — N1832 Chronic kidney disease, stage 3b: Secondary | ICD-10-CM | POA: Diagnosis not present

## 2020-08-31 MED ORDER — ENTRESTO 24-26 MG PO TABS: 1.0000 | ORAL_TABLET | Freq: Two times a day (BID) | ORAL | 2 refills | Status: DC

## 2020-08-31 NOTE — Patient Instructions (Addendum)
Medication Instructions:  Your physician recommends that you continue on your current medications as directed. Please refer to the Current Medication list given to you today.  Labwork: none  Testing/Procedures: none  Follow-Up: Your physician recommends that you schedule a follow-up appointment in: 3 months in [place]  Any Other Special Instructions Will Be Listed Below (If Applicable).  If you need a refill on your cardiac medications before your next appointment, please call your pharmacy. 

## 2020-09-02 DIAGNOSIS — I5032 Chronic diastolic (congestive) heart failure: Secondary | ICD-10-CM | POA: Diagnosis not present

## 2020-09-02 DIAGNOSIS — E782 Mixed hyperlipidemia: Secondary | ICD-10-CM | POA: Diagnosis not present

## 2020-09-02 DIAGNOSIS — E1122 Type 2 diabetes mellitus with diabetic chronic kidney disease: Secondary | ICD-10-CM | POA: Diagnosis not present

## 2020-09-02 DIAGNOSIS — I10 Essential (primary) hypertension: Secondary | ICD-10-CM | POA: Diagnosis not present

## 2020-09-05 ENCOUNTER — Ambulatory Visit (INDEPENDENT_AMBULATORY_CARE_PROVIDER_SITE_OTHER): Payer: Medicare Other | Admitting: *Deleted

## 2020-09-05 DIAGNOSIS — Z7901 Long term (current) use of anticoagulants: Secondary | ICD-10-CM

## 2020-09-05 DIAGNOSIS — I639 Cerebral infarction, unspecified: Secondary | ICD-10-CM

## 2020-09-05 DIAGNOSIS — I6350 Cerebral infarction due to unspecified occlusion or stenosis of unspecified cerebral artery: Secondary | ICD-10-CM

## 2020-09-05 DIAGNOSIS — Z5181 Encounter for therapeutic drug level monitoring: Secondary | ICD-10-CM

## 2020-09-05 DIAGNOSIS — Z86718 Personal history of other venous thrombosis and embolism: Secondary | ICD-10-CM | POA: Diagnosis not present

## 2020-09-05 LAB — POCT INR: INR: 2.3 (ref 2.0–3.0)

## 2020-09-05 NOTE — Patient Instructions (Signed)
Continue warfarin 1 tablet daily  Recheck in 6 weeks.  

## 2020-09-07 ENCOUNTER — Other Ambulatory Visit (HOSPITAL_COMMUNITY): Payer: Self-pay

## 2020-09-07 DIAGNOSIS — D509 Iron deficiency anemia, unspecified: Secondary | ICD-10-CM

## 2020-09-08 ENCOUNTER — Inpatient Hospital Stay (HOSPITAL_COMMUNITY): Payer: Medicare Other | Attending: Hematology

## 2020-09-08 ENCOUNTER — Encounter (HOSPITAL_COMMUNITY): Payer: Self-pay | Admitting: Hematology and Oncology

## 2020-09-08 ENCOUNTER — Other Ambulatory Visit: Payer: Self-pay

## 2020-09-08 ENCOUNTER — Inpatient Hospital Stay (HOSPITAL_BASED_OUTPATIENT_CLINIC_OR_DEPARTMENT_OTHER): Payer: Medicare Other | Admitting: Hematology and Oncology

## 2020-09-08 VITALS — BP 130/79 | HR 68 | Temp 98.8°F | Resp 17 | Wt 208.4 lb

## 2020-09-08 DIAGNOSIS — D509 Iron deficiency anemia, unspecified: Secondary | ICD-10-CM | POA: Diagnosis not present

## 2020-09-08 DIAGNOSIS — N189 Chronic kidney disease, unspecified: Secondary | ICD-10-CM | POA: Diagnosis not present

## 2020-09-08 DIAGNOSIS — Z7982 Long term (current) use of aspirin: Secondary | ICD-10-CM | POA: Insufficient documentation

## 2020-09-08 DIAGNOSIS — Z7984 Long term (current) use of oral hypoglycemic drugs: Secondary | ICD-10-CM | POA: Insufficient documentation

## 2020-09-08 DIAGNOSIS — Z87891 Personal history of nicotine dependence: Secondary | ICD-10-CM | POA: Diagnosis not present

## 2020-09-08 DIAGNOSIS — E782 Mixed hyperlipidemia: Secondary | ICD-10-CM | POA: Diagnosis not present

## 2020-09-08 DIAGNOSIS — E119 Type 2 diabetes mellitus without complications: Secondary | ICD-10-CM | POA: Insufficient documentation

## 2020-09-08 DIAGNOSIS — E559 Vitamin D deficiency, unspecified: Secondary | ICD-10-CM | POA: Insufficient documentation

## 2020-09-08 DIAGNOSIS — I10 Essential (primary) hypertension: Secondary | ICD-10-CM | POA: Insufficient documentation

## 2020-09-08 DIAGNOSIS — Z833 Family history of diabetes mellitus: Secondary | ICD-10-CM | POA: Insufficient documentation

## 2020-09-08 DIAGNOSIS — Z8673 Personal history of transient ischemic attack (TIA), and cerebral infarction without residual deficits: Secondary | ICD-10-CM | POA: Insufficient documentation

## 2020-09-08 DIAGNOSIS — Z8249 Family history of ischemic heart disease and other diseases of the circulatory system: Secondary | ICD-10-CM | POA: Insufficient documentation

## 2020-09-08 DIAGNOSIS — Z79899 Other long term (current) drug therapy: Secondary | ICD-10-CM | POA: Insufficient documentation

## 2020-09-08 DIAGNOSIS — G4733 Obstructive sleep apnea (adult) (pediatric): Secondary | ICD-10-CM | POA: Diagnosis not present

## 2020-09-08 DIAGNOSIS — K219 Gastro-esophageal reflux disease without esophagitis: Secondary | ICD-10-CM | POA: Insufficient documentation

## 2020-09-08 LAB — CBC WITH DIFFERENTIAL/PLATELET
Abs Immature Granulocytes: 0.02 10*3/uL (ref 0.00–0.07)
Basophils Absolute: 0.1 10*3/uL (ref 0.0–0.1)
Basophils Relative: 1 %
Eosinophils Absolute: 0.3 10*3/uL (ref 0.0–0.5)
Eosinophils Relative: 6 %
HCT: 44.8 % (ref 39.0–52.0)
Hemoglobin: 13.4 g/dL (ref 13.0–17.0)
Immature Granulocytes: 0 %
Lymphocytes Relative: 30 %
Lymphs Abs: 1.8 10*3/uL (ref 0.7–4.0)
MCH: 28.1 pg (ref 26.0–34.0)
MCHC: 29.9 g/dL — ABNORMAL LOW (ref 30.0–36.0)
MCV: 93.9 fL (ref 80.0–100.0)
Monocytes Absolute: 0.5 10*3/uL (ref 0.1–1.0)
Monocytes Relative: 8 %
Neutro Abs: 3.2 10*3/uL (ref 1.7–7.7)
Neutrophils Relative %: 55 %
Platelets: 187 10*3/uL (ref 150–400)
RBC: 4.77 MIL/uL (ref 4.22–5.81)
RDW: 13.2 % (ref 11.5–15.5)
WBC: 5.8 10*3/uL (ref 4.0–10.5)
nRBC: 0 % (ref 0.0–0.2)

## 2020-09-08 LAB — COMPREHENSIVE METABOLIC PANEL
ALT: 22 U/L (ref 0–44)
AST: 22 U/L (ref 15–41)
Albumin: 4.1 g/dL (ref 3.5–5.0)
Alkaline Phosphatase: 58 U/L (ref 38–126)
Anion gap: 6 (ref 5–15)
BUN: 15 mg/dL (ref 8–23)
CO2: 26 mmol/L (ref 22–32)
Calcium: 8.8 mg/dL — ABNORMAL LOW (ref 8.9–10.3)
Chloride: 105 mmol/L (ref 98–111)
Creatinine, Ser: 1.35 mg/dL — ABNORMAL HIGH (ref 0.61–1.24)
GFR, Estimated: 56 mL/min — ABNORMAL LOW (ref >60)
Glucose, Bld: 109 mg/dL — ABNORMAL HIGH (ref 70–99)
Potassium: 4.5 mmol/L (ref 3.5–5.1)
Sodium: 137 mmol/L (ref 135–145)
Total Bilirubin: 0.7 mg/dL (ref 0.3–1.2)
Total Protein: 7.7 g/dL (ref 6.5–8.1)

## 2020-09-08 LAB — FERRITIN: Ferritin: 24 ng/mL (ref 24–336)

## 2020-09-08 LAB — VITAMIN B12: Vitamin B-12: 570 pg/mL (ref 180–914)

## 2020-09-08 LAB — IRON AND TIBC
Iron: 51 ug/dL (ref 45–182)
Saturation Ratios: 13 % — ABNORMAL LOW (ref 17.9–39.5)
TIBC: 392 ug/dL (ref 250–450)
UIBC: 341 ug/dL

## 2020-09-08 LAB — LACTATE DEHYDROGENASE: LDH: 162 U/L (ref 98–192)

## 2020-09-08 LAB — VITAMIN D 25 HYDROXY (VIT D DEFICIENCY, FRACTURES): Vit D, 25-Hydroxy: 45.26 ng/mL (ref 30–100)

## 2020-09-08 NOTE — Progress Notes (Signed)
Dennis Yu, [place] [address]   CLINIC:  Medical Oncology/Hematology  PCP:  [name], MD [address] [place] [address] [phone]   REASON FOR VISIT: Follow-up for iron deficiency anemia   CURRENT THERAPY: Oral iron therapy   INTERVAL HISTORY:   Mr. Crickenberger 75 y.o. male returns for routine follow-up for iron deficiency anemia.   He is here for follow-up by himself.  He can continues to report some fatigue but no other complaints.  He tells me that he has had a colonoscopy since his last visit here and after colonoscopy he had 2 hospitalizations and he is not happy with how the procedure went.  He also has not been taking any iron at this time.  He tells me makes him sick but when asked to elaborate on what are the side effects, he could not really name any.  He is however not willing to try it anymore at this time.  He denies any active hematochezia or melena.  He denies any hematuria.  He denies any change in breathing, no other bleeding.  Rest of the pertinent 10 point ROS reviewed and negative.  REVIEW OF SYSTEMS:  Review of Systems  Constitutional: Positive for fatigue.  All other systems reviewed and are negative.    PAST MEDICAL/SURGICAL HISTORY:  Past Medical History:  Diagnosis Date  . Bronchitis 11/2012  . Cardiomyopathy    LVEF 25%  . Erectile dysfunction   . Essential hypertension   . GERD (gastroesophageal reflux disease)   . History of colonic polyps   . Mixed hyperlipidemia   . 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 Obstructive sleep apnea   . Patent foramen ovale   . Phlebitis   . 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 Superficial phlebitis 12/10/2012  . Type 2 diabetes mellitus ([place])   . Vitamin D deficiency    Past Surgical History:  Procedure Laterality Date  . BIOPSY  01/11/2020   Procedure: BIOPSY;  Surgeon: Daneil Dolin, MD;  [facility];  Service: Endoscopy;;  .  COLONOSCOPY WITH PROPOFOL N/A 01/11/2020   Procedure: COLONOSCOPY WITH PROPOFOL;  Surgeon: Daneil Dolin, MD;  [facility];  Service: Endoscopy;  Laterality: N/A;  10:15am  . NO PAST SURGERIES    . POLYPECTOMY  01/11/2020   Procedure: POLYPECTOMY;  Surgeon: Daneil Dolin, MD;  [facility];  Service: Endoscopy;;     SOCIAL HISTORY:  Social History   Socioeconomic History  . Marital status: Married    Spouse name: Not on file  . Number of children: Not on file  . Years of education: Not on file  . Highest education level: Not on file  Occupational History  . Occupation: Truck Education administrator: RETIRED  Tobacco Use  . Smoking status: Former Smoker    Types: Cigarettes    Start date: 08/06/1967    Quit date: 08/06/2003    Years since quitting: 17.1  . Smokeless tobacco: Never Used  Vaping Use  . Vaping Use: Never used  Substance and Sexual Activity  . Alcohol use: No    Alcohol/week: 0.0 standard drinks  . Drug use: No  . Sexual activity: Not Currently  Other Topics Concern  . Not on file  Social History Narrative  . Not on file   Social Determinants of Health   Financial Resource Strain:  Not on file  Food Insecurity: Not on file  Transportation Needs: Not on file  Physical Activity: Not on file  Stress: Not on file  Social Connections: Not on file  Intimate Partner Violence: Not At Risk  . Fear of Current or Ex-Partner: No  . Emotionally Abused: No  . Physically Abused: No  . Sexually Abused: No    FAMILY HISTORY:  Family History  Problem Relation Age of Onset  . Diabetes Father   . Hypertension Other   . Colon cancer Neg Hx     CURRENT MEDICATIONS:  Outpatient Encounter Medications as of 09/08/2020  Medication Sig  . aspirin 81 MG tablet Take 81 mg by mouth daily.  . carvedilol (COREG) 6.25 MG tablet Take 6.25 mg by mouth 2 (two) times daily with a meal.  . dicyclomine (BENTYL) 20 MG tablet Take 1 tablet (20 mg total) by mouth 2  (two) times daily as needed for spasms.  [name] gabapentin (NEURONTIN) 300 MG capsule Take 3 capsules (900 mg total) by mouth daily. (Patient taking differently: Take 300-600 mg by mouth See admin instructions. 300 mg during the day, 600 mg at bedtime)  . glipiZIDE (GLUCOTROL) 5 MG tablet Take 5 mg by mouth daily.   . NON FORMULARY legatrin pm Daily  . olmesartan (BENICAR) 20 MG tablet Take 20 mg by mouth daily.  . Omega-3 Fatty Acids (FISH OIL) 1000 MG CAPS Take 1,000 mg by mouth daily.   . pantoprazole (PROTONIX) 40 MG tablet Take 1 tablet (40 mg total) by mouth daily. (Patient taking differently: Take 40 mg by mouth 2 (two) times daily before a meal.)  . pantoprazole (PROTONIX) 40 MG tablet Take 1 tablet (40 mg total) by mouth 2 (two) times daily before a meal.  . PROAIR HFA 108 (90 BASE) MCG/ACT inhaler Inhale 2 puffs into the lungs every 4 (four) hours as needed for wheezing or shortness of breath.   . sacubitril-valsartan (ENTRESTO) 24-26 MG Take by mouth.  . simvastatin (ZOCOR) 40 MG tablet Take 40 mg by mouth daily at 6 PM.   . warfarin (JANTOVEN) 5 MG tablet TAKE 1 TABLET DAILY EXCEPT 2 TABLETS ON  FRIDAYS  . furosemide (LASIX) 40 MG tablet Take 1.5 tablets (60 mg total) by mouth daily.  . [DISCONTINUED] sacubitril-valsartan (ENTRESTO) 24-26 MG Take 1 tablet by mouth 2 (two) times daily.   No facility-administered encounter medications on file as of 09/08/2020.    ALLERGIES:  No Known Allergies   PHYSICAL EXAM:  ECOG Performance status: 1  Vitals:   09/08/20 1128  BP: 130/79  Pulse: 68  Resp: 17  Temp: 98.8 F (37.1 C)  SpO2: 97%   Filed Weights   09/08/20 1128  Weight: 208 lb 7 oz (94.5 kg)   Physical Exam Constitutional:      Appearance: Normal appearance. He is normal weight.  Cardiovascular:     Rate and Rhythm: Normal rate and regular rhythm.     Heart sounds: Normal heart sounds.  Pulmonary:     Effort: Pulmonary effort is normal.     Breath sounds: Normal  breath sounds.  Abdominal:     General: Bowel sounds are normal.     Palpations: Abdomen is soft.  Musculoskeletal:        General: Normal range of motion.  Skin:    General: Skin is warm.  Neurological:     Mental Status: He is alert. Mental status is at baseline.  Psychiatric:  Mood and Affect: Mood normal.        Behavior: Behavior normal.        Thought Content: Thought content normal.        Judgment: Judgment normal.      LABORATORY DATA:  I have reviewed the labs as listed.  CBC    Component Value Date/Time   WBC 5.8 09/08/2020 1000   RBC 4.77 09/08/2020 1000   HGB 13.4 09/08/2020 1000   HCT 44.8 09/08/2020 1000   PLT 187 09/08/2020 1000   MCV 93.9 09/08/2020 1000   MCH 28.1 09/08/2020 1000   MCHC 29.9 (L) 09/08/2020 1000   RDW 13.2 09/08/2020 1000   LYMPHSABS 1.8 09/08/2020 1000   MONOABS 0.5 09/08/2020 1000   EOSABS 0.3 09/08/2020 1000   BASOSABS 0.1 09/08/2020 1000   CMP Latest Ref Rng & Units 09/08/2020 06/12/2020 05/31/2020  Glucose 70 - 99 mg/dL 109(H) 143(H) 252(H)  BUN 8 - 23 mg/dL 15 28(H) 28(H)  Creatinine 0.61 - 1.24 mg/dL 1.35(H) 1.64(H) 2.21(H)  Sodium 135 - 145 mmol/L 137 137 138  Potassium 3.5 - 5.1 mmol/L 4.5 4.7 4.9  Chloride 98 - 111 mmol/L 105 105 104  CO2 22 - 32 mmol/L 26 24 25   Calcium 8.9 - 10.3 mg/dL 8.8(L) 9.2 8.9  Total Protein 6.5 - 8.1 g/dL 7.7 - -  Total Bilirubin 0.3 - 1.2 mg/dL 0.7 - -  Alkaline Phos 38 - 126 U/L 58 - -  AST 15 - 41 U/L 22 - -  ALT 0 - 44 U/L 22 - -    All questions were answered to patient's stated satisfaction. Encouraged patient to call with any new concerns or questions before his next visit to the cancer center and we can certain see him sooner, if needed.     ASSESSMENT & PLAN:  No problem-specific Assessment & Plan notes found for this encounter. .  Iron deficiency anemia:  -Exact cause unknown, likely some component from malabsorption and some blood loss, possibly in GI tract since he is on  anticoagulation.  He most recently had colonoscopy which did not show any evidence of malignancy but was noted to have some diverticuli and polyp which showed sessile serrated polyp without cytologic dysplasia. I reviewed his labs with him, hemoglobin appears to be better however the ferritin continues to decline.   I have suggested trying oral iron again versus consideration for intravenous iron versus follow-up with repeat labs in 8 weeks.  He was not able to make a decision but he is sure that he does not want to try oral iron.  He appears to be leaning towards follow-up since he wants to do the least possible interventions at this time.  I think this is a reasonable approach given his normal hemoglobin but I have suggested that he may need some form of iron in the future.   2.  Chronic kidney disease: Improved  3.  Diarrhea: Resolved.   Orders placed this encounter:  No orders of the defined types were placed in this encounter.   I spent a total of 30 minutes in the care of this patient discussing about how to treat iron deficiency anemia including possible side effects from IV iron, review of records, coordination of care.

## 2020-09-22 DIAGNOSIS — B351 Tinea unguium: Secondary | ICD-10-CM | POA: Diagnosis not present

## 2020-09-22 DIAGNOSIS — E1142 Type 2 diabetes mellitus with diabetic polyneuropathy: Secondary | ICD-10-CM | POA: Diagnosis not present

## 2020-10-02 DIAGNOSIS — E782 Mixed hyperlipidemia: Secondary | ICD-10-CM | POA: Diagnosis not present

## 2020-10-02 DIAGNOSIS — I10 Essential (primary) hypertension: Secondary | ICD-10-CM | POA: Diagnosis not present

## 2020-10-02 DIAGNOSIS — I5032 Chronic diastolic (congestive) heart failure: Secondary | ICD-10-CM | POA: Diagnosis not present

## 2020-10-02 DIAGNOSIS — E1122 Type 2 diabetes mellitus with diabetic chronic kidney disease: Secondary | ICD-10-CM | POA: Diagnosis not present

## 2020-10-05 ENCOUNTER — Encounter: Payer: Self-pay | Admitting: *Deleted

## 2020-10-05 NOTE — Progress Notes (Signed)
Per [employer] PAF-approved to receive entresto 24/26 mg through 08/04/2021

## 2020-10-08 ENCOUNTER — Other Ambulatory Visit: Payer: Self-pay | Admitting: Nurse Practitioner

## 2020-10-08 DIAGNOSIS — K265 Chronic or unspecified duodenal ulcer with perforation: Secondary | ICD-10-CM

## 2020-10-16 ENCOUNTER — Telehealth: Payer: Self-pay

## 2020-10-16 MED ORDER — CARVEDILOL 6.25 MG PO TABS
6.2500 mg | ORAL_TABLET | Freq: Two times a day (BID) | ORAL | 3 refills | Status: DC
Start: 2020-10-16 — End: 2021-08-13

## 2020-10-16 NOTE — Telephone Encounter (Signed)
Medication refill request approved and sent to [facility]. Carvedilol 12.5 mg tablets

## 2020-10-17 ENCOUNTER — Ambulatory Visit (INDEPENDENT_AMBULATORY_CARE_PROVIDER_SITE_OTHER): Payer: Medicare Other | Admitting: *Deleted

## 2020-10-17 DIAGNOSIS — Z86718 Personal history of other venous thrombosis and embolism: Secondary | ICD-10-CM

## 2020-10-17 DIAGNOSIS — I639 Cerebral infarction, unspecified: Secondary | ICD-10-CM

## 2020-10-17 DIAGNOSIS — Z7901 Long term (current) use of anticoagulants: Secondary | ICD-10-CM

## 2020-10-17 DIAGNOSIS — I6350 Cerebral infarction due to unspecified occlusion or stenosis of unspecified cerebral artery: Secondary | ICD-10-CM

## 2020-10-17 DIAGNOSIS — Z5181 Encounter for therapeutic drug level monitoring: Secondary | ICD-10-CM | POA: Diagnosis not present

## 2020-10-17 LAB — POCT INR: INR: 2.1 (ref 2.0–3.0)

## 2020-10-17 NOTE — Patient Instructions (Signed)
Continue warfarin 1 tablet daily  Recheck in 6 weeks.  

## 2020-11-01 DIAGNOSIS — E782 Mixed hyperlipidemia: Secondary | ICD-10-CM | POA: Diagnosis not present

## 2020-11-01 DIAGNOSIS — I130 Hypertensive heart and chronic kidney disease with heart failure and stage 1 through stage 4 chronic kidney disease, or unspecified chronic kidney disease: Secondary | ICD-10-CM | POA: Diagnosis not present

## 2020-11-01 DIAGNOSIS — N189 Chronic kidney disease, unspecified: Secondary | ICD-10-CM | POA: Diagnosis not present

## 2020-11-01 DIAGNOSIS — I10 Essential (primary) hypertension: Secondary | ICD-10-CM | POA: Diagnosis not present

## 2020-11-01 DIAGNOSIS — E1122 Type 2 diabetes mellitus with diabetic chronic kidney disease: Secondary | ICD-10-CM | POA: Diagnosis not present

## 2020-11-01 DIAGNOSIS — I5032 Chronic diastolic (congestive) heart failure: Secondary | ICD-10-CM | POA: Diagnosis not present

## 2020-11-07 ENCOUNTER — Other Ambulatory Visit (HOSPITAL_COMMUNITY): Payer: Self-pay

## 2020-11-08 ENCOUNTER — Inpatient Hospital Stay (HOSPITAL_COMMUNITY): Payer: Medicare Other | Attending: Hematology

## 2020-11-08 ENCOUNTER — Other Ambulatory Visit: Payer: Self-pay

## 2020-11-08 DIAGNOSIS — R0602 Shortness of breath: Secondary | ICD-10-CM | POA: Diagnosis not present

## 2020-11-08 DIAGNOSIS — N189 Chronic kidney disease, unspecified: Secondary | ICD-10-CM | POA: Diagnosis not present

## 2020-11-08 DIAGNOSIS — Z8249 Family history of ischemic heart disease and other diseases of the circulatory system: Secondary | ICD-10-CM | POA: Diagnosis not present

## 2020-11-08 DIAGNOSIS — Z79899 Other long term (current) drug therapy: Secondary | ICD-10-CM | POA: Insufficient documentation

## 2020-11-08 DIAGNOSIS — R5383 Other fatigue: Secondary | ICD-10-CM | POA: Insufficient documentation

## 2020-11-08 DIAGNOSIS — K5730 Diverticulosis of large intestine without perforation or abscess without bleeding: Secondary | ICD-10-CM | POA: Diagnosis not present

## 2020-11-08 DIAGNOSIS — E1122 Type 2 diabetes mellitus with diabetic chronic kidney disease: Secondary | ICD-10-CM | POA: Insufficient documentation

## 2020-11-08 DIAGNOSIS — K648 Other hemorrhoids: Secondary | ICD-10-CM | POA: Insufficient documentation

## 2020-11-08 DIAGNOSIS — Z833 Family history of diabetes mellitus: Secondary | ICD-10-CM | POA: Insufficient documentation

## 2020-11-08 DIAGNOSIS — Z7901 Long term (current) use of anticoagulants: Secondary | ICD-10-CM | POA: Diagnosis not present

## 2020-11-08 DIAGNOSIS — D509 Iron deficiency anemia, unspecified: Secondary | ICD-10-CM | POA: Insufficient documentation

## 2020-11-08 DIAGNOSIS — K635 Polyp of colon: Secondary | ICD-10-CM | POA: Insufficient documentation

## 2020-11-08 DIAGNOSIS — Z8673 Personal history of transient ischemic attack (TIA), and cerebral infarction without residual deficits: Secondary | ICD-10-CM | POA: Insufficient documentation

## 2020-11-08 DIAGNOSIS — Z8719 Personal history of other diseases of the digestive system: Secondary | ICD-10-CM | POA: Insufficient documentation

## 2020-11-08 DIAGNOSIS — Z87891 Personal history of nicotine dependence: Secondary | ICD-10-CM | POA: Insufficient documentation

## 2020-11-08 LAB — CBC WITH DIFFERENTIAL/PLATELET
Abs Immature Granulocytes: 0.01 10*3/uL (ref 0.00–0.07)
Basophils Absolute: 0.1 10*3/uL (ref 0.0–0.1)
Basophils Relative: 1 %
Eosinophils Absolute: 0.3 10*3/uL (ref 0.0–0.5)
Eosinophils Relative: 5 %
HCT: 44.1 % (ref 39.0–52.0)
Hemoglobin: 13.5 g/dL (ref 13.0–17.0)
Immature Granulocytes: 0 %
Lymphocytes Relative: 27 %
Lymphs Abs: 1.6 10*3/uL (ref 0.7–4.0)
MCH: 28.2 pg (ref 26.0–34.0)
MCHC: 30.6 g/dL (ref 30.0–36.0)
MCV: 92.1 fL (ref 80.0–100.0)
Monocytes Absolute: 0.6 10*3/uL (ref 0.1–1.0)
Monocytes Relative: 9 %
Neutro Abs: 3.4 10*3/uL (ref 1.7–7.7)
Neutrophils Relative %: 58 %
Platelets: 208 10*3/uL (ref 150–400)
RBC: 4.79 MIL/uL (ref 4.22–5.81)
RDW: 12.9 % (ref 11.5–15.5)
WBC: 5.9 10*3/uL (ref 4.0–10.5)
nRBC: 0 % (ref 0.0–0.2)

## 2020-11-08 LAB — FERRITIN: Ferritin: 64 ng/mL (ref 24–336)

## 2020-11-08 LAB — IRON AND TIBC
Iron: 57 ug/dL (ref 45–182)
Saturation Ratios: 16 % — ABNORMAL LOW (ref 17.9–39.5)
TIBC: 347 ug/dL (ref 250–450)
UIBC: 290 ug/dL

## 2020-11-13 ENCOUNTER — Encounter: Payer: Self-pay | Admitting: Internal Medicine

## 2020-11-15 ENCOUNTER — Inpatient Hospital Stay (HOSPITAL_BASED_OUTPATIENT_CLINIC_OR_DEPARTMENT_OTHER): Payer: Medicare Other | Admitting: Hematology

## 2020-11-15 VITALS — BP 125/77 | HR 61 | Temp 96.7°F | Resp 18 | Wt 198.0 lb

## 2020-11-15 DIAGNOSIS — D509 Iron deficiency anemia, unspecified: Secondary | ICD-10-CM | POA: Diagnosis not present

## 2020-11-15 DIAGNOSIS — N189 Chronic kidney disease, unspecified: Secondary | ICD-10-CM | POA: Diagnosis not present

## 2020-11-15 DIAGNOSIS — E1122 Type 2 diabetes mellitus with diabetic chronic kidney disease: Secondary | ICD-10-CM | POA: Diagnosis not present

## 2020-11-15 DIAGNOSIS — K5730 Diverticulosis of large intestine without perforation or abscess without bleeding: Secondary | ICD-10-CM | POA: Diagnosis not present

## 2020-11-15 DIAGNOSIS — D508 Other iron deficiency anemias: Secondary | ICD-10-CM | POA: Diagnosis not present

## 2020-11-15 DIAGNOSIS — R5383 Other fatigue: Secondary | ICD-10-CM | POA: Diagnosis not present

## 2020-11-15 DIAGNOSIS — R0602 Shortness of breath: Secondary | ICD-10-CM | POA: Diagnosis not present

## 2020-11-15 NOTE — Progress Notes (Signed)
[place], [place] [address]   CLINIC:  Medical Oncology/Hematology  PCP:  [name], MD [address] [place] [address]  [phone]  REASON FOR VISIT:  Follow-up for IDA  PRIOR THERAPY: None  CURRENT THERAPY: Iron tablet daily  INTERVAL HISTORY:  [name], a 75 y.o. male, returns for routine follow-up for his IDA. Dennis Yu was last seen by Dr. Arletha Pili Iruku on 09/08/2020.  Today he reports feeling well. He is taking 1 iron tablet daily in the evening after dinner and he denies having constipation, abdominal pain or leg swelling. He also reports having watery diarrhea 2-3 times less than daily which slightly improved compared to taking iron tablets in the morning. He is also taking Centrum Silver daily.   REVIEW OF SYSTEMS:  Review of Systems  Constitutional: Positive for fatigue (50%). Negative for appetite change.  Respiratory: Positive for shortness of breath.   Cardiovascular: Negative for leg swelling.  Gastrointestinal: Positive for diarrhea (2-3 watery episodes less than daily). Negative for abdominal pain and constipation.  All other systems reviewed and are negative.   PAST MEDICAL/SURGICAL HISTORY:  Past Medical History:  Diagnosis Date  . Bronchitis 11/2012  . Cardiomyopathy    LVEF 25%  . Erectile dysfunction   . Essential hypertension   . GERD (gastroesophageal reflux disease)   . History of colonic polyps   . Mixed hyperlipidemia   . 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 Obstructive sleep apnea   . Patent foramen ovale   . Phlebitis   . 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 Superficial phlebitis 12/10/2012  . Type 2 diabetes mellitus ([place])   . Vitamin D deficiency    Past Surgical History:  Procedure Laterality Date  . BIOPSY  01/11/2020   Procedure: BIOPSY;  Surgeon: Daneil Dolin, MD;  [facility];  Service: Endoscopy;;  . COLONOSCOPY WITH PROPOFOL N/A  01/11/2020   Procedure: COLONOSCOPY WITH PROPOFOL;  Surgeon: Daneil Dolin, MD;  [facility];  Service: Endoscopy;  Laterality: N/A;  10:15am  . NO PAST SURGERIES    . POLYPECTOMY  01/11/2020   Procedure: POLYPECTOMY;  Surgeon: Daneil Dolin, MD;  [facility];  Service: Endoscopy;;    SOCIAL HISTORY:  Social History   Socioeconomic History  . Marital status: Married    Spouse name: Not on file  . Number of children: Not on file  . Years of education: Not on file  . Highest education level: Not on file  Occupational History  . Occupation: Truck Education administrator: RETIRED  Tobacco Use  . Smoking status: Former Smoker    Types: Cigarettes    Start date: 08/06/1967    Quit date: 08/06/2003    Years since quitting: 17.2  . Smokeless tobacco: Never Used  Vaping Use  . Vaping Use: Never used  Substance and Sexual Activity  . Alcohol use: No    Alcohol/week: 0.0 standard drinks  . Drug use: No  . Sexual activity: Not Currently  Other Topics Concern  . Not on file  Social History Narrative  . Not on file   Social Determinants of Health   Financial Resource Strain: Not on file  Food Insecurity: Not on file  Transportation Needs: Not on file  Physical Activity: Not on file  Stress: Not on file  Social Connections: Not on  file  Intimate Partner Violence: Not At Risk  . Fear of Current or Ex-Partner: No  . Emotionally Abused: No  . Physically Abused: No  . Sexually Abused: No    FAMILY HISTORY:  Family History  Problem Relation Age of Onset  . Diabetes Father   . Hypertension Other   . Colon cancer Neg Hx     CURRENT MEDICATIONS:  Current Outpatient Medications  Medication Sig Dispense Refill  . aspirin 81 MG tablet Take 81 mg by mouth daily.    . carvedilol (COREG) 6.25 MG tablet Take 1 tablet (6.25 mg total) by mouth 2 (two) times daily with a meal. 180 tablet 3  . dicyclomine (BENTYL) 20 MG tablet Take 1 tablet (20 mg total) by mouth 2  (two) times daily as needed for spasms. 10 tablet 0  . gabapentin (NEURONTIN) 300 MG capsule Take 3 capsules (900 mg total) by mouth daily. (Patient taking differently: Take 300-600 mg by mouth See admin instructions. 300 mg during the day, 600 mg at bedtime) 90 capsule 11  . glipiZIDE (GLUCOTROL) 5 MG tablet Take 5 mg by mouth daily.     . NON FORMULARY legatrin pm Daily    . olmesartan (BENICAR) 20 MG tablet Take 20 mg by mouth daily.    . Omega-3 Fatty Acids (FISH OIL) 1000 MG CAPS Take 1,000 mg by mouth daily.     . pantoprazole (PROTONIX) 40 MG tablet Take 1 tablet (40 mg total) by mouth daily. (Patient taking differently: Take 40 mg by mouth 2 (two) times daily before a meal.) 30 tablet 0  . pantoprazole (PROTONIX) 40 MG tablet TAKE 1 TABLET TWICE DAILY  BEFORE MEALS 180 tablet 1  . PROAIR HFA 108 (90 BASE) MCG/ACT inhaler Inhale 2 puffs into the lungs every 4 (four) hours as needed for wheezing or shortness of breath.     . sacubitril-valsartan (ENTRESTO) 24-26 MG Take by mouth.    . simvastatin (ZOCOR) 40 MG tablet Take 40 mg by mouth daily at 6 PM.     . warfarin (JANTOVEN) 5 MG tablet TAKE 1 TABLET DAILY EXCEPT 2 TABLETS ON  FRIDAYS 100 tablet 3  . furosemide (LASIX) 40 MG tablet Take 1.5 tablets (60 mg total) by mouth daily. 135 tablet 3   No current facility-administered medications for this visit.    ALLERGIES:  No Known Allergies  PHYSICAL EXAM:  Performance status (ECOG): 1 - Symptomatic but completely ambulatory  Vitals:   11/15/20 0754  BP: 125/77  Pulse: 61  Resp: 18  Temp: (!) 96.7 F (35.9 C)  SpO2: 93%   Wt Readings from Last 3 Encounters:  11/15/20 198 lb (89.8 kg)  09/08/20 208 lb 7 oz (94.5 kg)  08/31/20 205 lb (93 kg)   Physical Exam Vitals reviewed.  Constitutional:      Appearance: Normal appearance.  Cardiovascular:     Rate and Rhythm: Normal rate and regular rhythm.     Pulses: Normal pulses.     Heart sounds: Normal heart sounds.   Pulmonary:     Effort: Pulmonary effort is normal.     Breath sounds: Normal breath sounds.  Neurological:     General: No focal deficit present.     Mental Status: He is alert and oriented to person, place, and time.  Psychiatric:        Mood and Affect: Mood normal.        Behavior: Behavior normal.     LABORATORY DATA:  I  have reviewed the labs as listed.  CBC Latest Ref Rng & Units 11/08/2020 09/08/2020 03/29/2020  WBC 4.0 - 10.5 K/uL 5.9 5.8 11.7(H)  Hemoglobin 13.0 - 17.0 g/dL 13.5 13.4 11.0(L)  Hematocrit 39.0 - 52.0 % 44.1 44.8 36.3(L)  Platelets 150 - 400 K/uL 208 187 408(H)   CMP Latest Ref Rng & Units 09/08/2020 06/12/2020 05/31/2020  Glucose 70 - 99 mg/dL 109(H) 143(H) 252(H)  BUN 8 - 23 mg/dL 15 28(H) 28(H)  Creatinine 0.61 - 1.24 mg/dL 1.35(H) 1.64(H) 2.21(H)  Sodium 135 - 145 mmol/L 137 137 138  Potassium 3.5 - 5.1 mmol/L 4.5 4.7 4.9  Chloride 98 - 111 mmol/L 105 105 104  CO2 22 - 32 mmol/L 26 24 25   Calcium 8.9 - 10.3 mg/dL 8.8(L) 9.2 8.9  Total Protein 6.5 - 8.1 g/dL 7.7 - -  Total Bilirubin 0.3 - 1.2 mg/dL 0.7 - -  Alkaline Phos 38 - 126 U/L 58 - -  AST 15 - 41 U/L 22 - -  ALT 0 - 44 U/L 22 - -      Component Value Date/Time   RBC 4.79 11/08/2020 0813   MCV 92.1 11/08/2020 0813   MCH 28.2 11/08/2020 0813   MCHC 30.6 11/08/2020 0813   RDW 12.9 11/08/2020 0813   LYMPHSABS 1.6 11/08/2020 0813   MONOABS 0.6 11/08/2020 0813   EOSABS 0.3 11/08/2020 0813   BASOSABS 0.1 11/08/2020 0813   Lab Results  Component Value Date   TIBC 347 11/08/2020   TIBC 392 09/08/2020   TIBC 367 02/25/2020   FERRITIN 64 11/08/2020   FERRITIN 24 09/08/2020   FERRITIN 90 02/25/2020   IRONPCTSAT 16 (L) 11/08/2020   IRONPCTSAT 13 (L) 09/08/2020   IRONPCTSAT 19 02/25/2020   Lab Results  Component Value Date   VD25OH 45.26 09/08/2020   VD25OH 26.16 (L) 02/25/2020    DIAGNOSTIC IMAGING:  I have independently reviewed the scans and discussed with the patient. No results  found.   ASSESSMENT:  1.  Iron deficiency anemia: -Colonoscopy on 01/11/2020 with diverticulosis in the sigmoid colon and the descending colon.  5 mm polyp in the ascending colon resected.  Nonbleeding internal hemorrhoids. -He had episodes of diarrhea daily, 2-3 times within 2 hours after taking iron pill in the morning. -His diarrhea improved when he started taking the pill after dinner.  Now diarrhea is sporadic.   PLAN:  1.  Iron deficiency anemia: -He is taking iron tablet daily after his dinner.  Diarrhea slightly improved with this strategy. -Reviewed his labs from 11/08/2020.  Ferritin improved to 64 from 24 in February.  Hemoglobin also in the normal range of 13.5. -Continue iron tablet daily or 1 every other day. -RTC 4 months for follow-up.  2.  Chronic kidney disease: -His last creatinine is 1.35 on 09/08/2020.   Orders placed this encounter:  No orders of the defined types were placed in this encounter.    Derek Jack, MD [place] [phone]   I, Milinda Antis, am acting as a scribe for Dr. Sanda Linger.  I, Derek Jack MD, have reviewed the above documentation for accuracy and completeness, and I agree with the above.

## 2020-11-15 NOTE — Patient Instructions (Signed)
[place] at [facility] Discharge Instructions  You were seen today by Dr. Delton Coombes. He went over your recent results. Continue taking 1 iron tablet daily (or every other day if daily is not tolerable). Dr. Delton Coombes will see you back in 4 months for labs and follow up.   Thank you for choosing [place] at [facility] to provide your oncology and hematology care.  To afford each patient quality time with our provider, please arrive at least 15 minutes before your scheduled appointment time.   If you have a lab appointment with the [facility] please come in thru the Main Entrance and check in at the main information desk  You need to re-schedule your appointment should you arrive 10 or more minutes late.  We strive to give you quality time with our providers, and arriving late affects you and other patients whose appointments are after yours.  Also, if you no show three or more times for appointments you may be dismissed from the clinic at the providers discretion.     Again, thank you for choosing [facility].  Our hope is that these requests will decrease the amount of time that you wait before being seen by our physicians.       _____________________________________________________________  Should you have questions after your visit to [facility], please contact our office at [phone] between the hours of 8:00 a.m. and 4:30 p.m.  Voicemails left after 4:00 p.m. will not be returned until the following business day.  For prescription refill requests, have your pharmacy contact our office and allow 72 hours.    Cancer Center Support Programs:   > Cancer Support Group  2nd Tuesday of the month 1pm-2pm, Journey Room

## 2020-11-20 DIAGNOSIS — E782 Mixed hyperlipidemia: Secondary | ICD-10-CM | POA: Diagnosis not present

## 2020-11-20 DIAGNOSIS — R944 Abnormal results of kidney function studies: Secondary | ICD-10-CM | POA: Diagnosis not present

## 2020-11-20 DIAGNOSIS — N189 Chronic kidney disease, unspecified: Secondary | ICD-10-CM | POA: Diagnosis not present

## 2020-11-20 DIAGNOSIS — E0822 Diabetes mellitus due to underlying condition with diabetic chronic kidney disease: Secondary | ICD-10-CM | POA: Diagnosis not present

## 2020-11-20 DIAGNOSIS — I129 Hypertensive chronic kidney disease with stage 1 through stage 4 chronic kidney disease, or unspecified chronic kidney disease: Secondary | ICD-10-CM | POA: Diagnosis not present

## 2020-11-20 DIAGNOSIS — N183 Chronic kidney disease, stage 3 unspecified: Secondary | ICD-10-CM | POA: Diagnosis not present

## 2020-11-20 DIAGNOSIS — E7849 Other hyperlipidemia: Secondary | ICD-10-CM | POA: Diagnosis not present

## 2020-11-20 DIAGNOSIS — D509 Iron deficiency anemia, unspecified: Secondary | ICD-10-CM | POA: Diagnosis not present

## 2020-11-20 DIAGNOSIS — I10 Essential (primary) hypertension: Secondary | ICD-10-CM | POA: Diagnosis not present

## 2020-11-20 DIAGNOSIS — N1831 Chronic kidney disease, stage 3a: Secondary | ICD-10-CM | POA: Diagnosis not present

## 2020-11-20 DIAGNOSIS — I130 Hypertensive heart and chronic kidney disease with heart failure and stage 1 through stage 4 chronic kidney disease, or unspecified chronic kidney disease: Secondary | ICD-10-CM | POA: Diagnosis not present

## 2020-11-20 DIAGNOSIS — E119 Type 2 diabetes mellitus without complications: Secondary | ICD-10-CM | POA: Diagnosis not present

## 2020-11-23 DIAGNOSIS — E663 Overweight: Secondary | ICD-10-CM | POA: Diagnosis not present

## 2020-11-23 DIAGNOSIS — Z8673 Personal history of transient ischemic attack (TIA), and cerebral infarction without residual deficits: Secondary | ICD-10-CM | POA: Diagnosis not present

## 2020-11-23 DIAGNOSIS — Z6827 Body mass index (BMI) 27.0-27.9, adult: Secondary | ICD-10-CM | POA: Diagnosis not present

## 2020-11-23 DIAGNOSIS — E1122 Type 2 diabetes mellitus with diabetic chronic kidney disease: Secondary | ICD-10-CM | POA: Diagnosis not present

## 2020-11-23 DIAGNOSIS — I482 Chronic atrial fibrillation, unspecified: Secondary | ICD-10-CM | POA: Diagnosis not present

## 2020-11-23 DIAGNOSIS — G9009 Other idiopathic peripheral autonomic neuropathy: Secondary | ICD-10-CM | POA: Diagnosis not present

## 2020-11-23 DIAGNOSIS — G4733 Obstructive sleep apnea (adult) (pediatric): Secondary | ICD-10-CM | POA: Diagnosis not present

## 2020-11-23 DIAGNOSIS — D509 Iron deficiency anemia, unspecified: Secondary | ICD-10-CM | POA: Diagnosis not present

## 2020-11-23 DIAGNOSIS — N1831 Chronic kidney disease, stage 3a: Secondary | ICD-10-CM | POA: Diagnosis not present

## 2020-11-23 DIAGNOSIS — E782 Mixed hyperlipidemia: Secondary | ICD-10-CM | POA: Diagnosis not present

## 2020-11-23 DIAGNOSIS — I129 Hypertensive chronic kidney disease with stage 1 through stage 4 chronic kidney disease, or unspecified chronic kidney disease: Secondary | ICD-10-CM | POA: Diagnosis not present

## 2020-11-23 DIAGNOSIS — I5032 Chronic diastolic (congestive) heart failure: Secondary | ICD-10-CM | POA: Diagnosis not present

## 2020-11-28 ENCOUNTER — Ambulatory Visit (INDEPENDENT_AMBULATORY_CARE_PROVIDER_SITE_OTHER): Payer: Medicare Other | Admitting: *Deleted

## 2020-11-28 DIAGNOSIS — Z5181 Encounter for therapeutic drug level monitoring: Secondary | ICD-10-CM

## 2020-11-28 DIAGNOSIS — Z7901 Long term (current) use of anticoagulants: Secondary | ICD-10-CM

## 2020-11-28 DIAGNOSIS — I639 Cerebral infarction, unspecified: Secondary | ICD-10-CM

## 2020-11-28 DIAGNOSIS — Z86718 Personal history of other venous thrombosis and embolism: Secondary | ICD-10-CM | POA: Diagnosis not present

## 2020-11-28 DIAGNOSIS — I6350 Cerebral infarction due to unspecified occlusion or stenosis of unspecified cerebral artery: Secondary | ICD-10-CM

## 2020-11-28 LAB — POCT INR: INR: 2.3 (ref 2.0–3.0)

## 2020-11-28 NOTE — Patient Instructions (Signed)
Continue warfarin 1 tablet daily  Recheck in 6 weeks.  

## 2020-12-01 DIAGNOSIS — I129 Hypertensive chronic kidney disease with stage 1 through stage 4 chronic kidney disease, or unspecified chronic kidney disease: Secondary | ICD-10-CM | POA: Diagnosis not present

## 2020-12-01 DIAGNOSIS — E1142 Type 2 diabetes mellitus with diabetic polyneuropathy: Secondary | ICD-10-CM | POA: Diagnosis not present

## 2020-12-01 DIAGNOSIS — I5042 Chronic combined systolic (congestive) and diastolic (congestive) heart failure: Secondary | ICD-10-CM | POA: Diagnosis not present

## 2020-12-01 DIAGNOSIS — E1122 Type 2 diabetes mellitus with diabetic chronic kidney disease: Secondary | ICD-10-CM | POA: Diagnosis not present

## 2020-12-01 DIAGNOSIS — E870 Hyperosmolality and hypernatremia: Secondary | ICD-10-CM | POA: Diagnosis not present

## 2020-12-01 DIAGNOSIS — B351 Tinea unguium: Secondary | ICD-10-CM | POA: Diagnosis not present

## 2020-12-01 DIAGNOSIS — N189 Chronic kidney disease, unspecified: Secondary | ICD-10-CM | POA: Diagnosis not present

## 2020-12-06 NOTE — Progress Notes (Signed)
Cardiology Office Note  Date: 12/07/2020   ID: Dennis Yu 1946-01-15, MRN [id number]  PCP:  Dennis Yu  Cardiologist:  Dennis Yu Electrophysiologist:  None   Chief Complaint  Patient presents with  . Cardiac follow-up    History of Present Illness: Dennis Yu is a 75 y.o. male last seen in January.  He presents for a follow-up visit, reports stable weight, NYHA class II dyspnea.  No angina, no palpitations or syncope.  He has been doing some work in his garden.  He remains on Coumadin with follow-up in the anticoagulation clinic.  Recent INR was 2.3.  Reports no spontaneous bleeding problems.  He had a recent visit with Dr. Theador Yu in late April.  I reviewed the note. He just had recent lab work with Dr. [place] Yu at which point creatinine was 1.34, potassium 4.6.  Renal function has improved recently.  I reviewed his current regimen which is outlined below.  He states that he has been having some diarrhea that he thinks is due to one of his medications, has not been able to figure it out yet.  Past Medical History:  Diagnosis Date  . Bronchitis 11/2012  . Cardiomyopathy    LVEF 25%  . Erectile dysfunction   . Essential hypertension   . GERD (gastroesophageal reflux disease)   . History of colonic polyps   . Mixed hyperlipidemia   . 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 Obstructive sleep apnea   . Patent foramen ovale   . Phlebitis   . 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 Superficial phlebitis 12/10/2012  . Type 2 diabetes mellitus ([place])   . Vitamin D deficiency     Past Surgical History:  Procedure Laterality Date  . BIOPSY  01/11/2020   Procedure: BIOPSY;  Surgeon: Dennis Yu;  Location: [facility];  Service: Endoscopy;;  . COLONOSCOPY WITH PROPOFOL N/A 01/11/2020   Procedure: COLONOSCOPY WITH PROPOFOL;  Surgeon: Dennis Yu;  Location: [facility];  Service: Endoscopy;  Laterality: N/A;  10:15am  . NO  PAST SURGERIES    . POLYPECTOMY  01/11/2020   Procedure: POLYPECTOMY;  Surgeon: Dennis Yu;  Location: [facility];  Service: Endoscopy;;    Current Outpatient Medications  Medication Sig Dispense Refill  . aspirin 81 MG tablet Take 81 mg by mouth daily.    . carvedilol (COREG) 6.25 MG tablet Take 1 tablet (6.25 mg total) by mouth 2 (two) times daily with a meal. 180 tablet 3  . dicyclomine (BENTYL) 20 MG tablet Take 1 tablet (20 mg total) by mouth 2 (two) times daily as needed for spasms. 10 tablet 0  . furosemide (LASIX) 40 MG tablet Take 1.5 tablets (60 mg total) by mouth daily. 135 tablet 3  . gabapentin (NEURONTIN) 300 MG capsule Take 3 capsules (900 mg total) by mouth daily. (Patient taking differently: Take 300-600 mg by mouth See admin instructions. 300 mg during the day, 600 mg at bedtime) 90 capsule 11  . glipiZIDE (GLUCOTROL) 5 MG tablet Take 5 mg by mouth daily.     . NON FORMULARY legatrin pm Daily    . Omega-3 Fatty Acids (FISH OIL) 1000 MG CAPS Take 1,000 mg by mouth daily.     . pantoprazole (PROTONIX) 40 MG tablet Take 1 tablet (40 mg total) by mouth daily. 30 tablet 0  . PROAIR HFA 108 (90  BASE) MCG/ACT inhaler Inhale 2 puffs into the lungs every 4 (four) hours as needed for wheezing or shortness of breath.     . sacubitril-valsartan (ENTRESTO) 24-26 MG Take 1 tablet by mouth 2 (two) times daily.    . simvastatin (ZOCOR) 40 MG tablet Take 40 mg by mouth daily at 6 PM.     . warfarin (JANTOVEN) 5 MG tablet TAKE 1 TABLET DAILY EXCEPT 2 TABLETS ON  FRIDAYS 100 tablet 3   No current facility-administered medications for this visit.   Allergies:  Patient has no known allergies.   ROS: No orthopnea or PND.  Physical Exam: VS:  BP 126/80   Pulse 67   Ht 6' (1.829 m)   Wt 197 lb 12.8 oz (89.7 kg)   SpO2 97%   BMI 26.83 kg/m , BMI Body mass index is 26.83 kg/m.  Wt Readings from Last 3 Encounters:  12/07/20 197 lb 12.8 oz (89.7 kg)  11/15/20 198 lb (89.8 kg)   09/08/20 208 lb 7 oz (94.5 kg)    General: Patient appears comfortable at rest. HEENT: Conjunctiva and lids normal, wearing a mask. Neck: Supple, no elevated JVP or carotid bruits, no thyromegaly. Lungs: Clear to auscultation, nonlabored breathing at rest. Cardiac: Regular rate and rhythm, no S3, 1/6 systolic murmur, no pericardial rub. Extremities: No pitting edema.  ECG:  An ECG dated 03/29/2020 was personally reviewed today and demonstrated:  Sinus rhythm with old inferior infarct pattern, fusion beat.  Recent Labwork: 01/20/2020: Magnesium 2.0 09/08/2020: ALT 22; AST 22; BUN 15; Creatinine, Ser 1.35; Potassium 4.5; Sodium 137 11/08/2020: Hemoglobin 13.5; Platelets 208     Component Value Date/Time   CHOL 145 04/05/2011 0545   TRIG 143 01/17/2020 0600   HDL 40 04/05/2011 0545   CHOLHDL 3.6 04/05/2011 0545   VLDL 13 04/05/2011 0545   LDLCALC 92 04/05/2011 0545  April 2022: Hemoglobin 14.3, platelets 240, BUN 23, creatinine 1.34, potassium 4.6, AST 20, ALT 23, cholesterol 141, HDL 28, LDL 87, iron 66, hemoglobin A1c 7.5%  Other Studies Reviewed Today:  Echocardiogram 02/23/2020: 1. Left ventricular ejection fraction, by estimation, is approximately  25%. The left ventricle has severely decreased function. The left  ventricle demonstrates regional wall motion abnormalities (see scoring  diagram/findings for description). The left  ventricular internal cavity size was mildly dilated. Left ventricular  diastolic parameters are indeterminate.  2. Calcified apical LV muralthrombus present. Also prominent apical  trabeculation.  3. Right ventricular systolic function is normal. The right ventricular  size is normal. Tricuspid regurgitation signal is inadequate for assessing  PA pressure.  4. Left atrial size was mildly dilated.  5. The mitral valve is abnormal, mild to moderate annular calcification  and restricted posterior leaflet motion. Moderate mitral valve  regurgitation.   6. The aortic valve is tricuspid, moderate annular calcification. Aortic  valve regurgitation is not visualized.  7. The inferior vena cava is normal in size with greater than 50%  respiratory variability, suggesting right atrial pressure of 3 mmHg.   Assessment and Plan:  1.  Ischemic cardiomyopathy with LVEF 25%, normal RV contraction.  He is clinically stable from the perspective of chronic combined heart failure, no change in weight, NYHA class II dyspnea.  He has preferred conservative management, have not pursued cardiac catheterization or ICD.  Currently on Coreg, Entresto, and Lasix.  Have not pursued Aldactone or SGLT2 inhibitor with fluctuating renal insufficiency, although renal function has improved more recently and one or both may ultimately be  options.  2.  CKD stage IIIb.  He follows with Dr. Theador Yu.  Recent creatinine down to 1.3.  3.  History of stroke and known calcified apical LV mural thrombus.  He continues on long-term anticoagulation with Coumadin, followed in the anticoagulation clinic.  Medication Adjustments/Labs and Tests Ordered: Current medicines are reviewed at length with the patient today.  Concerns regarding medicines are outlined above.   Tests Ordered: No orders of the defined types were placed in this encounter.   Medication Changes: No orders of the defined types were placed in this encounter.   Disposition:  Follow up 4 months.  Signed, Satira Sark, Yu, [facility] 12/07/2020 8:40 AM    [facility] at [facility] [address], [place], [place] [address] Phone: [phone]; Fax: [phone]

## 2020-12-07 ENCOUNTER — Other Ambulatory Visit: Payer: Self-pay

## 2020-12-07 ENCOUNTER — Ambulatory Visit (INDEPENDENT_AMBULATORY_CARE_PROVIDER_SITE_OTHER): Payer: Medicare Other | Admitting: Cardiology

## 2020-12-07 ENCOUNTER — Encounter: Payer: Self-pay | Admitting: Cardiology

## 2020-12-07 VITALS — BP 126/80 | HR 67 | Ht 72.0 in | Wt 197.8 lb

## 2020-12-07 DIAGNOSIS — I513 Intracardiac thrombosis, not elsewhere classified: Secondary | ICD-10-CM

## 2020-12-07 DIAGNOSIS — I6350 Cerebral infarction due to unspecified occlusion or stenosis of unspecified cerebral artery: Secondary | ICD-10-CM

## 2020-12-07 DIAGNOSIS — I255 Ischemic cardiomyopathy: Secondary | ICD-10-CM | POA: Diagnosis not present

## 2020-12-07 DIAGNOSIS — N1832 Chronic kidney disease, stage 3b: Secondary | ICD-10-CM | POA: Diagnosis not present

## 2020-12-07 NOTE — Patient Instructions (Signed)

## 2021-01-03 ENCOUNTER — Ambulatory Visit: Payer: Medicare Other | Admitting: Nurse Practitioner

## 2021-01-03 ENCOUNTER — Ambulatory Visit (INDEPENDENT_AMBULATORY_CARE_PROVIDER_SITE_OTHER): Payer: Medicare Other | Admitting: Gastroenterology

## 2021-01-03 ENCOUNTER — Encounter: Payer: Self-pay | Admitting: Gastroenterology

## 2021-01-03 VITALS — BP 110/61 | HR 58 | Temp 97.3°F | Ht 72.0 in | Wt 198.4 lb

## 2021-01-03 DIAGNOSIS — I6350 Cerebral infarction due to unspecified occlusion or stenosis of unspecified cerebral artery: Secondary | ICD-10-CM

## 2021-01-03 DIAGNOSIS — K265 Chronic or unspecified duodenal ulcer with perforation: Secondary | ICD-10-CM

## 2021-01-03 NOTE — Progress Notes (Signed)
Cc'ed to pcp °

## 2021-01-03 NOTE — Patient Instructions (Addendum)
1. Continue pantoprazole, try cutting back to once a day before breakfast.  If you have any recurrent abdominal pain, heartburn, reflux, you can go back to twice a day before a meal. 2. Call if you have recurrent diarrhea. 3. Continue to follow with Dr. Delton Coombes regarding history of anemia.   4. Return to our office in 6 months or call sooner if needed.

## 2021-01-03 NOTE — Progress Notes (Signed)
Primary Care Physician: [name], MD  Primary Gastroenterologist:  Garfield Cornea, MD   Chief Complaint  Patient presents with  . perforated duodenal ulcer    F/u. Doing fine. No abd pain, no issues    HPI: Dennis Yu is a 75 y.o. male here for follow-up.  Last seen in December 2021.  Patient has a history of IDA, perforated duodenal ulcer, abdominal pain, diarrhea.  Completed colonoscopy June 2021 with diverticular disease, single sessile serrated polyp without cytologic dysplasia and negative colon biopsies.  Plan for repeat colonoscopy in 5 years if overall health permits.  Hospitalized in June 2021 with epigastric abdominal pain, admitted for likely perforated duodenal ulcer maintained on TPN for several days along with antibiotics.  Upper GI/small bowel follow-through study with no leakage, ulceration of the anterior duodenal bulb, duodenitis in the proximal duodenum, at least 1 and possibly 2 medial diverticula of the descending duodenum, contained and without visible leak, large diverticulum of the distal duodenum without inflammatory changes. Diet was advanced and discharged on PPI twice daily with recommend EGD in 6 to 8 weeks.  Presented to the emergency department August 2021 with multiple symptoms.  CT scan noted inflammatory changes adjacent to the duodenum without evidence of perforation or abscess.  There was duodenal wall thickening with small duodenal diverticulum but no evidence of perforation or abscess.  Query peptic ulcer disease or duodenal diverticulitis.  Prominent.  Duodenal nodes likely reactive, but nonspecific.  Dr. Constance Haw consulted, findings felt to be chronic.  He was noted to not be on PPI therapy is recommended, advised to continue with PPI.  Patient never completed endoscopy, he declined.  Labs from April 2022: Hemoglobin 13.5, hematocrit 44.1, MCV 92.1, serum iron 66.  Ferritin 64.  Iron 57, iron saturation 16%, TIBC 347.  Patient states  that he is doing fairly well.  Rare diarrhea now.  Started taking his pantoprazole 30 minutes before breakfast and before evening meal which seemed to help his bowels.  States his PCP has encouraged him to try dropping back to once daily to see if this would help his diarrhea.  No abdominal pain. No significant weight loss.  Patient continues to decline EGD.  No heartburn, dysphagia, vomiting, melena, rectal bleeding.    Current Outpatient Medications  Medication Sig Dispense Refill  . aspirin 81 MG tablet Take 81 mg by mouth daily.    . carvedilol (COREG) 6.25 MG tablet Take 1 tablet (6.25 mg total) by mouth 2 (two) times daily with a meal. 180 tablet 3  . furosemide (LASIX) 40 MG tablet Take 1.5 tablets (60 mg total) by mouth daily. 135 tablet 3  . gabapentin (NEURONTIN) 300 MG capsule Take 3 capsules (900 mg total) by mouth daily. (Patient taking differently: Take 300-600 mg by mouth See admin instructions. 300 mg during the day, 600 mg at bedtime) 90 capsule 11  . glipiZIDE (GLUCOTROL) 5 MG tablet Take 5 mg by mouth daily.     . NON FORMULARY legatrin pm Daily    . Omega-3 Fatty Acids (FISH OIL) 1000 MG CAPS Take 1,000 mg by mouth daily.     . pantoprazole (PROTONIX) 40 MG tablet Take 1 tablet (40 mg total) by mouth daily. 30 tablet 0  . PROAIR HFA 108 (90 BASE) MCG/ACT inhaler Inhale 2 puffs into the lungs every 4 (four) hours as needed for wheezing or shortness of breath.     . sacubitril-valsartan (ENTRESTO) 24-26 MG Take 1 tablet  by mouth 2 (two) times daily.    . simvastatin (ZOCOR) 40 MG tablet Take 40 mg by mouth daily at 6 PM.     . warfarin (JANTOVEN) 5 MG tablet TAKE 1 TABLET DAILY EXCEPT 2 TABLETS ON  FRIDAYS 100 tablet 3   No current facility-administered medications for this visit.    Allergies as of 01/03/2021  . (No Known Allergies)    ROS:  General: Negative for anorexia, weight loss, fever, chills, fatigue, weakness. ENT: Negative for hoarseness, difficulty  swallowing , nasal congestion. CV: Negative for chest pain, angina, palpitations, dyspnea on exertion, peripheral edema.  Respiratory: Negative for dyspnea at rest, dyspnea on exertion, cough, sputum, wheezing.  GI: See history of present illness. GU:  Negative for dysuria, hematuria, urinary incontinence, urinary frequency, nocturnal urination.  Endo: Negative for unusual weight change.    Physical Examination:   BP 110/61   Pulse (!) 58   Temp (!) 97.3 F (36.3 C)   Ht 6' (1.829 m)   Wt 198 lb 6.4 oz (90 kg)   BMI 26.91 kg/m   General: Well-nourished, well-developed in no acute distress.  Eyes: No icterus. Mouth: masked Lungs: Clear to auscultation bilaterally.  Heart: Regular rate and rhythm, no murmurs rubs or gallops.  Abdomen: Bowel sounds are normal, nontender, nondistended, no hepatosplenomegaly or masses, no abdominal bruits or hernia , no rebound or guarding.   Extremities: No lower extremity edema. No clubbing or deformities. Neuro: Alert and oriented x 4   Skin: Warm and dry, no jaundice.   Psych: Alert and cooperative, normal mood and affect.  Labs:  Lab Results  Component Value Date   CREATININE 1.35 (H) 09/08/2020   BUN 15 09/08/2020   NA 137 09/08/2020   K 4.5 09/08/2020   CL 105 09/08/2020   CO2 26 09/08/2020   Lab Results  Component Value Date   ALT 22 09/08/2020   AST 22 09/08/2020   ALKPHOS 58 09/08/2020   BILITOT 0.7 09/08/2020   Lab Results  Component Value Date   WBC 5.9 11/08/2020   HGB 13.5 11/08/2020   HCT 44.1 11/08/2020   MCV 92.1 11/08/2020   PLT 208 11/08/2020   Lab Results  Component Value Date   IRON 57 11/08/2020   TIBC 347 11/08/2020   FERRITIN 64 11/08/2020     Imaging Studies: No results found.   Assessment: Pleasant 75 year old male with history of abdominal pain, GERD, diarrhea, IDA with hospitalization back in June 2021 for suspected perforated duodenal ulcer.  He was followed by surgery during this  hospitalization.  He was recommended to have an EGD to follow-up CT findings (6 to 8 weeks postdischarge) but patient declined on several occasions.  It was discussed again today, part of reason for follow-up EGD is to document ulcer healing and to exclude underlying malignancy, patient voiced understanding but continues to decline endoscopy as he is feeling well.  At this time he denies abdominal pain, reflux.  Diarrhea infrequent, improved after he started taking pantoprazole 30 minutes before a meal which he had not been doing before.  Recommend he try once daily dosing so that we can reduce his PPI to least effective dose.  If he has recurrent symptoms, he will go back to twice daily dosing.  History of IDA, recent labs from April improved with normal hemoglobin, ferritin.  Continues to follow with hematology.  Currently up-to-date on colonoscopy.   Plan: 1. Reduce pantoprazole to 40 mg, 30 minutes before breakfast.  If any recurrent abdominal pain, reflux he can go back to twice daily dosing. 2. He is to call if he has any recurrent diarrhea, GI concerns. 3. Will continue to follow with Dr. Delton Coombes regarding history of anemia. 4. Return office in 6 months or call sooner if needed.

## 2021-01-09 ENCOUNTER — Ambulatory Visit (INDEPENDENT_AMBULATORY_CARE_PROVIDER_SITE_OTHER): Payer: Medicare Other | Admitting: *Deleted

## 2021-01-09 DIAGNOSIS — Z5181 Encounter for therapeutic drug level monitoring: Secondary | ICD-10-CM

## 2021-01-09 DIAGNOSIS — I6350 Cerebral infarction due to unspecified occlusion or stenosis of unspecified cerebral artery: Secondary | ICD-10-CM

## 2021-01-09 DIAGNOSIS — I639 Cerebral infarction, unspecified: Secondary | ICD-10-CM

## 2021-01-09 DIAGNOSIS — Z7901 Long term (current) use of anticoagulants: Secondary | ICD-10-CM

## 2021-01-09 DIAGNOSIS — Z86718 Personal history of other venous thrombosis and embolism: Secondary | ICD-10-CM

## 2021-01-09 LAB — POCT INR: INR: 1.8 — AB (ref 2.0–3.0)

## 2021-01-09 NOTE — Patient Instructions (Signed)
Take warfarin 1 1/2 tablets x 2 days then resume 1 tablet daily  Recheck in 6 weeks

## 2021-02-09 DIAGNOSIS — E1142 Type 2 diabetes mellitus with diabetic polyneuropathy: Secondary | ICD-10-CM | POA: Diagnosis not present

## 2021-02-09 DIAGNOSIS — M722 Plantar fascial fibromatosis: Secondary | ICD-10-CM | POA: Diagnosis not present

## 2021-02-09 DIAGNOSIS — B351 Tinea unguium: Secondary | ICD-10-CM | POA: Diagnosis not present

## 2021-02-20 ENCOUNTER — Other Ambulatory Visit: Payer: Self-pay

## 2021-02-20 ENCOUNTER — Ambulatory Visit (INDEPENDENT_AMBULATORY_CARE_PROVIDER_SITE_OTHER): Payer: Medicare Other | Admitting: *Deleted

## 2021-02-20 DIAGNOSIS — I6350 Cerebral infarction due to unspecified occlusion or stenosis of unspecified cerebral artery: Secondary | ICD-10-CM

## 2021-02-20 DIAGNOSIS — Z5181 Encounter for therapeutic drug level monitoring: Secondary | ICD-10-CM

## 2021-02-20 DIAGNOSIS — Z7901 Long term (current) use of anticoagulants: Secondary | ICD-10-CM | POA: Diagnosis not present

## 2021-02-20 DIAGNOSIS — Z86718 Personal history of other venous thrombosis and embolism: Secondary | ICD-10-CM

## 2021-02-20 DIAGNOSIS — I639 Cerebral infarction, unspecified: Secondary | ICD-10-CM | POA: Diagnosis not present

## 2021-02-20 LAB — POCT INR: INR: 1.6 — AB (ref 2.0–3.0)

## 2021-02-20 NOTE — Patient Instructions (Signed)
Take warfarin 2 tablets today then increase dose to 1 tablet daily except 1 1/2 tablets on Mondays, Wednesdays and Fridays  Recheck in 1 week

## 2021-02-21 DIAGNOSIS — E119 Type 2 diabetes mellitus without complications: Secondary | ICD-10-CM | POA: Diagnosis not present

## 2021-02-21 DIAGNOSIS — E782 Mixed hyperlipidemia: Secondary | ICD-10-CM | POA: Diagnosis not present

## 2021-02-28 ENCOUNTER — Ambulatory Visit (INDEPENDENT_AMBULATORY_CARE_PROVIDER_SITE_OTHER): Payer: Medicare Other | Admitting: *Deleted

## 2021-02-28 ENCOUNTER — Other Ambulatory Visit: Payer: Self-pay

## 2021-02-28 DIAGNOSIS — I6350 Cerebral infarction due to unspecified occlusion or stenosis of unspecified cerebral artery: Secondary | ICD-10-CM

## 2021-02-28 DIAGNOSIS — Z5181 Encounter for therapeutic drug level monitoring: Secondary | ICD-10-CM

## 2021-02-28 DIAGNOSIS — I639 Cerebral infarction, unspecified: Secondary | ICD-10-CM | POA: Diagnosis not present

## 2021-02-28 DIAGNOSIS — Z7901 Long term (current) use of anticoagulants: Secondary | ICD-10-CM | POA: Diagnosis not present

## 2021-02-28 DIAGNOSIS — Z86718 Personal history of other venous thrombosis and embolism: Secondary | ICD-10-CM | POA: Diagnosis not present

## 2021-02-28 LAB — POCT INR: INR: 2 (ref 2.0–3.0)

## 2021-02-28 NOTE — Patient Instructions (Signed)
Continue warfarin 1 tablet daily except 1 1/2 tablets on Mondays, Wednesdays and Fridays  Recheck in 3 weeks

## 2021-03-01 DIAGNOSIS — G9009 Other idiopathic peripheral autonomic neuropathy: Secondary | ICD-10-CM | POA: Diagnosis not present

## 2021-03-01 DIAGNOSIS — Z8673 Personal history of transient ischemic attack (TIA), and cerebral infarction without residual deficits: Secondary | ICD-10-CM | POA: Diagnosis not present

## 2021-03-01 DIAGNOSIS — I5032 Chronic diastolic (congestive) heart failure: Secondary | ICD-10-CM | POA: Diagnosis not present

## 2021-03-01 DIAGNOSIS — N1831 Chronic kidney disease, stage 3a: Secondary | ICD-10-CM | POA: Diagnosis not present

## 2021-03-01 DIAGNOSIS — E663 Overweight: Secondary | ICD-10-CM | POA: Diagnosis not present

## 2021-03-01 DIAGNOSIS — I482 Chronic atrial fibrillation, unspecified: Secondary | ICD-10-CM | POA: Diagnosis not present

## 2021-03-01 DIAGNOSIS — I129 Hypertensive chronic kidney disease with stage 1 through stage 4 chronic kidney disease, or unspecified chronic kidney disease: Secondary | ICD-10-CM | POA: Diagnosis not present

## 2021-03-01 DIAGNOSIS — E782 Mixed hyperlipidemia: Secondary | ICD-10-CM | POA: Diagnosis not present

## 2021-03-01 DIAGNOSIS — M7021 Olecranon bursitis, right elbow: Secondary | ICD-10-CM | POA: Diagnosis not present

## 2021-03-01 DIAGNOSIS — D509 Iron deficiency anemia, unspecified: Secondary | ICD-10-CM | POA: Diagnosis not present

## 2021-03-01 DIAGNOSIS — E1122 Type 2 diabetes mellitus with diabetic chronic kidney disease: Secondary | ICD-10-CM | POA: Diagnosis not present

## 2021-03-01 DIAGNOSIS — G4733 Obstructive sleep apnea (adult) (pediatric): Secondary | ICD-10-CM | POA: Diagnosis not present

## 2021-03-02 DIAGNOSIS — I5042 Chronic combined systolic (congestive) and diastolic (congestive) heart failure: Secondary | ICD-10-CM | POA: Diagnosis not present

## 2021-03-02 DIAGNOSIS — I129 Hypertensive chronic kidney disease with stage 1 through stage 4 chronic kidney disease, or unspecified chronic kidney disease: Secondary | ICD-10-CM | POA: Diagnosis not present

## 2021-03-02 DIAGNOSIS — E870 Hyperosmolality and hypernatremia: Secondary | ICD-10-CM | POA: Diagnosis not present

## 2021-03-02 DIAGNOSIS — E1122 Type 2 diabetes mellitus with diabetic chronic kidney disease: Secondary | ICD-10-CM | POA: Diagnosis not present

## 2021-03-02 DIAGNOSIS — N189 Chronic kidney disease, unspecified: Secondary | ICD-10-CM | POA: Diagnosis not present

## 2021-03-14 ENCOUNTER — Inpatient Hospital Stay (HOSPITAL_COMMUNITY): Payer: Medicare Other | Attending: Hematology

## 2021-03-14 ENCOUNTER — Other Ambulatory Visit: Payer: Self-pay

## 2021-03-14 DIAGNOSIS — E1122 Type 2 diabetes mellitus with diabetic chronic kidney disease: Secondary | ICD-10-CM | POA: Diagnosis not present

## 2021-03-14 DIAGNOSIS — Z7982 Long term (current) use of aspirin: Secondary | ICD-10-CM | POA: Insufficient documentation

## 2021-03-14 DIAGNOSIS — Z7984 Long term (current) use of oral hypoglycemic drugs: Secondary | ICD-10-CM | POA: Diagnosis not present

## 2021-03-14 DIAGNOSIS — Z8673 Personal history of transient ischemic attack (TIA), and cerebral infarction without residual deficits: Secondary | ICD-10-CM | POA: Diagnosis not present

## 2021-03-14 DIAGNOSIS — I129 Hypertensive chronic kidney disease with stage 1 through stage 4 chronic kidney disease, or unspecified chronic kidney disease: Secondary | ICD-10-CM | POA: Insufficient documentation

## 2021-03-14 DIAGNOSIS — D508 Other iron deficiency anemias: Secondary | ICD-10-CM

## 2021-03-14 DIAGNOSIS — G4733 Obstructive sleep apnea (adult) (pediatric): Secondary | ICD-10-CM | POA: Insufficient documentation

## 2021-03-14 DIAGNOSIS — D631 Anemia in chronic kidney disease: Secondary | ICD-10-CM | POA: Diagnosis not present

## 2021-03-14 DIAGNOSIS — D509 Iron deficiency anemia, unspecified: Secondary | ICD-10-CM | POA: Insufficient documentation

## 2021-03-14 DIAGNOSIS — Z79899 Other long term (current) drug therapy: Secondary | ICD-10-CM | POA: Insufficient documentation

## 2021-03-14 DIAGNOSIS — Z7901 Long term (current) use of anticoagulants: Secondary | ICD-10-CM | POA: Diagnosis not present

## 2021-03-14 DIAGNOSIS — N183 Chronic kidney disease, stage 3 unspecified: Secondary | ICD-10-CM | POA: Insufficient documentation

## 2021-03-14 LAB — CBC WITH DIFFERENTIAL/PLATELET
Abs Immature Granulocytes: 0.02 10*3/uL (ref 0.00–0.07)
Basophils Absolute: 0 10*3/uL (ref 0.0–0.1)
Basophils Relative: 1 %
Eosinophils Absolute: 0.3 10*3/uL (ref 0.0–0.5)
Eosinophils Relative: 4 %
HCT: 43.3 % (ref 39.0–52.0)
Hemoglobin: 13.2 g/dL (ref 13.0–17.0)
Immature Granulocytes: 0 %
Lymphocytes Relative: 20 %
Lymphs Abs: 1.3 10*3/uL (ref 0.7–4.0)
MCH: 28.8 pg (ref 26.0–34.0)
MCHC: 30.5 g/dL (ref 30.0–36.0)
MCV: 94.3 fL (ref 80.0–100.0)
Monocytes Absolute: 0.5 10*3/uL (ref 0.1–1.0)
Monocytes Relative: 8 %
Neutro Abs: 4.1 10*3/uL (ref 1.7–7.7)
Neutrophils Relative %: 67 %
Platelets: 181 10*3/uL (ref 150–400)
RBC: 4.59 MIL/uL (ref 4.22–5.81)
RDW: 12.9 % (ref 11.5–15.5)
WBC: 6.2 10*3/uL (ref 4.0–10.5)
nRBC: 0 % (ref 0.0–0.2)

## 2021-03-14 LAB — FERRITIN: Ferritin: 101 ng/mL (ref 24–336)

## 2021-03-14 LAB — IRON AND TIBC
Iron: 61 ug/dL (ref 45–182)
Saturation Ratios: 19 % (ref 17.9–39.5)
TIBC: 320 ug/dL (ref 250–450)
UIBC: 259 ug/dL

## 2021-03-20 NOTE — Progress Notes (Signed)
[facility], [place] [address]   CLINIC:  Medical Oncology/Hematology  PCP:  [name], MD [address] [place] [address] [phone]   REASON FOR VISIT:  Follow-up for iron deficiency anemia and anemia of chronic kidney disease  PRIOR THERAPY: None  CURRENT THERAPY: Iron tablet daily  INTERVAL HISTORY:  Dennis Yu 75 y.o. male returns for routine follow-up of his iron deficiency anemia.  He was last seen by Dr. Delton Coombes on 11/15/2020.  At today's visit, he reports feeling fairly well.  No recent hospitalizations, surgeries, or changes in baseline health status.  He continues to take Coumadin, but denies any bleeding events.  No epistaxis, hematemesis, hematochezia, or melena.  He continues to have fatigue, which has been unchanged and chronic for several years.  He also notes chronic dyspnea on exertion, reports that he uses an inhaler with some relief.  He denies any chest pain, palpitations, syncope.  No B symptoms such as fever, chills, night sweats, unintentional weight loss.  He continues to take his iron pill every day.  He has had some diarrhea for the past 7 months or so, which was at 1 point thought to possibly be from the iron pill.  However, his diarrhea has improved, he now reports that he has loose stools 2-3 times a week, intermittently, and not correlated with when he takes his iron.  His bowel movements are soft, but not watery.  He has a history of superficial thrombophlebitis of his right leg, but denies any current leg pain, swelling, or redness.  He has 50% energy and 100% appetite. He endorses that he is maintaining a stable weight.   REVIEW OF SYSTEMS:  Review of Systems  Constitutional:  Positive for fatigue (chronic, energy 50%). Negative for appetite change, chills, diaphoresis, fever and unexpected weight change.  HENT:   Negative for lump/mass and nosebleeds.   Eyes:  Negative for eye problems.   Respiratory:  Positive for shortness of breath (with exertion). Negative for cough and hemoptysis.   Cardiovascular:  Negative for chest pain, leg swelling and palpitations.  Gastrointestinal:  Positive for diarrhea (loose stools 2-3 x per week). Negative for abdominal pain, blood in stool, constipation, nausea and vomiting.  Genitourinary:  Negative for hematuria.   Skin: Negative.   Neurological:  Negative for dizziness, headaches and light-headedness.  Hematological:  Does not bruise/bleed easily.     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   Superficial phlebitis 12/10/2012   Type 2 diabetes mellitus ([place])    Vitamin D deficiency    Past Surgical History:  Procedure Laterality Date   BIOPSY  01/11/2020   Procedure: BIOPSY;  Surgeon: Daneil Dolin, MD;  [facility];  Service: Endoscopy;;   COLONOSCOPY WITH PROPOFOL N/A 01/11/2020   Procedure: COLONOSCOPY WITH PROPOFOL;  Surgeon: Daneil Dolin, MD;  [facility];  Service: Endoscopy;  Laterality: N/A;  10:15am   NO PAST SURGERIES     POLYPECTOMY  01/11/2020   Procedure: POLYPECTOMY;  Surgeon: Daneil Dolin, MD;  [facility];  Service: Endoscopy;;     SOCIAL HISTORY:  Social History   Socioeconomic History   Marital status: Married    Spouse name: Not on file   Number of children: Not on file   Years of education: Not on file   Highest education level: Not on file  Occupational History   Occupation: Truck driver    Employer: RETIRED  Tobacco Use   Smoking status: Former    Types: Cigarettes    Start date:  08/06/1967    Quit date: 08/06/2003    Years since quitting: 17.6   Smokeless tobacco: Never  Vaping Use   Vaping Use: Never used  Substance and Sexual Activity   Alcohol use: No    Alcohol/week: 0.0 standard drinks   Drug use: No   Sexual activity: Not Currently  Other Topics Concern   Not on file  Social History Narrative   Not on file   Social Determinants of Health   Financial Resource Strain: Not on file  Food Insecurity: Not on file  Transportation Needs: Not on file  Physical Activity: Not on file  Stress: Not on file  Social Connections: Not on file  Intimate Partner Violence: Not At Risk   Fear of Current or Ex-Partner: No   Emotionally Abused: No   Physically Abused: No   Sexually Abused: No    FAMILY HISTORY:  Family History  Problem Relation Age of Onset   Diabetes Father    Hypertension Other    Colon cancer Neg Hx     CURRENT MEDICATIONS:  Outpatient Encounter Medications as of 03/21/2021  Medication Sig   aspirin 81 MG tablet Take 81 mg by mouth daily.   carvedilol (COREG) 6.25 MG tablet Take 1 tablet (6.25 mg total) by mouth 2 (two) times daily with a meal.   furosemide (LASIX) 40 MG tablet Take 1.5 tablets (60 mg total) by mouth daily.   gabapentin (NEURONTIN) 300 MG capsule Take 3 capsules (900 mg total) by mouth daily. (Patient taking differently: Take 300-600 mg by mouth See admin instructions. 300 mg during the day, 600 mg at bedtime)   glipiZIDE (GLUCOTROL) 5 MG tablet Take 5 mg by mouth daily.    NON FORMULARY legatrin pm Daily   Omega-3 Fatty Acids (FISH OIL) 1000 MG CAPS Take 1,000 mg by mouth daily.    pantoprazole (PROTONIX) 40 MG tablet Take 1 tablet (40 mg total) by mouth daily.   PROAIR HFA 108 (90 BASE) MCG/ACT inhaler Inhale 2 puffs into the lungs every 4 (four) hours as needed for wheezing or shortness of breath.    sacubitril-valsartan (ENTRESTO) 24-26 MG Take 1 tablet by mouth 2 (two) times daily.   simvastatin (ZOCOR) 40 MG tablet  Take 40 mg by mouth daily at 6 PM.    warfarin (JANTOVEN) 5 MG tablet TAKE 1 TABLET DAILY EXCEPT 2 TABLETS ON  FRIDAYS   No facility-administered encounter medications on file as of 03/21/2021.    ALLERGIES:  No Known Allergies   PHYSICAL EXAM:  ECOG PERFORMANCE STATUS: 1 - Symptomatic but completely ambulatory  There were no vitals filed for this visit. There were no vitals filed for this visit. Physical Exam Constitutional:      Appearance: Normal appearance.  HENT:     Head: Normocephalic and atraumatic.     Mouth/Throat:     Mouth: Mucous membranes are moist.  Eyes:  Extraocular Movements: Extraocular movements intact.     Pupils: Pupils are equal, round, and reactive to light.  Cardiovascular:     Rate and Rhythm: Normal rate and regular rhythm.     Pulses: Normal pulses.     Heart sounds: Normal heart sounds.  Pulmonary:     Effort: Pulmonary effort is normal.     Breath sounds: Normal breath sounds.  Abdominal:     General: Bowel sounds are normal.     Palpations: Abdomen is soft.     Tenderness: There is no abdominal tenderness.  Musculoskeletal:        General: No swelling.     Right lower leg: No edema.     Left lower leg: No edema.  Lymphadenopathy:     Cervical: No cervical adenopathy.  Skin:    General: Skin is warm and dry.  Neurological:     Mental Status: He is alert and oriented to person, place, and time. Mental status is at baseline.     Comments: Mild residual deficits from prior CVA  Psychiatric:        Mood and Affect: Mood normal.        Behavior: Behavior normal.     LABORATORY DATA:  I have reviewed the labs as listed.  CBC    Component Value Date/Time   WBC 6.2 03/14/2021 0819   RBC 4.59 03/14/2021 0819   HGB 13.2 03/14/2021 0819   HCT 43.3 03/14/2021 0819   PLT 181 03/14/2021 0819   MCV 94.3 03/14/2021 0819   MCH 28.8 03/14/2021 0819   MCHC 30.5 03/14/2021 0819   RDW 12.9 03/14/2021 0819   LYMPHSABS 1.3 03/14/2021 0819    MONOABS 0.5 03/14/2021 0819   EOSABS 0.3 03/14/2021 0819   BASOSABS 0.0 03/14/2021 0819   CMP Latest Ref Rng & Units 09/08/2020 06/12/2020 05/31/2020  Glucose 70 - 99 mg/dL 109(H) 143(H) 252(H)  BUN 8 - 23 mg/dL 15 28(H) 28(H)  Creatinine 0.61 - 1.24 mg/dL 1.35(H) 1.64(H) 2.21(H)  Sodium 135 - 145 mmol/L 137 137 138  Potassium 3.5 - 5.1 mmol/L 4.5 4.7 4.9  Chloride 98 - 111 mmol/L 105 105 104  CO2 22 - 32 mmol/L '26 24 25  '$ Calcium 8.9 - 10.3 mg/dL 8.8(L) 9.2 8.9  Total Protein 6.5 - 8.1 g/dL 7.7 - -  Total Bilirubin 0.3 - 1.2 mg/dL 0.7 - -  Alkaline Phos 38 - 126 U/L 58 - -  AST 15 - 41 U/L 22 - -  ALT 0 - 44 U/L 22 - -    DIAGNOSTIC IMAGING:  I have independently reviewed the relevant imaging and discussed with the patient.  ASSESSMENT & PLAN: 1.  Normocytic anemia - Normocytic anemia secondary to iron deficiency and anemia of CKD - Colonoscopy on 01/11/2020 with diverticulosis in the sigmoid colon and the descending colon.  5 mm polyp in the ascending colon resected.  Nonbleeding internal hemorrhoids. - SPEP & UPEP checked by nephrologist was negative for monoclonal immunoglobulin or free light chain - No bright red blood per rectum or melena  - Currently taking ferrous sulfate 325 mg daily - Most recent labs (03/14/2021): Hgb 13.2 with MCV 94.3, essentially normal CBC; ferritin 101, iron saturation 19%; - PLAN: No indication for IV iron at this time.  Continue oral iron supplementation.  RTC in 6 months for repeat labs and phone visit.  2.  CKD stage III - Patient is established with nephrologist, baseline creatinine appears to be 1.3-1.8 - PLAN:  Continue follow-up with nephrology (Dr. Theador Hawthorne)  3.  History of superficial thrombophlebitis of right lower extremity - Patient had 3-4 episodes of superficial phlebitis without extension to the deep veins, starting in 2009 - Patient was started on Coumadin due to his CVA, and has not had any episodes of recurrent phlebitis since starting  Coumadin   PLAN SUMMARY & DISPOSITION: - Continue daily ferrous sulfate  -Labs in 6 months - RTC after labs  All questions were answered. The patient knows to call the clinic with any problems, questions or concerns.  Medical decision making: Low  Time spent on visit: I spent 15 minutes counseling the patient face to face. The total time spent in the appointment was 25 minutes and more than 50% was on counseling.   Dennis Rush, PA-C  03/21/2021 8:58 AM

## 2021-03-21 ENCOUNTER — Inpatient Hospital Stay (HOSPITAL_BASED_OUTPATIENT_CLINIC_OR_DEPARTMENT_OTHER): Payer: Medicare Other | Admitting: Physician Assistant

## 2021-03-21 ENCOUNTER — Ambulatory Visit (INDEPENDENT_AMBULATORY_CARE_PROVIDER_SITE_OTHER): Payer: Medicare Other | Admitting: *Deleted

## 2021-03-21 ENCOUNTER — Other Ambulatory Visit: Payer: Self-pay

## 2021-03-21 VITALS — BP 130/79 | HR 63 | Temp 96.8°F | Resp 17 | Wt 201.6 lb

## 2021-03-21 DIAGNOSIS — Z5181 Encounter for therapeutic drug level monitoring: Secondary | ICD-10-CM | POA: Diagnosis not present

## 2021-03-21 DIAGNOSIS — I639 Cerebral infarction, unspecified: Secondary | ICD-10-CM | POA: Diagnosis not present

## 2021-03-21 DIAGNOSIS — Z86718 Personal history of other venous thrombosis and embolism: Secondary | ICD-10-CM

## 2021-03-21 DIAGNOSIS — D508 Other iron deficiency anemias: Secondary | ICD-10-CM

## 2021-03-21 DIAGNOSIS — D509 Iron deficiency anemia, unspecified: Secondary | ICD-10-CM | POA: Diagnosis not present

## 2021-03-21 DIAGNOSIS — Z7901 Long term (current) use of anticoagulants: Secondary | ICD-10-CM

## 2021-03-21 DIAGNOSIS — E1122 Type 2 diabetes mellitus with diabetic chronic kidney disease: Secondary | ICD-10-CM | POA: Diagnosis not present

## 2021-03-21 DIAGNOSIS — I6350 Cerebral infarction due to unspecified occlusion or stenosis of unspecified cerebral artery: Secondary | ICD-10-CM

## 2021-03-21 DIAGNOSIS — D631 Anemia in chronic kidney disease: Secondary | ICD-10-CM | POA: Diagnosis not present

## 2021-03-21 DIAGNOSIS — G4733 Obstructive sleep apnea (adult) (pediatric): Secondary | ICD-10-CM | POA: Diagnosis not present

## 2021-03-21 DIAGNOSIS — I129 Hypertensive chronic kidney disease with stage 1 through stage 4 chronic kidney disease, or unspecified chronic kidney disease: Secondary | ICD-10-CM | POA: Diagnosis not present

## 2021-03-21 DIAGNOSIS — N183 Chronic kidney disease, stage 3 unspecified: Secondary | ICD-10-CM | POA: Diagnosis not present

## 2021-03-21 LAB — POCT INR: INR: 2.3 (ref 2.0–3.0)

## 2021-03-21 NOTE — Patient Instructions (Signed)
[place] at [facility] Discharge Instructions  You were seen today by Tarri Abernethy PA-C for your iron deficiency anemia.  Your blood and iron counts looked great at today's visit!  Continue to take the iron pill once a day.  We will check your labs and see you for follow-up visit in 6 months.   Thank you for choosing [place] at [facility] to provide your oncology and hematology care.  To afford each patient quality time with our provider, please arrive at least 15 minutes before your scheduled appointment time.   If you have a lab appointment with the [facility] please come in thru the Main Entrance and check in at the main information desk.  You need to re-schedule your appointment should you arrive 10 or more minutes late.  We strive to give you quality time with our providers, and arriving late affects you and other patients whose appointments are after yours.  Also, if you no show three or more times for appointments you may be dismissed from the clinic at the providers discretion.     Again, thank you for choosing [facility].  Our hope is that these requests will decrease the amount of time that you wait before being seen by our physicians.       _____________________________________________________________  Should you have questions after your visit to [facility], please contact our office at [phone] and follow the prompts.  Our office hours are 8:00 a.m. and 4:30 p.m. Monday - Friday.  Please note that voicemails left after 4:00 p.m. may not be returned until the following business day.  We are closed weekends and major holidays.  You do have access to a nurse 24-7, just call the main number to the clinic [phone] and do not press any options, hold on the line and a nurse will answer the phone.    For prescription refill requests, have your pharmacy contact our office and allow 72 hours.     Due to Covid, you will need to wear a mask upon entering the hospital. If you do not have a mask, a mask will be given to you at the Main Entrance upon arrival. For doctor visits, patients may have 1 support person age 99 or older with them. For treatment visits, patients can not have anyone with them due to social distancing guidelines and our immunocompromised population.

## 2021-03-21 NOTE — Patient Instructions (Signed)
Continue warfarin 1 tablet daily except 1 1/2 tablets on Mondays, Wednesdays and Fridays °Recheck in 4 weeks   °

## 2021-03-23 ENCOUNTER — Other Ambulatory Visit: Payer: Self-pay | Admitting: Gastroenterology

## 2021-04-06 ENCOUNTER — Telehealth: Payer: Self-pay | Admitting: Cardiology

## 2021-04-06 MED ORDER — FUROSEMIDE 40 MG PO TABS: 60.0000 mg | ORAL_TABLET | Freq: Every day | ORAL | 0 refills | Status: DC

## 2021-04-06 NOTE — Telephone Encounter (Signed)
*  STAT* If patient is at the pharmacy, call can be transferred to refill team.   1. Which medications need to be refilled? (please list name of each medication and dose if known)  furosemide (LASIX) 40 MG tablet [id number]  ENDED   2. Which pharmacy/location (including street and city if local pharmacy) is medication to be sent to? [facility]  3. Do they need a 30 day or 90 day supply?  90 day

## 2021-04-06 NOTE — Telephone Encounter (Signed)
Refill completed and sent to [facility] mail order

## 2021-04-11 ENCOUNTER — Other Ambulatory Visit: Payer: Self-pay | Admitting: Cardiology

## 2021-04-18 ENCOUNTER — Other Ambulatory Visit: Payer: Self-pay

## 2021-04-18 ENCOUNTER — Ambulatory Visit (INDEPENDENT_AMBULATORY_CARE_PROVIDER_SITE_OTHER): Payer: Medicare Other | Admitting: *Deleted

## 2021-04-18 DIAGNOSIS — Z86718 Personal history of other venous thrombosis and embolism: Secondary | ICD-10-CM

## 2021-04-18 DIAGNOSIS — I6350 Cerebral infarction due to unspecified occlusion or stenosis of unspecified cerebral artery: Secondary | ICD-10-CM

## 2021-04-18 DIAGNOSIS — Z5181 Encounter for therapeutic drug level monitoring: Secondary | ICD-10-CM

## 2021-04-18 DIAGNOSIS — Z7901 Long term (current) use of anticoagulants: Secondary | ICD-10-CM | POA: Diagnosis not present

## 2021-04-18 DIAGNOSIS — I639 Cerebral infarction, unspecified: Secondary | ICD-10-CM | POA: Diagnosis not present

## 2021-04-18 LAB — POCT INR: INR: 2.9 (ref 2.0–3.0)

## 2021-04-18 NOTE — Patient Instructions (Signed)
Continue warfarin 1 tablet daily except 1 1/2 tablets on Mondays, Wednesdays and Fridays Recheck in 6 weeks   

## 2021-04-20 DIAGNOSIS — B351 Tinea unguium: Secondary | ICD-10-CM | POA: Diagnosis not present

## 2021-04-20 DIAGNOSIS — E1142 Type 2 diabetes mellitus with diabetic polyneuropathy: Secondary | ICD-10-CM | POA: Diagnosis not present

## 2021-05-31 ENCOUNTER — Ambulatory Visit (INDEPENDENT_AMBULATORY_CARE_PROVIDER_SITE_OTHER): Payer: Medicare Other | Admitting: *Deleted

## 2021-05-31 ENCOUNTER — Other Ambulatory Visit: Payer: Self-pay

## 2021-05-31 DIAGNOSIS — Z7901 Long term (current) use of anticoagulants: Secondary | ICD-10-CM | POA: Diagnosis not present

## 2021-05-31 DIAGNOSIS — I639 Cerebral infarction, unspecified: Secondary | ICD-10-CM | POA: Diagnosis not present

## 2021-05-31 DIAGNOSIS — I6350 Cerebral infarction due to unspecified occlusion or stenosis of unspecified cerebral artery: Secondary | ICD-10-CM

## 2021-05-31 DIAGNOSIS — Z5181 Encounter for therapeutic drug level monitoring: Secondary | ICD-10-CM

## 2021-05-31 DIAGNOSIS — Z86718 Personal history of other venous thrombosis and embolism: Secondary | ICD-10-CM | POA: Diagnosis not present

## 2021-05-31 LAB — POCT INR: INR: 2.3 (ref 2.0–3.0)

## 2021-05-31 NOTE — Patient Instructions (Signed)
Description   ?Continue warfarin 1 tablet daily except 1 1/2 tablets on Mondays, Wednesdays and Fridays ?Recheck in 6 weeks   ? ?  ? ? ?

## 2021-06-07 DIAGNOSIS — E782 Mixed hyperlipidemia: Secondary | ICD-10-CM | POA: Diagnosis not present

## 2021-06-07 DIAGNOSIS — D509 Iron deficiency anemia, unspecified: Secondary | ICD-10-CM | POA: Diagnosis not present

## 2021-06-07 DIAGNOSIS — E1122 Type 2 diabetes mellitus with diabetic chronic kidney disease: Secondary | ICD-10-CM | POA: Diagnosis not present

## 2021-06-08 DIAGNOSIS — N189 Chronic kidney disease, unspecified: Secondary | ICD-10-CM | POA: Diagnosis not present

## 2021-06-08 DIAGNOSIS — E559 Vitamin D deficiency, unspecified: Secondary | ICD-10-CM | POA: Diagnosis not present

## 2021-06-08 DIAGNOSIS — E1122 Type 2 diabetes mellitus with diabetic chronic kidney disease: Secondary | ICD-10-CM | POA: Diagnosis not present

## 2021-06-08 DIAGNOSIS — I129 Hypertensive chronic kidney disease with stage 1 through stage 4 chronic kidney disease, or unspecified chronic kidney disease: Secondary | ICD-10-CM | POA: Diagnosis not present

## 2021-06-08 DIAGNOSIS — E870 Hyperosmolality and hypernatremia: Secondary | ICD-10-CM | POA: Diagnosis not present

## 2021-06-08 DIAGNOSIS — I5042 Chronic combined systolic (congestive) and diastolic (congestive) heart failure: Secondary | ICD-10-CM | POA: Diagnosis not present

## 2021-06-12 ENCOUNTER — Encounter: Payer: Self-pay | Admitting: Internal Medicine

## 2021-06-12 DIAGNOSIS — G4733 Obstructive sleep apnea (adult) (pediatric): Secondary | ICD-10-CM | POA: Diagnosis not present

## 2021-06-12 DIAGNOSIS — Z23 Encounter for immunization: Secondary | ICD-10-CM | POA: Diagnosis not present

## 2021-06-12 DIAGNOSIS — I5032 Chronic diastolic (congestive) heart failure: Secondary | ICD-10-CM | POA: Diagnosis not present

## 2021-06-12 DIAGNOSIS — E782 Mixed hyperlipidemia: Secondary | ICD-10-CM | POA: Diagnosis not present

## 2021-06-12 DIAGNOSIS — E1122 Type 2 diabetes mellitus with diabetic chronic kidney disease: Secondary | ICD-10-CM | POA: Diagnosis not present

## 2021-06-12 DIAGNOSIS — Z8673 Personal history of transient ischemic attack (TIA), and cerebral infarction without residual deficits: Secondary | ICD-10-CM | POA: Diagnosis not present

## 2021-06-12 DIAGNOSIS — D509 Iron deficiency anemia, unspecified: Secondary | ICD-10-CM | POA: Diagnosis not present

## 2021-06-12 DIAGNOSIS — N1831 Chronic kidney disease, stage 3a: Secondary | ICD-10-CM | POA: Diagnosis not present

## 2021-06-12 DIAGNOSIS — I482 Chronic atrial fibrillation, unspecified: Secondary | ICD-10-CM | POA: Diagnosis not present

## 2021-06-12 DIAGNOSIS — G9009 Other idiopathic peripheral autonomic neuropathy: Secondary | ICD-10-CM | POA: Diagnosis not present

## 2021-06-12 DIAGNOSIS — I129 Hypertensive chronic kidney disease with stage 1 through stage 4 chronic kidney disease, or unspecified chronic kidney disease: Secondary | ICD-10-CM | POA: Diagnosis not present

## 2021-06-12 DIAGNOSIS — E663 Overweight: Secondary | ICD-10-CM | POA: Diagnosis not present

## 2021-06-13 DIAGNOSIS — Z23 Encounter for immunization: Secondary | ICD-10-CM | POA: Diagnosis not present

## 2021-07-03 ENCOUNTER — Telehealth: Payer: Self-pay | Admitting: *Deleted

## 2021-07-03 NOTE — Telephone Encounter (Signed)
Pt assistance forms faxed to [employer] patient assistance.

## 2021-07-06 DIAGNOSIS — E1142 Type 2 diabetes mellitus with diabetic polyneuropathy: Secondary | ICD-10-CM | POA: Diagnosis not present

## 2021-07-06 DIAGNOSIS — B351 Tinea unguium: Secondary | ICD-10-CM | POA: Diagnosis not present

## 2021-07-11 ENCOUNTER — Other Ambulatory Visit: Payer: Self-pay

## 2021-07-11 MED ORDER — SACUBITRIL-VALSARTAN 24-26 MG PO TABS: 1.0000 | ORAL_TABLET | Freq: Two times a day (BID) | ORAL | 5 refills | Status: DC

## 2021-07-12 ENCOUNTER — Ambulatory Visit (INDEPENDENT_AMBULATORY_CARE_PROVIDER_SITE_OTHER): Payer: Medicare Other | Admitting: *Deleted

## 2021-07-12 ENCOUNTER — Other Ambulatory Visit: Payer: Self-pay

## 2021-07-12 DIAGNOSIS — Z5181 Encounter for therapeutic drug level monitoring: Secondary | ICD-10-CM | POA: Diagnosis not present

## 2021-07-12 DIAGNOSIS — Z7901 Long term (current) use of anticoagulants: Secondary | ICD-10-CM

## 2021-07-12 DIAGNOSIS — I639 Cerebral infarction, unspecified: Secondary | ICD-10-CM

## 2021-07-12 DIAGNOSIS — I6350 Cerebral infarction due to unspecified occlusion or stenosis of unspecified cerebral artery: Secondary | ICD-10-CM

## 2021-07-12 DIAGNOSIS — Z86718 Personal history of other venous thrombosis and embolism: Secondary | ICD-10-CM

## 2021-07-12 LAB — POCT INR: INR: 1.8 — AB (ref 2.0–3.0)

## 2021-07-12 NOTE — Patient Instructions (Signed)
Take warfarin 2 tablets tonight then resume 1 tablet daily except 1 1/2 tablets on Mondays, Wednesdays and Fridays  Recheck in 4 weeks

## 2021-08-09 ENCOUNTER — Ambulatory Visit (INDEPENDENT_AMBULATORY_CARE_PROVIDER_SITE_OTHER): Payer: Medicare Other | Admitting: *Deleted

## 2021-08-09 DIAGNOSIS — Z86718 Personal history of other venous thrombosis and embolism: Secondary | ICD-10-CM

## 2021-08-09 DIAGNOSIS — I6350 Cerebral infarction due to unspecified occlusion or stenosis of unspecified cerebral artery: Secondary | ICD-10-CM

## 2021-08-09 DIAGNOSIS — I639 Cerebral infarction, unspecified: Secondary | ICD-10-CM | POA: Diagnosis not present

## 2021-08-09 DIAGNOSIS — Z7901 Long term (current) use of anticoagulants: Secondary | ICD-10-CM

## 2021-08-09 DIAGNOSIS — Z5181 Encounter for therapeutic drug level monitoring: Secondary | ICD-10-CM

## 2021-08-09 LAB — POCT INR: INR: 2.4 (ref 2.0–3.0)

## 2021-08-09 NOTE — Patient Instructions (Signed)
Continue warfarin 1 tablet daily except 1 1/2 tablets on Mondays, Wednesdays and Fridays °Recheck in 4 weeks   °

## 2021-08-12 ENCOUNTER — Other Ambulatory Visit: Payer: Self-pay | Admitting: Cardiology

## 2021-08-13 ENCOUNTER — Telehealth: Payer: Self-pay | Admitting: *Deleted

## 2021-08-13 NOTE — Telephone Encounter (Signed)
Received a fax that pt has been approved to receive Entresto at no cost until 08/04/22.

## 2021-08-20 ENCOUNTER — Other Ambulatory Visit: Payer: Self-pay | Admitting: Cardiology

## 2021-08-28 ENCOUNTER — Telehealth: Payer: Self-pay | Admitting: Cardiology

## 2021-08-28 MED ORDER — SACUBITRIL-VALSARTAN 24-26 MG PO TABS: 1.0000 | ORAL_TABLET | Freq: Two times a day (BID) | ORAL | 1 refills | Status: DC

## 2021-08-28 NOTE — Telephone Encounter (Signed)
°*  STAT* If patient is at the pharmacy, call can be transferred to refill team.   1. Which medications need to be refilled? (please list name of each medication and dose if known)  sacubitril-valsartan (ENTRESTO) 24-26 MG  2. Which pharmacy/location (including street and city if local pharmacy) is medication to be sent to? [facility] [place] [place] [facility]  3. Do they need a 30 day or 90 day supply?  90 day supply     Patient has been out of medication for 2 weeks waiting on prescription.

## 2021-08-28 NOTE — Telephone Encounter (Signed)
Refill complete 

## 2021-09-03 ENCOUNTER — Other Ambulatory Visit: Payer: Self-pay

## 2021-09-03 ENCOUNTER — Telehealth: Payer: Self-pay | Admitting: Cardiology

## 2021-09-03 MED ORDER — SACUBITRIL-VALSARTAN 24-26 MG PO TABS: 1.0000 | ORAL_TABLET | Freq: Two times a day (BID) | ORAL | 1 refills | Status: DC

## 2021-09-03 NOTE — Telephone Encounter (Signed)
Pt wife said that [employer] medication [facility] Loll [facility] when it needed to be called in to [facility] at [phone].

## 2021-09-03 NOTE — Telephone Encounter (Signed)
Completed.

## 2021-09-06 ENCOUNTER — Ambulatory Visit (INDEPENDENT_AMBULATORY_CARE_PROVIDER_SITE_OTHER): Payer: Medicare Other | Admitting: *Deleted

## 2021-09-06 DIAGNOSIS — Z7901 Long term (current) use of anticoagulants: Secondary | ICD-10-CM

## 2021-09-06 DIAGNOSIS — I6350 Cerebral infarction due to unspecified occlusion or stenosis of unspecified cerebral artery: Secondary | ICD-10-CM

## 2021-09-06 DIAGNOSIS — Z86718 Personal history of other venous thrombosis and embolism: Secondary | ICD-10-CM

## 2021-09-06 DIAGNOSIS — I639 Cerebral infarction, unspecified: Secondary | ICD-10-CM | POA: Diagnosis not present

## 2021-09-06 DIAGNOSIS — Z5181 Encounter for therapeutic drug level monitoring: Secondary | ICD-10-CM

## 2021-09-06 LAB — POCT INR: INR: 2.6 (ref 2.0–3.0)

## 2021-09-06 NOTE — Patient Instructions (Signed)
Continue warfarin 1 tablet daily except 1 1/2 tablets on Mondays, Wednesdays and Fridays  Recheck in 5 weeks

## 2021-09-07 DIAGNOSIS — E1122 Type 2 diabetes mellitus with diabetic chronic kidney disease: Secondary | ICD-10-CM | POA: Diagnosis not present

## 2021-09-07 DIAGNOSIS — E782 Mixed hyperlipidemia: Secondary | ICD-10-CM | POA: Diagnosis not present

## 2021-09-12 ENCOUNTER — Ambulatory Visit (INDEPENDENT_AMBULATORY_CARE_PROVIDER_SITE_OTHER): Payer: Medicare Other | Admitting: Cardiology

## 2021-09-12 ENCOUNTER — Other Ambulatory Visit: Payer: Self-pay

## 2021-09-12 ENCOUNTER — Encounter: Payer: Self-pay | Admitting: Cardiology

## 2021-09-12 VITALS — BP 136/80 | HR 76 | Ht 72.0 in | Wt 201.6 lb

## 2021-09-12 DIAGNOSIS — I255 Ischemic cardiomyopathy: Secondary | ICD-10-CM | POA: Diagnosis not present

## 2021-09-12 DIAGNOSIS — I6350 Cerebral infarction due to unspecified occlusion or stenosis of unspecified cerebral artery: Secondary | ICD-10-CM | POA: Diagnosis not present

## 2021-09-12 DIAGNOSIS — N1832 Chronic kidney disease, stage 3b: Secondary | ICD-10-CM

## 2021-09-12 DIAGNOSIS — I5020 Unspecified systolic (congestive) heart failure: Secondary | ICD-10-CM | POA: Diagnosis not present

## 2021-09-12 NOTE — Patient Instructions (Signed)
Medication Instructions:  Resume taking Coreg 6.25 mg twice a day. Call our office if you develop diarrhea.  Labwork: None today  Testing/Procedures: None today  Follow-Up: 3 months  Any Other Special Instructions Will Be Listed Below (If Applicable).  If you need a refill on your cardiac medications before your next appointment, please call your pharmacy.

## 2021-09-12 NOTE — Progress Notes (Signed)
Cardiology Office Note  Date: 09/12/2021   ID: Dennis, Yu [birth year], MRN [id number]  PCP:  Dennis Squibb, MD  Cardiologist:  Dennis Lesches, MD Electrophysiologist:  None   Chief Complaint  Patient presents with   Cardiac follow-up    History of Present Illness: Dennis Yu is a 76 y.o. male last seen in May 2022.  He is here for a follow-up visit.  In talking with him today, he has apparently been having trouble getting his medications filled consistently, although did get approval for Entresto coverage.  I have asked nursing to check into his prescriptions and his pharmacy service.  He has been out of Entresto for a month, just recently got carvedilol filled after being out of it for a month.  He did notice that diarrhea he was experiencing previously resolved when he ran out of Coreg.  I asked him to let me know if this returns with repeat use of the medication as we should switch him to a different beta-blocker.  He describes NYHA class II dyspnea, no angina, no dizziness or syncope.  He is still following with Dr. Theador Yu, last creatinine was 1.4-1.5 range.  Also sees Dr. [place] Yu this week.  He states that he was started on Farxiga but could not afford it.  He is on Coumadin with follow-up in the anticoagulation clinic.  Recent INR 2.6.  Past Medical History:  Diagnosis Date   Bronchitis 11/2012   Cardiomyopathy    LVEF 25%   Erectile dysfunction    Essential hypertension    GERD (gastroesophageal reflux disease)    History of colonic polyps    Mixed hyperlipidemia    Mural thrombus of left ventricle    Obstructive sleep apnea    Patent foramen ovale    Phlebitis    Stroke ([place])    Embolic left temporal 6/50 s/p tPA   Superficial phlebitis 12/10/2012   Type 2 diabetes mellitus ([place])    Vitamin D deficiency     Past Surgical History:  Procedure Laterality Date   BIOPSY  01/11/2020   Procedure: BIOPSY;  Surgeon: Dennis Dolin, MD;  [facility];  Service: Endoscopy;;   COLONOSCOPY WITH PROPOFOL N/A 01/11/2020   Procedure: COLONOSCOPY WITH PROPOFOL;  Surgeon: Dennis Dolin, MD;  [facility];  Service: Endoscopy;  Laterality: N/A;  10:15am   NO PAST SURGERIES     POLYPECTOMY  01/11/2020   Procedure: POLYPECTOMY;  Surgeon: Dennis Dolin, MD;  [facility];  Service: Endoscopy;;    Current Outpatient Medications  Medication Sig Dispense Refill   aspirin 81 MG tablet Take 81 mg by mouth daily.     atorvastatin (LIPITOR) 40 MG tablet Take 40 mg by mouth daily.     furosemide (LASIX) 40 MG tablet TAKE 1 AND 1/2 TABLETS     DAILY 135 tablet 1   gabapentin (NEURONTIN) 300 MG capsule Take 3 capsules (900 mg total) by mouth daily. (Patient taking differently: Take 300-600 mg by mouth See admin instructions. 300 mg during the day, 600 mg at bedtime) 90 capsule 11   glipiZIDE (GLUCOTROL) 5 MG tablet Take 5 mg by mouth daily.      NON FORMULARY legatrin pm Daily     PROAIR HFA 108 (90 BASE) MCG/ACT inhaler Inhale 2 puffs into the lungs every 4 (four) hours as needed for wheezing or shortness of breath.      simvastatin (ZOCOR) 40 MG  tablet Take 40 mg by mouth daily at 6 PM.      warfarin (JANTOVEN) 5 MG tablet TAKE 1 TABLET DAILY EXCEPT 1 1/2 TABLETS ON MONDAYS, WEDNESDAYS AND FRIDAYS OR AS DIRECTED 120 tablet 4   carvedilol (COREG) 6.25 MG tablet TAKE 1 TABLET TWICE DAILY  WITH MEALS (Patient not taking: Reported on 09/12/2021) 180 tablet 3   Omega-3 Fatty Acids (FISH OIL) 1000 MG CAPS Take 1,000 mg by mouth daily.  (Patient not taking: Reported on 09/12/2021)     pantoprazole (PROTONIX) 40 MG tablet Take one tablet once to twice daily before a meal for reflux/abdominal pain. (Patient not taking: Reported on 09/12/2021) 180 tablet 1   sacubitril-valsartan (ENTRESTO) 24-26 MG Take 1 tablet by mouth 2 (two) times daily. (Patient not taking: Reported on 09/12/2021) 180 tablet 1   No current facility-administered medications  for this visit.   Allergies:  Patient has no known allergies.   ROS: No syncope.  Physical Exam: VS:  BP 136/80    Pulse 76    Ht 6' (1.829 m)    Wt 201 lb 9.6 oz (91.4 kg)    SpO2 95%    BMI 27.34 kg/m , BMI Body mass index is 27.34 kg/m.  Wt Readings from Last 3 Encounters:  09/12/21 201 lb 9.6 oz (91.4 kg)  03/21/21 201 lb 9.6 oz (91.4 kg)  01/03/21 198 lb 6.4 oz (90 kg)    General: Patient appears comfortable at rest. HEENT: Conjunctiva and lids normal, wearing a mask. Neck: Supple, no elevated JVP or carotid bruits, no thyromegaly. Lungs: Clear to auscultation, nonlabored breathing at rest. Cardiac: Regular rate and rhythm, no S3, 1/6 systolic murmur, no pericardial rub. Extremities: No pitting edema.  ECG:  An ECG dated 03/29/2020 was personally reviewed today and demonstrated:  Sinus rhythm with old inferior infarct pattern, fusion beat.  Recent Labwork: 03/14/2021: Hemoglobin 13.2; Platelets 181     Component Value Date/Time   CHOL 145 04/05/2011 0545   TRIG 143 01/17/2020 0600   HDL 40 04/05/2011 0545   CHOLHDL 3.6 04/05/2011 0545   VLDL 13 04/05/2011 0545   LDLCALC 92 04/05/2011 0545    Other Studies Reviewed Today:  Echocardiogram 02/23/2020:  1. Left ventricular ejection fraction, by estimation, is approximately  25%. The left ventricle has severely decreased function. The left  ventricle demonstrates regional wall motion abnormalities (see scoring  diagram/findings for description). The left  ventricular internal cavity size was mildly dilated. Left ventricular  diastolic parameters are indeterminate.   2. Calcified apical LV mural thrombus present. Also prominent apical  trabeculation.   3. Right ventricular systolic function is normal. The right ventricular  size is normal. Tricuspid regurgitation signal is inadequate for assessing  PA pressure.   4. Left atrial size was mildly dilated.   5. The mitral valve is abnormal, mild to moderate annular  calcification  and restricted posterior leaflet motion. Moderate mitral valve  regurgitation.   6. The aortic valve is tricuspid, moderate annular calcification. Aortic  valve regurgitation is not visualized.   7. The inferior vena cava is normal in size with greater than 50%  respiratory variability, suggesting right atrial pressure of 3 mmHg.   Assessment and Plan:  1.  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  He reports NYHA class II dyspnea, weight is stable.  Having difficulty with consistent medication use as noted above, we are checking into his pharmacy coverage.  Reportedly already approved for [facility] assistance.  Would resume carvedilol, however if he has recurrent diarrhea we will switch to a different beta-blocker, I asked him to call us and let us know.  Also continue Entresto and current dose of Lasix.  Once he is on a consistent regimen, we will consider looking into coverage for Jardiance instead of Farxiga, possibly even Aldactone depending on his renal function.  2.  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  3.  History of stroke with calcified apical LV mural thrombus.  We have kept him on long-term Coumadin, he follows in the anticoagulation clinic with recent therapeutic INR.  Medication Adjustments/Labs and Tests Ordered: Current medicines are reviewed at length with the patient today.  Concerns regarding medicines are outlined above.   Tests Ordered: Orders Placed This Encounter  Procedures   EKG 12-Lead    Medication Changes: No orders of the defined types were placed in this encounter.   Disposition:  Follow up  3 months.  Signed, Satira Sark, MD, [facility] 09/12/2021 8:41 AM    [facility] at [place]. [address] [place], [place] [address] Phone: [phone]; Fax: [phone]

## 2021-09-13 DIAGNOSIS — G9009 Other idiopathic peripheral autonomic neuropathy: Secondary | ICD-10-CM | POA: Diagnosis not present

## 2021-09-13 DIAGNOSIS — Z8673 Personal history of transient ischemic attack (TIA), and cerebral infarction without residual deficits: Secondary | ICD-10-CM | POA: Diagnosis not present

## 2021-09-13 DIAGNOSIS — N1831 Chronic kidney disease, stage 3a: Secondary | ICD-10-CM | POA: Diagnosis not present

## 2021-09-13 DIAGNOSIS — E1142 Type 2 diabetes mellitus with diabetic polyneuropathy: Secondary | ICD-10-CM | POA: Diagnosis not present

## 2021-09-13 DIAGNOSIS — I482 Chronic atrial fibrillation, unspecified: Secondary | ICD-10-CM | POA: Diagnosis not present

## 2021-09-13 DIAGNOSIS — E663 Overweight: Secondary | ICD-10-CM | POA: Diagnosis not present

## 2021-09-13 DIAGNOSIS — I129 Hypertensive chronic kidney disease with stage 1 through stage 4 chronic kidney disease, or unspecified chronic kidney disease: Secondary | ICD-10-CM | POA: Diagnosis not present

## 2021-09-13 DIAGNOSIS — B351 Tinea unguium: Secondary | ICD-10-CM | POA: Diagnosis not present

## 2021-09-13 DIAGNOSIS — R252 Cramp and spasm: Secondary | ICD-10-CM | POA: Diagnosis not present

## 2021-09-13 DIAGNOSIS — E1122 Type 2 diabetes mellitus with diabetic chronic kidney disease: Secondary | ICD-10-CM | POA: Diagnosis not present

## 2021-09-13 DIAGNOSIS — D509 Iron deficiency anemia, unspecified: Secondary | ICD-10-CM | POA: Diagnosis not present

## 2021-09-13 DIAGNOSIS — E782 Mixed hyperlipidemia: Secondary | ICD-10-CM | POA: Diagnosis not present

## 2021-09-13 DIAGNOSIS — I5022 Chronic systolic (congestive) heart failure: Secondary | ICD-10-CM | POA: Diagnosis not present

## 2021-09-13 DIAGNOSIS — G4733 Obstructive sleep apnea (adult) (pediatric): Secondary | ICD-10-CM | POA: Diagnosis not present

## 2021-09-19 DIAGNOSIS — E1122 Type 2 diabetes mellitus with diabetic chronic kidney disease: Secondary | ICD-10-CM | POA: Diagnosis not present

## 2021-09-19 DIAGNOSIS — I9589 Other hypotension: Secondary | ICD-10-CM | POA: Diagnosis not present

## 2021-09-19 DIAGNOSIS — I5042 Chronic combined systolic (congestive) and diastolic (congestive) heart failure: Secondary | ICD-10-CM | POA: Diagnosis not present

## 2021-09-19 DIAGNOSIS — N189 Chronic kidney disease, unspecified: Secondary | ICD-10-CM | POA: Diagnosis not present

## 2021-09-19 DIAGNOSIS — E559 Vitamin D deficiency, unspecified: Secondary | ICD-10-CM | POA: Diagnosis not present

## 2021-09-21 ENCOUNTER — Inpatient Hospital Stay (HOSPITAL_COMMUNITY): Payer: Medicare Other | Attending: Hematology

## 2021-09-21 DIAGNOSIS — Z8673 Personal history of transient ischemic attack (TIA), and cerebral infarction without residual deficits: Secondary | ICD-10-CM | POA: Insufficient documentation

## 2021-09-21 DIAGNOSIS — Z7982 Long term (current) use of aspirin: Secondary | ICD-10-CM | POA: Diagnosis not present

## 2021-09-21 DIAGNOSIS — D631 Anemia in chronic kidney disease: Secondary | ICD-10-CM | POA: Diagnosis not present

## 2021-09-21 DIAGNOSIS — Z7901 Long term (current) use of anticoagulants: Secondary | ICD-10-CM | POA: Insufficient documentation

## 2021-09-21 DIAGNOSIS — Z7984 Long term (current) use of oral hypoglycemic drugs: Secondary | ICD-10-CM | POA: Diagnosis not present

## 2021-09-21 DIAGNOSIS — Z79899 Other long term (current) drug therapy: Secondary | ICD-10-CM | POA: Diagnosis not present

## 2021-09-21 DIAGNOSIS — N183 Chronic kidney disease, stage 3 unspecified: Secondary | ICD-10-CM | POA: Diagnosis not present

## 2021-09-21 DIAGNOSIS — D509 Iron deficiency anemia, unspecified: Secondary | ICD-10-CM | POA: Insufficient documentation

## 2021-09-21 DIAGNOSIS — Z87891 Personal history of nicotine dependence: Secondary | ICD-10-CM | POA: Diagnosis not present

## 2021-09-21 DIAGNOSIS — D508 Other iron deficiency anemias: Secondary | ICD-10-CM

## 2021-09-21 LAB — CBC WITH DIFFERENTIAL/PLATELET
Abs Immature Granulocytes: 0.01 10*3/uL (ref 0.00–0.07)
Basophils Absolute: 0 10*3/uL (ref 0.0–0.1)
Basophils Relative: 1 %
Eosinophils Absolute: 0.2 10*3/uL (ref 0.0–0.5)
Eosinophils Relative: 3 %
HCT: 41.5 % (ref 39.0–52.0)
Hemoglobin: 12.7 g/dL — ABNORMAL LOW (ref 13.0–17.0)
Immature Granulocytes: 0 %
Lymphocytes Relative: 26 %
Lymphs Abs: 1.4 10*3/uL (ref 0.7–4.0)
MCH: 28.5 pg (ref 26.0–34.0)
MCHC: 30.6 g/dL (ref 30.0–36.0)
MCV: 93.3 fL (ref 80.0–100.0)
Monocytes Absolute: 0.5 10*3/uL (ref 0.1–1.0)
Monocytes Relative: 9 %
Neutro Abs: 3.2 10*3/uL (ref 1.7–7.7)
Neutrophils Relative %: 61 %
Platelets: 193 10*3/uL (ref 150–400)
RBC: 4.45 MIL/uL (ref 4.22–5.81)
RDW: 13.2 % (ref 11.5–15.5)
WBC: 5.3 10*3/uL (ref 4.0–10.5)
nRBC: 0 % (ref 0.0–0.2)

## 2021-09-21 LAB — IRON AND TIBC
Iron: 67 ug/dL (ref 45–182)
Saturation Ratios: 20 % (ref 17.9–39.5)
TIBC: 330 ug/dL (ref 250–450)
UIBC: 263 ug/dL

## 2021-09-21 LAB — FERRITIN: Ferritin: 146 ng/mL (ref 24–336)

## 2021-09-23 ENCOUNTER — Other Ambulatory Visit: Payer: Self-pay | Admitting: Gastroenterology

## 2021-09-27 NOTE — Progress Notes (Signed)
[place], [place] [address]   CLINIC:  Medical Oncology/Hematology  PCP:  [name], MD [address] [place] [address] [phone]   REASON FOR VISIT:  Follow-up for iron deficiency anemia and anemia of chronic kidney disease   PRIOR THERAPY: None   CURRENT THERAPY: Iron tablet daily   INTERVAL HISTORY:  Dennis Yu 76 y.o. male returns for routine follow-up of his iron deficiency anemia.  He was last seen by Tarri Abernethy PA-C on 03/21/2021.  At today's visit, he reports feeling well.  No recent hospitalizations, surgeries, or changes in baseline health status.  His only complaint today is chronic fatigue, which he reports is at baseline. He remains on Coumadin due to his history of CVA, denies having any bleeding episodes.  No epistaxis, hematemesis, hematochezia, or melena.  He denies any pica, headaches, chest pain, dyspnea on exertion, lightheadedness, or syncope.  He continues to take iron pill daily without any significant adverse effects.  He has 50% energy and 100% appetite. He endorses that he is maintaining a stable weight.   REVIEW OF SYSTEMS:  Review of Systems  Constitutional:  Positive for fatigue. Negative for appetite change, chills, diaphoresis, fever and unexpected weight change.  HENT:   Negative for lump/mass and nosebleeds.   Eyes:  Negative for eye problems.  Respiratory:  Negative for cough, hemoptysis and shortness of breath.   Cardiovascular:  Negative for chest pain, leg swelling and palpitations.  Gastrointestinal:  Negative for abdominal pain, blood in stool, constipation, diarrhea, nausea and vomiting.  Genitourinary:  Negative for hematuria.   Skin: Negative.   Neurological:  Negative for dizziness, headaches and light-headedness.  Hematological:  Does not bruise/bleed easily.     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   Superficial phlebitis 12/10/2012   Type 2 diabetes mellitus ([place])    Vitamin D deficiency    Past Surgical History:  Procedure Laterality Date   BIOPSY  01/11/2020   Procedure: BIOPSY;  Surgeon: Daneil Dolin, MD;  [facility];  Service: Endoscopy;;   COLONOSCOPY WITH PROPOFOL N/A 01/11/2020   Procedure: COLONOSCOPY WITH PROPOFOL;  Surgeon: Daneil Dolin, MD;  [facility];  Service: Endoscopy;  Laterality: N/A;  10:15am   NO PAST SURGERIES     POLYPECTOMY  01/11/2020   Procedure: POLYPECTOMY;  Surgeon: Daneil Dolin, MD;  [facility];  Service: Endoscopy;;     SOCIAL HISTORY:  Social History   Socioeconomic History   Marital status: Married    Spouse name: Not on file   Number of children: Not on file   Years of education: Not on file   Highest education level: Not on file  Occupational History   Occupation: Truck driver    Employer: RETIRED  Tobacco Use   Smoking status: Former    Types: Cigarettes    Start date: 08/06/1967    Quit date: 08/06/2003    Years since quitting:  18.1   Smokeless tobacco: Never  Vaping Use   Vaping Use: Never used  Substance and Sexual Activity   Alcohol use: No    Alcohol/week: 0.0 standard drinks   Drug use: No   Sexual activity: Not Currently  Other Topics Concern   Not on file  Social History Narrative   Not on file   Social Determinants of Health   Financial Resource Strain: Not on file  Food Insecurity: Not on file  Transportation Needs: Not on file  Physical Activity: Not on file  Stress: Not on file  Social Connections: Not on file  Intimate Partner Violence: Not  on file    FAMILY HISTORY:  Family History  Problem Relation Age of Onset   Diabetes Father    Hypertension Other    Colon cancer Neg Hx     CURRENT MEDICATIONS:  Outpatient Encounter Medications as of 09/28/2021  Medication Sig   aspirin 81 MG tablet Take 81 mg by mouth daily.   atorvastatin (LIPITOR) 40 MG tablet Take 40 mg by mouth daily.   carvedilol (COREG) 6.25 MG tablet TAKE 1 TABLET TWICE DAILY  WITH MEALS (Patient not taking: Reported on 09/12/2021)   furosemide (LASIX) 40 MG tablet TAKE 1 AND 1/2 TABLETS     DAILY   gabapentin (NEURONTIN) 300 MG capsule Take 3 capsules (900 mg total) by mouth daily. (Patient taking differently: Take 300-600 mg by mouth See admin instructions. 300 mg during the day, 600 mg at bedtime)   glipiZIDE (GLUCOTROL) 5 MG tablet Take 5 mg by mouth daily.    NON FORMULARY legatrin pm Daily   Omega-3 Fatty Acids (FISH OIL) 1000 MG CAPS Take 1,000 mg by mouth daily.  (Patient not taking: Reported on 09/12/2021)   pantoprazole (PROTONIX) 40 MG tablet TAKE 1 TABLET 1 TO 2 TIMES DAILY BEFORE MEALS FOR     REFLUX/ABDOMINAL PAIN   PROAIR HFA 108 (90 BASE) MCG/ACT inhaler Inhale 2 puffs into the lungs every 4 (four) hours as needed for wheezing or shortness of breath.    sacubitril-valsartan (ENTRESTO) 24-26 MG Take 1 tablet by mouth 2 (two) times daily. (Patient not taking: Reported on 09/12/2021)   simvastatin (ZOCOR) 40 MG tablet Take 40 mg by mouth daily at 6 PM.    warfarin (JANTOVEN) 5 MG tablet TAKE 1 TABLET DAILY EXCEPT 1 1/2 TABLETS ON MONDAYS, WEDNESDAYS AND FRIDAYS OR AS DIRECTED   No facility-administered encounter medications on file as of 09/28/2021.    ALLERGIES:  No Known Allergies   PHYSICAL EXAM:  ECOG PERFORMANCE STATUS: 1 - Symptomatic but completely ambulatory  There were no vitals filed for this visit. There were no vitals filed for this visit. Physical Exam Constitutional:      Appearance: Normal appearance.  HENT:     Head:  Normocephalic and atraumatic.     Mouth/Throat:     Mouth: Mucous membranes are moist.  Eyes:     Extraocular Movements: Extraocular movements intact.     Pupils: Pupils are equal, round, and reactive to light.  Cardiovascular:     Rate and Rhythm: Normal rate and regular rhythm.     Pulses: Normal pulses.     Heart sounds: Normal heart sounds.  Pulmonary:     Effort: Pulmonary effort is normal.     Breath sounds: Normal breath sounds.  Abdominal:     General: Bowel sounds are normal.     Palpations: Abdomen is soft.     Tenderness: There is  no abdominal tenderness.  Musculoskeletal:        General: No swelling.     Right lower leg: No edema.     Left lower leg: No edema.  Lymphadenopathy:     Cervical: No cervical adenopathy.  Skin:    General: Skin is warm and dry.  Neurological:     Mental Status: He is alert and oriented to person, place, and time. Mental status is at baseline.     Comments: Mild residual deficits from prior CVA  Psychiatric:        Mood and Affect: Mood normal.        Behavior: Behavior normal.     LABORATORY DATA:  I have reviewed the labs as listed.  CBC    Component Value Date/Time   WBC 5.3 09/21/2021 0751   RBC 4.45 09/21/2021 0751   HGB 12.7 (L) 09/21/2021 0751   HCT 41.5 09/21/2021 0751   PLT 193 09/21/2021 0751   MCV 93.3 09/21/2021 0751   MCH 28.5 09/21/2021 0751   MCHC 30.6 09/21/2021 0751   RDW 13.2 09/21/2021 0751   LYMPHSABS 1.4 09/21/2021 0751   MONOABS 0.5 09/21/2021 0751   EOSABS 0.2 09/21/2021 0751   BASOSABS 0.0 09/21/2021 0751   CMP Latest Ref Rng & Units 09/08/2020 06/12/2020 05/31/2020  Glucose 70 - 99 mg/dL 109(H) 143(H) 252(H)  BUN 8 - 23 mg/dL 15 28(H) 28(H)  Creatinine 0.61 - 1.24 mg/dL 1.35(H) 1.64(H) 2.21(H)  Sodium 135 - 145 mmol/L 137 137 138  Potassium 3.5 - 5.1 mmol/L 4.5 4.7 4.9  Chloride 98 - 111 mmol/L 105 105 104  CO2 22 - 32 mmol/L 26 24 25   Calcium 8.9 - 10.3 mg/dL 8.8(L) 9.2 8.9  Total Protein 6.5 -  8.1 g/dL 7.7 - -  Total Bilirubin 0.3 - 1.2 mg/dL 0.7 - -  Alkaline Phos 38 - 126 U/L 58 - -  AST 15 - 41 U/L 22 - -  ALT 0 - 44 U/L 22 - -    DIAGNOSTIC IMAGING:  I have independently reviewed the relevant imaging and discussed with the patient.  ASSESSMENT & PLAN: 1.  Normocytic anemia - Normocytic anemia secondary to iron deficiency and anemia of CKD - Colonoscopy on 01/11/2020 with diverticulosis in the sigmoid colon and the descending colon.  5 mm polyp in the ascending colon resected.  Nonbleeding internal hemorrhoids. - SPEP & UPEP checked by nephrologist was negative for monoclonal immunoglobulin or free light chain - No bright red blood per rectum or melena  - Currently taking ferrous sulfate 325 mg daily - Most recent labs (09/21/2021): Hgb 12.7/MCV 93.3.  Iron saturation 20% with ferritin 146 - PLAN: No indication for IV iron at this time.  Continue oral iron supplementation.  RTC in 6 months for repeat labs and phone visit.   2.  CKD stage III - Patient is established with nephrologist, baseline creatinine appears to be 1.3-1.8 - PLAN: Continue follow-up with nephrology (Dr. Theador Hawthorne)   3.  History of superficial thrombophlebitis of right lower extremity - Patient had 3-4 episodes of superficial phlebitis without extension to the deep veins, starting in 2009 - Patient was started on Coumadin due to his CVA, and has not had any episodes of recurrent phlebitis since starting Coumadin   PLAN SUMMARY & DISPOSITION: Labs and RTC in 6 months  All questions were answered. The patient knows to call the clinic with any problems, questions or concerns.  Medical decision making: Low  Time spent  on visit: I spent 15 minutes counseling the patient face to face. The total time spent in the appointment was 25 minutes and more than 50% was on counseling.   Harriett Rush, PA-C  09/28/2021 9:15 AM

## 2021-09-28 ENCOUNTER — Other Ambulatory Visit: Payer: Self-pay

## 2021-09-28 ENCOUNTER — Inpatient Hospital Stay (HOSPITAL_BASED_OUTPATIENT_CLINIC_OR_DEPARTMENT_OTHER): Payer: Medicare Other | Admitting: Physician Assistant

## 2021-09-28 VITALS — BP 119/69 | HR 89 | Temp 97.8°F | Resp 18 | Ht 72.0 in | Wt 196.9 lb

## 2021-09-28 DIAGNOSIS — D509 Iron deficiency anemia, unspecified: Secondary | ICD-10-CM | POA: Diagnosis not present

## 2021-09-28 DIAGNOSIS — D631 Anemia in chronic kidney disease: Secondary | ICD-10-CM

## 2021-09-28 DIAGNOSIS — N189 Chronic kidney disease, unspecified: Secondary | ICD-10-CM

## 2021-09-28 DIAGNOSIS — Z8673 Personal history of transient ischemic attack (TIA), and cerebral infarction without residual deficits: Secondary | ICD-10-CM | POA: Diagnosis not present

## 2021-09-28 DIAGNOSIS — N183 Chronic kidney disease, stage 3 unspecified: Secondary | ICD-10-CM | POA: Diagnosis not present

## 2021-09-28 DIAGNOSIS — Z87891 Personal history of nicotine dependence: Secondary | ICD-10-CM | POA: Diagnosis not present

## 2021-09-28 DIAGNOSIS — Z7901 Long term (current) use of anticoagulants: Secondary | ICD-10-CM | POA: Diagnosis not present

## 2021-09-28 NOTE — Patient Instructions (Signed)
[place] at [facility] Discharge Instructions  You were seen today by Tarri Abernethy PA-C for your iron deficiency anemia.  Your blood and iron counts looked great at today's visit!  Continue to take the iron pill once a day.  We will check your labs and see you for follow-up visit in 6 months.   Thank you for choosing [place] at [facility] to provide your oncology and hematology care.  To afford each patient quality time with our provider, please arrive at least 15 minutes before your scheduled appointment time.   If you have a lab appointment with the [facility] please come in thru the Main Entrance and check in at the main information desk.  You need to re-schedule your appointment should you arrive 10 or more minutes late.  We strive to give you quality time with our providers, and arriving late affects you and other patients whose appointments are after yours.  Also, if you no show three or more times for appointments you may be dismissed from the clinic at the providers discretion.     Again, thank you for choosing [facility].  Our hope is that these requests will decrease the amount of time that you wait before being seen by our physicians.       _____________________________________________________________  Should you have questions after your visit to [facility], please contact our office at [phone] and follow the prompts.  Our office hours are 8:00 a.m. and 4:30 p.m. Monday - Friday.  Please note that voicemails left after 4:00 p.m. may not be returned until the following business day.  We are closed weekends and major holidays.  You do have access to a nurse 24-7, just call the main number to the clinic [phone] and do not press any options, hold on the line and a nurse will answer the phone.    For prescription refill requests, have your pharmacy contact our office and allow 72 hours.     Due to Covid, you will need to wear a mask upon entering the hospital. If you do not have a mask, a mask will be given to you at the Main Entrance upon arrival. For doctor visits, patients may have 1 support person age 84 or older with them. For treatment visits, patients can not have anyone with them due to social distancing guidelines and our immunocompromised population.

## 2021-10-11 ENCOUNTER — Ambulatory Visit (INDEPENDENT_AMBULATORY_CARE_PROVIDER_SITE_OTHER): Payer: Medicare Other | Admitting: *Deleted

## 2021-10-11 ENCOUNTER — Other Ambulatory Visit: Payer: Self-pay

## 2021-10-11 DIAGNOSIS — I6350 Cerebral infarction due to unspecified occlusion or stenosis of unspecified cerebral artery: Secondary | ICD-10-CM | POA: Diagnosis not present

## 2021-10-11 DIAGNOSIS — Z5181 Encounter for therapeutic drug level monitoring: Secondary | ICD-10-CM | POA: Diagnosis not present

## 2021-10-11 DIAGNOSIS — I639 Cerebral infarction, unspecified: Secondary | ICD-10-CM | POA: Diagnosis not present

## 2021-10-11 DIAGNOSIS — Z7901 Long term (current) use of anticoagulants: Secondary | ICD-10-CM | POA: Diagnosis not present

## 2021-10-11 DIAGNOSIS — Z86718 Personal history of other venous thrombosis and embolism: Secondary | ICD-10-CM

## 2021-10-11 LAB — POCT INR: INR: 2.1 (ref 2.0–3.0)

## 2021-10-11 NOTE — Patient Instructions (Signed)
Continue warfarin 1 tablet daily except 1 1/2 tablets on Mondays, Wednesdays and Fridays Recheck in 6 weeks   

## 2021-10-23 ENCOUNTER — Telehealth: Payer: Self-pay | Admitting: Cardiology

## 2021-10-23 MED ORDER — CARVEDILOL 6.25 MG PO TABS: 6.2500 mg | ORAL_TABLET | Freq: Two times a day (BID) | ORAL | 1 refills | Status: DC

## 2021-10-23 MED ORDER — WARFARIN SODIUM 5 MG PO TABS: ORAL_TABLET | ORAL | 4 refills | Status: DC

## 2021-10-23 MED ORDER — FUROSEMIDE 40 MG PO TABS: 60.0000 mg | ORAL_TABLET | Freq: Every day | ORAL | 1 refills | Status: DC

## 2021-10-23 NOTE — Telephone Encounter (Signed)
Completed refill for Coreg/Lasix ?

## 2021-10-23 NOTE — Telephone Encounter (Signed)
Received request for warfarin refill: ?Last INR was 2.1 on 10/11/21 ?Next INR 11/22/21 ?Last OV 09/12/21  Myles Gip MD ? ?Refill approved ?

## 2021-10-23 NOTE — Addendum Note (Signed)
Addended by: Malen Gauze on: 10/23/2021 04:43 PM ? ? Modules accepted: Orders ? ?

## 2021-10-23 NOTE — Telephone Encounter (Signed)
?*  STAT* If patient is at the pharmacy, call can be transferred to refill team. ? ? ?1. Which medications need to be refilled? (please list name of each medication and dose if known)  ?carvedilol (COREG) 6.25 MG tablet ?furosemide (LASIX) 40 MG tablet ?warfarin (JANTOVEN) 5 MG tablet ? ?2. Which pharmacy/location (including street and city if local pharmacy) is medication to be sent to? [facility]  ? ?3. Do they need a 30 day or 90 day supply? 30 day  ?

## 2021-11-02 DIAGNOSIS — I10 Essential (primary) hypertension: Secondary | ICD-10-CM | POA: Diagnosis not present

## 2021-11-02 DIAGNOSIS — E785 Hyperlipidemia, unspecified: Secondary | ICD-10-CM | POA: Diagnosis not present

## 2021-11-05 NOTE — Progress Notes (Signed)
? ? ?Referring Provider: [name], MD ?Primary Care Physician:  [name], MD ?Primary GI Physician: Dr. Gala Romney ? ?Chief Complaint  ?Patient presents with  ? Follow-up  ? ? ?HPI:   ?Dennis Yu is a 76 y.o. male with history of  IDA, GERD, suspected perforated duodenal ulcer in June 2021 with recommendations for follow-up endoscopy in 6-8 weeks, but patient declined, abdominal pain, diarrhea, presenting today for follow-up.  ? ?Colonoscopy up to date in June 2021 with diverticular disease, single sessile serrated polyp without cytologic dysplasia and negative colon biopsies.  Recommended 5 year repeat if health permits.  ? ?Most recent labs (09/21/2021): Hgb 12.7 with normocytic indices.  Iron 67, iron saturation 20% , ferritin 146 ? ?Last seen in our office June 2022.  Reported he was doing well overall.  He was taking Protonix twice daily before meals and denied abdominal pain, diarrhea, or any other significant symptoms.  He also continued to decline EGD.  Recommended trying to reduce Protonix to once daily and continue to follow with Dr. Delton Coombes for history of anemia.  Follow-up in 6 months. ? ?Today:  ?No reflux symptoms on Protonix 40 mg daily. Denies abdominal pain, dysphagia, nausea, vomiting, constipation, diarrhea, brbpr, melena, unintentional weight loss.  ? ?Past Medical History:  ?Diagnosis Date  ? Bronchitis 11/2012  ? Cardiomyopathy   ? LVEF 25%  ? Erectile dysfunction   ? Essential hypertension   ? GERD (gastroesophageal reflux disease)   ? History of colonic polyps   ? Mixed hyperlipidemia   ? Mural thrombus of left ventricle   ? Obstructive sleep apnea   ? Patent foramen ovale   ? Perforated duodenal ulcer ([place]) 01/2020  ? recommended repeat EGD 6-8 week, but patient declined.  ? Phlebitis   ? Stroke [facility])   ? Embolic left temporal 5/40 s/p tPA  ? Superficial phlebitis 12/10/2012  ? Type 2 diabetes mellitus ([place])   ? Vitamin D deficiency   ? ? ?Past Surgical History:  ?Procedure  Laterality Date  ? BIOPSY  01/11/2020  ? Procedure: BIOPSY;  Surgeon: Daneil Dolin, MD;  [facility];  Service: Endoscopy;;  ? COLONOSCOPY WITH PROPOFOL N/A 01/11/2020  ? Surgeon: Daneil Dolin, MD; diverticular disease, single sessile serrated polyp without cytologic dysplasia and negative colon biopsies.  Recommended 5 year repeat if health permits.  ? POLYPECTOMY  01/11/2020  ? Procedure: POLYPECTOMY;  Surgeon: Daneil Dolin, MD;  [facility];  Service: Endoscopy;;  ? ? ?Current Outpatient Medications  ?Medication Sig Dispense Refill  ? aspirin 81 MG tablet Take 81 mg by mouth daily.    ? atorvastatin (LIPITOR) 40 MG tablet Take 40 mg by mouth daily.    ? carvedilol (COREG) 6.25 MG tablet Take 1 tablet (6.25 mg total) by mouth 2 (two) times daily with a meal. 30 tablet 1  ? FARXIGA 10 MG TABS tablet Take 10 mg by mouth daily.    ? Ferrous Sulfate (IRON SUPPLEMENT PO) Take by mouth daily.    ? furosemide (LASIX) 40 MG tablet Take 1.5 tablets (60 mg total) by mouth daily. 45 tablet 1  ? gabapentin (NEURONTIN) 300 MG capsule Take 3 capsules (900 mg total) by mouth daily. (Patient taking differently: Take 300-600 mg by mouth See admin instructions. 300 mg during the day, 600 mg at bedtime) 90 capsule 11  ? NON FORMULARY legatrin pm ?Daily    ? Omega-3 Fatty Acids (FISH OIL) 1000 MG  CAPS Take 1,000 mg by mouth daily.    ? pantoprazole (PROTONIX) 40 MG tablet TAKE 1 TABLET 1 TO 2 TIMES DAILY BEFORE MEALS FOR     REFLUX/ABDOMINAL PAIN (Patient taking differently: Take 40 mg by mouth daily.) 180 tablet 1  ? PROAIR HFA 108 (90 BASE) MCG/ACT inhaler Inhale 2 puffs into the lungs every 4 (four) hours as needed for wheezing or shortness of breath.     ? sacubitril-valsartan (ENTRESTO) 24-26 MG Take 1 tablet by mouth 2 (two) times daily. 180 tablet 1  ? warfarin (JANTOVEN) 5 MG tablet TAKE 1 TABLET DAILY EXCEPT 1 1/2 TABLETS ON MONDAYS, WEDNESDAYS AND FRIDAYS OR AS DIRECTED 120 tablet 4  ? ?No  current facility-administered medications for this visit.  ? ? ?Allergies as of 11/07/2021  ? (No Known Allergies)  ? ? ?Family History  ?Problem Relation Age of Onset  ? Diabetes Father   ? Hypertension Other   ? Colon cancer Neg Hx   ? ? ?Social History  ? ?Socioeconomic History  ? Marital status: Married  ?  Spouse name: Not on file  ? Number of children: Not on file  ? Years of education: Not on file  ? Highest education level: Not on file  ?Occupational History  ? Occupation: Truck Geophysicist/field seismologist  ?  Employer: RETIRED  ?Tobacco Use  ? Smoking status: Former  ?  Types: Cigarettes  ?  Start date: 08/06/1967  ?  Quit date: 08/06/2003  ?  Years since quitting: 18.2  ? Smokeless tobacco: Never  ?Vaping Use  ? Vaping Use: Never used  ?Substance and Sexual Activity  ? Alcohol use: No  ?  Alcohol/week: 0.0 standard drinks  ? Drug use: No  ? Sexual activity: Not Currently  ?Other Topics Concern  ? Not on file  ?Social History Narrative  ? Not on file  ? ?Social Determinants of Health  ? ?Financial Resource Strain: Not on file  ?Food Insecurity: Not on file  ?Transportation Needs: Not on file  ?Physical Activity: Not on file  ?Stress: Not on file  ?Social Connections: Not on file  ? ? ?Review of Systems: ?Gen: Denies fever, chills, cold or flulike symptoms, presyncope, syncope. ?CV: Denies chest pain, palpitations. ?Resp: Denies dyspnea or cough. ?GI: See HPI ?Heme: See HPI ? ?Physical Exam: ?BP 115/70   Pulse (!) 58   Temp (!) 97.5 ?F (36.4 ?C) (Temporal)   Ht 6' (1.829 m)   Wt 194 lb 3.2 oz (88.1 kg)   BMI 26.34 kg/m?  ?General:   Alert and oriented. No distress noted. Pleasant and cooperative.  ?Head:  Normocephalic and atraumatic. ?Eyes:  Conjuctiva clear without scleral icterus. ?Heart:  S1, S2 present without murmurs appreciated. ?Lungs:  Clear to auscultation bilaterally. No wheezes, rales, or rhonchi. No distress.  ?Abdomen:  +BS, soft, non-tender and non-distended. No rebound or guarding. No HSM or masses noted. ?Msk:   Symmetrical without gross deformities. Normal posture. ?Extremities:  Without edema. ?Neurologic:  Alert and  oriented x4 ?Psych:  Normal mood and affect. ? ? ? ?Assessment:  ?76 year old male with history of IDA, GERD, suspected perforated duodenal ulcer June 2021 with recommendations for follow-up endoscopy in 6/weeks, but patient has declined, history of abdominal pain, diarrhea, presenting today for routine follow-up.  ? ?GERD:  ?Well controlled on Protonix 40 mg daily. No alarm symptoms.  ? ?History of perforated duodenal ulcer: ?Suspected perforated duodenal ulcer June 2021 with recommendations for follow-up endoscopy in 6/weeks, but patient has  declined and continues to decline.  ? ?IDA:  ?Following with hematology. Colonoscopy on file 2021, due for surveillance 2026. Suspected perforated duodenal ulcer June 2021 with recommendations for follow-up EGD, not completed as patient has declined. On Protonix 40 mg daily and iron daily. Most recent labs 09/21/2021 with hemoglobin 12.7 with normocytic indices, iron 67, iron saturation 20%, ferritin 146.  No overt GI bleeding.  He will continue to follow with hematology. ? ? ?Plan:  ?Continue Protonix 40 mg daily. ?Continue to follow with hematology for IDA.  ?Follow-up in 1 year or sooner if needed.   ? ? ?Aliene Altes, PA-C ?[facility] ?11/07/2021  ?

## 2021-11-07 ENCOUNTER — Encounter: Payer: Self-pay | Admitting: Gastroenterology

## 2021-11-07 ENCOUNTER — Ambulatory Visit (INDEPENDENT_AMBULATORY_CARE_PROVIDER_SITE_OTHER): Payer: Medicare Other | Admitting: Gastroenterology

## 2021-11-07 VITALS — BP 115/70 | HR 58 | Temp 97.5°F | Ht 72.0 in | Wt 194.2 lb

## 2021-11-07 DIAGNOSIS — D509 Iron deficiency anemia, unspecified: Secondary | ICD-10-CM | POA: Diagnosis not present

## 2021-11-07 DIAGNOSIS — Z8719 Personal history of other diseases of the digestive system: Secondary | ICD-10-CM

## 2021-11-07 DIAGNOSIS — K219 Gastro-esophageal reflux disease without esophagitis: Secondary | ICD-10-CM

## 2021-11-07 NOTE — Patient Instructions (Signed)
Continue Protonix 40 mg daily 30 minutes before breakfast.  ? ?Continue to follow with hematology for you IDA.  ? ?We will see you back in 1 year. Do not hesitate to call with any questions or concerns prior to your next visit.  ? ?It was very nice meeting you today! Have a Happy Easter!  ? ?Aliene Altes, PA-C ?[facility] ? ? ? ?

## 2021-11-22 ENCOUNTER — Ambulatory Visit (INDEPENDENT_AMBULATORY_CARE_PROVIDER_SITE_OTHER): Payer: Medicare Other | Admitting: *Deleted

## 2021-11-22 DIAGNOSIS — Z7901 Long term (current) use of anticoagulants: Secondary | ICD-10-CM

## 2021-11-22 DIAGNOSIS — I639 Cerebral infarction, unspecified: Secondary | ICD-10-CM | POA: Diagnosis not present

## 2021-11-22 DIAGNOSIS — Z5181 Encounter for therapeutic drug level monitoring: Secondary | ICD-10-CM | POA: Diagnosis not present

## 2021-11-22 DIAGNOSIS — I6350 Cerebral infarction due to unspecified occlusion or stenosis of unspecified cerebral artery: Secondary | ICD-10-CM | POA: Diagnosis not present

## 2021-11-22 DIAGNOSIS — Z86718 Personal history of other venous thrombosis and embolism: Secondary | ICD-10-CM | POA: Diagnosis not present

## 2021-11-22 LAB — POCT INR: INR: 2.3 (ref 2.0–3.0)

## 2021-11-22 NOTE — Patient Instructions (Signed)
Continue warfarin 1 tablet daily except 1 1/2 tablets on Mondays, Wednesdays and Fridays Recheck in 6 weeks   

## 2021-11-29 DIAGNOSIS — E1142 Type 2 diabetes mellitus with diabetic polyneuropathy: Secondary | ICD-10-CM | POA: Diagnosis not present

## 2021-11-29 DIAGNOSIS — B351 Tinea unguium: Secondary | ICD-10-CM | POA: Diagnosis not present

## 2021-12-03 NOTE — Progress Notes (Signed)
? ?Cardiology Office Note   ? ?Date:  12/11/2021  ? ?ID:  Dennis Yu, DOB 05-12-1946, MRN [id number] ? ? ?PCP:  [name], MD ?  ?[place]  ?Cardiologist:  Rozann Lesches, MD   ?Advanced Practice Provider:  No care team member to display ?Electrophysiologist:  None  ? ?[id number]}  ? ?Chief Complaint  ?Patient presents with  ? Follow-up  ? ? ?History of Present Illness:  ?Dennis Yu is a 76 y.o. 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 ? ?Patient last saw Dr. Domenic Polite 09/12/2021 at which time he was having trouble getting his medications filled. ? ?Patient comes in for f/u. Has been able to get meds now-getting assistance with entresto and farxiga. Denies chest pain, dyspnea, edema, palpitations. Walks slow but stays active in his yard, garden, cuts firewood. ? ? ? ?Past Medical History:  ?Diagnosis Date  ? Bronchitis 11/2012  ? Cardiomyopathy   ? LVEF 25%  ? Erectile dysfunction   ? Essential hypertension   ? GERD (gastroesophageal reflux disease)   ? History of colonic polyps   ? Mixed hyperlipidemia   ? Mural thrombus of left ventricle   ? Obstructive sleep apnea   ? Patent foramen ovale   ? Perforated duodenal ulcer ([place]) 01/2020  ? recommended repeat EGD 6-8 week, but patient declined.  ? Phlebitis   ? Stroke [facility])   ? 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 Superficial phlebitis 12/10/2012  ? Type 2 diabetes mellitus ([place])   ? Vitamin D deficiency   ? ? ?Past Surgical History:  ?Procedure Laterality Date  ? BIOPSY  01/11/2020  ? Procedure: BIOPSY;  Surgeon: Daneil Dolin, MD;  [facility];  Service: Endoscopy;;  ? COLONOSCOPY WITH PROPOFOL N/A 01/11/2020  ? Surgeon: Daneil Dolin, MD; diverticular disease, single sessile serrated polyp without cytologic dysplasia and negative colon biopsies.  Recommended 5 year repeat if health permits.   ? POLYPECTOMY  01/11/2020  ? Procedure: POLYPECTOMY;  Surgeon: Daneil Dolin, MD;  Location: [facility];  Service: Endoscopy;;  ? ? ?Current Medications: ?Current Meds  ?Medication Sig  ? aspirin 81 MG tablet Take 81 mg by mouth daily.  ? atorvastatin (LIPITOR) 40 MG tablet Take 40 mg by mouth daily.  ? carvedilol (COREG) 6.25 MG tablet TAKE ONE TABLET BY MOUTH TWICE DAILY with meals  ? FARXIGA 10 MG TABS tablet Take 10 mg by mouth daily.  ? Ferrous Sulfate (IRON SUPPLEMENT PO) Take by mouth daily.  ? furosemide (LASIX) 40 MG tablet Take 1.5 tablets (60 mg total) by mouth daily. (Patient taking differently: Take 40 mg by mouth daily.)  ? gabapentin (NEURONTIN) 300 MG capsule Take 3 capsules (900 mg total) by mouth daily. (Patient taking differently: Take 300-600 mg by mouth See admin instructions. 300 mg during the day, 600 mg at bedtime)  ? NON FORMULARY legatrin pm ?Daily  ? Omega-3 Fatty Acids (FISH OIL) 1000 MG CAPS Take 1,000 mg by mouth daily.  ? pantoprazole (PROTONIX) 40 MG tablet TAKE 1 TABLET 1 TO 2 TIMES DAILY BEFORE MEALS FOR     REFLUX/ABDOMINAL PAIN (Patient taking differently: Take 40 mg by mouth daily.)  ? PROAIR HFA 108 (90 BASE) MCG/ACT inhaler Inhale 2 puffs into the lungs every 4 (four) hours as needed for wheezing or shortness of breath.   ? sacubitril-valsartan (ENTRESTO)  24-26 MG Take 1 tablet by mouth 2 (two) times daily.  ? warfarin (JANTOVEN) 5 MG tablet TAKE 1 TABLET DAILY EXCEPT 1 1/2 TABLETS ON MONDAYS, WEDNESDAYS AND FRIDAYS OR AS DIRECTED  ?  ? ?Allergies:   Patient has no known allergies.  ? ?Social History  ? ?Socioeconomic History  ? Marital status: Married  ?  Spouse name: Not on file  ? Number of children: Not on file  ? Years of education: Not on file  ? Highest education level: Not on file  ?Occupational History  ? Occupation: Truck Geophysicist/field seismologist  ?  Employer: RETIRED  ?Tobacco Use  ? Smoking status: Former  ?  Types: Cigarettes  ?  Start date: 08/06/1967  ?  Quit date: 08/06/2003  ?   Years since quitting: 18.3  ? Smokeless tobacco: Never  ?Vaping Use  ? Vaping Use: Never used  ?Substance and Sexual Activity  ? Alcohol use: No  ?  Alcohol/week: 0.0 standard drinks  ? Drug use: No  ? Sexual activity: Not Currently  ?Other Topics Concern  ? Not on file  ?Social History Narrative  ? Not on file  ? ?Social Determinants of Health  ? ?Financial Resource Strain: Not on file  ?Food Insecurity: Not on file  ?Transportation Needs: Not on file  ?Physical Activity: Not on file  ?Stress: Not on file  ?Social Connections: Not on file  ?  ? ?Family History:  The patient's  family history includes Diabetes in his father; Hypertension in an other family member.  ? ?ROS:   ?Please see the history of present illness.    ?ROS All other systems reviewed and are negative. ? ? ?PHYSICAL EXAM:   ?VS:  BP (!) 110/58   Pulse 63   Ht 6' (1.829 m)   Wt 193 lb (87.5 kg)   SpO2 95%   BMI 26.18 kg/m?   ?Physical Exam  ?GEN: Well nourished, well developed, in no acute distress  ?Neck: no JVD, carotid bruits, or masses ?Cardiac:RRR; no murmurs, rubs, or gallops  ?Respiratory:  clear to auscultation bilaterally, normal work of breathing ?GI: soft, nontender, nondistended, + BS ?Ext: without cyanosis, clubbing, or edema, Good distal pulses bilaterally ?Neuro:  Alert and Oriented x 3,  ?Psych: euthymic mood, full affect ? ?Wt Readings from Last 3 Encounters:  ?12/11/21 193 lb (87.5 kg)  ?11/07/21 194 lb 3.2 oz (88.1 kg)  ?09/28/21 196 lb 14.4 oz (89.3 kg)  ?  ? ? ?Studies/Labs Reviewed:  ? ?EKG:  EKG is not ordered today.    ? ?Recent Labs: ?09/21/2021: Hemoglobin 12.7; Platelets 193  ? ?Lipid Panel ?   ?Component Value Date/Time  ? CHOL 145 04/05/2011 0545  ? TRIG 143 01/17/2020 0600  ? HDL 40 04/05/2011 0545  ? CHOLHDL 3.6 04/05/2011 0545  ? VLDL 13 04/05/2011 0545  ? [place] 92 04/05/2011 0545  ? ? ?Additional studies/ records that were reviewed today include:  ?Echocardiogram 02/23/2020: ? 1. Left ventricular ejection  fraction, by estimation, is approximately  ?25%. The left ventricle has severely decreased function. The left  ?ventricle demonstrates regional wall motion abnormalities (see scoring  ?diagram/findings for description). The left  ?ventricular internal cavity size was mildly dilated. Left ventricular  ?diastolic parameters are indeterminate.  ? 2. Calcified apical LV mural thrombus present. Also prominent apical  ?trabeculation.  ? 3. Right ventricular systolic function is normal. The right ventricular  ?size is normal. Tricuspid regurgitation signal is inadequate for assessing  ?PA pressure.  ?  4. Left atrial size was mildly dilated.  ? 5. The mitral valve is abnormal, mild to moderate annular calcification  ?and restricted posterior leaflet motion. Moderate mitral valve  ?regurgitation.  ? 6. The aortic valve is tricuspid, moderate annular calcification. Aortic  ?valve regurgitation is not visualized.  ? 7. The inferior vena cava is normal in size with greater than 50%  ?respiratory variability, suggesting right atrial pressure of 3 mmHg.  ?  ? ? ?Risk Assessment/Calculations:   ?  ? ? ? ? ?ASSESSMENT:   ? ?1. Ischemic cardiomyopathy   ?2. Chronic systolic heart failure ([place])   ?3. Medication management   ?4. Stage 3b chronic kidney disease ([place])   ?5. Mural thrombus of left ventricle   ? ? ? ?PLAN:  ?In order of problems listed above: ? ?Ischemic cardiomyopathy ejection fraction 25% conservative management and have not pursued cardiac catheter ICD on Coreg Entresto and Lasix.  No Aldactone or SGLT2 inhibitor with fluctuating renal insufficiency. No chest pain or dyspnea. Stays quite active and compensated today. Check bmet and cbc ? ?Chronic systolic CHF compensated ? ?CKD stage IIIb followed by Dr. Theador Hawthorne ? 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 ?Shared Decision Making/Informed Consent   ?   ? ? ?Medication Adjustments/Labs and Tests Ordered: ?Current  medicines are reviewed at length with the patient today.  Concerns regarding medicines are outlined above.  Medication changes, Labs and Tests ordered today are listed in the Patient Instructions below. ?Patient I

## 2021-12-10 ENCOUNTER — Other Ambulatory Visit: Payer: Self-pay | Admitting: Cardiology

## 2021-12-11 ENCOUNTER — Other Ambulatory Visit: Payer: Self-pay | Admitting: *Deleted

## 2021-12-11 ENCOUNTER — Ambulatory Visit (INDEPENDENT_AMBULATORY_CARE_PROVIDER_SITE_OTHER): Payer: Medicare Other | Admitting: Physician Assistant

## 2021-12-11 ENCOUNTER — Encounter: Payer: Self-pay | Admitting: Physician Assistant

## 2021-12-11 ENCOUNTER — Other Ambulatory Visit (HOSPITAL_COMMUNITY)
Admission: RE | Admit: 2021-12-11 | Discharge: 2021-12-11 | Disposition: A | Discharge status: Home / Self Care | Payer: Medicare Other | Source: Ambulatory Visit | Attending: Physician Assistant | Admitting: Physician Assistant

## 2021-12-11 VITALS — BP 110/58 | HR 63 | Ht 72.0 in | Wt 193.0 lb

## 2021-12-11 DIAGNOSIS — I513 Intracardiac thrombosis, not elsewhere classified: Secondary | ICD-10-CM

## 2021-12-11 DIAGNOSIS — Z79899 Other long term (current) drug therapy: Secondary | ICD-10-CM | POA: Diagnosis not present

## 2021-12-11 DIAGNOSIS — I6350 Cerebral infarction due to unspecified occlusion or stenosis of unspecified cerebral artery: Secondary | ICD-10-CM

## 2021-12-11 DIAGNOSIS — I5022 Chronic systolic (congestive) heart failure: Secondary | ICD-10-CM | POA: Diagnosis not present

## 2021-12-11 DIAGNOSIS — N1832 Chronic kidney disease, stage 3b: Secondary | ICD-10-CM

## 2021-12-11 DIAGNOSIS — I255 Ischemic cardiomyopathy: Secondary | ICD-10-CM | POA: Diagnosis not present

## 2021-12-11 LAB — CBC
HCT: 44.8 % (ref 39.0–52.0)
Hemoglobin: 13.2 g/dL (ref 13.0–17.0)
MCH: 26.8 pg (ref 26.0–34.0)
MCHC: 29.5 g/dL — ABNORMAL LOW (ref 30.0–36.0)
MCV: 91.1 fL (ref 80.0–100.0)
Platelets: 190 10*3/uL (ref 150–400)
RBC: 4.92 MIL/uL (ref 4.22–5.81)
RDW: 13 % (ref 11.5–15.5)
WBC: 6.1 10*3/uL (ref 4.0–10.5)
nRBC: 0 % (ref 0.0–0.2)

## 2021-12-11 LAB — BASIC METABOLIC PANEL
Anion gap: 6 (ref 5–15)
BUN: 21 mg/dL (ref 8–23)
CO2: 23 mmol/L (ref 22–32)
Calcium: 8.7 mg/dL — ABNORMAL LOW (ref 8.9–10.3)
Chloride: 109 mmol/L (ref 98–111)
Creatinine, Ser: 1.38 mg/dL — ABNORMAL HIGH (ref 0.61–1.24)
GFR, Estimated: 54 mL/min — ABNORMAL LOW (ref >60)
Glucose, Bld: 216 mg/dL — ABNORMAL HIGH (ref 70–99)
Potassium: 4.5 mmol/L (ref 3.5–5.1)
Sodium: 138 mmol/L (ref 135–145)

## 2021-12-11 MED ORDER — CARVEDILOL 6.25 MG PO TABS: 6.2500 mg | ORAL_TABLET | Freq: Two times a day (BID) | ORAL | 3 refills | Status: DC

## 2021-12-11 NOTE — Patient Instructions (Signed)
Medication Instructions:  ?Your physician recommends that you continue on your current medications as directed. Please refer to the Current Medication list given to you today. ? ?*If you need a refill on your cardiac medications before your next appointment, please call your pharmacy* ? ? ?Lab Work: ?Your physician recommends that you return for lab work today. BMET, CBC  ? ?If you have labs (blood work) drawn today and your tests are completely normal, you will receive your results only by: ?MyChart Message (if you have MyChart) OR ?A paper copy in the mail ?If you have any lab test that is abnormal or we need to change your treatment, we will call you to review the results. ? ? ?Testing/Procedures: ?NONE  ? ? ?Follow-Up: ?At [facility], you and your health needs are our priority.  As part of our continuing mission to provide you with exceptional heart care, we have created designated Provider Care Teams.  These Care Teams include your primary Cardiologist (physician) and Advanced Practice Providers (APPs -  Physician Assistants and Nurse Practitioners) who all work together to provide you with the care you need, when you need it. ? ?We recommend signing up for the patient portal called "MyChart".  Sign up information is provided on this After Visit Summary.  MyChart is used to connect with patients for Virtual Visits (Telemedicine).  Patients are able to view lab/test results, encounter notes, upcoming appointments, etc.  Non-urgent messages can be sent to your provider as well.   ?To learn more about what you can do with MyChart, go to [email or web address].   ? ?Your next appointment:   ?6 month(s) ? ?The format for your next appointment:   ?In Person ? ?Provider:   ?Rozann Lesches, MD  ? ? ?Other Instructions ?Thank you for choosing [facility]! ? ? ? ?Important Information About Sugar ? ? ? ? ? ? ?

## 2021-12-19 DIAGNOSIS — E875 Hyperkalemia: Secondary | ICD-10-CM | POA: Diagnosis not present

## 2021-12-19 DIAGNOSIS — I5042 Chronic combined systolic (congestive) and diastolic (congestive) heart failure: Secondary | ICD-10-CM | POA: Diagnosis not present

## 2021-12-19 DIAGNOSIS — I9589 Other hypotension: Secondary | ICD-10-CM | POA: Diagnosis not present

## 2021-12-19 DIAGNOSIS — E1122 Type 2 diabetes mellitus with diabetic chronic kidney disease: Secondary | ICD-10-CM | POA: Diagnosis not present

## 2021-12-19 DIAGNOSIS — N189 Chronic kidney disease, unspecified: Secondary | ICD-10-CM | POA: Diagnosis not present

## 2021-12-20 DIAGNOSIS — E1122 Type 2 diabetes mellitus with diabetic chronic kidney disease: Secondary | ICD-10-CM | POA: Diagnosis not present

## 2021-12-20 DIAGNOSIS — D509 Iron deficiency anemia, unspecified: Secondary | ICD-10-CM | POA: Diagnosis not present

## 2021-12-20 DIAGNOSIS — E782 Mixed hyperlipidemia: Secondary | ICD-10-CM | POA: Diagnosis not present

## 2021-12-27 DIAGNOSIS — E782 Mixed hyperlipidemia: Secondary | ICD-10-CM | POA: Diagnosis not present

## 2021-12-27 DIAGNOSIS — Z0001 Encounter for general adult medical examination with abnormal findings: Secondary | ICD-10-CM | POA: Diagnosis not present

## 2021-12-27 DIAGNOSIS — Z8673 Personal history of transient ischemic attack (TIA), and cerebral infarction without residual deficits: Secondary | ICD-10-CM | POA: Diagnosis not present

## 2021-12-27 DIAGNOSIS — G4733 Obstructive sleep apnea (adult) (pediatric): Secondary | ICD-10-CM | POA: Diagnosis not present

## 2021-12-27 DIAGNOSIS — I482 Chronic atrial fibrillation, unspecified: Secondary | ICD-10-CM | POA: Diagnosis not present

## 2021-12-27 DIAGNOSIS — I129 Hypertensive chronic kidney disease with stage 1 through stage 4 chronic kidney disease, or unspecified chronic kidney disease: Secondary | ICD-10-CM | POA: Diagnosis not present

## 2021-12-27 DIAGNOSIS — E663 Overweight: Secondary | ICD-10-CM | POA: Diagnosis not present

## 2021-12-27 DIAGNOSIS — I5022 Chronic systolic (congestive) heart failure: Secondary | ICD-10-CM | POA: Diagnosis not present

## 2021-12-27 DIAGNOSIS — N1831 Chronic kidney disease, stage 3a: Secondary | ICD-10-CM | POA: Diagnosis not present

## 2021-12-27 DIAGNOSIS — G9009 Other idiopathic peripheral autonomic neuropathy: Secondary | ICD-10-CM | POA: Diagnosis not present

## 2021-12-27 DIAGNOSIS — D509 Iron deficiency anemia, unspecified: Secondary | ICD-10-CM | POA: Diagnosis not present

## 2021-12-27 DIAGNOSIS — E1122 Type 2 diabetes mellitus with diabetic chronic kidney disease: Secondary | ICD-10-CM | POA: Diagnosis not present

## 2022-01-03 ENCOUNTER — Ambulatory Visit (INDEPENDENT_AMBULATORY_CARE_PROVIDER_SITE_OTHER): Payer: Medicare Other | Admitting: *Deleted

## 2022-01-03 DIAGNOSIS — I639 Cerebral infarction, unspecified: Secondary | ICD-10-CM | POA: Diagnosis not present

## 2022-01-03 DIAGNOSIS — Z5181 Encounter for therapeutic drug level monitoring: Secondary | ICD-10-CM

## 2022-01-03 DIAGNOSIS — I6350 Cerebral infarction due to unspecified occlusion or stenosis of unspecified cerebral artery: Secondary | ICD-10-CM

## 2022-01-03 DIAGNOSIS — Z7901 Long term (current) use of anticoagulants: Secondary | ICD-10-CM

## 2022-01-03 DIAGNOSIS — Z86718 Personal history of other venous thrombosis and embolism: Secondary | ICD-10-CM | POA: Diagnosis not present

## 2022-01-03 LAB — POCT INR: INR: 2.4 (ref 2.0–3.0)

## 2022-01-03 NOTE — Patient Instructions (Signed)
Continue warfarin 1 tablet daily except 1 1/2 tablets on Mondays, Wednesdays and Fridays Recheck in 6 weeks   

## 2022-02-14 ENCOUNTER — Ambulatory Visit (INDEPENDENT_AMBULATORY_CARE_PROVIDER_SITE_OTHER): Payer: Medicare Other | Admitting: *Deleted

## 2022-02-14 DIAGNOSIS — E1142 Type 2 diabetes mellitus with diabetic polyneuropathy: Secondary | ICD-10-CM | POA: Diagnosis not present

## 2022-02-14 DIAGNOSIS — Z86718 Personal history of other venous thrombosis and embolism: Secondary | ICD-10-CM

## 2022-02-14 DIAGNOSIS — Z7901 Long term (current) use of anticoagulants: Secondary | ICD-10-CM | POA: Diagnosis not present

## 2022-02-14 DIAGNOSIS — B351 Tinea unguium: Secondary | ICD-10-CM | POA: Diagnosis not present

## 2022-02-14 DIAGNOSIS — Z5181 Encounter for therapeutic drug level monitoring: Secondary | ICD-10-CM

## 2022-02-14 DIAGNOSIS — I639 Cerebral infarction, unspecified: Secondary | ICD-10-CM

## 2022-02-14 DIAGNOSIS — I6350 Cerebral infarction due to unspecified occlusion or stenosis of unspecified cerebral artery: Secondary | ICD-10-CM

## 2022-02-14 LAB — POCT INR: INR: 2.4 (ref 2.0–3.0)

## 2022-02-14 NOTE — Patient Instructions (Signed)
Continue warfarin 1 tablet daily except 1 1/2 tablets on Mondays, Wednesdays and Fridays Recheck in 6 weeks   

## 2022-03-15 ENCOUNTER — Other Ambulatory Visit: Payer: Self-pay | Admitting: Gastroenterology

## 2022-03-21 ENCOUNTER — Inpatient Hospital Stay: Payer: Medicare Other | Attending: Physician Assistant

## 2022-03-21 DIAGNOSIS — D631 Anemia in chronic kidney disease: Secondary | ICD-10-CM | POA: Diagnosis not present

## 2022-03-21 DIAGNOSIS — N183 Chronic kidney disease, stage 3 unspecified: Secondary | ICD-10-CM | POA: Insufficient documentation

## 2022-03-21 DIAGNOSIS — Z7984 Long term (current) use of oral hypoglycemic drugs: Secondary | ICD-10-CM | POA: Diagnosis not present

## 2022-03-21 DIAGNOSIS — Z79899 Other long term (current) drug therapy: Secondary | ICD-10-CM | POA: Diagnosis not present

## 2022-03-21 DIAGNOSIS — Z7901 Long term (current) use of anticoagulants: Secondary | ICD-10-CM | POA: Insufficient documentation

## 2022-03-21 DIAGNOSIS — Z7982 Long term (current) use of aspirin: Secondary | ICD-10-CM | POA: Diagnosis not present

## 2022-03-21 DIAGNOSIS — D509 Iron deficiency anemia, unspecified: Secondary | ICD-10-CM | POA: Insufficient documentation

## 2022-03-21 DIAGNOSIS — Z8673 Personal history of transient ischemic attack (TIA), and cerebral infarction without residual deficits: Secondary | ICD-10-CM | POA: Insufficient documentation

## 2022-03-21 LAB — COMPREHENSIVE METABOLIC PANEL
ALT: 35 U/L (ref 0–44)
AST: 28 U/L (ref 15–41)
Albumin: 3.8 g/dL (ref 3.5–5.0)
Alkaline Phosphatase: 62 U/L (ref 38–126)
Anion gap: 4 — ABNORMAL LOW (ref 5–15)
BUN: 17 mg/dL (ref 8–23)
CO2: 27 mmol/L (ref 22–32)
Calcium: 8.7 mg/dL — ABNORMAL LOW (ref 8.9–10.3)
Chloride: 111 mmol/L (ref 98–111)
Creatinine, Ser: 1.38 mg/dL — ABNORMAL HIGH (ref 0.61–1.24)
GFR, Estimated: 54 mL/min — ABNORMAL LOW (ref >60)
Glucose, Bld: 138 mg/dL — ABNORMAL HIGH (ref 70–99)
Potassium: 4.3 mmol/L (ref 3.5–5.1)
Sodium: 142 mmol/L (ref 135–145)
Total Bilirubin: 0.6 mg/dL (ref 0.3–1.2)
Total Protein: 7.3 g/dL (ref 6.5–8.1)

## 2022-03-21 LAB — CBC WITH DIFFERENTIAL/PLATELET
Abs Immature Granulocytes: 0.01 10*3/uL (ref 0.00–0.07)
Basophils Absolute: 0.1 10*3/uL (ref 0.0–0.1)
Basophils Relative: 1 %
Eosinophils Absolute: 0.2 10*3/uL (ref 0.0–0.5)
Eosinophils Relative: 4 %
HCT: 45.4 % (ref 39.0–52.0)
Hemoglobin: 13.9 g/dL (ref 13.0–17.0)
Immature Granulocytes: 0 %
Lymphocytes Relative: 21 %
Lymphs Abs: 1.2 10*3/uL (ref 0.7–4.0)
MCH: 27.8 pg (ref 26.0–34.0)
MCHC: 30.6 g/dL (ref 30.0–36.0)
MCV: 90.8 fL (ref 80.0–100.0)
Monocytes Absolute: 0.5 10*3/uL (ref 0.1–1.0)
Monocytes Relative: 8 %
Neutro Abs: 3.9 10*3/uL (ref 1.7–7.7)
Neutrophils Relative %: 66 %
Platelets: 175 10*3/uL (ref 150–400)
RBC: 5 MIL/uL (ref 4.22–5.81)
RDW: 13.2 % (ref 11.5–15.5)
WBC: 5.9 10*3/uL (ref 4.0–10.5)
nRBC: 0 % (ref 0.0–0.2)

## 2022-03-21 LAB — IRON AND TIBC
Iron: 69 ug/dL (ref 45–182)
Saturation Ratios: 21 % (ref 17.9–39.5)
TIBC: 329 ug/dL (ref 250–450)
UIBC: 260 ug/dL

## 2022-03-21 LAB — FERRITIN: Ferritin: 132 ng/mL (ref 24–336)

## 2022-03-27 DIAGNOSIS — I129 Hypertensive chronic kidney disease with stage 1 through stage 4 chronic kidney disease, or unspecified chronic kidney disease: Secondary | ICD-10-CM | POA: Diagnosis not present

## 2022-03-27 DIAGNOSIS — I5042 Chronic combined systolic (congestive) and diastolic (congestive) heart failure: Secondary | ICD-10-CM | POA: Diagnosis not present

## 2022-03-27 DIAGNOSIS — N189 Chronic kidney disease, unspecified: Secondary | ICD-10-CM | POA: Diagnosis not present

## 2022-03-27 DIAGNOSIS — E875 Hyperkalemia: Secondary | ICD-10-CM | POA: Diagnosis not present

## 2022-03-27 DIAGNOSIS — E1122 Type 2 diabetes mellitus with diabetic chronic kidney disease: Secondary | ICD-10-CM | POA: Diagnosis not present

## 2022-03-27 NOTE — Progress Notes (Unsigned)
[place], [place] [id number]   CLINIC:  Medical Oncology/Hematology  PCP:  [name], MD [address] [place] [address] [phone]   REASON FOR VISIT:  Follow-up for iron deficiency anemia and anemia of chronic kidney disease   PRIOR THERAPY: None   CURRENT THERAPY: Iron tablet daily   INTERVAL HISTORY:  Dennis Yu 76 y.o. male returns for routine follow-up of his iron deficiency anemia.  He was last seen by Tarri Abernethy PA-C on 09/28/2021.  At today's visit, he reports feeling well. No recent hospitalizations, surgeries, or changes in baseline health status.   His only complaint today is chronic fatigue, which he reports is at baseline.  He remains on Coumadin due to his history of CVA, denies having any bleeding episodes.  No epistaxis, hematemesis, hematochezia, or melena.  He denies any pica, headaches, chest pain, dyspnea, lightheadedness, or syncope.  He continues to take iron pill daily without any significant adverse effects.  He has chronic cough and SOB related to chronic bronchitis.  He has 50% energy and 100% appetite. He endorses that he is maintaining a stable weight.   REVIEW OF SYSTEMS:  Review of Systems  Constitutional:  Positive for fatigue. Negative for appetite change, chills, diaphoresis, fever and unexpected weight change.  HENT:   Negative for lump/mass and nosebleeds.   Eyes:  Negative for eye problems.  Respiratory:  Positive for cough and shortness of breath. Negative for hemoptysis.   Cardiovascular:  Negative for chest pain, leg swelling and palpitations.  Gastrointestinal:  Negative for abdominal pain, blood in stool, constipation, diarrhea, nausea and vomiting.  Genitourinary:  Negative for hematuria.   Skin: Negative.   Neurological:  Negative for dizziness, headaches and light-headedness.  Hematological:  Does not bruise/bleed easily.      PAST MEDICAL/SURGICAL HISTORY:  Past Medical  History:  Diagnosis Date   Bronchitis 11/2012   Cardiomyopathy    LVEF 25%   Erectile dysfunction    Essential hypertension    GERD (gastroesophageal reflux disease)    History of colonic polyps    Mixed hyperlipidemia    Mural thrombus of left ventricle    Obstructive sleep apnea    Patent foramen ovale    Perforated duodenal ulcer ([place]) 01/2020   recommended repeat EGD 6-8 week, but patient declined.   Phlebitis    Stroke [facility])    Embolic left temporal 6/96 s/p tPA   Superficial phlebitis 12/10/2012   Type 2 diabetes mellitus ([place])    Vitamin D deficiency    Past Surgical History:  Procedure Laterality Date   BIOPSY  01/11/2020   Procedure: BIOPSY;  Surgeon: Daneil Dolin, MD;  [facility];  Service: Endoscopy;;   COLONOSCOPY WITH PROPOFOL N/A 01/11/2020   Surgeon: Daneil Dolin, MD; diverticular disease, single sessile serrated polyp without cytologic dysplasia and negative colon biopsies.  Recommended 5 year repeat if health permits.   POLYPECTOMY  01/11/2020   Procedure: POLYPECTOMY;  Surgeon: Daneil Dolin, MD;  [facility];  Service: Endoscopy;;     SOCIAL HISTORY:  Social History   Socioeconomic History   Marital status: Married    Spouse name: Not on file   Number of children: Not on file   Years of education: Not on file   Highest education level: Not on file  Occupational History   Occupation: Truck driver    Employer: RETIRED  Tobacco Use  Smoking status: Former    Types: Cigarettes    Start date: 08/06/1967    Quit date: 08/06/2003    Years since quitting: 18.6   Smokeless tobacco: Never  Vaping Use   Vaping Use: Never used  Substance and Sexual Activity   Alcohol use: No    Alcohol/week: 0.0 standard drinks of alcohol   Drug use: No   Sexual activity: Not Currently  Other Topics Concern   Not on file  Social History Narrative   Not on file   Social Determinants of Health   Financial Resource Strain: Not on file   Food Insecurity: Not on file  Transportation Needs: Not on file  Physical Activity: Not on file  Stress: Not on file  Social Connections: Not on file  Intimate Partner Violence: Not At Risk (09/08/2020)   Humiliation, Afraid, Rape, and Kick questionnaire    Fear of Current or Ex-Partner: No    Emotionally Abused: No    Physically Abused: No    Sexually Abused: No    FAMILY HISTORY:  Family History  Problem Relation Age of Onset   Diabetes Father    Hypertension Other    Colon cancer Neg Hx     CURRENT MEDICATIONS:  Outpatient Encounter Medications as of 03/28/2022  Medication Sig   aspirin 81 MG tablet Take 81 mg by mouth daily.   atorvastatin (LIPITOR) 40 MG tablet Take 40 mg by mouth daily.   carvedilol (COREG) 6.25 MG tablet Take 1 tablet (6.25 mg total) by mouth 2 (two) times daily with a meal.   FARXIGA 10 MG TABS tablet Take 10 mg by mouth daily.   Ferrous Sulfate (IRON SUPPLEMENT PO) Take by mouth daily.   furosemide (LASIX) 40 MG tablet Take 1.5 tablets (60 mg total) by mouth daily. (Patient taking differently: Take 40 mg by mouth daily.)   gabapentin (NEURONTIN) 300 MG capsule Take 3 capsules (900 mg total) by mouth daily. (Patient taking differently: Take 300-600 mg by mouth See admin instructions. 300 mg during the day, 600 mg at bedtime)   NON FORMULARY legatrin pm Daily   Omega-3 Fatty Acids (FISH OIL) 1000 MG CAPS Take 1,000 mg by mouth daily.   pantoprazole (PROTONIX) 40 MG tablet Take 1 tablet (40 mg total) by mouth daily.   PROAIR HFA 108 (90 BASE) MCG/ACT inhaler Inhale 2 puffs into the lungs every 4 (four) hours as needed for wheezing or shortness of breath.    sacubitril-valsartan (ENTRESTO) 24-26 MG Take 1 tablet by mouth 2 (two) times daily.   warfarin (JANTOVEN) 5 MG tablet TAKE 1 TABLET DAILY EXCEPT 1 1/2 TABLETS ON MONDAYS, WEDNESDAYS AND FRIDAYS OR AS DIRECTED   No facility-administered encounter medications on file as of 03/28/2022.    ALLERGIES:   No Known Allergies   PHYSICAL EXAM:  ECOG PERFORMANCE STATUS: 1 - Symptomatic but completely ambulatory  There were no vitals filed for this visit. There were no vitals filed for this visit. Physical Exam Constitutional:      Appearance: Normal appearance.  HENT:     Head: Normocephalic and atraumatic.     Mouth/Throat:     Mouth: Mucous membranes are moist.  Eyes:     Extraocular Movements: Extraocular movements intact.     Pupils: Pupils are equal, round, and reactive to light.  Cardiovascular:     Rate and Rhythm: Normal rate and regular rhythm.     Pulses: Normal pulses.     Heart sounds: Normal heart sounds.  Pulmonary:     Effort: Pulmonary effort is normal.     Breath sounds: Normal breath sounds.  Abdominal:     General: Bowel sounds are normal.     Palpations: Abdomen is soft.     Tenderness: There is no abdominal tenderness.  Musculoskeletal:        General: No swelling.     Right lower leg: No edema.     Left lower leg: No edema.  Lymphadenopathy:     Cervical: No cervical adenopathy.  Skin:    General: Skin is warm and dry.  Neurological:     Mental Status: He is alert and oriented to person, place, and time. Mental status is at baseline.     Comments: Mild residual deficits from prior CVA  Psychiatric:        Mood and Affect: Mood normal.        Behavior: Behavior normal.      LABORATORY DATA:  I have reviewed the labs as listed.  CBC    Component Value Date/Time   WBC 5.9 03/21/2022 0837   RBC 5.00 03/21/2022 0837   HGB 13.9 03/21/2022 0837   HCT 45.4 03/21/2022 0837   PLT 175 03/21/2022 0837   MCV 90.8 03/21/2022 0837   MCH 27.8 03/21/2022 0837   MCHC 30.6 03/21/2022 0837   RDW 13.2 03/21/2022 0837   LYMPHSABS 1.2 03/21/2022 0837   MONOABS 0.5 03/21/2022 0837   EOSABS 0.2 03/21/2022 0837   BASOSABS 0.1 03/21/2022 0837      Latest Ref Rng & Units 03/21/2022    8:37 AM 12/11/2021   11:09 AM 09/08/2020   10:00 AM  CMP  Glucose 70 - 99  mg/dL 138  216  109   BUN 8 - 23 mg/dL '17  21  15   '$ Creatinine 0.61 - 1.24 mg/dL 1.38  1.38  1.35   Sodium 135 - 145 mmol/L 142  138  137   Potassium 3.5 - 5.1 mmol/L 4.3  4.5  4.5   Chloride 98 - 111 mmol/L 111  109  105   CO2 22 - 32 mmol/L '27  23  26   '$ Calcium 8.9 - 10.3 mg/dL 8.7  8.7  8.8   Total Protein 6.5 - 8.1 g/dL 7.3   7.7   Total Bilirubin 0.3 - 1.2 mg/dL 0.6   0.7   Alkaline Phos 38 - 126 U/L 62   58   AST 15 - 41 U/L 28   22   ALT 0 - 44 U/L 35   22     DIAGNOSTIC IMAGING:  I have independently reviewed the relevant imaging and discussed with the patient.  ASSESSMENT & PLAN: 1.  Normocytic anemia - Normocytic anemia secondary to iron deficiency and anemia of CKD - Colonoscopy on 01/11/2020 with diverticulosis in the sigmoid colon and the descending colon.  5 mm polyp in the ascending colon resected.  Nonbleeding internal hemorrhoids. - SPEP & UPEP checked by nephrologist was negative for monoclonal immunoglobulin or free light chain - No bright red blood per rectum or melena  - Currently taking ferrous sulfate 325 mg daily  - Most recent labs (03/21/2022): Hgb 13.9/MCV 90.8, ferritin 132, iron saturation 21% - PLAN: No indication for IV iron at this time.  Continue oral iron supplementation.  RTC in 12 months for repeat labs and phone visit.   2.  CKD stage IIIa/b - Patient is established with nephrologist, baseline creatinine appears to be 1.3-1.8 -- Creatinine  at baseline - PLAN: Continue follow-up with nephrology (Dr. Theador Hawthorne)   3.  History of superficial thrombophlebitis of right lower extremity - Patient had 3-4 episodes of superficial phlebitis without extension to the deep veins, starting in 2009 - Patient was started on Coumadin due to his CVA, and has not had any episodes of recurrent phlebitis since starting Coumadin    PLAN SUMMARY & DISPOSITION: Labs and RTC in 1 year  All questions were answered. The patient knows to call the clinic with any problems,  questions or concerns.  Medical decision making: Low   Time spent on visit: I spent 15 minutes counseling the patient face to face. The total time spent in the appointment was 25 minutes and more than 50% was on counseling.   Harriett Rush, PA-C  03/28/2022 9:06 AM

## 2022-03-28 ENCOUNTER — Ambulatory Visit (INDEPENDENT_AMBULATORY_CARE_PROVIDER_SITE_OTHER): Payer: Medicare Other | Admitting: *Deleted

## 2022-03-28 ENCOUNTER — Inpatient Hospital Stay (HOSPITAL_BASED_OUTPATIENT_CLINIC_OR_DEPARTMENT_OTHER): Payer: Medicare Other | Admitting: Physician Assistant

## 2022-03-28 VITALS — BP 128/79 | HR 55 | Temp 97.7°F | Resp 18 | Ht 72.0 in | Wt 194.2 lb

## 2022-03-28 DIAGNOSIS — D509 Iron deficiency anemia, unspecified: Secondary | ICD-10-CM

## 2022-03-28 DIAGNOSIS — Z5181 Encounter for therapeutic drug level monitoring: Secondary | ICD-10-CM | POA: Diagnosis not present

## 2022-03-28 DIAGNOSIS — I639 Cerebral infarction, unspecified: Secondary | ICD-10-CM | POA: Diagnosis not present

## 2022-03-28 DIAGNOSIS — D631 Anemia in chronic kidney disease: Secondary | ICD-10-CM

## 2022-03-28 DIAGNOSIS — E1122 Type 2 diabetes mellitus with diabetic chronic kidney disease: Secondary | ICD-10-CM | POA: Diagnosis not present

## 2022-03-28 DIAGNOSIS — Z86718 Personal history of other venous thrombosis and embolism: Secondary | ICD-10-CM | POA: Diagnosis not present

## 2022-03-28 DIAGNOSIS — Z7901 Long term (current) use of anticoagulants: Secondary | ICD-10-CM | POA: Diagnosis not present

## 2022-03-28 DIAGNOSIS — N189 Chronic kidney disease, unspecified: Secondary | ICD-10-CM

## 2022-03-28 DIAGNOSIS — N183 Chronic kidney disease, stage 3 unspecified: Secondary | ICD-10-CM | POA: Diagnosis not present

## 2022-03-28 DIAGNOSIS — Z8673 Personal history of transient ischemic attack (TIA), and cerebral infarction without residual deficits: Secondary | ICD-10-CM | POA: Diagnosis not present

## 2022-03-28 DIAGNOSIS — Z7982 Long term (current) use of aspirin: Secondary | ICD-10-CM | POA: Diagnosis not present

## 2022-03-28 DIAGNOSIS — E782 Mixed hyperlipidemia: Secondary | ICD-10-CM | POA: Diagnosis not present

## 2022-03-28 DIAGNOSIS — I6350 Cerebral infarction due to unspecified occlusion or stenosis of unspecified cerebral artery: Secondary | ICD-10-CM | POA: Diagnosis not present

## 2022-03-28 LAB — POCT INR: INR: 2.9 (ref 2.0–3.0)

## 2022-03-28 NOTE — Patient Instructions (Signed)
Continue warfarin 1 tablet daily except 1 1/2 tablets on Mondays, Wednesdays and Fridays Recheck in 6 weeks   

## 2022-03-28 NOTE — Patient Instructions (Signed)
[place] [facility] Discharge Instructions  You were seen today by Tarri Abernethy PA-C for your iron deficiency anemia.  Your blood and iron counts looked great at today's visit!  Continue to take the iron pill once a day.  We will check your labs and see you for follow-up visit in 1 year.  ** Thank you for trusting me with your healthcare!  I strive to provide all of my patients with quality care at each visit.  If you receive a survey for this visit, I would be so grateful to you for taking the time to provide feedback.  Thank you in advance!  ~ Uva Runkel                   Dr. Derek Jack   &   Tarri Abernethy, PA-C   - - - - - - - - - - - - - - - - - -     Thank you for choosing [place] at [facility] to provide your oncology and hematology care.  To afford each patient quality time with our provider, please arrive at least 15 minutes before your scheduled appointment time.   If you have a lab appointment with the [facility] please come in thru the Main Entrance and check in at the main information desk.  You need to re-schedule your appointment should you arrive 10 or more minutes late.  We strive to give you quality time with our providers, and arriving late affects you and other patients whose appointments are after yours.  Also, if you no show three or more times for appointments you may be dismissed from the clinic at the providers discretion.     Again, thank you for choosing [facility].  Our hope is that these requests will decrease the amount of time that you wait before being seen by our physicians.       _____________________________________________________________  Should you have questions after your visit to [facility], please contact our office at [phone] and follow the prompts.  Our office hours are 8:00 a.m. and 4:30 p.m. Monday - Friday.  Please note that voicemails left  after 4:00 p.m. may not be returned until the following business day.  We are closed weekends and major holidays.  You do have access to a nurse 24-7, just call the main number to the clinic [phone] and do not press any options, hold on the line and a nurse will answer the phone.    For prescription refill requests, have your pharmacy contact our office and allow 72 hours.    Due to Covid, you will need to wear a mask upon entering the hospital. If you do not have a mask, a mask will be given to you at the Main Entrance upon arrival. For doctor visits, patients may have 1 support person age 51 or older with them. For treatment visits, patients can not have anyone with them due to social distancing guidelines and our immunocompromised population.

## 2022-03-31 ENCOUNTER — Other Ambulatory Visit: Payer: Self-pay | Admitting: Cardiology

## 2022-04-01 NOTE — Telephone Encounter (Signed)
Refill request for warfarin:  Last INR was 2.9 on 03/28/22 Next INR is due on 05/09/22 LOV was 12/11/21  Gerrianne Scale PA-C  Refill approved.

## 2022-04-03 DIAGNOSIS — I5022 Chronic systolic (congestive) heart failure: Secondary | ICD-10-CM | POA: Diagnosis not present

## 2022-04-03 DIAGNOSIS — I130 Hypertensive heart and chronic kidney disease with heart failure and stage 1 through stage 4 chronic kidney disease, or unspecified chronic kidney disease: Secondary | ICD-10-CM | POA: Diagnosis not present

## 2022-04-03 DIAGNOSIS — N1831 Chronic kidney disease, stage 3a: Secondary | ICD-10-CM | POA: Diagnosis not present

## 2022-04-03 DIAGNOSIS — E1122 Type 2 diabetes mellitus with diabetic chronic kidney disease: Secondary | ICD-10-CM | POA: Diagnosis not present

## 2022-04-04 ENCOUNTER — Encounter: Payer: Self-pay | Admitting: Internal Medicine

## 2022-04-04 DIAGNOSIS — Z8673 Personal history of transient ischemic attack (TIA), and cerebral infarction without residual deficits: Secondary | ICD-10-CM | POA: Diagnosis not present

## 2022-04-04 DIAGNOSIS — E782 Mixed hyperlipidemia: Secondary | ICD-10-CM | POA: Diagnosis not present

## 2022-04-04 DIAGNOSIS — E663 Overweight: Secondary | ICD-10-CM | POA: Diagnosis not present

## 2022-04-04 DIAGNOSIS — N1831 Chronic kidney disease, stage 3a: Secondary | ICD-10-CM | POA: Diagnosis not present

## 2022-04-04 DIAGNOSIS — G4733 Obstructive sleep apnea (adult) (pediatric): Secondary | ICD-10-CM | POA: Diagnosis not present

## 2022-04-04 DIAGNOSIS — I482 Chronic atrial fibrillation, unspecified: Secondary | ICD-10-CM | POA: Diagnosis not present

## 2022-04-04 DIAGNOSIS — R252 Cramp and spasm: Secondary | ICD-10-CM | POA: Diagnosis not present

## 2022-04-04 DIAGNOSIS — G9009 Other idiopathic peripheral autonomic neuropathy: Secondary | ICD-10-CM | POA: Diagnosis not present

## 2022-04-04 DIAGNOSIS — D509 Iron deficiency anemia, unspecified: Secondary | ICD-10-CM | POA: Diagnosis not present

## 2022-04-04 DIAGNOSIS — I5022 Chronic systolic (congestive) heart failure: Secondary | ICD-10-CM | POA: Diagnosis not present

## 2022-04-04 DIAGNOSIS — I129 Hypertensive chronic kidney disease with stage 1 through stage 4 chronic kidney disease, or unspecified chronic kidney disease: Secondary | ICD-10-CM | POA: Diagnosis not present

## 2022-04-04 DIAGNOSIS — E1122 Type 2 diabetes mellitus with diabetic chronic kidney disease: Secondary | ICD-10-CM | POA: Diagnosis not present

## 2022-04-30 DIAGNOSIS — B351 Tinea unguium: Secondary | ICD-10-CM | POA: Diagnosis not present

## 2022-04-30 DIAGNOSIS — E1142 Type 2 diabetes mellitus with diabetic polyneuropathy: Secondary | ICD-10-CM | POA: Diagnosis not present

## 2022-05-09 ENCOUNTER — Ambulatory Visit: Payer: Medicare Other | Attending: Cardiology | Admitting: *Deleted

## 2022-05-09 DIAGNOSIS — I6350 Cerebral infarction due to unspecified occlusion or stenosis of unspecified cerebral artery: Secondary | ICD-10-CM

## 2022-05-09 DIAGNOSIS — Z7901 Long term (current) use of anticoagulants: Secondary | ICD-10-CM | POA: Diagnosis not present

## 2022-05-09 DIAGNOSIS — Z86718 Personal history of other venous thrombosis and embolism: Secondary | ICD-10-CM | POA: Diagnosis not present

## 2022-05-09 DIAGNOSIS — I513 Intracardiac thrombosis, not elsewhere classified: Secondary | ICD-10-CM | POA: Diagnosis not present

## 2022-05-09 DIAGNOSIS — Z5181 Encounter for therapeutic drug level monitoring: Secondary | ICD-10-CM | POA: Diagnosis not present

## 2022-05-09 DIAGNOSIS — I639 Cerebral infarction, unspecified: Secondary | ICD-10-CM | POA: Diagnosis not present

## 2022-05-09 LAB — POCT INR: INR: 2.5 (ref 2.0–3.0)

## 2022-05-09 NOTE — Patient Instructions (Signed)
Continue warfarin 1 tablet daily except 1 1/2 tablets on Mondays, Wednesdays and Fridays Recheck in 6 weeks   

## 2022-05-24 ENCOUNTER — Other Ambulatory Visit: Payer: Self-pay | Admitting: Cardiology

## 2022-06-13 NOTE — Progress Notes (Signed)
Cardiology Office Note  Date: 06/14/2022   ID: Dennis Yu, Dennis Yu Aug 29, 1945, MRN [id number]  PCP:  [name], MD  Cardiologist:  Rozann Lesches, MD Electrophysiologist:  None   Chief Complaint  Patient presents with   Cardiac follow-up    History of Present Illness: Dennis Yu is a 76 y.o. male last seen in May by Ms. Vita Barley, I reviewed the note.  He is here for a routine visit.  Reports NYHA class II dyspnea, no angina, no palpitations or syncope.  We went over his medications, he has been taking Lasix 40 mg daily at this point.  We discussed daily weights and self adjusting with extra Lasix depending on weight/fluid gain.  He remains compliant with the remainder of his medications.  We did discuss updating his echocardiogram to reassess LVEF.  As noted previously, he has preferred a conservative approach without further invasive evaluation or ICD.  He is on Coumadin with follow-up in the anticoagulation clinic.  No reported spontaneous bleeding problems.  Past Medical History:  Diagnosis Date   Bronchitis 11/2012   Cardiomyopathy    LVEF 25%   Erectile dysfunction    Essential hypertension    GERD (gastroesophageal reflux disease)    History of colonic polyps    Mixed hyperlipidemia    Mural thrombus of left ventricle    Obstructive sleep apnea    Patent foramen ovale    Perforated duodenal ulcer ([place]) 01/2020   recommended repeat EGD 6-8 week, but patient declined.   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  Recommended 5 year repeat if health permits.   POLYPECTOMY  01/11/2020   Procedure: POLYPECTOMY;  Surgeon: Daneil Dolin, MD;  [facility];  Service: Endoscopy;;    Current Outpatient Medications  Medication Sig Dispense Refill   aspirin 81 MG tablet Take 81 mg by mouth daily.     atorvastatin (LIPITOR) 40 MG tablet Take 40 mg by mouth daily.     carvedilol (COREG) 6.25 MG tablet Take 1 tablet (6.25 mg total) by mouth 2 (two) times daily with a meal. 180 tablet 3   FARXIGA 10 MG TABS tablet Take 10 mg by mouth daily.     Ferrous Sulfate (IRON SUPPLEMENT PO) Take by mouth daily.     furosemide (LASIX) 40 MG tablet TAKE 1 AND 1/2 TABLETS     DAILY (Patient taking differently: Take 40 mg by mouth daily.) 135 tablet 1   gabapentin (NEURONTIN) 300 MG capsule Take 3 capsules (900 mg total) by mouth daily. (Patient taking differently: Take 300-600 mg by mouth See admin instructions. 300 mg during the day, 600 mg at bedtime) 90 capsule 11   glipiZIDE (GLUCOTROL) 5 MG tablet TAKE 1 TABLET AT DINNER FORSUGARS (STOP METFORMIN).     NON FORMULARY legatrin pm Daily     Omega-3 Fatty Acids (FISH OIL) 1000 MG CAPS Take 1,000 mg by mouth daily.     pantoprazole (PROTONIX) 40  MG tablet Take 1 tablet (40 mg total) by mouth daily. 90 tablet 2   PROAIR HFA 108 (90 BASE) MCG/ACT inhaler Inhale 2 puffs into the lungs every 4 (four) hours as needed for wheezing or shortness of breath.      sacubitril-valsartan (ENTRESTO) 24-26 MG Take 1 tablet by mouth 2 (two) times daily. 180 tablet 1   warfarin (JANTOVEN) 5 MG tablet TAKE 1 TABLET DAILY EXCEPT  1 AND 1/2 TABLETS         MONDAY, WEDNESDAY,    AND  FRIDAY OR AS DIRECTED 109 tablet 3   No current facility-administered medications for this visit.   Allergies:  Patient has no known allergies.   ROS: No palpitations or syncope.  Physical Exam: VS:  BP 138/80   Pulse (!) 59   Ht 6' (1.829 m)   Wt 197 lb 12.8  oz (89.7 kg)   SpO2 96%   BMI 26.83 kg/m , BMI Body mass index is 26.83 kg/m.  Wt Readings from Last 3 Encounters:  06/14/22 197 lb 12.8 oz (89.7 kg)  03/28/22 194 lb 3.6 oz (88.1 kg)  12/11/21 193 lb (87.5 kg)    General: Patient appears comfortable at rest. HEENT: Conjunctiva and lids normal. Neck: Supple, no elevated JVP or carotid bruits. Lungs: Clear to auscultation, nonlabored breathing at rest. Cardiac: Regular rate and rhythm, no S3, 1/6 systolic murmur. Extremities: No pitting edema.  ECG:  An ECG dated 09/12/2021 was personally reviewed today and demonstrated:  Sinus rhythm with old inferior infarct pattern, IVCD, diffuse nonspecific ST-T wave changes.  Recent Labwork: 03/21/2022: ALT 35; AST 28; BUN 17; Creatinine, Ser 1.38; Hemoglobin 13.9; Platelets 175; Potassium 4.3; Sodium 142  August 2023: Cholesterol 119, triglycerides 115, HDL 28, LDL 70  Other Studies Reviewed Today:  Echocardiogram 02/23/2020:  1. Left ventricular ejection fraction, by estimation, is approximately  25%. The left ventricle has severely decreased function. The left  ventricle demonstrates regional wall motion abnormalities (see scoring  diagram/findings for description). The left  ventricular internal cavity size was mildly dilated. Left ventricular  diastolic parameters are indeterminate.   2. Calcified apical LV mural thrombus present. Also prominent apical  trabeculation.   3. Right ventricular systolic function is normal. The right ventricular  size is normal. Tricuspid regurgitation signal is inadequate for assessing  PA pressure.   4. Left atrial size was mildly dilated.   5. The mitral valve is abnormal, mild to moderate annular calcification  and restricted posterior leaflet motion. Moderate mitral valve  regurgitation.   6. The aortic valve is tricuspid, moderate annular calcification. Aortic  valve regurgitation is not visualized.   7. The inferior vena cava is normal in size with  greater than 50%  respiratory variability, suggesting right atrial pressure of 3 mmHg.   Assessment and Plan:  1.  HFrEF with ischemic cardiomyopathy, LVEF approximately 25% as of 2021.  He is clinically stable with NYHA class II dyspnea.  Continue Coreg, [place], Farxiga, and Lasix.  Obtain follow-up echocardiogram for reevaluation.  He does prefer a conservative approach however, no invasive testing or ICD.  2.  History of stroke as well as calcified apical LV mural thrombus.  We have continued Coumadin long-term which he is tolerating.  Keep follow-up in the anticoagulation clinic.  3.  CKD stage IIIb, last creatinine 1.38 with normal potassium.  Medication Adjustments/Labs and Tests Ordered: Current medicines are reviewed at length with the patient today.  Concerns regarding medicines are outlined above.  Tests Ordered: Orders Placed This Encounter  Procedures   ECHOCARDIOGRAM COMPLETE    Medication Changes: No orders of the defined types were placed in this encounter.   Disposition:  Follow up  6 months.  Signed, Satira Sark, MD, [facility] 06/14/2022 10:04 AM    [place] [facility]. [address] [place], [place] [address] Phone: [phone]; Fax: [phone]

## 2022-06-14 ENCOUNTER — Encounter: Payer: Self-pay | Admitting: Cardiology

## 2022-06-14 ENCOUNTER — Ambulatory Visit: Payer: Medicare Other | Attending: Cardiology | Admitting: Cardiology

## 2022-06-14 VITALS — BP 138/80 | HR 59 | Ht 72.0 in | Wt 197.8 lb

## 2022-06-14 DIAGNOSIS — I513 Intracardiac thrombosis, not elsewhere classified: Secondary | ICD-10-CM | POA: Diagnosis not present

## 2022-06-14 DIAGNOSIS — I5020 Unspecified systolic (congestive) heart failure: Secondary | ICD-10-CM | POA: Diagnosis not present

## 2022-06-14 DIAGNOSIS — N1832 Chronic kidney disease, stage 3b: Secondary | ICD-10-CM | POA: Insufficient documentation

## 2022-06-14 DIAGNOSIS — I255 Ischemic cardiomyopathy: Secondary | ICD-10-CM | POA: Diagnosis not present

## 2022-06-14 DIAGNOSIS — I6350 Cerebral infarction due to unspecified occlusion or stenosis of unspecified cerebral artery: Secondary | ICD-10-CM

## 2022-06-14 NOTE — Patient Instructions (Signed)
Medication Instructions:  Your physician recommends that you continue on your current medications as directed. Please refer to the Current Medication list given to you today.   Labwork: None today   Testing/Procedures: Your physician has requested that you have an echocardiogram. Echocardiography is a painless test that uses sound waves to create images of your heart. It provides your doctor with information about the size and shape of your heart and how well your heart's chambers and valves are working. This procedure takes approximately one hour. There are no restrictions for this procedure. Please do NOT wear cologne, perfume, aftershave, or lotions (deodorant is allowed). Please arrive 15 minutes prior to your appointment time.   Follow-Up: 6 months  Any Other Special Instructions Will Be Listed Below (If Applicable).  If you need a refill on your cardiac medications before your next appointment, please call your pharmacy.  

## 2022-06-20 ENCOUNTER — Ambulatory Visit: Payer: Medicare Other | Attending: Cardiology | Admitting: *Deleted

## 2022-06-20 DIAGNOSIS — Z5181 Encounter for therapeutic drug level monitoring: Secondary | ICD-10-CM | POA: Diagnosis not present

## 2022-06-20 DIAGNOSIS — I513 Intracardiac thrombosis, not elsewhere classified: Secondary | ICD-10-CM

## 2022-06-20 DIAGNOSIS — I6350 Cerebral infarction due to unspecified occlusion or stenosis of unspecified cerebral artery: Secondary | ICD-10-CM

## 2022-06-20 LAB — POCT INR: INR: 2.2 (ref 2.0–3.0)

## 2022-06-20 NOTE — Patient Instructions (Signed)
Continue warfarin 1 tablet daily except 1 1/2 tablets on Mondays, Wednesdays and Fridays Recheck in 6 weeks   

## 2022-06-25 ENCOUNTER — Ambulatory Visit (HOSPITAL_COMMUNITY)
Admission: RE | Admit: 2022-06-25 | Discharge: 2022-06-25 | Disposition: A | Discharge status: Home / Self Care | Payer: Medicare Other | Source: Ambulatory Visit | Attending: Cardiology | Admitting: Cardiology

## 2022-06-25 ENCOUNTER — Telehealth: Payer: Self-pay | Admitting: *Deleted

## 2022-06-25 DIAGNOSIS — I255 Ischemic cardiomyopathy: Secondary | ICD-10-CM | POA: Insufficient documentation

## 2022-06-25 LAB — ECHOCARDIOGRAM COMPLETE
Area-P 1/2: 3.91 cm2
Calc EF: 41.5 %
MV M vel: 4.59 m/s
MV Peak grad: 84.3 mmHg
Radius: 0.7 cm
S' Lateral: 5.4 cm
Single Plane A2C EF: 34.5 %
Single Plane A4C EF: 41.4 %

## 2022-06-25 NOTE — Progress Notes (Signed)
*  PRELIMINARY RESULTS* Echocardiogram 2D Echocardiogram has been performed.  Samuel Germany 06/25/2022, 9:29 AM

## 2022-06-25 NOTE — Telephone Encounter (Signed)
Pt assistance forms faxed to [facility]

## 2022-07-03 ENCOUNTER — Telehealth: Payer: Self-pay | Admitting: *Deleted

## 2022-07-03 NOTE — Telephone Encounter (Signed)
Called to notify pt that he was approved for the [employer] (entresto) patient assistance program. Pt is approved through 08/05/23

## 2022-07-16 DIAGNOSIS — B351 Tinea unguium: Secondary | ICD-10-CM | POA: Diagnosis not present

## 2022-07-16 DIAGNOSIS — E1142 Type 2 diabetes mellitus with diabetic polyneuropathy: Secondary | ICD-10-CM | POA: Diagnosis not present

## 2022-07-31 DIAGNOSIS — E1122 Type 2 diabetes mellitus with diabetic chronic kidney disease: Secondary | ICD-10-CM | POA: Diagnosis not present

## 2022-07-31 DIAGNOSIS — E782 Mixed hyperlipidemia: Secondary | ICD-10-CM | POA: Diagnosis not present

## 2022-08-01 ENCOUNTER — Ambulatory Visit: Payer: Medicare Other | Attending: Cardiology | Admitting: *Deleted

## 2022-08-01 DIAGNOSIS — Z5181 Encounter for therapeutic drug level monitoring: Secondary | ICD-10-CM | POA: Diagnosis not present

## 2022-08-01 DIAGNOSIS — I513 Intracardiac thrombosis, not elsewhere classified: Secondary | ICD-10-CM

## 2022-08-01 DIAGNOSIS — I6350 Cerebral infarction due to unspecified occlusion or stenosis of unspecified cerebral artery: Secondary | ICD-10-CM

## 2022-08-01 LAB — POCT INR: INR: 2.5 (ref 2.0–3.0)

## 2022-08-01 NOTE — Patient Instructions (Signed)
Continue warfarin 1 tablet daily except 1 1/2 tablets on Mondays, Wednesdays and Fridays Recheck in 6 weeks   

## 2022-08-06 DIAGNOSIS — I130 Hypertensive heart and chronic kidney disease with heart failure and stage 1 through stage 4 chronic kidney disease, or unspecified chronic kidney disease: Secondary | ICD-10-CM | POA: Diagnosis not present

## 2022-08-06 DIAGNOSIS — G4733 Obstructive sleep apnea (adult) (pediatric): Secondary | ICD-10-CM | POA: Diagnosis not present

## 2022-08-06 DIAGNOSIS — G9009 Other idiopathic peripheral autonomic neuropathy: Secondary | ICD-10-CM | POA: Diagnosis not present

## 2022-08-06 DIAGNOSIS — E1122 Type 2 diabetes mellitus with diabetic chronic kidney disease: Secondary | ICD-10-CM | POA: Diagnosis not present

## 2022-08-06 DIAGNOSIS — N1831 Chronic kidney disease, stage 3a: Secondary | ICD-10-CM | POA: Diagnosis not present

## 2022-08-06 DIAGNOSIS — I129 Hypertensive chronic kidney disease with stage 1 through stage 4 chronic kidney disease, or unspecified chronic kidney disease: Secondary | ICD-10-CM | POA: Diagnosis not present

## 2022-08-06 DIAGNOSIS — I482 Chronic atrial fibrillation, unspecified: Secondary | ICD-10-CM | POA: Diagnosis not present

## 2022-08-06 DIAGNOSIS — E782 Mixed hyperlipidemia: Secondary | ICD-10-CM | POA: Diagnosis not present

## 2022-08-06 DIAGNOSIS — Z23 Encounter for immunization: Secondary | ICD-10-CM | POA: Diagnosis not present

## 2022-08-06 DIAGNOSIS — E663 Overweight: Secondary | ICD-10-CM | POA: Diagnosis not present

## 2022-08-06 DIAGNOSIS — D509 Iron deficiency anemia, unspecified: Secondary | ICD-10-CM | POA: Diagnosis not present

## 2022-08-06 DIAGNOSIS — I5022 Chronic systolic (congestive) heart failure: Secondary | ICD-10-CM | POA: Diagnosis not present

## 2022-08-24 IMAGING — CT CT ABD-PELV W/ CM
2 of 5 series · 14 of 46 positions shown, 16 images · IV contrast (omnipaque)
Comparison: Abdominal CT 01/13/2020

CLINICAL DATA: Acute nonlocalized abdominal pain. History of
duodenal ulcer perforation, concern for abscess. Patient reports
generalized weakness, fatigue and diminished appetite since
colonoscopy in [REDACTED]. No bowel movement for 4 days.

EXAM:
CT ABDOMEN AND PELVIS WITH CONTRAST
TECHNIQUE: Multidetector CT imaging of the abdomen and pelvis was performed
using the standard protocol following bolus administration of
intravenous contrast.
CONTRAST:  100mL OMNIPAQUE IOHEXOL 300 MG/ML  SOLN

[Series 3: abdomen 5.0 · axial · 0.89mm/px · z∈[+814,+1239]mm · 11 of 97 slices shown, 13 images]
[im 6/97  soft-tissue]
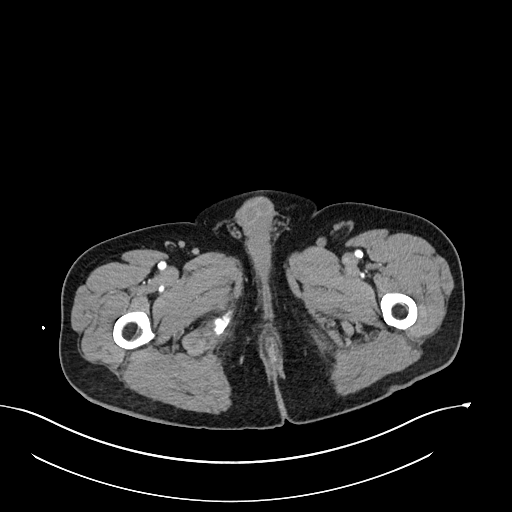
[im 6/97  bone]
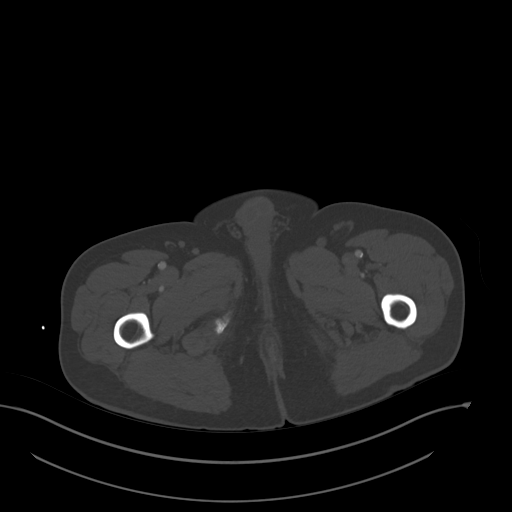
[im 17/97  soft-tissue]
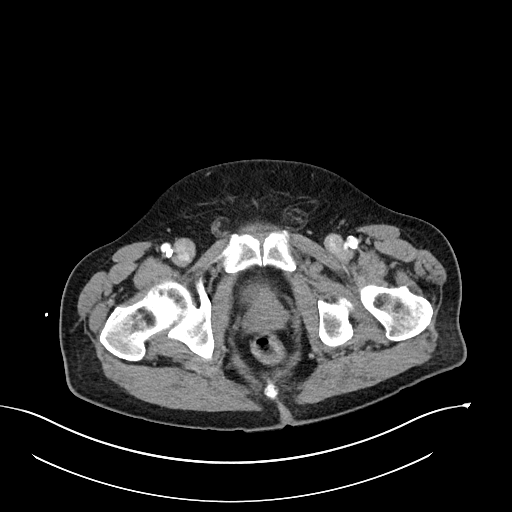
[im 23/97  soft-tissue]
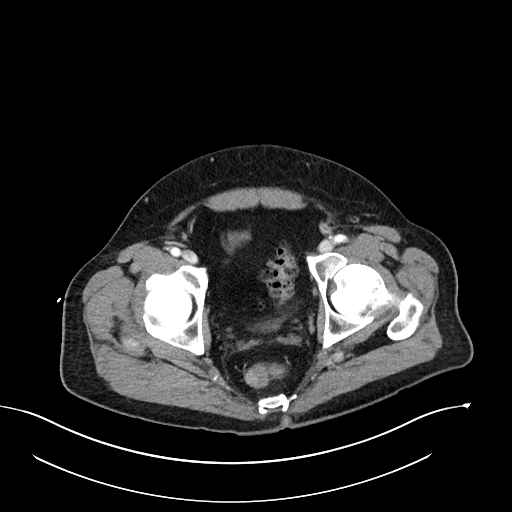
[im 34/97  soft-tissue]
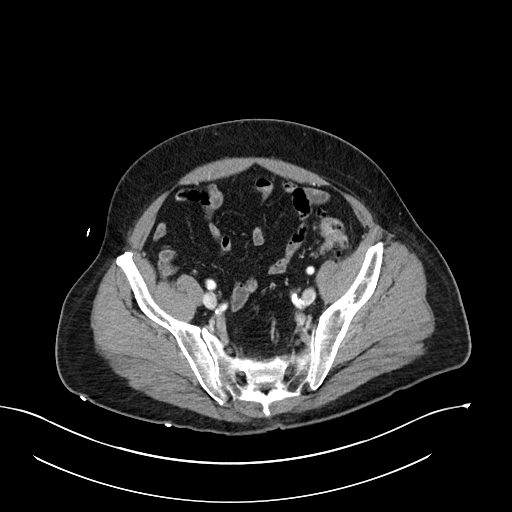
[im 40/97  soft-tissue]
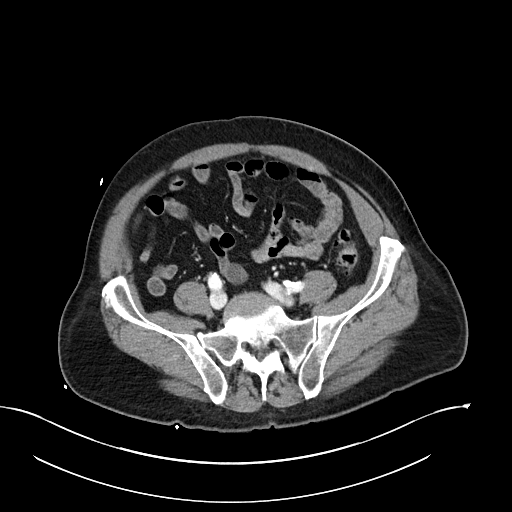
[im 51/97  soft-tissue]
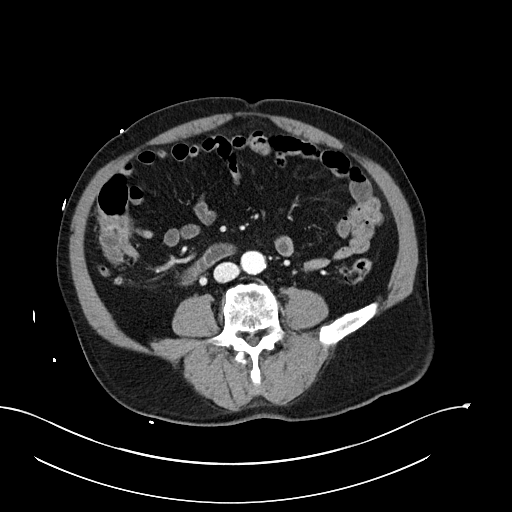
[im 57/97  soft-tissue]
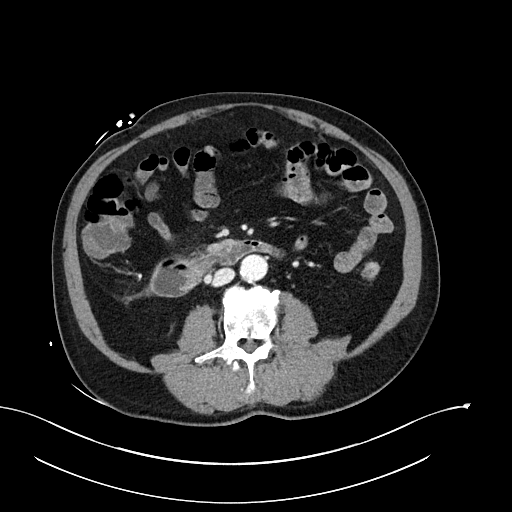
[im 63/97  soft-tissue]
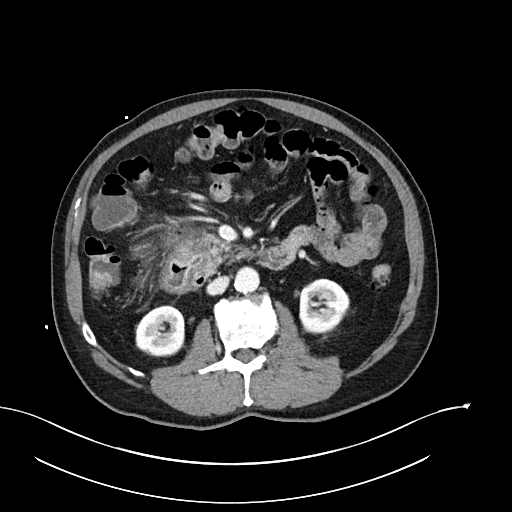
[im 74/97  soft-tissue]
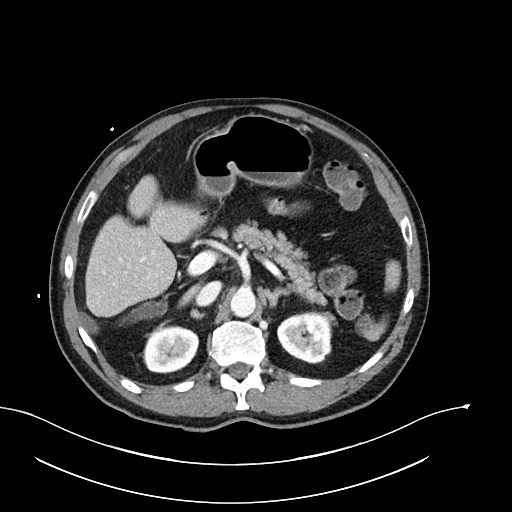
[im 74/97  bone]
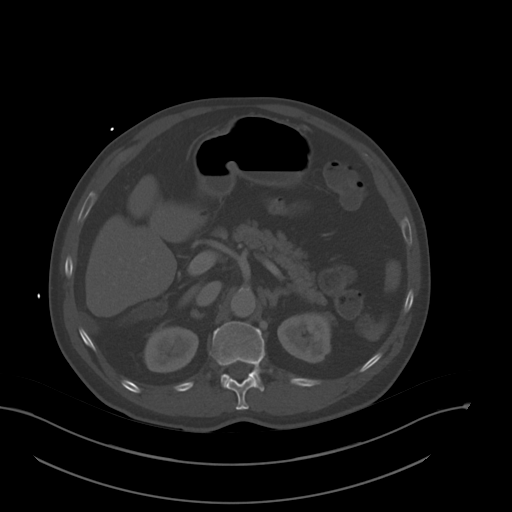
[im 80/97  soft-tissue]
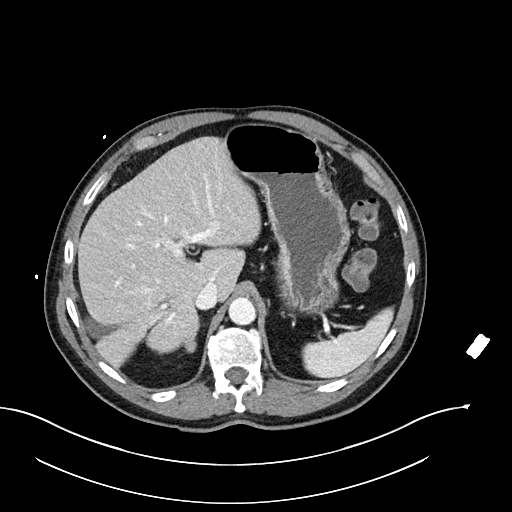
[im 91/97  soft-tissue]
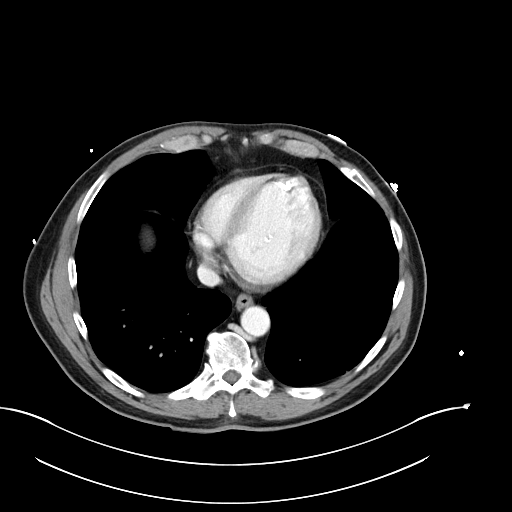

[Series 6: abdomen 3.0 mpr cor · coronal · 0.79mm/px · 3 of 111 slices shown]
[im 37/111  soft-tissue]
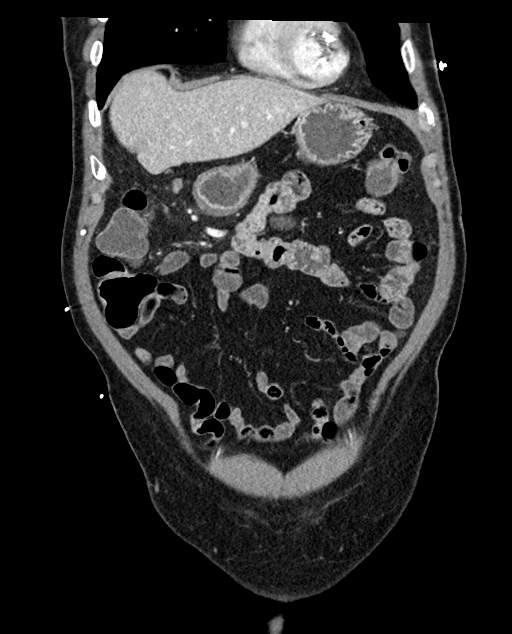
[im 49/111  soft-tissue]
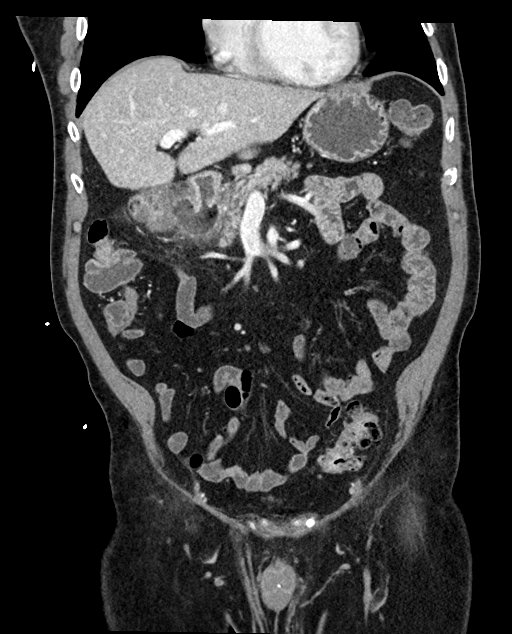
[im 62/111  soft-tissue]
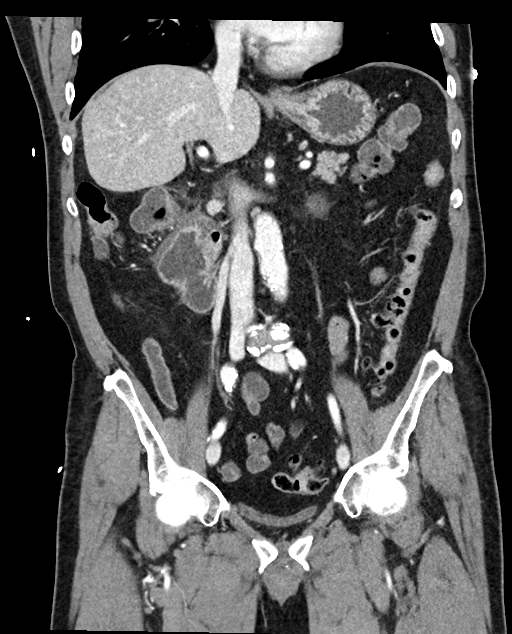

[14 of 46 positions shown; findings below may reference images not displayed]

FINDINGS: Lower chest: Basilar emphysema. No consolidation or pleural fluid.
Subsegmental atelectasis in the left lower lobe. Decompressed distal
esophagus.

Hepatobiliary: No focal hepatic abnormality. Unusual configuration
of the liver with gallbladder directed posteriorly, unchanged from
prior exam. There is no biliary dilatation or calcified gallstone.

Pancreas: No ductal dilatation or inflammation.

Spleen: Normal in size without focal abnormality.

Adrenals/Urinary Tract: No adrenal nodule. No hydronephrosis or
perinephric edema. Homogeneous renal enhancement with symmetric
excretion on delayed phase imaging. Urinary bladder is near
completely empty.

Stomach/Bowel: Again noted to be fat stranding and inflammation
adjacent to the first, second and third portions of the duodenum
with wall thickening. There is mild mucosal enhancement. Fluid
within the duodenal bulb and third portion of the duodenum without
evidence of perforation. There is a medially projecting duodenal
diverticulum arising from the descending duodenum, series 3, image
35. Large diverticulum involving the duodenal jejunal junction
without inflammation, series 3, image 31. Inflammatory changes
extend into the right upper quadrant where there is wall thickening
of the adjacent to the hepatic flexure of the colon. Scattered fluid
within nondilated or inflamed small bowel. Occasional small bowel
diverticulosis without diverticulitis. Normal appendix. Fluid/liquid
stool in the colon. Wall thickening at the hepatic flexure adjacent
to duodenal inflammation. Mixed liquid and solid stool in the more
distal colon. Colonic diverticula from the descending through the
sigmoid. No acute colonic diverticulitis.

Vascular/Lymphatic: Prominent Peri duodenal nodes including an
anterior 8 mm node series 3, image 32. There is a acute duodenal
node measuring 10 mm, series 3, image 29. there is a pancreatic
portal node measuring 10 mm, series 3, image 25. The portal vein is
patent. Aortic atherosclerosis. No aortic aneurysm.

Reproductive: Prostate is unremarkable.

Other: Inflammatory changes in the right upper quadrant with fat
stranding and trace non organized free fluid which tracks in the
anterior right pararenal space. No free air or abscess. Small fat
containing umbilical hernia.

Musculoskeletal: Multilevel degenerative disc disease and facet
hypertrophy in the lumbar spine. There are no acute or suspicious
osseous abnormalities.
IMPRESSION: 1. Inflammatory changes adjacent to the duodenum in the right upper
quadrant with fat stranding and trace non organized free fluid which
tracks in the anterior right pararenal space. No evidence of
perforation or abscess. Again seen duodenal wall thickening with
small duodenal diverticula. There is no evidence of perforation or
abscess. Differential considerations include peptic ulcer disease or
duodenal diverticulitis
2. Colonic diverticulosis from the descending through the sigmoid
colon. There is also a distal small bowel diverticulosis. No
evidence of small bowel or colonic diverticulitis.
3. Prominent peri duodenal nodes are likely reactive, but
nonspecific.

Aortic Atherosclerosis (NGLZD-16B.B) and Emphysema (NGLZD-656.6).

## 2022-09-09 DIAGNOSIS — E1122 Type 2 diabetes mellitus with diabetic chronic kidney disease: Secondary | ICD-10-CM | POA: Diagnosis not present

## 2022-09-09 DIAGNOSIS — I5042 Chronic combined systolic (congestive) and diastolic (congestive) heart failure: Secondary | ICD-10-CM | POA: Diagnosis not present

## 2022-09-09 DIAGNOSIS — N189 Chronic kidney disease, unspecified: Secondary | ICD-10-CM | POA: Diagnosis not present

## 2022-09-09 DIAGNOSIS — I129 Hypertensive chronic kidney disease with stage 1 through stage 4 chronic kidney disease, or unspecified chronic kidney disease: Secondary | ICD-10-CM | POA: Diagnosis not present

## 2022-09-09 DIAGNOSIS — E875 Hyperkalemia: Secondary | ICD-10-CM | POA: Diagnosis not present

## 2022-09-12 ENCOUNTER — Ambulatory Visit: Payer: Medicare Other | Attending: Cardiology | Admitting: *Deleted

## 2022-09-12 DIAGNOSIS — I513 Intracardiac thrombosis, not elsewhere classified: Secondary | ICD-10-CM | POA: Diagnosis not present

## 2022-09-12 DIAGNOSIS — Z5181 Encounter for therapeutic drug level monitoring: Secondary | ICD-10-CM | POA: Diagnosis not present

## 2022-09-12 DIAGNOSIS — I6350 Cerebral infarction due to unspecified occlusion or stenosis of unspecified cerebral artery: Secondary | ICD-10-CM | POA: Diagnosis not present

## 2022-09-12 LAB — POCT INR: INR: 2.2 (ref 2.0–3.0)

## 2022-09-12 NOTE — Patient Instructions (Signed)
Continue warfarin 1 tablet daily except 1 1/2 tablets on Mondays, Wednesdays and Fridays Recheck in 6 weeks   

## 2022-10-01 DIAGNOSIS — B351 Tinea unguium: Secondary | ICD-10-CM | POA: Diagnosis not present

## 2022-10-01 DIAGNOSIS — E1142 Type 2 diabetes mellitus with diabetic polyneuropathy: Secondary | ICD-10-CM | POA: Diagnosis not present

## 2022-10-17 ENCOUNTER — Encounter: Payer: Self-pay | Admitting: Gastroenterology

## 2022-10-24 ENCOUNTER — Ambulatory Visit: Payer: Medicare Other | Attending: Cardiology

## 2022-10-24 DIAGNOSIS — I6349 Cerebral infarction due to embolism of other cerebral artery: Secondary | ICD-10-CM | POA: Diagnosis not present

## 2022-10-24 DIAGNOSIS — I513 Intracardiac thrombosis, not elsewhere classified: Secondary | ICD-10-CM

## 2022-10-24 DIAGNOSIS — Z5181 Encounter for therapeutic drug level monitoring: Secondary | ICD-10-CM | POA: Diagnosis not present

## 2022-10-24 DIAGNOSIS — I6350 Cerebral infarction due to unspecified occlusion or stenosis of unspecified cerebral artery: Secondary | ICD-10-CM | POA: Insufficient documentation

## 2022-10-24 LAB — POCT INR: INR: 2.7 (ref 2.0–3.0)

## 2022-10-24 NOTE — Patient Instructions (Signed)
Description   ?Continue warfarin 1 tablet daily except 1 1/2 tablets on Mondays, Wednesdays and Fridays ?Recheck in 6 weeks   ? ?  ? ? ?

## 2022-12-02 DIAGNOSIS — E782 Mixed hyperlipidemia: Secondary | ICD-10-CM | POA: Diagnosis not present

## 2022-12-02 DIAGNOSIS — E1122 Type 2 diabetes mellitus with diabetic chronic kidney disease: Secondary | ICD-10-CM | POA: Diagnosis not present

## 2022-12-05 ENCOUNTER — Ambulatory Visit: Payer: Medicare Other | Attending: Cardiology | Admitting: *Deleted

## 2022-12-05 DIAGNOSIS — I6350 Cerebral infarction due to unspecified occlusion or stenosis of unspecified cerebral artery: Secondary | ICD-10-CM | POA: Insufficient documentation

## 2022-12-05 DIAGNOSIS — I513 Intracardiac thrombosis, not elsewhere classified: Secondary | ICD-10-CM | POA: Diagnosis not present

## 2022-12-05 DIAGNOSIS — Z5181 Encounter for therapeutic drug level monitoring: Secondary | ICD-10-CM | POA: Diagnosis not present

## 2022-12-05 LAB — POCT INR: INR: 2.4 (ref 2.0–3.0)

## 2022-12-05 NOTE — Patient Instructions (Signed)
Continue warfarin 1 tablet daily except 1 1/2 tablets on Mondays, Wednesdays and Fridays Recheck in 6 weeks   

## 2022-12-06 DIAGNOSIS — I482 Chronic atrial fibrillation, unspecified: Secondary | ICD-10-CM | POA: Diagnosis not present

## 2022-12-06 DIAGNOSIS — G4733 Obstructive sleep apnea (adult) (pediatric): Secondary | ICD-10-CM | POA: Diagnosis not present

## 2022-12-06 DIAGNOSIS — E1122 Type 2 diabetes mellitus with diabetic chronic kidney disease: Secondary | ICD-10-CM | POA: Diagnosis not present

## 2022-12-06 DIAGNOSIS — E782 Mixed hyperlipidemia: Secondary | ICD-10-CM | POA: Diagnosis not present

## 2022-12-06 DIAGNOSIS — E663 Overweight: Secondary | ICD-10-CM | POA: Diagnosis not present

## 2022-12-06 DIAGNOSIS — G9009 Other idiopathic peripheral autonomic neuropathy: Secondary | ICD-10-CM | POA: Diagnosis not present

## 2022-12-06 DIAGNOSIS — D509 Iron deficiency anemia, unspecified: Secondary | ICD-10-CM | POA: Diagnosis not present

## 2022-12-06 DIAGNOSIS — I5022 Chronic systolic (congestive) heart failure: Secondary | ICD-10-CM | POA: Diagnosis not present

## 2022-12-06 DIAGNOSIS — N181 Chronic kidney disease, stage 1: Secondary | ICD-10-CM | POA: Diagnosis not present

## 2022-12-06 DIAGNOSIS — I69959 Hemiplegia and hemiparesis following unspecified cerebrovascular disease affecting unspecified side: Secondary | ICD-10-CM | POA: Diagnosis not present

## 2022-12-06 DIAGNOSIS — I129 Hypertensive chronic kidney disease with stage 1 through stage 4 chronic kidney disease, or unspecified chronic kidney disease: Secondary | ICD-10-CM | POA: Diagnosis not present

## 2022-12-06 DIAGNOSIS — I130 Hypertensive heart and chronic kidney disease with heart failure and stage 1 through stage 4 chronic kidney disease, or unspecified chronic kidney disease: Secondary | ICD-10-CM | POA: Diagnosis not present

## 2022-12-08 NOTE — Progress Notes (Signed)
Referring Provider: Benita Stabile, MD Primary Care Physician:  Benita Stabile, MD Primary GI Physician: Dr. Jena Gauss  Chief Complaint  Patient presents with   Follow-up    HPI:   Dennis Yu is a 77 y.o. male presenting today with GI history of  IDA, GERD, suspected perforated duodenal ulcer in June 2021 with recommendations for follow-up endoscopy in 6-8 weeks, but patient declined,  presenting today for 1 yr follow-up.    Colonoscopy up to date in June 2021 with diverticular disease, single sessile serrated polyp without cytologic dysplasia and negative colon biopsies.  Recommended 5 year repeat if health permits.   Most recent labs 12/02/2022 with hemoglobin 14.4, LFTs within normal limits.  Last seen in our office 11/07/2021.  GERD well-controlled on Protonix 40 mg daily.  Was also taking iron daily.  Recent hemoglobin and iron within normal limits.  No other significant GI symptoms.  Recommended continuing current medications, following with hematology, and follow-up in 1 year.  Today:  Doing well overall.  GERD is well-controlled on pantoprazole 40 mg daily.  Denies nausea, vomiting, dysphagia, abdominal pain.  He continues to follow with hematology yearly.  He is taking oral iron.  Denies BRBPR, melena, NSAID use.  States he is not interested in any further endoscopic evaluation including repeat colonoscopy unless he develops new symptoms.  Past Medical History:  Diagnosis Date   Bronchitis 11/2012   Cardiomyopathy    LVEF 25%   Erectile dysfunction    Essential hypertension    GERD (gastroesophageal reflux disease)    History of colonic polyps    Mixed hyperlipidemia    Mural thrombus of left ventricle    Obstructive sleep apnea    Patent foramen ovale    Perforated duodenal ulcer ([facility]) 01/2020   recommended repeat EGD 6-8 week, but patient declined.   Phlebitis    Stroke [facility])    Embolic left temporal 9/12 s/p tPA   Superficial phlebitis 12/10/2012   Type 2  diabetes mellitus ([facility])    Vitamin D deficiency     Past Surgical History:  Procedure Laterality Date   BIOPSY  01/11/2020   Procedure: BIOPSY;  Surgeon: Corbin Ade, MD;  [facility];  Service: Endoscopy;;   COLONOSCOPY WITH PROPOFOL N/A 01/11/2020   Surgeon: Corbin Ade, MD; diverticular disease, single sessile serrated polyp without cytologic dysplasia and negative colon biopsies.  Recommended 5 year repeat if health permits. *Patient requesting no future colonoscopy.   POLYPECTOMY  01/11/2020   Procedure: POLYPECTOMY;  Surgeon: Corbin Ade, MD;  [facility];  Service: Endoscopy;;    Current Outpatient Medications  Medication Sig Dispense Refill   aspirin 81 MG tablet Take 81 mg by mouth daily.     atorvastatin (LIPITOR) 40 MG tablet Take 40 mg by mouth daily.     carvedilol (COREG) 6.25 MG tablet Take 1 tablet (6.25 mg total) by mouth 2 (two) times daily with a meal. 180 tablet 3   FARXIGA 10 MG TABS tablet Take 10 mg by mouth daily.     Ferrous Sulfate (IRON SUPPLEMENT PO) Take by mouth daily.     furosemide (LASIX) 40 MG tablet TAKE 1 AND 1/2 TABLETS     DAILY (Patient taking differently: Take 40 mg by mouth daily.) 135 tablet 1   gabapentin (NEURONTIN) 300 MG capsule Take 3 capsules (900 mg total) by mouth daily. (Patient taking differently: Take 300-600 mg by mouth See admin instructions.  300 mg during the day, 600 mg at bedtime) 90 capsule 11   glipiZIDE (GLUCOTROL) 5 MG tablet TAKE 1 TABLET AT DINNER FORSUGARS (STOP METFORMIN).     NON FORMULARY legatrin pm Daily     Omega-3 Fatty Acids (FISH OIL) 1000 MG CAPS Take 1,000 mg by mouth daily.     pantoprazole (PROTONIX) 40 MG tablet Take 1 tablet (40 mg total) by mouth daily. 90 tablet 2   PROAIR HFA 108 (90 BASE) MCG/ACT inhaler Inhale 2 puffs into the lungs every 4 (four) hours as needed for wheezing or shortness of breath.      sacubitril-valsartan (ENTRESTO) 24-26 MG Take 1 tablet by mouth 2  (two) times daily. 180 tablet 1   warfarin (JANTOVEN) 5 MG tablet TAKE 1 TABLET DAILY EXCEPT  1 AND 1/2 TABLETS         MONDAY, WEDNESDAY,    AND  FRIDAY OR AS DIRECTED 109 tablet 3   No current facility-administered medications for this visit.    Allergies as of 12/11/2022   (No Known Allergies)    Family History  Problem Relation Age of Onset   Diabetes Father    Hypertension Other    Colon cancer Neg Hx     Social History   Socioeconomic History   Marital status: Married    Spouse name: Not on file   Number of children: Not on file   Years of education: Not on file   Highest education level: Not on file  Occupational History   Occupation: Truck Air traffic controller: RETIRED  Tobacco Use   Smoking status: Former    Types: Cigarettes    Start date: 08/06/1967    Quit date: 08/06/2003    Years since quitting: 19.3   Smokeless tobacco: Never  Vaping Use   Vaping Use: Never used  Substance and Sexual Activity   Alcohol use: No    Alcohol/week: 0.0 standard drinks of alcohol   Drug use: No   Sexual activity: Not Currently  Other Topics Concern   Not on file  Social History Narrative   Not on file   Social Determinants of Health   Financial Resource Strain: Not on file  Food Insecurity: Not on file  Transportation Needs: Not on file  Physical Activity: Not on file  Stress: Not on file  Social Connections: Not on file    Review of Systems: Gen: Denies fever, chills, cold or flulike symptoms, presyncope, syncope. CV: Denies chest pain, palpitations. Resp: Denies dyspnea, cough. GI: See HPI Heme: See HPI.  Physical Exam: BP 118/70 (BP Location: Right Arm, Patient Position: Sitting, Cuff Size: Large)   Pulse 63   Temp 97.9 F (36.6 C) (Temporal)   Ht 6' (1.829 m)   Wt 197 lb 6.4 oz (89.5 kg)   SpO2 96%   BMI 26.77 kg/m  General:   Alert and oriented. No distress noted. Pleasant and cooperative.  Head:  Normocephalic and atraumatic. Eyes:  Conjuctiva clear  without scleral icterus. Heart:  S1, S2 present without murmurs appreciated. Lungs:  Clear to auscultation bilaterally. No wheezes, rales, or rhonchi. No distress.  Abdomen:  +BS, soft, non-tender and non-distended. No rebound or guarding. No HSM or masses noted. Msk:  Symmetrical without gross deformities. Normal posture. Extremities:  Without edema. Neurologic:  Alert and  oriented x4 Psych:  Normal mood and affect.    Assessment:  77 y.o. male presenting today with GI history of  IDA, GERD, suspected perforated duodenal  ulcer in June 2021 with recommendations for follow-up endoscopy in 6-8 weeks, but patient declined, presenting today for 1 yr follow-up.    GERD: Well-controlled on pantoprazole 40 mg daily.  No alarm symptoms.  IDA: Resolved. Most recent labs 12/02/2022 with hemoglobin 14.4 Denies overt GI bleeding. Maintains on oral iron daily and following with hematology.  Colonoscopy up to date in 2021. Suspected perforated duodenal ulcer June 2021 with recommendations for follow-up EGD, but this was never completed as patient declined. He is requesting no further endoscopic evaluation moving forward including surveillance colonoscopy unless he were to develop new symptoms.   History of colon polyps: Due for surveillance colonoscopy in 2026 if health permits, but patient states he is not interested in repeating colonoscopy unless he develops new problems.   Plan:  Continue Protonix 40 mg daily.  Continue following with hematology for history of IDA.  No future colonoscopy unless new symptoms develop per patient's request.  Follow-up in 1 year or sooner if needed.    Ermalinda Memos, PA-C [facility] 12/11/2022

## 2022-12-11 ENCOUNTER — Ambulatory Visit (INDEPENDENT_AMBULATORY_CARE_PROVIDER_SITE_OTHER): Payer: Medicare Other | Admitting: Gastroenterology

## 2022-12-11 ENCOUNTER — Encounter: Payer: Self-pay | Admitting: Gastroenterology

## 2022-12-11 VITALS — BP 118/70 | HR 63 | Temp 97.9°F | Ht 72.0 in | Wt 197.4 lb

## 2022-12-11 DIAGNOSIS — D509 Iron deficiency anemia, unspecified: Secondary | ICD-10-CM

## 2022-12-11 DIAGNOSIS — K219 Gastro-esophageal reflux disease without esophagitis: Secondary | ICD-10-CM

## 2022-12-12 DIAGNOSIS — E1142 Type 2 diabetes mellitus with diabetic polyneuropathy: Secondary | ICD-10-CM | POA: Diagnosis not present

## 2022-12-12 DIAGNOSIS — B351 Tinea unguium: Secondary | ICD-10-CM | POA: Diagnosis not present

## 2022-12-16 ENCOUNTER — Encounter: Payer: Self-pay | Admitting: Gastroenterology

## 2022-12-16 DIAGNOSIS — N1831 Chronic kidney disease, stage 3a: Secondary | ICD-10-CM | POA: Diagnosis not present

## 2022-12-16 DIAGNOSIS — I5042 Chronic combined systolic (congestive) and diastolic (congestive) heart failure: Secondary | ICD-10-CM | POA: Diagnosis not present

## 2022-12-16 DIAGNOSIS — E1122 Type 2 diabetes mellitus with diabetic chronic kidney disease: Secondary | ICD-10-CM | POA: Diagnosis not present

## 2022-12-16 DIAGNOSIS — E875 Hyperkalemia: Secondary | ICD-10-CM | POA: Diagnosis not present

## 2023-01-09 NOTE — Progress Notes (Signed)
Cardiology Office Note  Date: 01/10/2023   ID: [name], DOB [birth year], MRN [id number]  History of Present Illness: Dennis Yu is a 77 y.o. male last seen in November 2023.  He is here for a routine visit.  Reports no change in status, NYHA class II dyspnea, no angina, no palpitations or syncope.  Weight is stable and he does not describe any leg swelling, no orthopnea or PND.  He continues on Coumadin with follow-up in anticoagulation clinic.  Last INR was 2.4 in May.  He denies any spontaneous bleeding problems.  I reviewed the remainder of his cardiac medications which are stable.  He continues to follow with Dr. Margo Yu for primary care.  Current Lasix dosing is 40 mg daily.  He had lab work in April which I also reviewed.  Physical Exam: VS:  BP 132/74   Pulse 60   Ht 6' (1.829 m)   Wt 197 lb 9.6 oz (89.6 kg)   SpO2 95%   BMI 26.80 kg/m , BMI Body mass index is 26.8 kg/m.  Wt Readings from Last 3 Encounters:  01/10/23 197 lb 9.6 oz (89.6 kg)  12/11/22 197 lb 6.4 oz (89.5 kg)  06/14/22 197 lb 12.8 oz (89.7 kg)    General: Patient appears comfortable at rest. HEENT: Conjunctiva and lids normal. Neck: Supple, no elevated JVP or carotid bruits. Lungs: Clear to auscultation, nonlabored breathing at rest. Cardiac: Regular rate and rhythm, no S3, 1/6 systolic murmur. Extremities: No pitting edema.  ECG:  An ECG dated 09/12/2021 was personally reviewed today and demonstrated:  Sinus rhythm with old anterolateral/inferior infarct pattern.  Labwork: 03/21/2022: ALT 35; AST 28; BUN 17; Creatinine, Ser 1.38; Hemoglobin 13.9; Platelets 175; Potassium 4.3; Sodium 142  April 2024: Hemoglobin 14.4, platelets 168, BUN 20, creatinine 1.49, potassium 4.4, AST 29, ALT 29, cholesterol 128, triglycerides 96, HDL 30, LDL 80, hemoglobin A1c 7.2%  Other Studies Reviewed Today:  Echocardiogram 06/25/2022:  1. Left ventricular ejection fraction, by estimation, is 25 to 30%. The   left ventricle has severely decreased function. The left ventricle  demonstrates regional wall motion abnormalities (see scoring  diagram/findings for description). The left  ventricular internal cavity size was mildly to moderately dilated. There  is mild concentric left ventricular hypertrophy. Left ventricular  diastolic parameters are consistent with Grade II diastolic dysfunction  (pseudonormalization).   2. Right ventricular systolic function is normal. The right ventricular  size is normal. Tricuspid regurgitation signal is inadequate for assessing  PA pressure.   3. Left atrial size was moderately dilated.   4. The mitral valve is degenerative. Mild mitral valve regurgitation.   5. The aortic valve is tricuspid. Aortic valve regurgitation is not  visualized. Aortic valve sclerosis is present, with no evidence of aortic  valve stenosis.   6. The inferior vena cava is dilated in size with >50% respiratory  variability, suggesting right atrial pressure of 8 mmHg.   Assessment and Plan:  1.  HFrEF with ischemic cardiomyopathy, LVEF 25 to 30% by echocardiogram in November 2023.  He has declined ICD and prefers conservative medical therapy.  Clinically stable with no evidence of fluid retention and NYHA class II dyspnea.  Continue Coreg, [place], Entresto, and Lasix.  2.  History of stroke and calcified apical LV mural thrombus.  He continues on Coumadin with follow-up in anticoagulation clinic.  3.  CKD stage IIIb, creatinine 1.49 in April.  4.  Mixed hyperlipidemia, LDL 80 in April.  Continue  Lipitor.  Disposition:  Follow up  6 months.  Signed, Jonelle Sidle, M.D., F.A.C.C. [facility]

## 2023-01-10 ENCOUNTER — Encounter: Payer: Self-pay | Admitting: Cardiology

## 2023-01-10 ENCOUNTER — Ambulatory Visit: Payer: Medicare Other | Attending: Cardiology | Admitting: Cardiology

## 2023-01-10 VITALS — BP 132/74 | HR 60 | Ht 72.0 in | Wt 197.6 lb

## 2023-01-10 DIAGNOSIS — I255 Ischemic cardiomyopathy: Secondary | ICD-10-CM | POA: Insufficient documentation

## 2023-01-10 DIAGNOSIS — N1832 Chronic kidney disease, stage 3b: Secondary | ICD-10-CM | POA: Diagnosis not present

## 2023-01-10 DIAGNOSIS — I513 Intracardiac thrombosis, not elsewhere classified: Secondary | ICD-10-CM | POA: Diagnosis not present

## 2023-01-10 DIAGNOSIS — I5020 Unspecified systolic (congestive) heart failure: Secondary | ICD-10-CM | POA: Diagnosis not present

## 2023-01-10 NOTE — Patient Instructions (Signed)
Medication Instructions:  Your physician recommends that you continue on your current medications as directed. Please refer to the Current Medication list given to you today.   Labwork: None today  Testing/Procedures: None today  Follow-Up: 6 months  Any Other Special Instructions Will Be Listed Below (If Applicable).  If you need a refill on your cardiac medications before your next appointment, please call your pharmacy.  

## 2023-01-16 ENCOUNTER — Ambulatory Visit: Payer: Medicare Other | Attending: Cardiology | Admitting: *Deleted

## 2023-01-16 DIAGNOSIS — Z5181 Encounter for therapeutic drug level monitoring: Secondary | ICD-10-CM | POA: Diagnosis not present

## 2023-01-16 DIAGNOSIS — I513 Intracardiac thrombosis, not elsewhere classified: Secondary | ICD-10-CM | POA: Insufficient documentation

## 2023-01-16 DIAGNOSIS — I6350 Cerebral infarction due to unspecified occlusion or stenosis of unspecified cerebral artery: Secondary | ICD-10-CM | POA: Diagnosis not present

## 2023-01-16 LAB — POCT INR: INR: 2.8 (ref 2.0–3.0)

## 2023-01-16 NOTE — Patient Instructions (Signed)
Continue warfarin 1 tablet daily except 1 1/2 tablets on Mondays, Wednesdays and Fridays Recheck in 6 weeks   

## 2023-01-27 ENCOUNTER — Other Ambulatory Visit: Payer: Self-pay | Admitting: Cardiology

## 2023-01-30 DIAGNOSIS — H40011 Open angle with borderline findings, low risk, right eye: Secondary | ICD-10-CM | POA: Diagnosis not present

## 2023-01-30 DIAGNOSIS — H5203 Hypermetropia, bilateral: Secondary | ICD-10-CM | POA: Diagnosis not present

## 2023-01-30 DIAGNOSIS — H01001 Unspecified blepharitis right upper eyelid: Secondary | ICD-10-CM | POA: Diagnosis not present

## 2023-01-30 DIAGNOSIS — H25813 Combined forms of age-related cataract, bilateral: Secondary | ICD-10-CM | POA: Diagnosis not present

## 2023-01-30 DIAGNOSIS — H01002 Unspecified blepharitis right lower eyelid: Secondary | ICD-10-CM | POA: Diagnosis not present

## 2023-02-17 DIAGNOSIS — H25812 Combined forms of age-related cataract, left eye: Secondary | ICD-10-CM | POA: Diagnosis not present

## 2023-02-18 ENCOUNTER — Encounter (HOSPITAL_COMMUNITY): Payer: Self-pay

## 2023-02-18 ENCOUNTER — Encounter (HOSPITAL_COMMUNITY)
Admission: RE | Admit: 2023-02-18 | Discharge: 2023-02-18 | Disposition: A | Discharge status: Home / Self Care | Payer: Medicare Other | Source: Ambulatory Visit | Attending: Ophthalmology | Admitting: Ophthalmology

## 2023-02-18 HISTORY — DX: Dyspnea, unspecified: R06.00

## 2023-02-18 HISTORY — DX: Acute myocardial infarction, unspecified: I21.9

## 2023-02-20 NOTE — H&P (Signed)
Surgical History & Physical  Patient Name: Dennis Yu  DOB: 04-15-1946  Surgery: Cataract extraction with intraocular lens implant phacoemulsification; Left Eye Surgeon: Fabio Pierce MD Surgery Date: 02/24/2023 Pre-Op Date: 01/30/2023  HPI: A 71 Yr. old male patient is referred from [facility] for cataract eval. 1. The patient complains of difficulty when driving/seeing small prints on cell phone, which began 6 months ago. Both eyes are affected. The episode is gradual. The condition's severity increased since last visit. Symptoms occur when the patient is driving and outside. This is negatively affecting the patient's quality of life and the patient is unable to function adequately in life with the current level of vision. Pt is not using eye drops. Last A1c was above 8 taken 11/2022. HPI was performed by Fabio Pierce .  Medical History:  Diabetes Heart Problem High Blood Pressure Stroke  Social Former smoker   Medication Gabapentin, Glipizide, Furosemide, Warfarin, Sherryll Burger, Farxiga  Sx/Procedures None  Drug Allergies  NKDA  Review of Systems Negative Allergic/Immunologic Negative Cardiovascular Negative Constitutional Negative Ear, Nose, Mouth & Throat Negative Endocrine Negative Eyes Negative Gastrointestinal Negative Genitourinary Negative Hematologic/Lymphatic Negative Integumentary Negative Musculoskeletal Negative Neurological Negative Psychiatry Negative Respiratory  History & Physical: Heent: cataracts NECK: supple without bruits LUNGS: lungs clear to auscultation CV: regular rate and rhythm Abdomen: soft and non-tender  Impression & Plan: Assessment: 1.  COMBINED FORMS AGE RELATED CATARACT; Both Eyes (H25.813) 2.  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  BLEPHARITIS; Right Upper Lid, Right Lower Lid, Left Upper Lid, Left Lower Lid (H01.001, H01.002,H01.004,H01.005) 4.  ASTIGMATISM, REGULAR; Both Eyes  (H52.223)  Plan: 1.  Cataract accounts for the patient's decreased vision. This visual impairment is not correctable with a tolerable change in glasses or contact lenses. Cataract surgery with an implantation of a new lens should significantly improve the visual and functional status of the patient. Discussed all risks, benefits, alternatives, and potential complications. Discussed the procedures and recovery. Patient desires to have surgery. A-scan ordered and performed today for intra-ocular lens calculations. The surgery will be performed in order to improve vision for driving, reading, and for eye examinations. Recommend phacoemulsification with intra-ocular lens. Recommend Dextenza for post-operative pain and inflammation. Left Eye worse - first. Dilates poorly - shugarcaine by protocol. Malyugin Ring. Omidira. Recommend toric IOL.  2.  Based on cup-to-disc ratio. Negative Family history. OCT rNFL shows: thinning OD (WNL OS) IOP low normal. Detailed discussion about glaucoma today including importance of maintaining good follow up and following treatment plan, and the possibility of irreversible blindness as part of this disease process.  3.  Blepharitis is present - recommend regular lid cleaning.  4.  Recommend toric IOL OU.

## 2023-02-24 ENCOUNTER — Encounter (HOSPITAL_COMMUNITY): Payer: Self-pay | Admitting: Ophthalmology

## 2023-02-24 ENCOUNTER — Ambulatory Visit (HOSPITAL_COMMUNITY): Payer: Medicare Other | Admitting: Certified Registered"

## 2023-02-24 ENCOUNTER — Encounter (HOSPITAL_COMMUNITY)
Admission: RE | Disposition: A | Discharge status: Home / Self Care | Payer: Self-pay | Source: Home / Self Care | Attending: Ophthalmology

## 2023-02-24 ENCOUNTER — Ambulatory Visit (HOSPITAL_BASED_OUTPATIENT_CLINIC_OR_DEPARTMENT_OTHER): Payer: Medicare Other | Admitting: Certified Registered"

## 2023-02-24 ENCOUNTER — Ambulatory Visit (HOSPITAL_COMMUNITY)
Admission: RE | Admit: 2023-02-24 | Discharge: 2023-02-24 | Disposition: A | Discharge status: Home / Self Care | Payer: Medicare Other | Source: Home / Self Care | Attending: Ophthalmology | Admitting: Ophthalmology

## 2023-02-24 DIAGNOSIS — I5042 Chronic combined systolic (congestive) and diastolic (congestive) heart failure: Secondary | ICD-10-CM

## 2023-02-24 DIAGNOSIS — Z87891 Personal history of nicotine dependence: Secondary | ICD-10-CM | POA: Insufficient documentation

## 2023-02-24 DIAGNOSIS — Z7984 Long term (current) use of oral hypoglycemic drugs: Secondary | ICD-10-CM | POA: Diagnosis not present

## 2023-02-24 DIAGNOSIS — H2181 Floppy iris syndrome: Secondary | ICD-10-CM | POA: Insufficient documentation

## 2023-02-24 DIAGNOSIS — E1136 Type 2 diabetes mellitus with diabetic cataract: Secondary | ICD-10-CM | POA: Insufficient documentation

## 2023-02-24 DIAGNOSIS — I2510 Atherosclerotic heart disease of native coronary artery without angina pectoris: Secondary | ICD-10-CM | POA: Insufficient documentation

## 2023-02-24 DIAGNOSIS — H5712 Ocular pain, left eye: Secondary | ICD-10-CM | POA: Insufficient documentation

## 2023-02-24 DIAGNOSIS — I110 Hypertensive heart disease with heart failure: Secondary | ICD-10-CM

## 2023-02-24 DIAGNOSIS — H25812 Combined forms of age-related cataract, left eye: Secondary | ICD-10-CM | POA: Insufficient documentation

## 2023-02-24 DIAGNOSIS — Z09 Encounter for follow-up examination after completed treatment for conditions other than malignant neoplasm: Secondary | ICD-10-CM | POA: Insufficient documentation

## 2023-02-24 DIAGNOSIS — Z8673 Personal history of transient ischemic attack (TIA), and cerebral infarction without residual deficits: Secondary | ICD-10-CM | POA: Insufficient documentation

## 2023-02-24 DIAGNOSIS — I10 Essential (primary) hypertension: Secondary | ICD-10-CM | POA: Diagnosis not present

## 2023-02-24 DIAGNOSIS — I252 Old myocardial infarction: Secondary | ICD-10-CM | POA: Diagnosis not present

## 2023-02-24 HISTORY — PX: CATARACT EXTRACTION W/PHACO: SHX586

## 2023-02-24 LAB — GLUCOSE, CAPILLARY: Glucose-Capillary: 137 mg/dL — ABNORMAL HIGH (ref 70–99)

## 2023-02-24 SURGERY — PHACOEMULSIFICATION, CATARACT, WITH IOL INSERTION
Anesthesia: Monitor Anesthesia Care | Site: Eye | Laterality: Left

## 2023-02-24 MED ORDER — SODIUM HYALURONATE 23MG/ML IO SOSY
PREFILLED_SYRINGE | INTRAOCULAR | Status: DC | PRN
  Administered 2023-02-24: .6 mL via INTRAOCULAR

## 2023-02-24 MED ORDER — PHENYLEPHRINE HCL 2.5 % OP SOLN
1.0000 [drp] | OPHTHALMIC | Status: AC | PRN
  Administered 2023-02-24 (×3): 1 [drp] via OPHTHALMIC

## 2023-02-24 MED ORDER — LIDOCAINE HCL 3.5 % OP GEL
1.0000 | Freq: Once | OPHTHALMIC | Status: AC
  Administered 2023-02-24: 1 via OPHTHALMIC

## 2023-02-24 MED ORDER — LIDOCAINE HCL (PF) 1 % IJ SOLN
INTRAOCULAR | Status: DC | PRN
  Administered 2023-02-24: 1 mL via OPHTHALMIC

## 2023-02-24 MED ORDER — STERILE WATER FOR IRRIGATION IR SOLN
Status: DC | PRN
  Administered 2023-02-24: 25 mL

## 2023-02-24 MED ORDER — NEOMYCIN-POLYMYXIN-DEXAMETH 3.5-10000-0.1 OP SUSP
OPHTHALMIC | Status: DC | PRN
  Administered 2023-02-24: 2 [drp] via OPHTHALMIC

## 2023-02-24 MED ORDER — DEXAMETHASONE 0.4 MG OP INST
VAGINAL_INSERT | OPHTHALMIC | Status: AC
  Filled 2023-02-24: qty 1

## 2023-02-24 MED ORDER — PHENYLEPHRINE-KETOROLAC 1-0.3 % IO SOLN
INTRAOCULAR | Status: DC | PRN
  Administered 2023-02-24: 500 mL via OPHTHALMIC

## 2023-02-24 MED ORDER — BSS IO SOLN
INTRAOCULAR | Status: DC | PRN
  Administered 2023-02-24: 15 mL via INTRAOCULAR

## 2023-02-24 MED ORDER — DEXAMETHASONE 0.4 MG OP INST
VAGINAL_INSERT | OPHTHALMIC | Status: DC | PRN
  Administered 2023-02-24: .4 mg via OPHTHALMIC

## 2023-02-24 MED ORDER — TETRACAINE HCL 0.5 % OP SOLN
1.0000 [drp] | OPHTHALMIC | Status: AC | PRN
  Administered 2023-02-24 (×3): 1 [drp] via OPHTHALMIC

## 2023-02-24 MED ORDER — POVIDONE-IODINE 5 % OP SOLN
OPHTHALMIC | Status: DC | PRN
  Administered 2023-02-24: 1 via OPHTHALMIC

## 2023-02-24 MED ORDER — TROPICAMIDE 1 % OP SOLN
1.0000 [drp] | OPHTHALMIC | Status: AC | PRN
  Administered 2023-02-24 (×3): 1 [drp] via OPHTHALMIC

## 2023-02-24 MED ORDER — SODIUM HYALURONATE 10 MG/ML IO SOLUTION
PREFILLED_SYRINGE | INTRAOCULAR | Status: DC | PRN
  Administered 2023-02-24: .85 mL via INTRAOCULAR

## 2023-02-24 MED ORDER — EPINEPHRINE PF 1 MG/ML IJ SOLN
INTRAMUSCULAR | Status: AC
  Filled 2023-02-24: qty 1

## 2023-02-24 MED ORDER — PHENYLEPHRINE-KETOROLAC 1-0.3 % IO SOLN
INTRAOCULAR | Status: AC
  Filled 2023-02-24: qty 4

## 2023-02-24 SURGICAL SUPPLY — 13 items
CATARACT SUITE SIGHTPATH (MISCELLANEOUS) ×1 IMPLANT
CLOTH BEACON ORANGE TIMEOUT ST (SAFETY) ×2 IMPLANT
EYE SHIELD UNIVERSAL CLEAR (GAUZE/BANDAGES/DRESSINGS) IMPLANT
FEE CATARACT SUITE SIGHTPATH (MISCELLANEOUS) ×2 IMPLANT
GLOVE BIOGEL PI IND STRL 7.0 (GLOVE) ×2 IMPLANT
LENS IOL TECNIS EYHANCE 29.0 (Intraocular Lens) IMPLANT
NDL HYPO 18GX1.5 BLUNT FILL (NEEDLE) ×2 IMPLANT
NEEDLE HYPO 18GX1.5 BLUNT FILL (NEEDLE) ×1 IMPLANT
PAD ARMBOARD 7.5X6 YLW CONV (MISCELLANEOUS) ×1 IMPLANT
POSITIONER HEAD 8X9X4 ADT (SOFTGOODS) ×2 IMPLANT
SYR TB 1ML LL NO SAFETY (SYRINGE) ×2 IMPLANT
TAPE SURG TRANSPORE 1 IN (GAUZE/BANDAGES/DRESSINGS) IMPLANT
WATER STERILE IRR 250ML POUR (IV SOLUTION) ×1 IMPLANT

## 2023-02-24 NOTE — Discharge Instructions (Signed)
Please discharge patient when stable, will follow up today with Dr. Wrzosek at the [facility] office immediately following discharge.  Leave shield in place until visit.  All paperwork with discharge instructions will be given at the office.  [facility] Address:  [address]  [place], [place] [address]  

## 2023-02-24 NOTE — Anesthesia Procedure Notes (Signed)
Procedure Name: MAC Date/Time: 02/24/2023 10:58 AM  Performed by: Julian Reil, CRNAPre-anesthesia Checklist: Patient identified, Emergency Drugs available, Suction available and Patient being monitored Patient Re-evaluated:Patient Re-evaluated prior to induction Oxygen Delivery Method: Nasal cannula Placement Confirmation: positive ETCO2

## 2023-02-24 NOTE — Anesthesia Preprocedure Evaluation (Signed)
Anesthesia Evaluation  Patient identified by MRN, date of birth, ID band Patient awake    Reviewed: Allergy & Precautions, H&P , NPO status , Patient's Chart, lab work & pertinent test results, reviewed documented beta blocker date and time   Airway Mallampati: II  TM Distance: >3 FB Neck ROM: full    Dental no notable dental hx.    Pulmonary neg pulmonary ROS, shortness of breath, sleep apnea , former smoker   Pulmonary exam normal breath sounds clear to auscultation       Cardiovascular Exercise Tolerance: Good hypertension, + CAD and + Past MI  negative cardio ROS  Rhythm:regular Rate:Normal     Neuro/Psych CVA negative neurological ROS  negative psych ROS   GI/Hepatic negative GI ROS, Neg liver ROS, PUD,GERD  ,,  Endo/Other  negative endocrine ROSdiabetes    Renal/GU negative Renal ROS  negative genitourinary   Musculoskeletal   Abdominal   Peds  Hematology negative hematology ROS (+) Blood dyscrasia, anemia   Anesthesia Other Findings   Reproductive/Obstetrics negative OB ROS                             Anesthesia Physical Anesthesia Plan  ASA: 3  Anesthesia Plan: General and MAC   Post-op Pain Management:    Induction:   PONV Risk Score and Plan:   Airway Management Planned:   Additional Equipment:   Intra-op Plan:   Post-operative Plan:   Informed Consent: I have reviewed the patients History and Physical, chart, labs and discussed the procedure including the risks, benefits and alternatives for the proposed anesthesia with the patient or authorized representative who has indicated his/her understanding and acceptance.     Dental Advisory Given  Plan Discussed with: CRNA  Anesthesia Plan Comments:        Anesthesia Quick Evaluation

## 2023-02-24 NOTE — Transfer of Care (Signed)
Immediate Anesthesia Transfer of Care Note  Patient: Dennis Yu  Procedure(s) Performed: CATARACT EXTRACTION PHACO AND INTRAOCULAR LENS PLACEMENT (IOC) (Left: Eye)  Patient Location: Short Stay  Anesthesia Type:MAC  Level of Consciousness: awake, alert , and oriented  Airway & Oxygen Therapy: Patient Spontanous Breathing  Post-op Assessment: Report given to RN and Post -op Vital signs reviewed and stable  Post vital signs: Reviewed and stable  Last Vitals:  Vitals Value Taken Time  BP    Temp    Pulse    Resp    SpO2      Last Pain:  Vitals:   02/24/23 1005  TempSrc: Oral  PainSc: 0-No pain         Complications: No notable events documented.

## 2023-02-24 NOTE — Op Note (Signed)
Date of procedure: 02/24/23  Pre-operative diagnosis:  Visually significant age-related combined cataract, Left Eye (H25.812) Poor dilation of the left eye  Post-operative diagnosis:  Visually significant age-related combined cataract, Left Eye (H25.812) Intra-operative floppy iris syndrome  Pain and inflammation following cataract surgery, Left Eye (H57.12)  Procedure:  Removal of cataract via phacoemulsification and insertion of intra-ocular lens [employer] DIB00 +29.0D into the capsular bag of the Left Eye 2. Placement of Dextenza Implant, Left Lower Lid  Attending surgeon: Rudy Jew. Whitten Andreoni, MD, MA  Anesthesia: MAC, Topical Akten  Complications: None  Estimated Blood Loss: <70mL (minimal)  Specimens: None  Implants: As above  Indications:  Visually significant age-related cataract, Left Eye  Procedure:  The patient was seen and identified in the pre-operative area. The operative eye was identified and dilated.  The operative eye was marked.  Topical anesthesia was administered to the operative eye.     The patient was then to the operative suite and placed in the supine position.  A timeout was performed confirming the patient, procedure to be performed, and all other relevant information.   The patient's face was prepped and draped in the usual fashion for intra-ocular surgery.  A lid speculum was placed into the operative eye and the surgical microscope moved into place and focused.  An inferotemporal paracentesis was created using a 20 gauge paracentesis blade.  Omidria and Shugarcaine were injected into the anterior chamber.  Viscoelastic was injected into the anterior chamber.  A temporal clear-corneal main wound incision was created using a 2.62mm microkeratome.  A continuous curvilinear capsulorrhexis was initiated using an irrigating cystitome and completed using capsulorrhexis forceps.  Hydrodissection and hydrodeliniation were performed.  Viscoelastic was injected  into the anterior chamber.  A phacoemulsification handpiece and a chopper as a second instrument were used to remove the nucleus and epinucleus. The irrigation/aspiration handpiece was used to remove any remaining cortical material.   The capsular bag was reinflated with viscoelastic, checked, and found to be intact.  The intraocular lens was inserted into the capsular bag.  The irrigation/aspiration handpiece was used to remove any remaining viscoelastic.  The clear corneal wound and paracentesis wounds were then hydrated and checked with Weck-Cels to be watertight.   The lid-speculum was removed. The lower punctum was dilated. A Dextenza implant was placed in the lower canaliculus without complication.   The drape was removed. Maxitrol drops were placed in the operative eye. The patient's face was cleaned with a wet and dry 4x4.   A clear shield was taped over the eye. The patient was taken to the post-operative care unit in good condition, having tolerated the procedure well.  Post-Op Instructions: The patient will follow up at [facility] for a same day post-operative evaluation and will receive all other orders and instructions.

## 2023-02-24 NOTE — Interval H&P Note (Signed)
History and Physical Interval Note:  02/24/2023 10:51 AM  Dennis Yu  has presented today for surgery, with the diagnosis of combined forms age related cataract, left eye.  The various methods of treatment have been discussed with the patient and family. After consideration of risks, benefits and other options for treatment, the patient has consented to  Procedure(s) with comments: CATARACT EXTRACTION PHACO AND INTRAOCULAR LENS PLACEMENT (IOC) (Left) - CDE: as a surgical intervention.  The patient's history has been reviewed, patient examined, no change in status, stable for surgery.  I have reviewed the patient's chart and labs.  Questions were answered to the patient's satisfaction.     Fabio Pierce

## 2023-02-24 NOTE — Anesthesia Postprocedure Evaluation (Signed)
Anesthesia Post Note  Patient: Dennis Yu  Procedure(s) Performed: CATARACT EXTRACTION PHACO AND INTRAOCULAR LENS PLACEMENT (IOC) (Left: Eye)  Patient location during evaluation: Phase II Anesthesia Type: MAC Level of consciousness: awake Pain management: pain level controlled Vital Signs Assessment: post-procedure vital signs reviewed and stable Respiratory status: spontaneous breathing and respiratory function stable Cardiovascular status: blood pressure returned to baseline and stable Postop Assessment: no headache and no apparent nausea or vomiting Anesthetic complications: no Comments: Late entry   No notable events documented.   Last Vitals:  Vitals:   02/24/23 1005 02/24/23 1118  BP: 111/70 106/78  Pulse: (!) 58 (!) 59  Resp: 18 16  Temp: 37.1 C (!) 36.4 C  SpO2: 97% 98%    Last Pain:  Vitals:   02/24/23 1118  TempSrc:   PainSc: 0-No pain                 Windell Norfolk

## 2023-02-26 ENCOUNTER — Encounter (HOSPITAL_COMMUNITY): Payer: Self-pay | Admitting: Ophthalmology

## 2023-02-28 ENCOUNTER — Encounter (HOSPITAL_COMMUNITY): Payer: Self-pay

## 2023-02-28 ENCOUNTER — Encounter (HOSPITAL_COMMUNITY)
Admission: RE | Admit: 2023-02-28 | Discharge: 2023-02-28 | Disposition: A | Discharge status: Home / Self Care | Payer: Medicare Other | Source: Ambulatory Visit | Attending: Ophthalmology | Admitting: Ophthalmology

## 2023-03-03 ENCOUNTER — Ambulatory Visit: Payer: Medicare Other | Attending: Cardiology | Admitting: *Deleted

## 2023-03-03 DIAGNOSIS — I6350 Cerebral infarction due to unspecified occlusion or stenosis of unspecified cerebral artery: Secondary | ICD-10-CM

## 2023-03-03 DIAGNOSIS — Z5181 Encounter for therapeutic drug level monitoring: Secondary | ICD-10-CM | POA: Diagnosis not present

## 2023-03-03 DIAGNOSIS — H25811 Combined forms of age-related cataract, right eye: Secondary | ICD-10-CM | POA: Diagnosis not present

## 2023-03-03 DIAGNOSIS — I513 Intracardiac thrombosis, not elsewhere classified: Secondary | ICD-10-CM | POA: Diagnosis not present

## 2023-03-03 LAB — POCT INR: INR: 2.6 (ref 2.0–3.0)

## 2023-03-03 NOTE — Patient Instructions (Signed)
Continue warfarin 1 tablet daily except 1 1/2 tablets on Mondays, Wednesdays and Fridays Recheck in 6 weeks   

## 2023-03-04 NOTE — H&P (Signed)
Surgical History & Physical  Patient Name: Dennis Yu DOB: Jan 29, 1946  Surgery: Cataract extraction with intraocular lens implant phacoemulsification; Right Eye  Surgeon: Fabio Pierce MD Surgery Date:  03-07-23 Pre-Op Date:  03-03-23  HPI: A 15 Yr. old male patient 1. The patient is returning after cataract surgery. The left eye is affected. Status post cataract surgery, which began 7 days ago: Since the last visit, the affected area is doing well. The patient's vision is improved. Patient is following medication instructions. 2. 2. The patient is returning for a cataract follow-up of the right eye. The patient's vision is blurry. The condition's severity is constant. The complaint is associated with difficulty reading traffic/street/store signs, difficulty driving at night due to halos/glare, and difficulty with glare on bright sunny days. This is negatively affecting the patient's quality of life and the patient is unable to function adequately in life with the current level of vision. HPI was performed by Fabio Pierce .  Medical History: Diabetes Heart Problem High Blood Pressure Stroke  Review of Systems Negative Allergic/Immunologic Negative Cardiovascular Negative Constitutional Negative Ear, Nose, Mouth & Throat Negative Endocrine Negative Eyes Negative Gastrointestinal Negative Genitourinary Negative Hemotologic/Lymphatic Negative Integumentary Negative Musculoskeletal Negative Neurological Negative Psychiatry Negative Respiratory  Social   Former smoker   Medication Prednisolone-moxiflox-bromfen,  Gabapentin, Glipizide, Furosemide, Warfarin, Entresto, Farxiga,   Sx/Procedures Phaco c IOL OS w/ dextenza,   Drug Allergies   NKDA  History & Physical: Heent: cataract; right eye NECK: supple without bruits LUNGS: lungs clear to auscultation CV: regular rate and rhythm Abdomen: soft and non-tender Impression & Plan: Assessment: 1.  CATARACT EXTRACTION  STATUS; Left Eye (Z98.42) 2.  INTRAOCULAR LENS IOL (Z96.1) 3.  COMBINED FORMS AGE RELATED CATARACT; Right Eye (H25.811) 4.  NUCLEAR SCLEROSIS AGE RELATED; Right Eye (H25.11)  Plan: 1.  1 week after cataract surgery. Doing well with improved vision and normal eye pressure. Call with any problems or concerns. Stop drops - Dextenza. Call with any concerning symptoms.  2.  Doing well since surgery  3.  Cataract accounts for the patient's decreased vision. This visual impairment is not correctable with a tolerable change in glasses or contact lenses. Cataract surgery with an implantation of a new lens should significantly improve the visual and functional status of the patient. Discussed all risks, benefits, alternatives, and potential complications. Discussed the procedures and recovery. Patient desires to have surgery. A-scan ordered and performed today for intra-ocular lens calculations. The surgery will be performed in order to improve vision for driving, reading, and for eye examinations. Recommend phacoemulsification with intra-ocular lens. Recommend Dextenza for post-operative pain and inflammation. Right Eye. Surgery required to correct imbalance of vision. Dilates poorly - shugarcaine by protocol. Malyugin Ring.  4.

## 2023-03-07 ENCOUNTER — Ambulatory Visit (HOSPITAL_BASED_OUTPATIENT_CLINIC_OR_DEPARTMENT_OTHER): Payer: Medicare Other | Admitting: Certified Registered"

## 2023-03-07 ENCOUNTER — Ambulatory Visit (HOSPITAL_COMMUNITY)
Admission: RE | Admit: 2023-03-07 | Discharge: 2023-03-07 | Disposition: A | Discharge status: Home / Self Care | Payer: Medicare Other | Attending: Ophthalmology | Admitting: Ophthalmology

## 2023-03-07 ENCOUNTER — Ambulatory Visit (HOSPITAL_COMMUNITY): Payer: Medicare Other | Admitting: Certified Registered"

## 2023-03-07 ENCOUNTER — Encounter (HOSPITAL_COMMUNITY): Payer: Self-pay | Admitting: Ophthalmology

## 2023-03-07 ENCOUNTER — Encounter (HOSPITAL_COMMUNITY)
Admission: RE | Disposition: A | Discharge status: Home / Self Care | Payer: Self-pay | Source: Home / Self Care | Attending: Ophthalmology

## 2023-03-07 DIAGNOSIS — Z8711 Personal history of peptic ulcer disease: Secondary | ICD-10-CM | POA: Insufficient documentation

## 2023-03-07 DIAGNOSIS — E1136 Type 2 diabetes mellitus with diabetic cataract: Secondary | ICD-10-CM | POA: Insufficient documentation

## 2023-03-07 DIAGNOSIS — Z87891 Personal history of nicotine dependence: Secondary | ICD-10-CM

## 2023-03-07 DIAGNOSIS — Z8673 Personal history of transient ischemic attack (TIA), and cerebral infarction without residual deficits: Secondary | ICD-10-CM | POA: Insufficient documentation

## 2023-03-07 DIAGNOSIS — I5042 Chronic combined systolic (congestive) and diastolic (congestive) heart failure: Secondary | ICD-10-CM

## 2023-03-07 DIAGNOSIS — G4730 Sleep apnea, unspecified: Secondary | ICD-10-CM | POA: Diagnosis not present

## 2023-03-07 DIAGNOSIS — I110 Hypertensive heart disease with heart failure: Secondary | ICD-10-CM

## 2023-03-07 DIAGNOSIS — H25811 Combined forms of age-related cataract, right eye: Secondary | ICD-10-CM

## 2023-03-07 DIAGNOSIS — H5711 Ocular pain, right eye: Secondary | ICD-10-CM | POA: Insufficient documentation

## 2023-03-07 DIAGNOSIS — H2181 Floppy iris syndrome: Secondary | ICD-10-CM | POA: Diagnosis not present

## 2023-03-07 DIAGNOSIS — Z7984 Long term (current) use of oral hypoglycemic drugs: Secondary | ICD-10-CM | POA: Diagnosis not present

## 2023-03-07 HISTORY — PX: CATARACT EXTRACTION W/PHACO: SHX586

## 2023-03-07 LAB — GLUCOSE, CAPILLARY: Glucose-Capillary: 142 mg/dL — ABNORMAL HIGH (ref 70–99)

## 2023-03-07 SURGERY — PHACOEMULSIFICATION, CATARACT, WITH IOL INSERTION
Anesthesia: Monitor Anesthesia Care | Site: Eye | Laterality: Right

## 2023-03-07 MED ORDER — DEXAMETHASONE 0.4 MG OP INST
VAGINAL_INSERT | OPHTHALMIC | Status: DC | PRN
  Administered 2023-03-07: .4 mg via OPHTHALMIC

## 2023-03-07 MED ORDER — SODIUM CHLORIDE 0.9% FLUSH
INTRAVENOUS | Status: DC | PRN
  Administered 2023-03-07: 5 mL via INTRAVENOUS

## 2023-03-07 MED ORDER — LIDOCAINE HCL 3.5 % OP GEL
1.0000 | Freq: Once | OPHTHALMIC | Status: AC
  Administered 2023-03-07: 1 via OPHTHALMIC

## 2023-03-07 MED ORDER — EPINEPHRINE PF 1 MG/ML IJ SOLN: INTRAOCULAR | Status: DC | PRN

## 2023-03-07 MED ORDER — MIDAZOLAM HCL 2 MG/2ML IJ SOLN
INTRAMUSCULAR | Status: AC
  Filled 2023-03-07: qty 2

## 2023-03-07 MED ORDER — STERILE WATER FOR IRRIGATION IR SOLN
Status: DC | PRN
  Administered 2023-03-07: 1

## 2023-03-07 MED ORDER — PHENYLEPHRINE HCL 2.5 % OP SOLN
1.0000 [drp] | OPHTHALMIC | Status: AC | PRN
  Administered 2023-03-07 (×3): 1 [drp] via OPHTHALMIC

## 2023-03-07 MED ORDER — DEXAMETHASONE 0.4 MG OP INST
VAGINAL_INSERT | OPHTHALMIC | Status: AC
  Filled 2023-03-07: qty 1

## 2023-03-07 MED ORDER — TROPICAMIDE 1 % OP SOLN
1.0000 [drp] | OPHTHALMIC | Status: AC | PRN
  Administered 2023-03-07 (×3): 1 [drp] via OPHTHALMIC

## 2023-03-07 MED ORDER — LIDOCAINE HCL (PF) 1 % IJ SOLN
INTRAOCULAR | Status: DC | PRN
  Administered 2023-03-07: 1 mL via OPHTHALMIC

## 2023-03-07 MED ORDER — EPINEPHRINE PF 1 MG/ML IJ SOLN
INTRAMUSCULAR | Status: AC
  Filled 2023-03-07: qty 1

## 2023-03-07 MED ORDER — POVIDONE-IODINE 5 % OP SOLN
OPHTHALMIC | Status: DC | PRN
  Administered 2023-03-07: 1 via OPHTHALMIC

## 2023-03-07 MED ORDER — BSS IO SOLN
INTRAOCULAR | Status: DC | PRN
  Administered 2023-03-07: 15 mL via INTRAOCULAR

## 2023-03-07 MED ORDER — PHENYLEPHRINE-KETOROLAC 1-0.3 % IO SOLN
INTRAOCULAR | Status: DC | PRN
  Administered 2023-03-07: 500 mL via OPHTHALMIC

## 2023-03-07 MED ORDER — SODIUM HYALURONATE 10 MG/ML IO SOLUTION
PREFILLED_SYRINGE | INTRAOCULAR | Status: DC | PRN
  Administered 2023-03-07: .85 mL via INTRAOCULAR

## 2023-03-07 MED ORDER — SODIUM HYALURONATE 23MG/ML IO SOSY
PREFILLED_SYRINGE | INTRAOCULAR | Status: DC | PRN
  Administered 2023-03-07: .6 mL via INTRAOCULAR

## 2023-03-07 MED ORDER — PHENYLEPHRINE-KETOROLAC 1-0.3 % IO SOLN
INTRAOCULAR | Status: AC
  Filled 2023-03-07: qty 4

## 2023-03-07 MED ORDER — TETRACAINE HCL 0.5 % OP SOLN
1.0000 [drp] | OPHTHALMIC | Status: AC | PRN
  Administered 2023-03-07 (×3): 1 [drp] via OPHTHALMIC

## 2023-03-07 MED ORDER — MIDAZOLAM HCL 2 MG/2ML IJ SOLN
INTRAMUSCULAR | Status: DC | PRN
  Administered 2023-03-07: .5 mg via INTRAVENOUS

## 2023-03-07 SURGICAL SUPPLY — 13 items
CATARACT SUITE SIGHTPATH (MISCELLANEOUS) ×1
CLOTH BEACON ORANGE TIMEOUT ST (SAFETY) ×2 IMPLANT
EYE SHIELD UNIVERSAL CLEAR (GAUZE/BANDAGES/DRESSINGS) IMPLANT
FEE CATARACT SUITE SIGHTPATH (MISCELLANEOUS) ×2 IMPLANT
GLOVE BIOGEL PI IND STRL 7.0 (GLOVE) ×2 IMPLANT
LENS IOL TECNIS EYHANCE 25.0 (Intraocular Lens) IMPLANT
NDL HYPO 18GX1.5 BLUNT FILL (NEEDLE) ×2 IMPLANT
NEEDLE HYPO 18GX1.5 BLUNT FILL (NEEDLE) ×1
PAD ARMBOARD 7.5X6 YLW CONV (MISCELLANEOUS) ×1 IMPLANT
POSITIONER HEAD 8X9X4 ADT (SOFTGOODS) ×1 IMPLANT
SYR TB 1ML LL NO SAFETY (SYRINGE) ×2 IMPLANT
TAPE SURG TRANSPORE 1 IN (GAUZE/BANDAGES/DRESSINGS) IMPLANT
WATER STERILE IRR 250ML POUR (IV SOLUTION) ×1 IMPLANT

## 2023-03-07 NOTE — Transfer of Care (Signed)
Immediate Anesthesia Transfer of Care Note  Patient: Dennis Yu  Procedure(s) Performed: CATARACT EXTRACTION PHACO AND INTRAOCULAR LENS PLACEMENT WITH PLACEMENT OF CORTICOSTEROID (Right: Eye)  Patient Location: Short Stay  Anesthesia Type:MAC  Level of Consciousness: awake, alert , and oriented  Airway & Oxygen Therapy: Patient Spontanous Breathing  Post-op Assessment: Report given to RN and Post -op Vital signs reviewed and stable  Post vital signs: Reviewed and stable  Last Vitals:  Vitals Value Taken Time  BP    Temp    Pulse    Resp    SpO2      Last Pain:  Vitals:   03/07/23 0701  TempSrc: Oral  PainSc: 0-No pain         Complications: No notable events documented.

## 2023-03-07 NOTE — Anesthesia Postprocedure Evaluation (Signed)
Anesthesia Post Note  Patient: ELVERT CUMPTON  Procedure(s) Performed: CATARACT EXTRACTION PHACO AND INTRAOCULAR LENS PLACEMENT WITH PLACEMENT OF CORTICOSTEROID (Right: Eye)  Patient location during evaluation: Phase II Anesthesia Type: MAC Level of consciousness: awake Pain management: pain level controlled Vital Signs Assessment: post-procedure vital signs reviewed and stable Respiratory status: spontaneous breathing and respiratory function stable Cardiovascular status: blood pressure returned to baseline and stable Postop Assessment: no headache and no apparent nausea or vomiting Anesthetic complications: no Comments: Late entry   No notable events documented.   Last Vitals:  Vitals:   03/07/23 0701 03/07/23 0843  BP: 107/75 117/78  Pulse: 63 62  Resp: (!) 21 20  Temp: 36.8 C (!) 36.4 C  SpO2: 95% 95%    Last Pain:  Vitals:   03/07/23 0843  TempSrc: Oral  PainSc: 0-No pain                 Windell Norfolk

## 2023-03-07 NOTE — Op Note (Signed)
Date of procedure: 03/07/23  Pre-operative diagnosis:  Visually significant combined form age-related cataract, Right Eye (H25.811)  Post-operative diagnosis:   1. Visually significant combined form age-related cataract, Right Eye (H25.811) 2. Pain and inflammation following cataract surgery Right Eye (H57.11)  Procedure:  Removal of cataract via phacoemulsification and insertion of intra-ocular lens Johnson and Johnson DIB00 +25.0D into the capsular bag of the Right Eye 2. Placement of Dextenza insert, Right Eye  Attending surgeon: Rudy Jew. , MD, MA  Anesthesia: MAC, Topical Akten  Complications: None  Estimated Blood Loss: <11mL (minimal)  Specimens: None  Implants: As above  Indications:  Visually significant age-related cataract, Right Eye  Procedure:  The patient was seen and identified in the pre-operative area. The operative eye was identified and dilated.  The operative eye was marked.  Topical anesthesia was administered to the operative eye.     The patient was then to the operative suite and placed in the supine position.  A timeout was performed confirming the patient, procedure to be performed, and all other relevant information.   The patient's face was prepped and draped in the usual fashion for intra-ocular surgery.  A lid speculum was placed into the operative eye and the surgical microscope moved into place and focused.  A superotemporal paracentesis was created using a 20 gauge paracentesis blade.  Shugarcaine was injected into the anterior chamber.  Viscoelastic was injected into the anterior chamber.  A temporal clear-corneal main wound incision was created using a 2.51mm microkeratome.  A continuous curvilinear capsulorrhexis was initiated using an irrigating cystitome and completed using capsulorrhexis forceps.  Hydrodissection and hydrodeliniation were performed.  Viscoelastic was injected into the anterior chamber.  A phacoemulsification handpiece and a  chopper as a second instrument were used to remove the nucleus and epinucleus. The irrigation/aspiration handpiece was used to remove any remaining cortical material.   The capsular bag was reinflated with viscoelastic, checked, and found to be intact.  The intraocular lens was inserted into the capsular bag.  The irrigation/aspiration handpiece was used to remove any remaining viscoelastic.  The clear corneal wound and paracentesis wounds were then hydrated and checked with Weck-Cels to be watertight. 0.75mL of moxifloxacin was injected into the anterior chamber.  The lid-speculum was removed. The lower punctum was dilated. A Dextenza implant was placed in the lower canaliculus without complication.  The drape was removed.  The patient's face was cleaned with a wet and dry 4x4. A clear shield was taped over the eye. The patient was taken to the post-operative care unit in good condition, having tolerated the procedure well.  Post-Op Instructions: The patient will follow up at [facility] for a same day post-operative evaluation and will receive all other orders and instructions.

## 2023-03-07 NOTE — Discharge Instructions (Addendum)
Please discharge patient when stable, will follow up today with Dr. Wrzosek at the [facility] office immediately following discharge.  Leave shield in place until visit.  All paperwork with discharge instructions will be given at the office.  [facility] Address:  [address]  [place], [place] [address]  

## 2023-03-07 NOTE — Interval H&P Note (Signed)
History and Physical Interval Note:  03/07/2023 8:15 AM  [name]  has presented today for surgery, with the diagnosis of combined forms age related cataract, right eye.  The various methods of treatment have been discussed with the patient and family. After consideration of risks, benefits and other options for treatment, the patient has consented to  Procedure(s): CATARACT EXTRACTION PHACO AND INTRAOCULAR LENS PLACEMENT (IOC) (Right) as a surgical intervention.  The patient's history has been reviewed, patient examined, no change in status, stable for surgery.  I have reviewed the patient's chart and labs.  Questions were answered to the patient's satisfaction.     Dennis Yu

## 2023-03-07 NOTE — Anesthesia Preprocedure Evaluation (Addendum)
Anesthesia Evaluation  Patient identified by MRN, date of birth, ID band Patient awake    Reviewed: Allergy & Precautions, H&P , NPO status , Patient's Chart, lab work & pertinent test results, reviewed documented beta blocker date and time   Airway Mallampati: II  TM Distance: >3 FB Neck ROM: full    Dental no notable dental hx.    Pulmonary shortness of breath, sleep apnea , former smoker   Pulmonary exam normal breath sounds clear to auscultation       Cardiovascular Exercise Tolerance: Good hypertension, + CAD and + Past MI   Rhythm:regular Rate:Normal     Neuro/Psych CVA negative neurological ROS  negative psych ROS   GI/Hepatic negative GI ROS, Neg liver ROS, PUD,GERD  ,,  Endo/Other  diabetes    Renal/GU negative Renal ROS  negative genitourinary   Musculoskeletal   Abdominal   Peds  Hematology negative hematology ROS (+) Blood dyscrasia, anemia   Anesthesia Other Findings   Reproductive/Obstetrics negative OB ROS                             Anesthesia Physical Anesthesia Plan  ASA: 3  Anesthesia Plan: MAC   Post-op Pain Management:    Induction:   PONV Risk Score and Plan:   Airway Management Planned:   Additional Equipment:   Intra-op Plan:   Post-operative Plan:   Informed Consent: I have reviewed the patients History and Physical, chart, labs and discussed the procedure including the risks, benefits and alternatives for the proposed anesthesia with the patient or authorized representative who has indicated his/her understanding and acceptance.     Dental Advisory Given  Plan Discussed with: CRNA  Anesthesia Plan Comments:        Anesthesia Quick Evaluation

## 2023-03-07 NOTE — Anesthesia Procedure Notes (Signed)
Procedure Name: MAC Date/Time: 03/07/2023 8:22 AM  Performed by: Julian Reil, CRNAPre-anesthesia Checklist: Patient identified, Emergency Drugs available, Patient being monitored and Suction available Patient Re-evaluated:Patient Re-evaluated prior to induction Oxygen Delivery Method: Nasal cannula Placement Confirmation: positive ETCO2

## 2023-03-10 ENCOUNTER — Encounter (HOSPITAL_COMMUNITY): Payer: Self-pay | Admitting: Ophthalmology

## 2023-03-11 DIAGNOSIS — B351 Tinea unguium: Secondary | ICD-10-CM | POA: Diagnosis not present

## 2023-03-11 DIAGNOSIS — E1142 Type 2 diabetes mellitus with diabetic polyneuropathy: Secondary | ICD-10-CM | POA: Diagnosis not present

## 2023-03-13 ENCOUNTER — Inpatient Hospital Stay: Payer: Medicare Other | Attending: Hematology

## 2023-03-13 DIAGNOSIS — Z7901 Long term (current) use of anticoagulants: Secondary | ICD-10-CM | POA: Diagnosis not present

## 2023-03-13 DIAGNOSIS — D631 Anemia in chronic kidney disease: Secondary | ICD-10-CM | POA: Insufficient documentation

## 2023-03-13 DIAGNOSIS — Z7982 Long term (current) use of aspirin: Secondary | ICD-10-CM | POA: Insufficient documentation

## 2023-03-13 DIAGNOSIS — D509 Iron deficiency anemia, unspecified: Secondary | ICD-10-CM | POA: Insufficient documentation

## 2023-03-13 DIAGNOSIS — Z87891 Personal history of nicotine dependence: Secondary | ICD-10-CM | POA: Diagnosis not present

## 2023-03-13 DIAGNOSIS — Z8673 Personal history of transient ischemic attack (TIA), and cerebral infarction without residual deficits: Secondary | ICD-10-CM | POA: Diagnosis not present

## 2023-03-13 DIAGNOSIS — Z79899 Other long term (current) drug therapy: Secondary | ICD-10-CM | POA: Insufficient documentation

## 2023-03-13 DIAGNOSIS — N1831 Chronic kidney disease, stage 3a: Secondary | ICD-10-CM | POA: Diagnosis not present

## 2023-03-13 LAB — COMPREHENSIVE METABOLIC PANEL
ALT: 25 U/L (ref 0–44)
AST: 23 U/L (ref 15–41)
Albumin: 3.8 g/dL (ref 3.5–5.0)
Alkaline Phosphatase: 71 U/L (ref 38–126)
Anion gap: 9 (ref 5–15)
BUN: 23 mg/dL (ref 8–23)
CO2: 26 mmol/L (ref 22–32)
Calcium: 8.7 mg/dL — ABNORMAL LOW (ref 8.9–10.3)
Chloride: 104 mmol/L (ref 98–111)
Creatinine, Ser: 1.35 mg/dL — ABNORMAL HIGH (ref 0.61–1.24)
GFR, Estimated: 55 mL/min — ABNORMAL LOW (ref >60)
Glucose, Bld: 156 mg/dL — ABNORMAL HIGH (ref 70–99)
Potassium: 4.3 mmol/L (ref 3.5–5.1)
Sodium: 139 mmol/L (ref 135–145)
Total Bilirubin: 0.4 mg/dL (ref 0.3–1.2)
Total Protein: 7.2 g/dL (ref 6.5–8.1)

## 2023-03-13 LAB — CBC WITH DIFFERENTIAL/PLATELET
Abs Immature Granulocytes: 0.03 10*3/uL (ref 0.00–0.07)
Basophils Absolute: 0.1 10*3/uL (ref 0.0–0.1)
Basophils Relative: 1 %
Eosinophils Absolute: 0.2 10*3/uL (ref 0.0–0.5)
Eosinophils Relative: 3 %
HCT: 47.4 % (ref 39.0–52.0)
Hemoglobin: 14.5 g/dL (ref 13.0–17.0)
Immature Granulocytes: 1 %
Lymphocytes Relative: 25 %
Lymphs Abs: 1.6 10*3/uL (ref 0.7–4.0)
MCH: 28 pg (ref 26.0–34.0)
MCHC: 30.6 g/dL (ref 30.0–36.0)
MCV: 91.5 fL (ref 80.0–100.0)
Monocytes Absolute: 0.5 10*3/uL (ref 0.1–1.0)
Monocytes Relative: 8 %
Neutro Abs: 4.1 10*3/uL (ref 1.7–7.7)
Neutrophils Relative %: 62 %
Platelets: 189 10*3/uL (ref 150–400)
RBC: 5.18 MIL/uL (ref 4.22–5.81)
RDW: 13 % (ref 11.5–15.5)
WBC: 6.5 10*3/uL (ref 4.0–10.5)
nRBC: 0 % (ref 0.0–0.2)

## 2023-03-13 LAB — IRON AND TIBC
Iron: 76 ug/dL (ref 45–182)
Saturation Ratios: 24 % (ref 17.9–39.5)
TIBC: 314 ug/dL (ref 250–450)
UIBC: 238 ug/dL

## 2023-03-13 LAB — FERRITIN: Ferritin: 179 ng/mL (ref 24–336)

## 2023-03-14 ENCOUNTER — Other Ambulatory Visit: Payer: Self-pay | Admitting: Cardiology

## 2023-03-20 ENCOUNTER — Inpatient Hospital Stay (HOSPITAL_BASED_OUTPATIENT_CLINIC_OR_DEPARTMENT_OTHER): Payer: Medicare Other | Admitting: Oncology

## 2023-03-20 VITALS — BP 114/88 | HR 60 | Temp 98.2°F | Resp 18 | Wt 200.8 lb

## 2023-03-20 DIAGNOSIS — Z8673 Personal history of transient ischemic attack (TIA), and cerebral infarction without residual deficits: Secondary | ICD-10-CM | POA: Diagnosis not present

## 2023-03-20 DIAGNOSIS — D509 Iron deficiency anemia, unspecified: Secondary | ICD-10-CM | POA: Diagnosis not present

## 2023-03-20 DIAGNOSIS — N1831 Chronic kidney disease, stage 3a: Secondary | ICD-10-CM | POA: Diagnosis not present

## 2023-03-20 DIAGNOSIS — Z7901 Long term (current) use of anticoagulants: Secondary | ICD-10-CM | POA: Diagnosis not present

## 2023-03-20 DIAGNOSIS — Z87891 Personal history of nicotine dependence: Secondary | ICD-10-CM | POA: Diagnosis not present

## 2023-03-20 DIAGNOSIS — D631 Anemia in chronic kidney disease: Secondary | ICD-10-CM | POA: Diagnosis not present

## 2023-03-20 NOTE — Progress Notes (Signed)
[facility] [address], [place] [address]   CLINIC:  Medical Oncology/Hematology  PCP:  Benita Stabile, MD [address] Laurey [place] [address] [phone]   REASON FOR VISIT:  Follow-up for iron deficiency anemia and anemia of chronic kidney disease   PRIOR THERAPY: None   CURRENT THERAPY: None-previously on iron tablets which he stopped.    INTERVAL HISTORY:  Dennis Yu 77 y.o. male returns for routine follow-up of his iron deficiency anemia.  He was last seen by Rojelio Brenner PA-C on 03/28/2022.   In the interim, he had cataract lens replacement in both eyes (02/24/2023 left eye and 03/07/23 right eye) and tolerated procedures well.  Today, he reports feeling well.  No recent hospitalizations or changes in baseline health.  He is not taking iron tabs.  He is unsure when he stopped them but he is currently not taking iron.  Energy and appetite are 100%. He remains on Coumadin due to history of CVA but denies any bleeding episodes.  No epistaxis, hematemesis, hematochezia or melena.  Denies any ice pica, headaches, chest pain, dyspnea, lightheadedness or syncope he has chronic cough and shortness of breath related to chronic bronchitis.  REVIEW OF SYSTEMS:  Review of Systems  Constitutional:  Negative for fatigue.  Respiratory:  Positive for shortness of breath.     PAST MEDICAL/SURGICAL HISTORY:  Past Medical History:  Diagnosis Date   Bronchitis 11/2012   Cardiomyopathy    LVEF 25%   Dyspnea    Erectile dysfunction    Essential hypertension    GERD (gastroesophageal reflux disease)    History of colonic polyps    Mixed hyperlipidemia    Mural thrombus of left ventricle    Myocardial infarction [facility])    Obstructive sleep apnea    Patent foramen ovale    Perforated duodenal ulcer (HCC) 01/2020   recommended repeat EGD 6-8 week, but patient declined.   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   Superficial  phlebitis 12/10/2012   Type 2 diabetes mellitus (HCC)    Vitamin D deficiency    Past Surgical History:  Procedure Laterality Date   BIOPSY  01/11/2020   Procedure: BIOPSY;  Surgeon: Corbin Ade, MD;  Location: [facility] SUITE;  Service: Endoscopy;;   CATARACT EXTRACTION W/PHACO Left 02/24/2023   Procedure: CATARACT EXTRACTION PHACO AND INTRAOCULAR LENS PLACEMENT (IOC) with Placement of Corticosteriod;  Surgeon: Fabio Pierce, MD;  Location: [facility] ORS;  Service: Ophthalmology;  Laterality: Left;  CDE: 8.21   CATARACT EXTRACTION W/PHACO Right 03/07/2023   Procedure: CATARACT EXTRACTION PHACO AND INTRAOCULAR LENS PLACEMENT WITH PLACEMENT OF CORTICOSTEROID;  Surgeon: Fabio Pierce, MD;  Location: [facility] ORS;  Service: Ophthalmology;  Laterality: Right;  CDE 7.06   COLONOSCOPY WITH PROPOFOL N/A 01/11/2020   Surgeon: Corbin Ade, MD; diverticular disease, single sessile serrated polyp without cytologic dysplasia and negative colon biopsies.  Recommended 5 year repeat if health permits. *Patient requesting no future colonoscopy.   POLYPECTOMY  01/11/2020   Procedure: POLYPECTOMY;  Surgeon: Corbin Ade, MD;  Location: [facility];  Service: Endoscopy;;     SOCIAL HISTORY:  Social History   Socioeconomic History   Marital status: Married    Spouse name: Not on file   Number of children: Not on file   Years of education: Not on file   Highest education level: Not on file  Occupational History  Occupation: Truck Air traffic controller: RETIRED  Tobacco Use   Smoking status: Former    Current packs/day: 0.00    Types: Cigarettes    Start date: 08/06/1967    Quit date: 08/06/2003    Years since quitting: 19.6   Smokeless tobacco: Never  Vaping Use   Vaping status: Never Used  Substance and Sexual Activity   Alcohol use: No    Alcohol/week: 0.0 standard drinks of alcohol   Drug use: No   Sexual activity: Not Currently  Other Topics Concern   Not on file  Social History Narrative    Not on file   Social Determinants of Health   Financial Resource Strain: Not on file  Food Insecurity: Not on file  Transportation Needs: Not on file  Physical Activity: Not on file  Stress: Not on file  Social Connections: Not on file  Intimate Partner Violence: Not At Risk (09/08/2020)   Humiliation, Afraid, Rape, and Kick questionnaire    Fear of Current or Ex-Partner: No    Emotionally Abused: No    Physically Abused: No    Sexually Abused: No    FAMILY HISTORY:  Family History  Problem Relation Age of Onset   Diabetes Father    Hypertension Other    Colon cancer Neg Hx     CURRENT MEDICATIONS:  Outpatient Encounter Medications as of 03/20/2023  Medication Sig   aspirin 81 MG tablet Take 81 mg by mouth daily.   atorvastatin (LIPITOR) 40 MG tablet Take 40 mg by mouth daily.   carvedilol (COREG) 6.25 MG tablet TAKE 1 TABLET TWICE DAILY  WITH MEALS   FARXIGA 10 MG TABS tablet Take 10 mg by mouth daily.   furosemide (LASIX) 40 MG tablet TAKE 1 AND 1/2 TABLETS DAILY   gabapentin (NEURONTIN) 300 MG capsule Take 3 capsules (900 mg total) by mouth daily. (Patient taking differently: Take 300-600 mg by mouth See admin instructions. 300 mg during the day, 600 mg at bedtime)   glipiZIDE (GLUCOTROL) 5 MG tablet TAKE 1 TABLET AT DINNER FORSUGARS (STOP METFORMIN).   NON FORMULARY legatrin pm Daily   Omega-3 Fatty Acids (FISH OIL) 1000 MG CAPS Take 1,000 mg by mouth daily.   pantoprazole (PROTONIX) 40 MG tablet Take 1 tablet (40 mg total) by mouth daily.   PROAIR HFA 108 (90 BASE) MCG/ACT inhaler Inhale 2 puffs into the lungs every 4 (four) hours as needed for wheezing or shortness of breath.    sacubitril-valsartan (ENTRESTO) 24-26 MG Take 1 tablet by mouth 2 (two) times daily.   warfarin (JANTOVEN) 5 MG tablet TAKE 1 TABLET DAILY EXCEPT  1 AND 1/2 TABLETS         MONDAY, WEDNESDAY,    AND  FRIDAY OR AS DIRECTED   No facility-administered encounter medications on file as of 03/20/2023.     ALLERGIES:  No Known Allergies   PHYSICAL EXAM:  ECOG PERFORMANCE STATUS: 1 - Symptomatic but completely ambulatory  There were no vitals filed for this visit. There were no vitals filed for this visit. Physical Exam Constitutional:      Appearance: Normal appearance.  Cardiovascular:     Rate and Rhythm: Normal rate and regular rhythm.  Pulmonary:     Effort: Pulmonary effort is normal.     Breath sounds: Normal breath sounds.  Abdominal:     General: Bowel sounds are normal.     Palpations: Abdomen is soft.  Musculoskeletal:  General: Normal range of motion.  Neurological:     Mental Status: He is alert and oriented to person, place, and time. Mental status is at baseline.    LABORATORY DATA:  I have reviewed the labs as listed.  CBC    Component Value Date/Time   WBC 6.5 03/13/2023 0815   RBC 5.18 03/13/2023 0815   HGB 14.5 03/13/2023 0815   HCT 47.4 03/13/2023 0815   PLT 189 03/13/2023 0815   MCV 91.5 03/13/2023 0815   MCH 28.0 03/13/2023 0815   MCHC 30.6 03/13/2023 0815   RDW 13.0 03/13/2023 0815   LYMPHSABS 1.6 03/13/2023 0815   MONOABS 0.5 03/13/2023 0815   EOSABS 0.2 03/13/2023 0815   BASOSABS 0.1 03/13/2023 0815      Latest Ref Rng & Units 03/13/2023    8:15 AM 03/21/2022    8:37 AM 12/11/2021   11:09 AM  CMP  Glucose 70 - 99 mg/dL 469  629  528   BUN 8 - 23 mg/dL 23  17  21    Creatinine 0.61 - 1.24 mg/dL 4.13  2.44  0.10   Sodium 135 - 145 mmol/L 139  142  138   Potassium 3.5 - 5.1 mmol/L 4.3  4.3  4.5   Chloride 98 - 111 mmol/L 104  111  109   CO2 22 - 32 mmol/L 26  27  23    Calcium 8.9 - 10.3 mg/dL 8.7  8.7  8.7   Total Protein 6.5 - 8.1 g/dL 7.2  7.3    Total Bilirubin 0.3 - 1.2 mg/dL 0.4  0.6    Alkaline Phos 38 - 126 U/L 71  62    AST 15 - 41 U/L 23  28    ALT 0 - 44 U/L 25  35      DIAGNOSTIC IMAGING:  I have independently reviewed the relevant imaging and discussed with the patient.  ASSESSMENT & PLAN: 1.  Normocytic  anemia - Normocytic anemia secondary to iron deficiency and anemia of CKD. - Colonoscopy on 01/11/2020 with diverticulosis in the sigmoid colon and the descending colon.  5 mm polyp in the ascending colon resected.  Nonbleeding internal hemorrhoids. - SPEP & UPEP checked by nephrologist was negative for monoclonal immunoglobulin or free light chain - No bright red blood per rectum or melena  - Not currently taking oral iron.  Unclear of when he stopped.  - Most recent labs from 03/13/2023 show hemoglobin of 14.5, MCV 91.5, ferritin 179 with iron saturations 24%. - PLAN:  No indications for IV iron at this time.  Continue to hold iron tabs at this time..  Return to clinic in 12 months for repeat labs and phone visit.   2.  CKD stage IIIa/b - Patient is established with nephrologist, baseline creatinine appears to be 1.3-1.8 -Creatinine 1.35 today and baseline. - PLAN: Continue follow-up with nephrology (Dr. Wolfgang Phoenix)   3.  History of superficial thrombophlebitis of right lower extremity - Patient had 3-4 episodes of superficial phlebitis without extension to the deep veins, starting in 2009 - Patient was started on Coumadin due to his CVA, and has not had any episodes of recurrent phlebitis since starting Coumadin   PLAN SUMMARY: >> Continue to hold oral iron at this time. >> Return to clinic in 1 year for follow-up with lab work a few days before and telephone visit.    All questions were answered. The patient knows to call the clinic with any problems, questions or concerns.  Medical decision making: Low   Time spent on visit: I spent 15 minutes counseling the patient face to face. The total time spent in the appointment was 25 minutes and more than 50% was on counseling.   Dennis Kaufmann, NP  03/28/2022 9:06 AM

## 2023-04-02 DIAGNOSIS — E1122 Type 2 diabetes mellitus with diabetic chronic kidney disease: Secondary | ICD-10-CM | POA: Diagnosis not present

## 2023-04-02 DIAGNOSIS — E782 Mixed hyperlipidemia: Secondary | ICD-10-CM | POA: Diagnosis not present

## 2023-04-03 DIAGNOSIS — H52223 Regular astigmatism, bilateral: Secondary | ICD-10-CM | POA: Diagnosis not present

## 2023-04-08 DIAGNOSIS — N1831 Chronic kidney disease, stage 3a: Secondary | ICD-10-CM | POA: Diagnosis not present

## 2023-04-08 DIAGNOSIS — I130 Hypertensive heart and chronic kidney disease with heart failure and stage 1 through stage 4 chronic kidney disease, or unspecified chronic kidney disease: Secondary | ICD-10-CM | POA: Diagnosis not present

## 2023-04-08 DIAGNOSIS — I129 Hypertensive chronic kidney disease with stage 1 through stage 4 chronic kidney disease, or unspecified chronic kidney disease: Secondary | ICD-10-CM | POA: Diagnosis not present

## 2023-04-08 DIAGNOSIS — D509 Iron deficiency anemia, unspecified: Secondary | ICD-10-CM | POA: Diagnosis not present

## 2023-04-08 DIAGNOSIS — E1122 Type 2 diabetes mellitus with diabetic chronic kidney disease: Secondary | ICD-10-CM | POA: Diagnosis not present

## 2023-04-08 DIAGNOSIS — Z8673 Personal history of transient ischemic attack (TIA), and cerebral infarction without residual deficits: Secondary | ICD-10-CM | POA: Diagnosis not present

## 2023-04-08 DIAGNOSIS — G9009 Other idiopathic peripheral autonomic neuropathy: Secondary | ICD-10-CM | POA: Diagnosis not present

## 2023-04-08 DIAGNOSIS — G4733 Obstructive sleep apnea (adult) (pediatric): Secondary | ICD-10-CM | POA: Diagnosis not present

## 2023-04-08 DIAGNOSIS — I482 Chronic atrial fibrillation, unspecified: Secondary | ICD-10-CM | POA: Diagnosis not present

## 2023-04-08 DIAGNOSIS — E782 Mixed hyperlipidemia: Secondary | ICD-10-CM | POA: Diagnosis not present

## 2023-04-08 DIAGNOSIS — E663 Overweight: Secondary | ICD-10-CM | POA: Diagnosis not present

## 2023-04-08 DIAGNOSIS — I5022 Chronic systolic (congestive) heart failure: Secondary | ICD-10-CM | POA: Diagnosis not present

## 2023-04-10 ENCOUNTER — Other Ambulatory Visit: Payer: Self-pay | Admitting: *Deleted

## 2023-04-10 MED ORDER — WARFARIN SODIUM 5 MG PO TABS: ORAL_TABLET | ORAL | 3 refills | Status: DC

## 2023-04-10 NOTE — Telephone Encounter (Signed)
Refill request for warfarin:  Last INR was 2.6 on 03/03/23 Next INR due 04/14/23 LOV was 01/10/23  Refill approved.

## 2023-04-14 ENCOUNTER — Ambulatory Visit: Payer: Medicare Other | Attending: Cardiology | Admitting: *Deleted

## 2023-04-14 DIAGNOSIS — Z5181 Encounter for therapeutic drug level monitoring: Secondary | ICD-10-CM | POA: Diagnosis not present

## 2023-04-14 DIAGNOSIS — I513 Intracardiac thrombosis, not elsewhere classified: Secondary | ICD-10-CM

## 2023-04-14 DIAGNOSIS — I6350 Cerebral infarction due to unspecified occlusion or stenosis of unspecified cerebral artery: Secondary | ICD-10-CM | POA: Diagnosis not present

## 2023-04-14 LAB — POCT INR: INR: 2.5 (ref 2.0–3.0)

## 2023-04-14 NOTE — Patient Instructions (Signed)
Continue warfarin 1 tablet daily except 1 1/2 tablets on Mondays, Wednesdays and Fridays Recheck in 6 weeks   

## 2023-04-21 DIAGNOSIS — I5042 Chronic combined systolic (congestive) and diastolic (congestive) heart failure: Secondary | ICD-10-CM | POA: Diagnosis not present

## 2023-04-21 DIAGNOSIS — E875 Hyperkalemia: Secondary | ICD-10-CM | POA: Diagnosis not present

## 2023-04-21 DIAGNOSIS — E1122 Type 2 diabetes mellitus with diabetic chronic kidney disease: Secondary | ICD-10-CM | POA: Diagnosis not present

## 2023-04-21 DIAGNOSIS — N1831 Chronic kidney disease, stage 3a: Secondary | ICD-10-CM | POA: Diagnosis not present

## 2023-05-20 DIAGNOSIS — E1142 Type 2 diabetes mellitus with diabetic polyneuropathy: Secondary | ICD-10-CM | POA: Diagnosis not present

## 2023-05-20 DIAGNOSIS — B351 Tinea unguium: Secondary | ICD-10-CM | POA: Diagnosis not present

## 2023-05-26 ENCOUNTER — Ambulatory Visit: Payer: Medicare Other | Attending: Cardiology | Admitting: *Deleted

## 2023-05-26 DIAGNOSIS — Z5181 Encounter for therapeutic drug level monitoring: Secondary | ICD-10-CM | POA: Diagnosis not present

## 2023-05-26 DIAGNOSIS — I6350 Cerebral infarction due to unspecified occlusion or stenosis of unspecified cerebral artery: Secondary | ICD-10-CM

## 2023-05-26 DIAGNOSIS — I513 Intracardiac thrombosis, not elsewhere classified: Secondary | ICD-10-CM | POA: Diagnosis not present

## 2023-05-26 LAB — POCT INR: INR: 2.4 (ref 2.0–3.0)

## 2023-05-26 NOTE — Patient Instructions (Signed)
Continue warfarin 1 tablet daily except 1 1/2 tablets on Mondays, Wednesdays and Fridays Recheck in 6 weeks   

## 2023-05-27 ENCOUNTER — Other Ambulatory Visit: Payer: Self-pay | Admitting: Gastroenterology

## 2023-06-02 ENCOUNTER — Other Ambulatory Visit: Payer: Self-pay

## 2023-06-02 NOTE — Progress Notes (Signed)
06/02/2023  Patient ID: Dennis Yu, male   DOB: 23-Aug-1945, 77 y.o.   MRN: [id number]    06/02/2023  Dennis Yu [birth year] [id number]   2025 Medication Assistance Renewal Application Summary:  Patient was identified for medication assistance renewal for 2025. Per chart review, patient's PAP application was recently completed and scanned in PCP's EMR. Patient also received medication assistance for Entresto that was completed by Dr. Ival Bible clinical staff last November per telephone note on 07/03/2022. No additional medication assistance opportunities identified.    Plan: I will reach out to cardiologist clinic to ensure Entresto PAP will be renewed by their clinic for 2025. Will also follow on status of PAP application from PCP to ensure patientis appro  Thank you for allowing pharmacy to be a part of this patient's care.  Cephus Shelling, PharmD Clinical Pharmacist [facility] Cell: [phone]

## 2023-07-07 ENCOUNTER — Ambulatory Visit: Payer: Medicare Other | Attending: Cardiology | Admitting: *Deleted

## 2023-07-07 DIAGNOSIS — I513 Intracardiac thrombosis, not elsewhere classified: Secondary | ICD-10-CM | POA: Insufficient documentation

## 2023-07-07 DIAGNOSIS — Z5181 Encounter for therapeutic drug level monitoring: Secondary | ICD-10-CM | POA: Insufficient documentation

## 2023-07-07 DIAGNOSIS — I6350 Cerebral infarction due to unspecified occlusion or stenosis of unspecified cerebral artery: Secondary | ICD-10-CM | POA: Insufficient documentation

## 2023-07-07 LAB — POCT INR: INR: 2.8 (ref 2.0–3.0)

## 2023-07-07 NOTE — Patient Instructions (Signed)
Continue warfarin 1 tablet daily except 1 1/2 tablets on Mondays, Wednesdays and Fridays Recheck in 6 weeks   

## 2023-07-14 ENCOUNTER — Telehealth: Payer: Self-pay | Admitting: Cardiology

## 2023-07-14 MED ORDER — SACUBITRIL-VALSARTAN 24-26 MG PO TABS: 1.0000 | ORAL_TABLET | Freq: Two times a day (BID) | ORAL | 3 refills | Status: DC

## 2023-07-14 NOTE — Telephone Encounter (Signed)
*  STAT* If patient is at the pharmacy, call can be transferred to refill team.   1. Which medications need to be refilled? (please list name of each medication and dose if known)   sacubitril-valsartan (ENTRESTO) 24-26 MG   2. Would you like to learn more about the convenience, safety, & potential cost savings by using the [facility]?   3. Are you open to using the [facility] (Type [facility]. ).  4. Which pharmacy/location (including street and city if local pharmacy) is medication to be sent to?    [facility] [place], [place] - [address]  [employer] [facility] - ph# [phone]  5. Do they need a 30 day or 90 day supply?  90 day  Wife Alona Bene) stated patient is completely out of this medication.  Wife wants call back to confirm prescription has been sent.

## 2023-07-14 NOTE — Telephone Encounter (Signed)
Refill request sent to [facility] pharmacy per pt's request.

## 2023-07-15 ENCOUNTER — Ambulatory Visit: Payer: Medicare Other | Attending: Cardiology | Admitting: Cardiology

## 2023-07-15 ENCOUNTER — Encounter: Payer: Self-pay | Admitting: Cardiology

## 2023-07-15 VITALS — BP 120/72 | HR 69 | Ht 72.0 in | Wt 198.2 lb

## 2023-07-15 DIAGNOSIS — I255 Ischemic cardiomyopathy: Secondary | ICD-10-CM | POA: Insufficient documentation

## 2023-07-15 DIAGNOSIS — I513 Intracardiac thrombosis, not elsewhere classified: Secondary | ICD-10-CM | POA: Diagnosis not present

## 2023-07-15 DIAGNOSIS — I5020 Unspecified systolic (congestive) heart failure: Secondary | ICD-10-CM | POA: Insufficient documentation

## 2023-07-15 DIAGNOSIS — N1832 Chronic kidney disease, stage 3b: Secondary | ICD-10-CM | POA: Insufficient documentation

## 2023-07-15 NOTE — Progress Notes (Signed)
    Cardiology Office Note  Date: 07/15/2023   ID: Dennis, Yu 03-05-1946, MRN [id number]  History of Present Illness: Dennis Yu is a 77 y.o. male last seen in June.  He is here for a follow-up visit.  He does not report any major change in stamina.  NYHA class II dyspnea with typical activities, no leg swelling, no orthopnea or PND.  He has had no sudden dizziness or unexplained syncope.  Does mention trouble with his balance in general, has been using a cane more regularly.  I reviewed his medications.  Current cardiac regimen includes aspirin, Lipitor, Coreg, Farxiga, Lasix, and Entresto. He remains on Coumadin with follow-up in the anticoagulation clinic.  I reviewed his lab work from August as noted below.  We discussed getting a follow-up echocardiogram.  Physical Exam: VS:  BP 120/72   Pulse 69   Ht 6' (1.829 m)   Wt 198 lb 3.2 oz (89.9 kg)   SpO2 98%   BMI 26.88 kg/m , BMI Body mass index is 26.88 kg/m.  Wt Readings from Last 3 Encounters:  07/15/23 198 lb 3.2 oz (89.9 kg)  03/20/23 200 lb 13.4 oz (91.1 kg)  03/07/23 197 lb 15.6 oz (89.8 kg)    General: Patient appears comfortable at rest. HEENT: Conjunctiva and lids normal. Neck: Supple, no elevated JVP or carotid bruits. Lungs: Clear to auscultation, nonlabored breathing at rest. Cardiac: Regular rate and rhythm, no S3, 2/6 systolic murmur. Extremities: No pitting edema.  ECG:  An ECG dated 01/10/2023 was personally reviewed today and demonstrated:  Sinus bradycardia with old anterior infarct pattern and nonspecific ST-T wave abnormalities.  Labwork: August 2024: Cholesterol 126, triglycerides 118, HDL 28, LDL 76 03/13/2023: ALT 25; AST 23; BUN 23; Creatinine, Ser 1.35; Hemoglobin 14.5; Platelets 189; Potassium 4.3; Sodium 139  August 2024: Hemoglobin 13.8, platelets 197, BUN 21, creatinine 1.45, potassium 4.2, AST 24, ALT 26, hemoglobin A1c 7.9%  Other Studies Reviewed Today:  No interval cardiac  testing for review today.  Assessment and Plan:  1.  HFrEF with ischemic cardiomyopathy, LVEF 25 to 30% by echocardiogram in November 2023.  He has declined ICD and prefers conservative medical therapy.  No major change in status, weight is stable and he reports NYHA class II dyspnea with typical activities.  Continue Coreg, Farxiga, Lasix, and Entresto.  He is not on MRA given fluctuating renal insufficiency with CKD stage IIIb.  Update echocardiogram.   2.  History of stroke and calcified apical LV mural thrombus.  He continues on Coumadin with follow-up in anticoagulation clinic.  No spontaneous bleeding problems.  Recent INR 2.8.   3.  CKD stage IIIb, creatinine 1.45 in August.   4.  Mixed hyperlipidemia, LDL 76 in August.  Continue Lipitor.  Disposition:  Follow up  6 months.  Signed, Jonelle Sidle, M.D., F.A.C.C. [name]

## 2023-07-15 NOTE — Patient Instructions (Signed)
 Medication Instructions:  Your physician recommends that you continue on your current medications as directed. Please refer to the Current Medication list given to you today.   Labwork: None today  Testing/Procedures: Your physician has requested that you have an echocardiogram. Echocardiography is a painless test that uses sound waves to create images of your heart. It provides your doctor with information about the size and shape of your heart and how well your heart's chambers and valves are working. This procedure takes approximately one hour. There are no restrictions for this procedure. Please do NOT wear cologne, perfume, aftershave, or lotions (deodorant is allowed). Please arrive 15 minutes prior to your appointment time.  Please note: We ask at that you not bring children with you during ultrasound (echo/ vascular) testing. Due to room size and safety concerns, children are not allowed in the ultrasound rooms during exams. Our front office staff cannot provide observation of children in our lobby area while testing is being conducted. An adult accompanying a patient to their appointment will only be allowed in the ultrasound room at the discretion of the ultrasound technician under special circumstances. We apologize for any inconvenience.   Follow-Up: 6 months  Any Other Special Instructions Will Be Listed Below (If Applicable).  If you need a refill on your cardiac medications before your next appointment, please call your pharmacy.

## 2023-07-17 DIAGNOSIS — E782 Mixed hyperlipidemia: Secondary | ICD-10-CM | POA: Diagnosis not present

## 2023-07-17 DIAGNOSIS — Z125 Encounter for screening for malignant neoplasm of prostate: Secondary | ICD-10-CM | POA: Diagnosis not present

## 2023-07-17 DIAGNOSIS — E1122 Type 2 diabetes mellitus with diabetic chronic kidney disease: Secondary | ICD-10-CM | POA: Diagnosis not present

## 2023-07-23 ENCOUNTER — Other Ambulatory Visit (HOSPITAL_COMMUNITY): Payer: Medicare Other

## 2023-07-23 DIAGNOSIS — G4733 Obstructive sleep apnea (adult) (pediatric): Secondary | ICD-10-CM | POA: Diagnosis not present

## 2023-07-23 DIAGNOSIS — I482 Chronic atrial fibrillation, unspecified: Secondary | ICD-10-CM | POA: Diagnosis not present

## 2023-07-23 DIAGNOSIS — D509 Iron deficiency anemia, unspecified: Secondary | ICD-10-CM | POA: Diagnosis not present

## 2023-07-23 DIAGNOSIS — N1831 Chronic kidney disease, stage 3a: Secondary | ICD-10-CM | POA: Diagnosis not present

## 2023-07-23 DIAGNOSIS — I130 Hypertensive heart and chronic kidney disease with heart failure and stage 1 through stage 4 chronic kidney disease, or unspecified chronic kidney disease: Secondary | ICD-10-CM | POA: Diagnosis not present

## 2023-07-23 DIAGNOSIS — G9009 Other idiopathic peripheral autonomic neuropathy: Secondary | ICD-10-CM | POA: Diagnosis not present

## 2023-07-23 DIAGNOSIS — Z23 Encounter for immunization: Secondary | ICD-10-CM | POA: Diagnosis not present

## 2023-07-23 DIAGNOSIS — E1122 Type 2 diabetes mellitus with diabetic chronic kidney disease: Secondary | ICD-10-CM | POA: Diagnosis not present

## 2023-07-23 DIAGNOSIS — Z8673 Personal history of transient ischemic attack (TIA), and cerebral infarction without residual deficits: Secondary | ICD-10-CM | POA: Diagnosis not present

## 2023-07-23 DIAGNOSIS — I5022 Chronic systolic (congestive) heart failure: Secondary | ICD-10-CM | POA: Diagnosis not present

## 2023-07-23 DIAGNOSIS — I129 Hypertensive chronic kidney disease with stage 1 through stage 4 chronic kidney disease, or unspecified chronic kidney disease: Secondary | ICD-10-CM | POA: Diagnosis not present

## 2023-07-23 DIAGNOSIS — E782 Mixed hyperlipidemia: Secondary | ICD-10-CM | POA: Diagnosis not present

## 2023-07-24 ENCOUNTER — Ambulatory Visit (HOSPITAL_COMMUNITY)
Admission: RE | Admit: 2023-07-24 | Discharge: 2023-07-24 | Disposition: A | Discharge status: Home / Self Care | Payer: Medicare Other | Source: Ambulatory Visit | Attending: Cardiology | Admitting: Cardiology

## 2023-07-24 ENCOUNTER — Encounter: Payer: Self-pay | Admitting: Internal Medicine

## 2023-07-24 DIAGNOSIS — I428 Other cardiomyopathies: Secondary | ICD-10-CM | POA: Diagnosis not present

## 2023-07-24 DIAGNOSIS — I5020 Unspecified systolic (congestive) heart failure: Secondary | ICD-10-CM

## 2023-07-24 LAB — ECHOCARDIOGRAM COMPLETE
Area-P 1/2: 3.6 cm2
MV M vel: 4.27 m/s
MV Peak grad: 72.9 mm[Hg]
S' Lateral: 4.65 cm

## 2023-07-24 NOTE — Progress Notes (Signed)
*  PRELIMINARY RESULTS* Echocardiogram 2D Echocardiogram has been performed.  Stacey Drain 07/24/2023, 4:07 PM

## 2023-07-28 ENCOUNTER — Telehealth: Payer: Self-pay | Admitting: Cardiology

## 2023-07-28 NOTE — Telephone Encounter (Signed)
Wife Alona Bene notified of test results.

## 2023-07-28 NOTE — Telephone Encounter (Signed)
Wife is returning call for results. Please advise

## 2023-08-18 ENCOUNTER — Ambulatory Visit: Payer: Medicare Other | Attending: Cardiology | Admitting: *Deleted

## 2023-08-18 DIAGNOSIS — I6350 Cerebral infarction due to unspecified occlusion or stenosis of unspecified cerebral artery: Secondary | ICD-10-CM | POA: Diagnosis not present

## 2023-08-18 DIAGNOSIS — Z5181 Encounter for therapeutic drug level monitoring: Secondary | ICD-10-CM | POA: Insufficient documentation

## 2023-08-18 DIAGNOSIS — I513 Intracardiac thrombosis, not elsewhere classified: Secondary | ICD-10-CM | POA: Diagnosis not present

## 2023-08-18 LAB — POCT INR: INR: 2.4 (ref 2.0–3.0)

## 2023-08-18 NOTE — Patient Instructions (Signed)
Continue warfarin 1 tablet daily except 1 1/2 tablets on Mondays, Wednesdays and Fridays Recheck in 6 weeks   

## 2023-09-05 DIAGNOSIS — S20211A Contusion of right front wall of thorax, initial encounter: Secondary | ICD-10-CM | POA: Diagnosis not present

## 2023-09-05 DIAGNOSIS — S301XXA Contusion of abdominal wall, initial encounter: Secondary | ICD-10-CM | POA: Diagnosis not present

## 2023-09-05 DIAGNOSIS — R079 Chest pain, unspecified: Secondary | ICD-10-CM | POA: Diagnosis not present

## 2023-09-16 DIAGNOSIS — R911 Solitary pulmonary nodule: Secondary | ICD-10-CM | POA: Diagnosis not present

## 2023-09-16 DIAGNOSIS — J209 Acute bronchitis, unspecified: Secondary | ICD-10-CM | POA: Diagnosis not present

## 2023-09-16 DIAGNOSIS — S2231XA Fracture of one rib, right side, initial encounter for closed fracture: Secondary | ICD-10-CM | POA: Diagnosis not present

## 2023-09-16 DIAGNOSIS — J069 Acute upper respiratory infection, unspecified: Secondary | ICD-10-CM | POA: Diagnosis not present

## 2023-09-29 ENCOUNTER — Ambulatory Visit: Payer: Medicare Other | Attending: Cardiology | Admitting: *Deleted

## 2023-09-29 DIAGNOSIS — Z5181 Encounter for therapeutic drug level monitoring: Secondary | ICD-10-CM | POA: Diagnosis not present

## 2023-09-29 DIAGNOSIS — I6350 Cerebral infarction due to unspecified occlusion or stenosis of unspecified cerebral artery: Secondary | ICD-10-CM | POA: Insufficient documentation

## 2023-09-29 DIAGNOSIS — I513 Intracardiac thrombosis, not elsewhere classified: Secondary | ICD-10-CM | POA: Insufficient documentation

## 2023-09-29 LAB — POCT INR: INR: 3 (ref 2.0–3.0)

## 2023-09-29 NOTE — Patient Instructions (Signed)
Continue warfarin 1 tablet daily except 1 1/2 tablets on Mondays, Wednesdays and Fridays Recheck in 6 weeks   

## 2023-10-06 DIAGNOSIS — H01002 Unspecified blepharitis right lower eyelid: Secondary | ICD-10-CM | POA: Diagnosis not present

## 2023-10-06 DIAGNOSIS — H40011 Open angle with borderline findings, low risk, right eye: Secondary | ICD-10-CM | POA: Diagnosis not present

## 2023-10-06 DIAGNOSIS — H01005 Unspecified blepharitis left lower eyelid: Secondary | ICD-10-CM | POA: Diagnosis not present

## 2023-10-06 DIAGNOSIS — Z961 Presence of intraocular lens: Secondary | ICD-10-CM | POA: Diagnosis not present

## 2023-10-06 DIAGNOSIS — H26491 Other secondary cataract, right eye: Secondary | ICD-10-CM | POA: Diagnosis not present

## 2023-10-06 DIAGNOSIS — H01001 Unspecified blepharitis right upper eyelid: Secondary | ICD-10-CM | POA: Diagnosis not present

## 2023-10-06 DIAGNOSIS — H01004 Unspecified blepharitis left upper eyelid: Secondary | ICD-10-CM | POA: Diagnosis not present

## 2023-10-06 DIAGNOSIS — H52223 Regular astigmatism, bilateral: Secondary | ICD-10-CM | POA: Diagnosis not present

## 2023-10-22 DIAGNOSIS — E1142 Type 2 diabetes mellitus with diabetic polyneuropathy: Secondary | ICD-10-CM | POA: Diagnosis not present

## 2023-10-22 DIAGNOSIS — B351 Tinea unguium: Secondary | ICD-10-CM | POA: Diagnosis not present

## 2023-10-28 DIAGNOSIS — E1122 Type 2 diabetes mellitus with diabetic chronic kidney disease: Secondary | ICD-10-CM | POA: Diagnosis not present

## 2023-10-28 DIAGNOSIS — E782 Mixed hyperlipidemia: Secondary | ICD-10-CM | POA: Diagnosis not present

## 2023-11-03 ENCOUNTER — Telehealth: Payer: Self-pay | Admitting: Cardiology

## 2023-11-03 ENCOUNTER — Other Ambulatory Visit (HOSPITAL_COMMUNITY): Payer: Self-pay

## 2023-11-03 ENCOUNTER — Telehealth: Payer: Self-pay

## 2023-11-03 ENCOUNTER — Telehealth: Payer: Self-pay | Admitting: Pharmacy Technician

## 2023-11-03 ENCOUNTER — Encounter: Payer: Self-pay | Admitting: Internal Medicine

## 2023-11-03 DIAGNOSIS — Z8673 Personal history of transient ischemic attack (TIA), and cerebral infarction without residual deficits: Secondary | ICD-10-CM | POA: Diagnosis not present

## 2023-11-03 DIAGNOSIS — E663 Overweight: Secondary | ICD-10-CM | POA: Diagnosis not present

## 2023-11-03 DIAGNOSIS — G4733 Obstructive sleep apnea (adult) (pediatric): Secondary | ICD-10-CM | POA: Diagnosis not present

## 2023-11-03 DIAGNOSIS — I69954 Hemiplegia and hemiparesis following unspecified cerebrovascular disease affecting left non-dominant side: Secondary | ICD-10-CM | POA: Diagnosis not present

## 2023-11-03 DIAGNOSIS — E782 Mixed hyperlipidemia: Secondary | ICD-10-CM | POA: Diagnosis not present

## 2023-11-03 DIAGNOSIS — I482 Chronic atrial fibrillation, unspecified: Secondary | ICD-10-CM | POA: Diagnosis not present

## 2023-11-03 DIAGNOSIS — I129 Hypertensive chronic kidney disease with stage 1 through stage 4 chronic kidney disease, or unspecified chronic kidney disease: Secondary | ICD-10-CM | POA: Diagnosis not present

## 2023-11-03 DIAGNOSIS — I5022 Chronic systolic (congestive) heart failure: Secondary | ICD-10-CM | POA: Diagnosis not present

## 2023-11-03 DIAGNOSIS — E1143 Type 2 diabetes mellitus with diabetic autonomic (poly)neuropathy: Secondary | ICD-10-CM | POA: Diagnosis not present

## 2023-11-03 DIAGNOSIS — E1122 Type 2 diabetes mellitus with diabetic chronic kidney disease: Secondary | ICD-10-CM | POA: Diagnosis not present

## 2023-11-03 DIAGNOSIS — N1831 Chronic kidney disease, stage 3a: Secondary | ICD-10-CM | POA: Diagnosis not present

## 2023-11-03 DIAGNOSIS — D509 Iron deficiency anemia, unspecified: Secondary | ICD-10-CM | POA: Diagnosis not present

## 2023-11-03 MED ORDER — SACUBITRIL-VALSARTAN 24-26 MG PO TABS: 1.0000 | ORAL_TABLET | Freq: Two times a day (BID) | ORAL | 3 refills | Status: AC

## 2023-11-03 NOTE — Telephone Encounter (Signed)
 Pt c/o medication issue:  1. Name of Medication: sacubitril-valsartan (ENTRESTO) 24-26 MG   2. How are you currently taking this medication (dosage and times per day)? As written  3. Are you having a reaction (difficulty breathing--STAT)? No  4. What is your medication issue? Dr. Margo Aye office calling in regards to pt needing Finical assistance for this medication.

## 2023-11-03 NOTE — Telephone Encounter (Signed)
 Patient Advocate Encounter   The patient was approved for a [employer] grant that will help cover the cost of ENTRESTO Total amount awarded, 10,000.00.  Effective: 10/04/23 - 10/02/24   [id number] [id number] [employer] ID: [id number]   Pharmacy provided with approval and processing information. Patient informed via TELEPHONE

## 2023-11-03 NOTE — Progress Notes (Signed)
   11/03/2023  Patient ID: Dennis Yu, male   DOB: September 07, 1945, 78 y.o.   MRN: [id number]   Medication Assistance  Met with Dennis Yu after his visit with Dr. Margo Aye, he is needing assistance with Rybelsus and Entresto. He told us he was getting Entresto assistance with cardiology (Dr. Diona Browner) for the last 2 years but says he's been out of this for the last month. He has been working with them to get this approved for 2025 but he hasn't followed up to confirm this has happened. We provided him with a 2 week sample and I contacted his cardiology office on his behalf. Awaiting a return call to see how we can help.  He's been on Rybelsus 3 mg samples, tolerating well, but it was too expensive at the pharmacy for him to continue the 7 mg dose increase. We provided him with more 3 mg samples and will apply for 7 mg with Novo. I counseled him on administration and potential side effects, including nausea when increasing to the 7 mg. Coordinated application completion with Lowella Dandy and [place] signed the forms at checkout today.   Fayette Pho, PharmD

## 2023-11-03 NOTE — Telephone Encounter (Signed)
 Prescription for [facility] sent to [facility].

## 2023-11-04 NOTE — Telephone Encounter (Signed)
 The wife called and the pharmacy did not apply the grant. I called [facility] and now 0.00. wife aware

## 2023-11-10 ENCOUNTER — Ambulatory Visit: Payer: Medicare Other | Attending: Cardiology | Admitting: *Deleted

## 2023-11-10 DIAGNOSIS — I513 Intracardiac thrombosis, not elsewhere classified: Secondary | ICD-10-CM

## 2023-11-10 DIAGNOSIS — I6350 Cerebral infarction due to unspecified occlusion or stenosis of unspecified cerebral artery: Secondary | ICD-10-CM | POA: Diagnosis not present

## 2023-11-10 DIAGNOSIS — Z5181 Encounter for therapeutic drug level monitoring: Secondary | ICD-10-CM | POA: Diagnosis not present

## 2023-11-10 LAB — POCT INR: INR: 2.3 (ref 2.0–3.0)

## 2023-11-10 NOTE — Patient Instructions (Signed)
Continue warfarin 1 tablet daily except 1 1/2 tablets on Mondays, Wednesdays and Fridays Recheck in 6 weeks   

## 2023-12-22 ENCOUNTER — Ambulatory Visit: Attending: Cardiology | Admitting: *Deleted

## 2023-12-22 DIAGNOSIS — I6350 Cerebral infarction due to unspecified occlusion or stenosis of unspecified cerebral artery: Secondary | ICD-10-CM | POA: Insufficient documentation

## 2023-12-22 DIAGNOSIS — I513 Intracardiac thrombosis, not elsewhere classified: Secondary | ICD-10-CM | POA: Insufficient documentation

## 2023-12-22 DIAGNOSIS — Z5181 Encounter for therapeutic drug level monitoring: Secondary | ICD-10-CM | POA: Insufficient documentation

## 2023-12-22 LAB — POCT INR: INR: 2.4 (ref 2.0–3.0)

## 2023-12-22 NOTE — Patient Instructions (Signed)
Continue warfarin 1 tablet daily except 1 1/2 tablets on Mondays, Wednesdays and Fridays Recheck in 6 weeks   

## 2024-01-05 ENCOUNTER — Other Ambulatory Visit: Payer: Self-pay | Admitting: Cardiology

## 2024-01-06 DIAGNOSIS — E1142 Type 2 diabetes mellitus with diabetic polyneuropathy: Secondary | ICD-10-CM | POA: Diagnosis not present

## 2024-01-06 DIAGNOSIS — B351 Tinea unguium: Secondary | ICD-10-CM | POA: Diagnosis not present

## 2024-01-20 ENCOUNTER — Encounter: Payer: Self-pay | Admitting: Cardiology

## 2024-01-20 ENCOUNTER — Ambulatory Visit: Payer: Medicare Other | Attending: Cardiology | Admitting: Cardiology

## 2024-01-20 VITALS — BP 110/72 | HR 75 | Ht 72.0 in | Wt 198.0 lb

## 2024-01-20 DIAGNOSIS — I5020 Unspecified systolic (congestive) heart failure: Secondary | ICD-10-CM | POA: Insufficient documentation

## 2024-01-20 DIAGNOSIS — I513 Intracardiac thrombosis, not elsewhere classified: Secondary | ICD-10-CM | POA: Diagnosis not present

## 2024-01-20 DIAGNOSIS — N1831 Chronic kidney disease, stage 3a: Secondary | ICD-10-CM | POA: Diagnosis not present

## 2024-01-20 NOTE — Progress Notes (Signed)
 Cardiology Office Note  Date: 01/20/2024   ID: Dennis Yu, DOB April 19, 1946, MRN [id number]  History of Present Illness: Dennis Yu is a 78 y.o. male last seen in December 2024.  He is here for a routine visit.  Reports no angina, no palpitations or syncope, stable NYHA class II dyspnea.  He has not had any progressive trouble with fluid retention.  Does have lower leg edema intermittently and takes an extra half Lasix  dose during those times.  His weight has been stable.  We went over his medications.  Doing well on current cardiac regimen.  He had a follow-up echocardiogram in December 2024 with LVEF 30 to 35%.  I reviewed his interval lab work from Dr. Quentin Brunner office.  I reviewed his ECG today which shows sinus rhythm with PVC, old inferior and anterolateral infarct pattern.  Physical Exam: VS:  BP 110/72 (BP Location: Left Arm, Cuff Size: Normal)   Pulse 75   Ht 6' (1.829 m)   Wt 198 lb (89.8 kg)   SpO2 94%   BMI 26.85 kg/m , BMI Body mass index is 26.85 kg/m.  Wt Readings from Last 3 Encounters:  01/20/24 198 lb (89.8 kg)  07/15/23 198 lb 3.2 oz (89.9 kg)  03/20/23 200 lb 13.4 oz (91.1 kg)    General: Patient appears comfortable at rest. HEENT: Conjunctiva and lids normal. Neck: Supple, no elevated JVP or carotid bruits. Lungs: Clear to auscultation, nonlabored breathing at rest. Cardiac: Regular rate and rhythm, no S3, 2/6 systolic murmur. Extremities: No pitting edema.  ECG:  An ECG dated 01/10/2023 was personally reviewed today and demonstrated:  Sinus bradycardia with old anterior infarct pattern and nonspecific ST-T changes.  Labwork: 03/13/2023: ALT 25; AST 23; BUN 23; Creatinine, Ser 1.35; Hemoglobin 14.5; Platelets 189; Potassium 4.3; Sodium 139  March 2025: Hemoglobin 13.7, platelets 180, BUN 22, creatinine 1.39, GFR 53, potassium 4.2, AST 35, ALT 29, cholesterol 121, triglycerides 95, HDL 28, LDL 75, hemoglobin A1c 7.8%  Other Studies Reviewed  Today:  Echocardiogram 07/24/2023:  1. Left ventricular ejection fraction, by estimation, is 30 to 35%. The  left ventricle has moderately decreased function. The left ventricle  demonstrates regional wall motion abnormalities (see scoring  diagram/findings for description). The left  ventricular internal cavity size was mildly dilated. Left ventricular  diastolic parameters are consistent with Grade I diastolic dysfunction  (impaired relaxation).   2. Right ventricular systolic function is normal. The right ventricular  size is normal. Tricuspid regurgitation signal is inadequate for assessing  PA pressure.   3. Left atrial size was severely dilated.   4. Right atrial size was mildly dilated.   5. The mitral valve is normal in structure. Trivial mitral valve  regurgitation. No evidence of mitral stenosis.   6. The aortic valve is tricuspid. Aortic valve regurgitation is not  visualized. No aortic stenosis is present.   7. Aortic dilatation noted. There is borderline dilatation of the aortic  root, measuring 38 mm.   8. The inferior vena cava is normal in size with greater than 50%  respiratory variability, suggesting right atrial pressure of 3 mmHg.   Assessment and Plan:  1.  HFrEF with ischemic cardiomyopathy, LVEF 30 to 35% by echocardiogram in December 2024.  He has declined ICD and prefers conservative medical therapy.  Remains stable with NYHA class II dyspnea and no progressive fluid retention.  Continue medical therapy including Coreg  6.25 mg twice daily, Farxiga 10 mg daily, Entresto  24/26 mg  twice daily, and Lasix  40 mg daily with an additional 20 mg dose for intermittent fluid retention.  Have held off MRA given fluctuating renal insufficiency.   2.  History of stroke and calcified apical LV mural thrombus.  He continues on Coumadin  with follow-up in anticoagulation clinic.  No reported spontaneous bleeding problems.   3.  CKD stage IIIa, creatinine 1.39 with GFR 53 in  March.   4.  Mixed hyperlipidemia, LDL 75 in March.  Continue Lipitor 40 mg daily.  Disposition:  Follow up 6 months.  Signed, Gerard Knight, M.D., F.A.C.C. [facility] at [facility]

## 2024-01-20 NOTE — Patient Instructions (Signed)
 Medication Instructions:  Your physician recommends that you continue on your current medications as directed. Please refer to the Current Medication list given to you today.  *If you need a refill on your cardiac medications before your next appointment, please call your pharmacy*  Lab Work: NONE   If you have labs (blood work) drawn today and your tests are completely normal, you will receive your results only by: MyChart Message (if you have MyChart) OR A paper copy in the mail If you have any lab test that is abnormal or we need to change your treatment, we will call you to review the results.  Testing/Procedures: NONE   Follow-Up: At [facility], you and your health needs are our priority.  As part of our continuing mission to provide you with exceptional heart care, our providers are all part of one team.  This team includes your primary Cardiologist (physician) and Advanced Practice Providers or APPs (Physician Assistants and Nurse Practitioners) who all work together to provide you with the care you need, when you need it.  Your next appointment:   6 month(s)  Provider:   You may see Nona Dell, MD or one of the following Advanced Practice Providers on your designated Care Team:   Randall An, PA-C  [place], [place] Jacolyn Reedy, [place]     We recommend signing up for the patient portal called "MyChart".  Sign up information is provided on this After Visit Summary.  MyChart is used to connect with patients for Virtual Visits (Telemedicine).  Patients are able to view lab/test results, encounter notes, upcoming appointments, etc.  Non-urgent messages can be sent to your provider as well.   To learn more about what you can do with MyChart, go to [email or web address].   Other Instructions Thank you for choosing [facility]!

## 2024-02-02 ENCOUNTER — Ambulatory Visit

## 2024-02-03 ENCOUNTER — Ambulatory Visit: Attending: Cardiology | Admitting: *Deleted

## 2024-02-03 DIAGNOSIS — I6350 Cerebral infarction due to unspecified occlusion or stenosis of unspecified cerebral artery: Secondary | ICD-10-CM | POA: Insufficient documentation

## 2024-02-03 DIAGNOSIS — I513 Intracardiac thrombosis, not elsewhere classified: Secondary | ICD-10-CM | POA: Diagnosis not present

## 2024-02-03 DIAGNOSIS — Z5181 Encounter for therapeutic drug level monitoring: Secondary | ICD-10-CM | POA: Insufficient documentation

## 2024-02-03 LAB — POCT INR: INR: 2.4 (ref 2.0–3.0)

## 2024-02-03 NOTE — Progress Notes (Signed)
Please see anticoagulation encounter.

## 2024-02-03 NOTE — Patient Instructions (Signed)
Continue warfarin 1 tablet daily except 1 1/2 tablets on Mondays, Wednesdays and Fridays Recheck in 6 weeks   

## 2024-02-16 DIAGNOSIS — E782 Mixed hyperlipidemia: Secondary | ICD-10-CM | POA: Diagnosis not present

## 2024-02-16 DIAGNOSIS — E1122 Type 2 diabetes mellitus with diabetic chronic kidney disease: Secondary | ICD-10-CM | POA: Diagnosis not present

## 2024-02-19 ENCOUNTER — Other Ambulatory Visit: Payer: Self-pay | Admitting: Cardiology

## 2024-02-20 ENCOUNTER — Encounter: Payer: Self-pay | Admitting: Internal Medicine

## 2024-02-20 DIAGNOSIS — I5022 Chronic systolic (congestive) heart failure: Secondary | ICD-10-CM | POA: Diagnosis not present

## 2024-02-20 DIAGNOSIS — E663 Overweight: Secondary | ICD-10-CM | POA: Diagnosis not present

## 2024-02-20 DIAGNOSIS — E1165 Type 2 diabetes mellitus with hyperglycemia: Secondary | ICD-10-CM | POA: Diagnosis not present

## 2024-02-20 DIAGNOSIS — G4733 Obstructive sleep apnea (adult) (pediatric): Secondary | ICD-10-CM | POA: Diagnosis not present

## 2024-02-20 DIAGNOSIS — N1831 Chronic kidney disease, stage 3a: Secondary | ICD-10-CM | POA: Diagnosis not present

## 2024-02-20 DIAGNOSIS — D509 Iron deficiency anemia, unspecified: Secondary | ICD-10-CM | POA: Diagnosis not present

## 2024-02-20 DIAGNOSIS — E1122 Type 2 diabetes mellitus with diabetic chronic kidney disease: Secondary | ICD-10-CM | POA: Diagnosis not present

## 2024-02-20 DIAGNOSIS — I129 Hypertensive chronic kidney disease with stage 1 through stage 4 chronic kidney disease, or unspecified chronic kidney disease: Secondary | ICD-10-CM | POA: Diagnosis not present

## 2024-02-20 DIAGNOSIS — I69959 Hemiplegia and hemiparesis following unspecified cerebrovascular disease affecting unspecified side: Secondary | ICD-10-CM | POA: Diagnosis not present

## 2024-02-20 DIAGNOSIS — I482 Chronic atrial fibrillation, unspecified: Secondary | ICD-10-CM | POA: Diagnosis not present

## 2024-02-20 DIAGNOSIS — E782 Mixed hyperlipidemia: Secondary | ICD-10-CM | POA: Diagnosis not present

## 2024-02-20 DIAGNOSIS — G9009 Other idiopathic peripheral autonomic neuropathy: Secondary | ICD-10-CM | POA: Diagnosis not present

## 2024-03-11 DIAGNOSIS — E559 Vitamin D deficiency, unspecified: Secondary | ICD-10-CM | POA: Diagnosis not present

## 2024-03-11 DIAGNOSIS — N1831 Chronic kidney disease, stage 3a: Secondary | ICD-10-CM | POA: Diagnosis not present

## 2024-03-11 DIAGNOSIS — E1122 Type 2 diabetes mellitus with diabetic chronic kidney disease: Secondary | ICD-10-CM | POA: Diagnosis not present

## 2024-03-11 DIAGNOSIS — I5042 Chronic combined systolic (congestive) and diastolic (congestive) heart failure: Secondary | ICD-10-CM | POA: Diagnosis not present

## 2024-03-16 ENCOUNTER — Ambulatory Visit: Attending: Cardiology | Admitting: *Deleted

## 2024-03-16 DIAGNOSIS — Z5181 Encounter for therapeutic drug level monitoring: Secondary | ICD-10-CM | POA: Diagnosis not present

## 2024-03-16 DIAGNOSIS — E1142 Type 2 diabetes mellitus with diabetic polyneuropathy: Secondary | ICD-10-CM | POA: Diagnosis not present

## 2024-03-16 DIAGNOSIS — B351 Tinea unguium: Secondary | ICD-10-CM | POA: Diagnosis not present

## 2024-03-16 DIAGNOSIS — I513 Intracardiac thrombosis, not elsewhere classified: Secondary | ICD-10-CM | POA: Diagnosis not present

## 2024-03-16 DIAGNOSIS — I6350 Cerebral infarction due to unspecified occlusion or stenosis of unspecified cerebral artery: Secondary | ICD-10-CM | POA: Insufficient documentation

## 2024-03-16 LAB — POCT INR: INR: 2.6 (ref 2.0–3.0)

## 2024-03-16 NOTE — Progress Notes (Signed)
 INR 2.6; Please see anticoagulation encounter

## 2024-03-16 NOTE — Patient Instructions (Signed)
Continue warfarin 1 tablet daily except 1 1/2 tablets on Mondays, Wednesdays and Fridays Recheck in 6 weeks   

## 2024-03-17 ENCOUNTER — Other Ambulatory Visit: Payer: Self-pay

## 2024-03-17 DIAGNOSIS — D509 Iron deficiency anemia, unspecified: Secondary | ICD-10-CM

## 2024-03-17 DIAGNOSIS — D631 Anemia in chronic kidney disease: Secondary | ICD-10-CM

## 2024-03-17 NOTE — Progress Notes (Signed)
 Lab orders put in.

## 2024-03-18 ENCOUNTER — Inpatient Hospital Stay: Payer: Medicare Other | Attending: Oncology

## 2024-03-18 DIAGNOSIS — D509 Iron deficiency anemia, unspecified: Secondary | ICD-10-CM

## 2024-03-18 DIAGNOSIS — Z7901 Long term (current) use of anticoagulants: Secondary | ICD-10-CM | POA: Insufficient documentation

## 2024-03-18 DIAGNOSIS — D631 Anemia in chronic kidney disease: Secondary | ICD-10-CM | POA: Diagnosis not present

## 2024-03-18 DIAGNOSIS — Z86718 Personal history of other venous thrombosis and embolism: Secondary | ICD-10-CM | POA: Insufficient documentation

## 2024-03-18 DIAGNOSIS — N1831 Chronic kidney disease, stage 3a: Secondary | ICD-10-CM | POA: Diagnosis not present

## 2024-03-18 DIAGNOSIS — D508 Other iron deficiency anemias: Secondary | ICD-10-CM | POA: Insufficient documentation

## 2024-03-18 LAB — COMPREHENSIVE METABOLIC PANEL WITH GFR
ALT: 31 U/L (ref 0–44)
AST: 30 U/L (ref 15–41)
Albumin: 4 g/dL (ref 3.5–5.0)
Alkaline Phosphatase: 63 U/L (ref 38–126)
Anion gap: 7 (ref 5–15)
BUN: 25 mg/dL — ABNORMAL HIGH (ref 8–23)
CO2: 26 mmol/L (ref 22–32)
Calcium: 8.9 mg/dL (ref 8.9–10.3)
Chloride: 105 mmol/L (ref 98–111)
Creatinine, Ser: 1.55 mg/dL — ABNORMAL HIGH (ref 0.61–1.24)
GFR, Estimated: 46 mL/min — ABNORMAL LOW (ref >60)
Glucose, Bld: 126 mg/dL — ABNORMAL HIGH (ref 70–99)
Potassium: 4.5 mmol/L (ref 3.5–5.1)
Sodium: 138 mmol/L (ref 135–145)
Total Bilirubin: 1 mg/dL (ref 0.0–1.2)
Total Protein: 7.2 g/dL (ref 6.5–8.1)

## 2024-03-18 LAB — CBC WITH DIFFERENTIAL/PLATELET
Abs Immature Granulocytes: 0.01 K/uL (ref 0.00–0.07)
Basophils Absolute: 0.1 K/uL (ref 0.0–0.1)
Basophils Relative: 1 %
Eosinophils Absolute: 0.2 K/uL (ref 0.0–0.5)
Eosinophils Relative: 3 %
HCT: 46.3 % (ref 39.0–52.0)
Hemoglobin: 14.4 g/dL (ref 13.0–17.0)
Immature Granulocytes: 0 %
Lymphocytes Relative: 23 %
Lymphs Abs: 1.6 K/uL (ref 0.7–4.0)
MCH: 28.2 pg (ref 26.0–34.0)
MCHC: 31.1 g/dL (ref 30.0–36.0)
MCV: 90.6 fL (ref 80.0–100.0)
Monocytes Absolute: 0.6 K/uL (ref 0.1–1.0)
Monocytes Relative: 8 %
Neutro Abs: 4.7 K/uL (ref 1.7–7.7)
Neutrophils Relative %: 65 %
Platelets: 178 K/uL (ref 150–400)
RBC: 5.11 MIL/uL (ref 4.22–5.81)
RDW: 13.2 % (ref 11.5–15.5)
WBC: 7.1 K/uL (ref 4.0–10.5)
nRBC: 0 % (ref 0.0–0.2)

## 2024-03-18 LAB — IRON AND TIBC
Iron: 72 ug/dL (ref 45–182)
Saturation Ratios: 22 % (ref 17.9–39.5)
TIBC: 330 ug/dL (ref 250–450)
UIBC: 258 ug/dL

## 2024-03-18 LAB — FERRITIN: Ferritin: 140 ng/mL (ref 24–336)

## 2024-03-24 DIAGNOSIS — N189 Chronic kidney disease, unspecified: Secondary | ICD-10-CM | POA: Insufficient documentation

## 2024-03-24 NOTE — Progress Notes (Unsigned)
   [facility] OFFICE PROGRESS NOTE  Shona Norleen PEDLAR, MD  ASSESSMENT & PLAN:  Assessment & Plan Iron deficiency anemia secondary to inadequate dietary iron intake - Normocytic anemia secondary to iron deficiency and anemia of CKD. - Colonoscopy on 01/11/2020 with diverticulosis in the sigmoid colon and the descending colon.  5 mm polyp in the ascending colon resected.  Nonbleeding internal hemorrhoids. - SPEP & UPEP checked by nephrologist was negative for monoclonal immunoglobulin or free light chain - No bright red blood per rectum or melena  - Not currently taking oral iron.  Unclear of when he stopped.  - Most recent labs from 03/13/2023 show hemoglobin of 14.5, MCV 91.5, ferritin 179 with iron saturations 24%. - PLAN:  No indications for IV iron at this time.  Continue to hold iron tabs at this time..  Return to clinic in 12 months for repeat labs and phone visit. Chronic kidney disease, unspecified CKD stage - Patient is established with nephrologist, baseline creatinine appears to be 1.3-1.8 -Creatinine 1.35 today and baseline. - PLAN: Continue follow-up with nephrology (Dr. Rachele)    No orders of the defined types were placed in this encounter.   INTERVAL HISTORY: Patient returns for recurrent anemia.   Symptoms of anemia includes {Symptoms; anemia:12350} We reviewed ***  SUMMARY OF HEMATOLOGIC HISTORY: ***  No results found for: CBC  There were no vitals filed for this visit.  I spent *** minutes dedicated to the care of this patient (face-to-face and non-face-to-face) on the date of the encounter to include what is described in the assessment and plan.,  Delon Hope, NP 03/24/2024 8:55 PM

## 2024-03-24 NOTE — Assessment & Plan Note (Addendum)
-   Normocytic anemia secondary to iron deficiency and anemia of CKD. - Colonoscopy on 01/11/2020 with diverticulosis in the sigmoid colon and the descending colon.  5 mm polyp in the ascending colon resected.  Nonbleeding internal hemorrhoids. - SPEP & UPEP checked by nephrologist was negative for monoclonal immunoglobulin or free light chain - No bright red blood per rectum or melena  - Not currently taking oral iron.  Unclear of when he stopped.  - Most recent labs from 03/13/2023 show hemoglobin of 14.5, MCV 91.5, ferritin 179 with iron saturations 24%. - PLAN:  No indications for IV iron at this time.  Continue to hold iron tabs at this time..  Return to clinic in 12 months for repeat labs and phone visit.

## 2024-03-24 NOTE — Assessment & Plan Note (Addendum)
-   Patient is established with nephrologist, baseline creatinine appears to be 1.3-1.8 -Creatinine 1.55 today and baseline. - Continue follow-up with nephrology (Dr. Rachele)

## 2024-03-25 ENCOUNTER — Inpatient Hospital Stay (HOSPITAL_BASED_OUTPATIENT_CLINIC_OR_DEPARTMENT_OTHER): Payer: Medicare Other | Admitting: Oncology

## 2024-03-25 VITALS — BP 122/87 | HR 60 | Temp 98.2°F | Resp 18 | Ht 72.0 in | Wt 196.4 lb

## 2024-03-25 DIAGNOSIS — N189 Chronic kidney disease, unspecified: Secondary | ICD-10-CM

## 2024-03-25 DIAGNOSIS — D508 Other iron deficiency anemias: Secondary | ICD-10-CM

## 2024-03-25 DIAGNOSIS — N1831 Chronic kidney disease, stage 3a: Secondary | ICD-10-CM | POA: Diagnosis not present

## 2024-03-25 DIAGNOSIS — Z7901 Long term (current) use of anticoagulants: Secondary | ICD-10-CM | POA: Diagnosis not present

## 2024-03-25 DIAGNOSIS — D631 Anemia in chronic kidney disease: Secondary | ICD-10-CM | POA: Diagnosis not present

## 2024-03-25 DIAGNOSIS — I809 Phlebitis and thrombophlebitis of unspecified site: Secondary | ICD-10-CM | POA: Diagnosis not present

## 2024-03-25 DIAGNOSIS — Z86718 Personal history of other venous thrombosis and embolism: Secondary | ICD-10-CM | POA: Diagnosis not present

## 2024-03-25 NOTE — Assessment & Plan Note (Signed)
 Continue Coumadin . Denies any bleeding issues.

## 2024-04-12 ENCOUNTER — Other Ambulatory Visit: Payer: Self-pay

## 2024-04-12 MED ORDER — WARFARIN SODIUM 5 MG PO TABS
ORAL_TABLET | ORAL | 3 refills | Status: AC
Start: 2024-04-12 — End: ?

## 2024-04-12 NOTE — Telephone Encounter (Signed)
 Refill request

## 2024-04-27 ENCOUNTER — Ambulatory Visit: Attending: Cardiology | Admitting: *Deleted

## 2024-04-27 DIAGNOSIS — I513 Intracardiac thrombosis, not elsewhere classified: Secondary | ICD-10-CM | POA: Diagnosis not present

## 2024-04-27 DIAGNOSIS — Z5181 Encounter for therapeutic drug level monitoring: Secondary | ICD-10-CM | POA: Insufficient documentation

## 2024-04-27 DIAGNOSIS — I6350 Cerebral infarction due to unspecified occlusion or stenosis of unspecified cerebral artery: Secondary | ICD-10-CM | POA: Diagnosis not present

## 2024-04-27 LAB — POCT INR: INR: 2.1 (ref 2.0–3.0)

## 2024-04-27 NOTE — Patient Instructions (Signed)
Continue warfarin 1 tablet daily except 1 1/2 tablets on Mondays, Wednesdays and Fridays Recheck in 6 weeks   

## 2024-04-27 NOTE — Progress Notes (Signed)
 INR 2.1; Please see anticoagulation encounter

## 2024-05-10 ENCOUNTER — Other Ambulatory Visit: Payer: Self-pay | Admitting: Gastroenterology

## 2024-06-08 ENCOUNTER — Ambulatory Visit: Attending: Cardiology | Admitting: *Deleted

## 2024-06-08 DIAGNOSIS — Z5181 Encounter for therapeutic drug level monitoring: Secondary | ICD-10-CM | POA: Diagnosis not present

## 2024-06-08 DIAGNOSIS — I513 Intracardiac thrombosis, not elsewhere classified: Secondary | ICD-10-CM | POA: Diagnosis not present

## 2024-06-08 DIAGNOSIS — I6350 Cerebral infarction due to unspecified occlusion or stenosis of unspecified cerebral artery: Secondary | ICD-10-CM | POA: Diagnosis not present

## 2024-06-08 LAB — POCT INR: INR: 1.9 — AB (ref 2.0–3.0)

## 2024-06-08 NOTE — Progress Notes (Signed)
 INR 1.9; Please see anticoagulation encounter

## 2024-06-08 NOTE — Patient Instructions (Signed)
 Take warfarin 1 1/2 tablets tonight then resume 1 tablet daily except 1 1/2 tablets on Mondays, Wednesdays and Fridays  Recheck in 6 weeks

## 2024-06-10 DIAGNOSIS — E1142 Type 2 diabetes mellitus with diabetic polyneuropathy: Secondary | ICD-10-CM | POA: Diagnosis not present

## 2024-06-10 DIAGNOSIS — B351 Tinea unguium: Secondary | ICD-10-CM | POA: Diagnosis not present

## 2024-06-21 DIAGNOSIS — E782 Mixed hyperlipidemia: Secondary | ICD-10-CM | POA: Diagnosis not present

## 2024-06-21 DIAGNOSIS — E1122 Type 2 diabetes mellitus with diabetic chronic kidney disease: Secondary | ICD-10-CM | POA: Diagnosis not present

## 2024-06-25 ENCOUNTER — Encounter: Payer: Self-pay | Admitting: Internal Medicine

## 2024-06-25 DIAGNOSIS — I130 Hypertensive heart and chronic kidney disease with heart failure and stage 1 through stage 4 chronic kidney disease, or unspecified chronic kidney disease: Secondary | ICD-10-CM | POA: Diagnosis not present

## 2024-06-25 DIAGNOSIS — N1831 Chronic kidney disease, stage 3a: Secondary | ICD-10-CM | POA: Diagnosis not present

## 2024-06-25 DIAGNOSIS — G4733 Obstructive sleep apnea (adult) (pediatric): Secondary | ICD-10-CM | POA: Diagnosis not present

## 2024-06-25 DIAGNOSIS — G9009 Other idiopathic peripheral autonomic neuropathy: Secondary | ICD-10-CM | POA: Diagnosis not present

## 2024-06-25 DIAGNOSIS — E782 Mixed hyperlipidemia: Secondary | ICD-10-CM | POA: Diagnosis not present

## 2024-06-25 DIAGNOSIS — D509 Iron deficiency anemia, unspecified: Secondary | ICD-10-CM | POA: Diagnosis not present

## 2024-06-25 DIAGNOSIS — Z0001 Encounter for general adult medical examination with abnormal findings: Secondary | ICD-10-CM | POA: Diagnosis not present

## 2024-06-25 DIAGNOSIS — I482 Chronic atrial fibrillation, unspecified: Secondary | ICD-10-CM | POA: Diagnosis not present

## 2024-06-25 DIAGNOSIS — Z0000 Encounter for general adult medical examination without abnormal findings: Secondary | ICD-10-CM | POA: Diagnosis not present

## 2024-06-25 DIAGNOSIS — I5022 Chronic systolic (congestive) heart failure: Secondary | ICD-10-CM | POA: Diagnosis not present

## 2024-06-25 DIAGNOSIS — Z23 Encounter for immunization: Secondary | ICD-10-CM | POA: Diagnosis not present

## 2024-06-25 DIAGNOSIS — E1122 Type 2 diabetes mellitus with diabetic chronic kidney disease: Secondary | ICD-10-CM | POA: Diagnosis not present

## 2024-07-02 ENCOUNTER — Other Ambulatory Visit: Payer: Self-pay | Admitting: Cardiology

## 2024-07-07 NOTE — Progress Notes (Unsigned)
 GI Office Note    Referring Provider: Shona Norleen PEDLAR, MD Primary Care Physician:  Shona Norleen PEDLAR, MD Primary Gastroenterologist: Lamar HERO.Rourk, MD  Date:  07/08/2024  ID:  Dennis Yu, DOB 12/28/1945, MRN [id number]   Chief Complaint   Chief Complaint  Patient presents with   Follow-up    Follow up. No problems    History of Present Illness  Dennis Yu is a 78 y.o. male with a history of IDA, GERD, suspected perforated duodenal ulcer in June 2021, diverticulosis, colon polyps, diabetes, stroke, MI and cardiomyopathy, HTN, HLD, and OSA presenting today for follow-up and need for medication refills without any complaints.  Colonoscopy June 2021: - Diverticulosis in the sigmoid colon and in the descending colon.  - One 5 mm polyp in the ascending colon, removed with a cold snare.  - Status post segmental biopsy.  - Non- bleeding internal hemorrhoids.  - The examination was otherwise normal on direct and retroflexion views. - No repeat colonoscopy recommended due to age.   Last office visit May 2024 with Josette Centers, PA-C. Had labs the month prior with normal hgb. Denied any overt GI bleeding and continued to follow with hematology. Previously declined follow up EGD to assess for healing of perforated ulcer. He requested no further invasive procedures unless he had new symptoms. Advised to continue pantoprazole  40 mg once daily and keep following with hematology for IDA. Follow up in 1 year, sooner if needed.      Latest Ref Rng & Units 03/18/2024    7:40 AM 03/13/2023    8:15 AM 03/21/2022    8:37 AM  CBC  WBC 4.0 - 10.5 K/uL 7.1  6.5  5.9   Hemoglobin 13.0 - 17.0 g/dL 85.5  85.4  86.0   Hematocrit 39.0 - 52.0 % 46.3  47.4  45.4   Platelets 150 - 400 K/uL 178  189  175    Seen by hematology in August, they discussed discharge from the clinic but patient preferred to see them annually.  No need for IV iron at the time, stable iron levels and currently not anemic.   Denied any melena or BRBPR.  They advised continued follow-up with nephrology.  Today:  Discussed the use of AI scribe software for clinical note transcription with the patient, who gave verbal consent to proceed.  He has a history of iron deficiency anemia and regularly follows up with hematology. He has no recent symptoms of gastrointestinal bleeding, such as blood in stool or melena, and no gastrointestinal discomfort like constipation, diarrhea, or abdominal pain. His appetite remains good, and he has not experienced significant weight loss, only a couple of pounds. His last lab work in August 2025 showed a normal hemoglobin level of 14.4 and stable iron levels without the need for oral iron supplementation.  He has a history of a perforated ulcer identified on EGD in 2021 and currently takes pantoprazole  once daily. He reports no breakthrough symptoms of acid reflux. He previously took pantoprazole  twice daily but has reduced it to once daily.  He experiences shortness of breath, which has been a long-standing issue. He uses a cane due to weakness following a stroke and a past heart attack, although he does not recall the heart attack. No recent falls.  He is currently taking warfarin and does not report any issues with this medication. He receives his pantoprazole  through mail order and recently received a three-month supply.      Wt Readings  from Last 6 Encounters:  07/08/24 197 lb 3.2 oz (89.4 kg)  03/25/24 196 lb 6.9 oz (89.1 kg)  01/20/24 198 lb (89.8 kg)  07/15/23 198 lb 3.2 oz (89.9 kg)  03/20/23 200 lb 13.4 oz (91.1 kg)  03/07/23 197 lb 15.6 oz (89.8 kg)    Body mass index is 26.75 kg/m.   Current Outpatient Medications  Medication Sig Dispense Refill   aspirin  81 MG tablet Take 81 mg by mouth daily.     atorvastatin (LIPITOR) 40 MG tablet Take 40 mg by mouth daily.     carvedilol  (COREG ) 6.25 MG tablet TAKE 1 TABLET TWICE DAILY  WITH MEALS 180 tablet 3   FARXIGA 10 MG  TABS tablet Take 10 mg by mouth daily.     furosemide  (LASIX ) 40 MG tablet TAKE 1 AND 1/2 TABLETS DAILY 135 tablet 3   gabapentin  (NEURONTIN ) 300 MG capsule Take 3 capsules (900 mg total) by mouth daily. 90 capsule 11   glipiZIDE (GLUCOTROL) 5 MG tablet TAKE 1 TABLET AT DINNER FORSUGARS (STOP METFORMIN).     NON FORMULARY legatrin pm Daily     Omega-3 Fatty Acids (FISH OIL ) 1000 MG CAPS Take 1,000 mg by mouth daily.     PROAIR HFA 108 (90 BASE) MCG/ACT inhaler Inhale 2 puffs into the lungs every 4 (four) hours as needed for wheezing or shortness of breath.      sacubitril -valsartan  (ENTRESTO ) 24-26 MG Take 1 tablet by mouth 2 (two) times daily. 180 tablet 3   Semaglutide (RYBELSUS) 7 MG TABS Take 1 tablet every day by oral route.     warfarin (JANTOVEN ) 5 MG tablet TAKE 1 TABLET DAILY EXCEPT  1 AND 1/2 TABLETS         MONDAY, WEDNESDAY,    AND  FRIDAY OR AS DIRECTED 109 tablet 3   pantoprazole  (PROTONIX ) 40 MG tablet Take 1 tablet (40 mg total) by mouth daily. 90 tablet 3   No current facility-administered medications for this visit.    Past Medical History:  Diagnosis Date   Bronchitis 11/2012   Cardiomyopathy    LVEF 25%   Dyspnea    Erectile dysfunction    Essential hypertension    GERD (gastroesophageal reflux disease)    History of colonic polyps    Mixed hyperlipidemia    Mural thrombus of left ventricle    Myocardial infarction [facility])    Obstructive sleep apnea    Patent foramen ovale    Perforated duodenal ulcer (HCC) 01/2020   recommended repeat EGD 6-8 week, but patient declined.   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   Superficial phlebitis 12/10/2012   Type 2 diabetes mellitus (HCC)    Vitamin D deficiency     Past Surgical History:  Procedure Laterality Date   BIOPSY  01/11/2020   Procedure: BIOPSY;  Surgeon: Shaaron Lamar HERO, MD;  Location: [facility] ENDO SUITE;  Service: Endoscopy;;   CATARACT EXTRACTION W/PHACO Left 02/24/2023    Procedure: CATARACT EXTRACTION PHACO AND INTRAOCULAR LENS PLACEMENT (IOC) with Placement of Corticosteriod;  Surgeon: Harrie Agent, MD;  Location: [facility] ORS;  Service: Ophthalmology;  Laterality: Left;  CDE: 8.21   CATARACT EXTRACTION W/PHACO Right 03/07/2023   Procedure: CATARACT EXTRACTION PHACO AND INTRAOCULAR LENS PLACEMENT WITH PLACEMENT OF CORTICOSTEROID;  Surgeon: Harrie Agent, MD;  Location: [facility] ORS;  Service: Ophthalmology;  Laterality: Right;  CDE 7.06   COLONOSCOPY WITH PROPOFOL  N/A 01/11/2020   Surgeon: Shaaron,  Lamar HERO, MD; diverticular disease, single sessile serrated polyp without cytologic dysplasia and negative colon biopsies.  Recommended 5 year repeat if health permits. *Patient requesting no future colonoscopy.   POLYPECTOMY  01/11/2020   Procedure: POLYPECTOMY;  Surgeon: Shaaron Lamar HERO, MD;  Location: [facility];  Service: Endoscopy;;    Family History  Problem Relation Age of Onset   Diabetes Father    Hypertension Other    Colon cancer Neg Hx     Allergies as of 07/08/2024   (No Known Allergies)    Social History   Socioeconomic History   Marital status: Married    Spouse name: Not on file   Number of children: Not on file   Years of education: Not on file   Highest education level: Not on file  Occupational History   Occupation: Truck driver    Employer: RETIRED  Tobacco Use   Smoking status: Former    Current packs/day: 0.00    Types: Cigarettes    Start date: 08/06/1967    Quit date: 08/06/2003    Years since quitting: 20.9   Smokeless tobacco: Never  Vaping Use   Vaping status: Never Used  Substance and Sexual Activity   Alcohol use: No    Alcohol/week: 0.0 standard drinks of alcohol   Drug use: No   Sexual activity: Not Currently  Other Topics Concern   Not on file  Social History Narrative   Not on file   Social Drivers of Health   Financial Resource Strain: Not on file  Food Insecurity: Not on file  Transportation Needs: Not on file   Physical Activity: Not on file  Stress: Not on file  Social Connections: Not on file    Review of Systems   Gen: + MSK weakness. Denies fever, chills, anorexia. Denies fatigue, weight loss.  CV: Denies chest pain, palpitations, syncope, peripheral edema, and claudication. Resp: + dyspnea on exertion. Denies cough, wheezing, coughing up blood, and pleurisy. GI: See HPI Derm: Denies rash, itching, dry skin Psych: Denies depression, anxiety, memory loss, confusion. No homicidal or suicidal ideation.  Heme: Denies bruising, bleeding, and enlarged lymph nodes.  Physical Exam   BP 117/76 (BP Location: Right Arm, Patient Position: Sitting, Cuff Size: Large)   Pulse 61   Temp 97.6 F (36.4 C) (Temporal)   Ht 6' (1.829 m)   Wt 197 lb 3.2 oz (89.4 kg)   BMI 26.75 kg/m   General:   Alert and oriented. No distress noted. Pleasant and cooperative.  Head:  Normocephalic and atraumatic. Eyes:  Conjuctiva clear without scleral icterus. Abdomen:  +BS, soft, non-tender and non-distended. No rebound or guarding. No HSM or masses noted. Rectal: deferred Msk: Normal posture. Weak gait, using single point cane.  Extremities:  Without edema. Neurologic:  Alert and  oriented x4 Psych:  Alert and cooperative. Normal mood and affect.  Assessment & Plan  GURVEER COLUCCI is a 78 y.o. male presenting today for follow-up with no complaints     Gastroesophageal reflux disease Well-controlled with pantoprazole . No breakthrough symptoms reported. He has reduced the dose from two tablets daily to one tablet daily and is satisfied with the current regimen. No nausea, vomiting, or dysphagia reported. History of perforated duodenal ulcer in 2021 without follow up EGD to document healing.  - Continue pantoprazole  40 mg once daily, he declined dose reduction today.  - Refilled pantoprazole  prescription for one year. - GERD diet  IDA Iron deficiency anemia is well-managed with normal  hemoglobin levels  (14.4) and stable iron panel (iron 72, saturation 22%, ferritin 140) as of August 2025. No oral iron supplementation is currently being taken. Colonoscopy up to date as of 2021 with single polyp removed and non bleeding internal hemorrhoids. No complaints of melena or hematochezia. He is under the care of hematology for ongoing management but has not needed any iron supplementation in some time.  - Continue current management without oral iron supplementation and follow up with hematology as he wishes.  - Monitor for signs of recurrent GI bleed.      Follow up   Follow up 1 year.    Charmaine Melia, MSN, FNP-BC, AGACNP-BC [facility]

## 2024-07-08 ENCOUNTER — Encounter: Payer: Self-pay | Admitting: Gastroenterology

## 2024-07-08 ENCOUNTER — Ambulatory Visit: Admitting: Gastroenterology

## 2024-07-08 VITALS — BP 117/76 | HR 61 | Temp 97.6°F | Ht 72.0 in | Wt 197.2 lb

## 2024-07-08 DIAGNOSIS — K219 Gastro-esophageal reflux disease without esophagitis: Secondary | ICD-10-CM

## 2024-07-08 DIAGNOSIS — D509 Iron deficiency anemia, unspecified: Secondary | ICD-10-CM

## 2024-07-08 DIAGNOSIS — Z8719 Personal history of other diseases of the digestive system: Secondary | ICD-10-CM | POA: Diagnosis not present

## 2024-07-08 MED ORDER — PANTOPRAZOLE SODIUM 40 MG PO TBEC: 40.0000 mg | DELAYED_RELEASE_TABLET | Freq: Every day | ORAL | 3 refills | Status: DC

## 2024-07-08 NOTE — Patient Instructions (Signed)
 I sent in a refill of your pantoprazole  for you today.  You should continue to take one 40 mg tablet daily.  I provided you 90-day supply of medication with enough refills for 1 year.  Follow a GERD diet:  Avoid fried, fatty, greasy, spicy, citrus foods. Avoid caffeine and carbonated beverages. Avoid chocolate. Try eating 4-6 small meals a day rather than 3 large meals. Do not eat within 3 hours of laying down. Prop head of bed up on wood or bricks to create a 6 inch incline.   Continue to monitor for black/tarry stools or bright red blood in your stool, increased shortness of breath, chest pain, pale skin as these are signs of potential bleeding and anemia.  Follow-up in 1 year, sooner if needed!  I hope you have a Gertha Christmas and a happy new year!  I want to create trusting relationships with patients. If you receive a survey regarding your visit,  I greatly appreciate you taking time to fill this out on paper or through your MyChart. I value your feedback.  Charmaine Melia, MSN, FNP-BC, AGACNP-BC [facility]

## 2024-07-12 ENCOUNTER — Other Ambulatory Visit: Payer: Self-pay | Admitting: Gastroenterology

## 2024-07-12 MED ORDER — PANTOPRAZOLE SODIUM 40 MG PO TBEC: 40.0000 mg | DELAYED_RELEASE_TABLET | Freq: Every day | ORAL | 3 refills | Status: AC

## 2024-07-20 ENCOUNTER — Ambulatory Visit

## 2024-07-22 ENCOUNTER — Ambulatory Visit: Admitting: *Deleted

## 2024-07-22 DIAGNOSIS — Z5181 Encounter for therapeutic drug level monitoring: Secondary | ICD-10-CM | POA: Diagnosis present

## 2024-07-22 DIAGNOSIS — I513 Intracardiac thrombosis, not elsewhere classified: Secondary | ICD-10-CM | POA: Insufficient documentation

## 2024-07-22 DIAGNOSIS — I6350 Cerebral infarction due to unspecified occlusion or stenosis of unspecified cerebral artery: Secondary | ICD-10-CM | POA: Diagnosis present

## 2024-07-22 LAB — POCT INR: INR: 2.7 (ref 2.0–3.0)

## 2024-07-22 NOTE — Patient Instructions (Signed)
Continue warfarin 1 tablet daily except 1 1/2 tablets on Mondays, Wednesdays and Fridays Recheck in 6 weeks   

## 2024-07-22 NOTE — Progress Notes (Signed)
 INR 2.7; Please see anticoagulation encounter

## 2024-09-06 ENCOUNTER — Ambulatory Visit

## 2024-09-08 ENCOUNTER — Ambulatory Visit

## 2024-09-08 DIAGNOSIS — I6350 Cerebral infarction due to unspecified occlusion or stenosis of unspecified cerebral artery: Secondary | ICD-10-CM

## 2024-09-08 DIAGNOSIS — Z5181 Encounter for therapeutic drug level monitoring: Secondary | ICD-10-CM

## 2024-09-08 DIAGNOSIS — I513 Intracardiac thrombosis, not elsewhere classified: Secondary | ICD-10-CM

## 2024-09-08 LAB — POCT INR: INR: 2.3 (ref 2.0–3.0)

## 2024-09-08 NOTE — Patient Instructions (Signed)
Continue warfarin 1 tablet daily except 1 1/2 tablets on Mondays, Wednesdays and Fridays Recheck in 6 weeks   

## 2024-09-08 NOTE — Progress Notes (Signed)
 INR 2.3

## 2024-09-09 ENCOUNTER — Ambulatory Visit: Admitting: Cardiology

## 2024-09-09 ENCOUNTER — Encounter: Payer: Self-pay | Admitting: Cardiology

## 2024-09-09 VITALS — BP 124/80 | HR 70 | Ht 72.0 in | Wt 195.2 lb

## 2024-09-09 DIAGNOSIS — E782 Mixed hyperlipidemia: Secondary | ICD-10-CM | POA: Diagnosis not present

## 2024-09-09 DIAGNOSIS — N1832 Chronic kidney disease, stage 3b: Secondary | ICD-10-CM

## 2024-09-09 DIAGNOSIS — I513 Intracardiac thrombosis, not elsewhere classified: Secondary | ICD-10-CM

## 2024-09-09 DIAGNOSIS — I5020 Unspecified systolic (congestive) heart failure: Secondary | ICD-10-CM

## 2024-09-09 NOTE — Progress Notes (Signed)
"  ° ° °  Cardiology Office Note  Date: 09/09/2024   ID: Dennis Yu, Dennis Yu 11/20/1945, MRN [id number]  History of Present Illness: Dennis Yu is a 79 y.o. male last seen in June 2025.  He is here for a routine visit, doing well with NYHA class II dyspnea, stable weight and no fluid retention, no angina.  He does not report any change in functional capacity since last visit.  No palpitations or syncope.  He continues to follow with the anticoagulation clinic, recent INR 2.3.  Reports no spontaneous bleeding problems on Coumadin .  We went over the remainder of his cardiac regimen which is stable.  He had lab work in November 2025 which I also reviewed.  Physical Exam: VS:  BP 124/80   Pulse 70   Ht 6' (1.829 m)   Wt 195 lb 3.2 oz (88.5 kg)   SpO2 96%   BMI 26.47 kg/m , BMI Body mass index is 26.47 kg/m.  Wt Readings from Last 3 Encounters:  09/09/24 195 lb 3.2 oz (88.5 kg)  07/08/24 197 lb 3.2 oz (89.4 kg)  03/25/24 196 lb 6.9 oz (89.1 kg)    General: Patient appears comfortable at rest. HEENT: Conjunctiva and lids normal. Neck: Supple, no elevated JVP or carotid bruits. Lungs: Clear to auscultation, nonlabored breathing at rest. Cardiac: Regular rate and rhythm, no S3, 2/6 systolic murmur. Extremities: No pitting edema.  ECG:  An ECG dated 01/20/2024 was personally reviewed today and demonstrated:  Sinus rhythm with PVC, old inferior and anterolateral infarct pattern.  Labwork: 03/18/2024: ALT 31; AST 30; BUN 25; Creatinine, Ser 1.55; Hemoglobin 14.4; Platelets 178; Potassium 4.5; Sodium 138  November 2025: Hemoglobin 14.9, platelets 194, BUN 21, creatinine 1.59, GFR 45, potassium 4.7, AST 32, ALT 26, cholesterol 114, triglycerides 85, HDL 31, LDL 66, hemoglobin A1c 6.3%  Other Studies Reviewed Today:  No interval cardiac testing for review today.  Assessment and Plan:  1.  HFrEF with ischemic cardiomyopathy, LVEF 30 to 35% by echocardiogram in December 2024.  He has  declined ICD and prefers conservative medical therapy.  Clinically stable with NYHA class II dyspnea and no fluid retention.  Continue medical therapy including Coreg  6.25 mg twice daily, Farxiga 10 mg daily, Entresto  24/26 mg twice daily, and Lasix  40 mg daily with an additional 20 mg dose for intermittent fluid retention (he has not used any extra diuretics since last visit).  Have held off MRA given fluctuating renal insufficiency.   2.  History of stroke and calcified apical LV mural thrombus.  He continues on Coumadin  with follow-up in anticoagulation clinic.  Recent INR therapeutic, no spontaneous bleeding problems reported.   3.  CKD stage IIIb, creatinine 1.59 with GFR 45 in November 2025.   4.  Mixed hyperlipidemia, LDL 66 in November 2025.  Continue Lipitor 40 mg daily.  Disposition:  Follow up 6 months.  Signed, Jayson JUDITHANN Sierras, M.D., F.A.C.C. [facility] at [facility] "

## 2024-09-09 NOTE — Patient Instructions (Addendum)
Medication Instructions:  Your physician recommends that you continue on your current medications as directed. Please refer to the Current Medication list given to you today.  Labwork: none  Testing/Procedures: none  Follow-Up: Your physician recommends that you schedule a follow-up appointment in: 6 month  Any Other Special Instructions Will Be Listed Below (If Applicable).  If you need a refill on your cardiac medications before your next appointment, please call your pharmacy. 

## 2024-10-20 ENCOUNTER — Ambulatory Visit

## 2025-03-17 ENCOUNTER — Other Ambulatory Visit

## 2025-03-24 ENCOUNTER — Ambulatory Visit: Admitting: Oncology

## 2025-07-14 ENCOUNTER — Ambulatory Visit: Admitting: Gastroenterology
